# Patient Record
Sex: Female | Born: 1935 | Race: White | Hispanic: No | Smoking: Former smoker
Health system: Southern US, Community
[De-identification: ages and names within clinical notes are randomized; demographics above are authoritative.]

## PROBLEM LIST (undated history)

## (undated) DIAGNOSIS — N3010 Interstitial cystitis (chronic) without hematuria: Secondary | ICD-10-CM

## (undated) DIAGNOSIS — I482 Chronic atrial fibrillation, unspecified: Secondary | ICD-10-CM

## (undated) DIAGNOSIS — K219 Gastro-esophageal reflux disease without esophagitis: Secondary | ICD-10-CM

## (undated) DIAGNOSIS — K900 Celiac disease: Secondary | ICD-10-CM

## (undated) DIAGNOSIS — I10 Essential (primary) hypertension: Secondary | ICD-10-CM

## (undated) DIAGNOSIS — H9190 Unspecified hearing loss, unspecified ear: Secondary | ICD-10-CM

## (undated) DIAGNOSIS — K5909 Other constipation: Secondary | ICD-10-CM

## (undated) DIAGNOSIS — I5022 Chronic systolic (congestive) heart failure: Secondary | ICD-10-CM

## (undated) HISTORY — PX: OTHER SURGICAL HISTORY: SHX169

## (undated) HISTORY — PX: ROTATOR CUFF REPAIR: SHX139

## (undated) HISTORY — PX: ABDOMINAL HYSTERECTOMY: SHX81

## (undated) HISTORY — PX: TONSILLECTOMY: SUR1361

## (undated) HISTORY — PX: CHOLECYSTECTOMY: SHX55

## (undated) HISTORY — PX: APPENDECTOMY: SHX54

## (undated) HISTORY — PX: REPLACEMENT TOTAL KNEE: SUR1224

---

## 1997-08-27 ENCOUNTER — Ambulatory Visit (HOSPITAL_COMMUNITY): Admission: RE | Admit: 1997-08-27 | Discharge: 1997-08-27 | Payer: Self-pay | Admitting: Internal Medicine

## 1998-07-10 ENCOUNTER — Ambulatory Visit (HOSPITAL_COMMUNITY): Admission: RE | Admit: 1998-07-10 | Discharge: 1998-07-10 | Payer: Self-pay | Admitting: Obstetrics & Gynecology

## 1998-11-26 ENCOUNTER — Ambulatory Visit (HOSPITAL_COMMUNITY): Admission: RE | Admit: 1998-11-26 | Discharge: 1998-11-26 | Payer: Self-pay | Admitting: Obstetrics and Gynecology

## 1998-11-26 ENCOUNTER — Encounter: Payer: Self-pay | Admitting: Obstetrics and Gynecology

## 1999-12-09 ENCOUNTER — Encounter: Payer: Self-pay | Admitting: Internal Medicine

## 1999-12-09 ENCOUNTER — Ambulatory Visit (HOSPITAL_COMMUNITY): Admission: RE | Admit: 1999-12-09 | Discharge: 1999-12-09 | Payer: Self-pay | Admitting: Internal Medicine

## 2000-12-28 ENCOUNTER — Other Ambulatory Visit: Admission: RE | Admit: 2000-12-28 | Discharge: 2000-12-28 | Payer: Self-pay | Admitting: *Deleted

## 2012-05-01 ENCOUNTER — Emergency Department (HOSPITAL_BASED_OUTPATIENT_CLINIC_OR_DEPARTMENT_OTHER)
Admission: EM | Admit: 2012-05-01 | Discharge: 2012-05-01 | Disposition: A | Discharge status: Home / Self Care | Payer: Medicare Other | Attending: Emergency Medicine | Admitting: Emergency Medicine

## 2012-05-01 ENCOUNTER — Encounter (HOSPITAL_BASED_OUTPATIENT_CLINIC_OR_DEPARTMENT_OTHER): Payer: Self-pay | Admitting: *Deleted

## 2012-05-01 ENCOUNTER — Emergency Department (HOSPITAL_BASED_OUTPATIENT_CLINIC_OR_DEPARTMENT_OTHER): Payer: Medicare Other

## 2012-05-01 DIAGNOSIS — S82009A Unspecified fracture of unspecified patella, initial encounter for closed fracture: Secondary | ICD-10-CM | POA: Insufficient documentation

## 2012-05-01 DIAGNOSIS — W010XXA Fall on same level from slipping, tripping and stumbling without subsequent striking against object, initial encounter: Secondary | ICD-10-CM | POA: Insufficient documentation

## 2012-05-01 DIAGNOSIS — Y929 Unspecified place or not applicable: Secondary | ICD-10-CM | POA: Insufficient documentation

## 2012-05-01 DIAGNOSIS — I10 Essential (primary) hypertension: Secondary | ICD-10-CM | POA: Insufficient documentation

## 2012-05-01 DIAGNOSIS — Z7901 Long term (current) use of anticoagulants: Secondary | ICD-10-CM | POA: Insufficient documentation

## 2012-05-01 DIAGNOSIS — E119 Type 2 diabetes mellitus without complications: Secondary | ICD-10-CM | POA: Insufficient documentation

## 2012-05-01 DIAGNOSIS — T45515A Adverse effect of anticoagulants, initial encounter: Secondary | ICD-10-CM | POA: Insufficient documentation

## 2012-05-01 DIAGNOSIS — IMO0002 Reserved for concepts with insufficient information to code with codable children: Secondary | ICD-10-CM

## 2012-05-01 DIAGNOSIS — Z79899 Other long term (current) drug therapy: Secondary | ICD-10-CM | POA: Insufficient documentation

## 2012-05-01 DIAGNOSIS — M25469 Effusion, unspecified knee: Secondary | ICD-10-CM | POA: Insufficient documentation

## 2012-05-01 DIAGNOSIS — Z96659 Presence of unspecified artificial knee joint: Secondary | ICD-10-CM | POA: Insufficient documentation

## 2012-05-01 DIAGNOSIS — M2540 Effusion, unspecified joint: Secondary | ICD-10-CM

## 2012-05-01 DIAGNOSIS — D689 Coagulation defect, unspecified: Secondary | ICD-10-CM | POA: Insufficient documentation

## 2012-05-01 DIAGNOSIS — D6832 Hemorrhagic disorder due to extrinsic circulating anticoagulants: Secondary | ICD-10-CM

## 2012-05-01 DIAGNOSIS — Y939 Activity, unspecified: Secondary | ICD-10-CM | POA: Insufficient documentation

## 2012-05-01 DIAGNOSIS — K219 Gastro-esophageal reflux disease without esophagitis: Secondary | ICD-10-CM | POA: Insufficient documentation

## 2012-05-01 HISTORY — DX: Essential (primary) hypertension: I10

## 2012-05-01 HISTORY — DX: Gastro-esophageal reflux disease without esophagitis: K21.9

## 2012-05-01 LAB — PROTIME-INR
INR: 2.21 — ABNORMAL HIGH (ref 0.00–1.49)
Prothrombin Time: 23.6 seconds — ABNORMAL HIGH (ref 11.6–15.2)

## 2012-05-01 LAB — CBC
HCT: 34.3 % — ABNORMAL LOW (ref 36.0–46.0)
Hemoglobin: 11.6 g/dL — ABNORMAL LOW (ref 12.0–15.0)
MCH: 30 pg (ref 26.0–34.0)
MCHC: 33.8 g/dL (ref 30.0–36.0)
MCV: 88.6 fL (ref 78.0–100.0)
Platelets: 195 10*3/uL (ref 150–400)
RBC: 3.87 MIL/uL (ref 3.87–5.11)
RDW: 14.4 % (ref 11.5–15.5)
WBC: 5.8 10*3/uL (ref 4.0–10.5)

## 2012-05-01 MED ORDER — OXYCODONE HCL 5 MG PO CAPS
5.0000 mg | ORAL_CAPSULE | Freq: Four times a day (QID) | ORAL | Status: DC | PRN
Start: 2012-05-01 — End: 2016-07-29

## 2012-05-01 MED ORDER — OXYCODONE-ACETAMINOPHEN 5-325 MG PO TABS
1.0000 | ORAL_TABLET | Freq: Once | ORAL | Status: AC
  Administered 2012-05-01: 1 via ORAL
  Filled 2012-05-01 (×2): qty 1

## 2012-05-01 NOTE — ED Notes (Signed)
Slipped and fell, now having left knee pain and swelling

## 2012-05-01 NOTE — ED Provider Notes (Signed)
History   This chart was scribed for Saddie Benders. Dorna Mai, MD by Adriana Reams, ED Scribe. This patient was seen in room MH12/MH12 and the patient's care was started at 1625.   CSN: [id number]  Arrival date & time 05/01/12  1532   First MD Initiated Contact with Patient 05/01/12 1625      Chief Complaint  Patient presents with  . Fall     The history is provided by the patient and a relative. No language interpreter was used.   Amy Hickman is a 77 y.o. female who presents to the Emergency Department complaining of constant, moderate, sudden onset left knee pain due to a fall which occurred earlier this AM.  It is worse with bending.  No medications were taken prior to arrival.  No LOC. She reports associated swelling and tenderness. She denies hitting her head or any other pain. 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 She has a history of HTN, DM and GERD. She has a Coumadin level recheck 4 weeks ago and has one schedules for 2012/06/06. Her husband has passed away in the past week.  Past Medical History  Diagnosis Date  . Diabetes mellitus without complication   . Hypertension   . GERD (gastroesophageal reflux disease)     Past Surgical History  Procedure Date  . Replacement total knee     History reviewed. No pertinent family history.  History  Substance Use Topics  . Smoking status: Never Smoker   . Smokeless tobacco: Not on file  . Alcohol Use: No    OB History    Grav Para Term Preterm Abortions TAB SAB Ect Mult Living                  Review of Systems  Constitutional: Negative.   Musculoskeletal: Positive for joint swelling and arthralgias (left knee).  Skin: Negative for wound.  Neurological: Negative for dizziness, syncope, weakness and light-headedness.  Hematological: Bruises/bleeds easily.    Allergies  Ativan; Ciprofloxacin hcl; Codeine; Darvon; Gluten meal; and Phenergan  Home Medications   Current Outpatient Rx  Name  Route  Sig   Dispense  Refill  . AMLODIPINE BESY-BENAZEPRIL HCL 5-10 MG PO CAPS   Oral   Take 1 capsule by mouth daily.         . CHLORDIAZEPOXIDE HCL 25 MG PO CAPS   Oral   Take 25 mg by mouth 3 (three) times daily as needed.         Marland Kitchen ESOMEPRAZOLE MAGNESIUM 40 MG PO CPDR   Oral   Take 40 mg by mouth daily before breakfast.         . LEVOTHYROXINE SODIUM 100 MCG PO TABS   Oral   Take 100 mcg by mouth daily.         Marland Kitchen LOSARTAN POTASSIUM 100 MG PO TABS   Oral   Take 100 mg by mouth daily.         Marland Kitchen METOPROLOL TARTRATE 50 MG PO TABS   Oral   Take 50 mg by mouth 2 (two) times daily.         . MOMETASONE FUROATE 50 MCG/ACT NA SUSP   Nasal   Place 2 sprays into the nose daily.         Marland Kitchen PENTOSAN POLYSULFATE SODIUM 100 MG PO CAPS   Oral   Take 100 mg by mouth 3 (three) times daily before meals.         Marland Kitchen  PRAVASTATIN SODIUM 20 MG PO TABS   Oral   Take 20 mg by mouth daily.         Marland Kitchen RISEDRONATE SODIUM 150 MG PO TABS   Oral   Take 150 mg by mouth every 30 (thirty) days. with water on empty stomach, nothing by mouth or lie down for next 30 minutes.         . TRAVOPROST (BAK FREE) 0.004 % OP SOLN      1 drop at bedtime.         . WARFARIN SODIUM 7.5 MG PO TABS   Oral   Take 7.5 mg by mouth daily.         . OXYCODONE HCL 5 MG PO CAPS   Oral   Take 1 capsule (5 mg total) by mouth every 6 (six) hours as needed.   20 capsule   0     Triage Vitals: BP 140/72  Pulse 66  Temp 97.7 F (36.5 C) (Oral)  Resp 18  SpO2 100%  Physical Exam  Nursing note and vitals reviewed. Constitutional: She is oriented to person, place, and time. She appears well-developed and well-nourished. No distress.  HENT:  Head: Normocephalic and atraumatic.  Eyes: EOM are normal.  Neck: Normal range of motion. Neck supple. No tracheal deviation present.  Cardiovascular: Normal rate.   Pulses:      Dorsalis pedis pulses are 1+ on the left side.       Posterior tibial pulses are  1+ on the left side.  Pulmonary/Chest: Effort normal. No respiratory distress.  Musculoskeletal: She exhibits edema (left knee) and tenderness.       Left knee: She exhibits decreased range of motion, swelling and effusion. tenderness found.       Diffusely tender and mild effusion to left knee. No obvious deformity. Distal strength, sensation, pulses are all normal.  Neurological: She is alert and oriented to person, place, and time.  Skin: Skin is warm and dry. She is not diaphoretic.  Psychiatric: She has a normal mood and affect. Her behavior is normal.    ED Course  Procedures (including critical care time) DIAGNOSTIC STUDIES: Oxygen Saturation is 100% on room air, normal by my interpretation.    COORDINATION OF CARE: 4:40 PM Discussed treatment plan which includes rest, an knee immobilizer, and pain medication with pt at bedside and pt agreed to plan.     Labs Reviewed  PROTIME-INR - Abnormal; Notable for the following:    Prothrombin Time 23.6 (*)     INR 2.21 (*)     All other components within normal limits  CBC - Abnormal; Notable for the following:    Hemoglobin 11.6 (*)     HCT 34.3 (*)     All other components within normal limits   Dg Knee Complete 4 Views Left  05/01/2012  *RADIOLOGY REPORT*  Clinical Data: Left knee pain secondary to a fall.  Prior total knee replacement.  LEFT KNEE - COMPLETE 4+ VIEW  Comparison: None.  Findings: There is a prominent joint effusion. On one oblique view there appears to be a small avulsion of bone from the posterior aspect lateral aspect of the metaphysis just above the prosthesis. No other visible fractures.  IMPRESSION: Joint effusion. Focal fracture of the lateral aspect of the metaphysis.   Original Report Authenticated By: Lorriane Shire, M.D.      1. Knee fracture, left   2. Effusion into joint   3. Warfarin-induced coagulopathy  Patient felt improved after oral analgesics. INR is therapeutic but on the low and at 2.21.  Patient and family are in agreement with plan to be discharged, with immobilization. She has a wheelchair at home and that she will followup with her orthopedist first thing tomorrow for reevaluation of her knee.  MDM  I personally performed the services described in this documentation, which was scribed in my presence. The recorded information has been reviewed and considered.   Pt with direct force to left knee from mechanical fall.  Effusion noted, pt is on coumadin.  Limited ROM due to pain.  Will check Hgb and INR.  Will compress with ACE wrap, immobilizer, refer to follow up with her orthopedist on Monday for a recheck.  Would recommend stopping coumadin for now until re-evaluation in case arthrocentesis would be needed.          Saddie Benders. Marissa Lowrey, MD 05/01/12 2311

## 2012-06-24 ENCOUNTER — Emergency Department (HOSPITAL_BASED_OUTPATIENT_CLINIC_OR_DEPARTMENT_OTHER): Payer: Medicare Other

## 2012-06-24 ENCOUNTER — Encounter (HOSPITAL_BASED_OUTPATIENT_CLINIC_OR_DEPARTMENT_OTHER): Payer: Self-pay | Admitting: *Deleted

## 2012-06-24 ENCOUNTER — Emergency Department (HOSPITAL_BASED_OUTPATIENT_CLINIC_OR_DEPARTMENT_OTHER)
Admission: EM | Admit: 2012-06-24 | Discharge: 2012-06-24 | Disposition: A | Discharge status: Home / Self Care | Payer: Medicare Other | Attending: Emergency Medicine | Admitting: Emergency Medicine

## 2012-06-24 DIAGNOSIS — Z86718 Personal history of other venous thrombosis and embolism: Secondary | ICD-10-CM | POA: Insufficient documentation

## 2012-06-24 DIAGNOSIS — M25522 Pain in left elbow: Secondary | ICD-10-CM

## 2012-06-24 DIAGNOSIS — K219 Gastro-esophageal reflux disease without esophagitis: Secondary | ICD-10-CM | POA: Insufficient documentation

## 2012-06-24 DIAGNOSIS — E119 Type 2 diabetes mellitus without complications: Secondary | ICD-10-CM | POA: Insufficient documentation

## 2012-06-24 DIAGNOSIS — I10 Essential (primary) hypertension: Secondary | ICD-10-CM | POA: Insufficient documentation

## 2012-06-24 DIAGNOSIS — Z79899 Other long term (current) drug therapy: Secondary | ICD-10-CM | POA: Insufficient documentation

## 2012-06-24 DIAGNOSIS — M25529 Pain in unspecified elbow: Secondary | ICD-10-CM | POA: Insufficient documentation

## 2012-06-24 DIAGNOSIS — Z86711 Personal history of pulmonary embolism: Secondary | ICD-10-CM | POA: Insufficient documentation

## 2012-06-24 DIAGNOSIS — Z7901 Long term (current) use of anticoagulants: Secondary | ICD-10-CM | POA: Insufficient documentation

## 2012-06-24 LAB — CBC WITH DIFFERENTIAL/PLATELET
Basophils Absolute: 0 10*3/uL (ref 0.0–0.1)
HCT: 34.3 % — ABNORMAL LOW (ref 36.0–46.0)
Lymphocytes Relative: 29 % (ref 12–46)
Monocytes Absolute: 0.6 10*3/uL (ref 0.1–1.0)
Neutro Abs: 2.6 10*3/uL (ref 1.7–7.7)
RBC: 3.94 MIL/uL (ref 3.87–5.11)
RDW: 14.4 % (ref 11.5–15.5)
WBC: 4.7 10*3/uL (ref 4.0–10.5)

## 2012-06-24 LAB — PROTIME-INR
INR: 1.99 — ABNORMAL HIGH (ref 0.00–1.49)
Prothrombin Time: 21.8 seconds — ABNORMAL HIGH (ref 11.6–15.2)

## 2012-06-24 LAB — BASIC METABOLIC PANEL
CO2: 23 mEq/L (ref 19–32)
Chloride: 96 mEq/L (ref 96–112)
Creatinine, Ser: 0.8 mg/dL (ref 0.50–1.10)
Sodium: 130 mEq/L — ABNORMAL LOW (ref 135–145)

## 2012-06-24 MED ORDER — HYDROMORPHONE HCL PF 1 MG/ML IJ SOLN
1.0000 mg | Freq: Once | INTRAMUSCULAR | Status: AC
  Administered 2012-06-24: 1 mg via INTRAMUSCULAR
  Filled 2012-06-24: qty 1

## 2012-06-24 MED ORDER — ONDANSETRON HCL 4 MG/2ML IJ SOLN
INTRAMUSCULAR | Status: AC
  Administered 2012-06-24: 4 mg
  Filled 2012-06-24: qty 2

## 2012-06-24 MED ORDER — ACETAMINOPHEN 500 MG PO TABS: 500.0000 mg | ORAL_TABLET | Freq: Four times a day (QID) | ORAL | Status: AC | PRN

## 2012-06-24 MED ORDER — ONDANSETRON HCL 4 MG/2ML IJ SOLN
4.0000 mg | Freq: Once | INTRAMUSCULAR | Status: AC
  Administered 2012-06-24: 4 mg via INTRAVENOUS
  Filled 2012-06-24: qty 2

## 2012-06-24 NOTE — ED Notes (Signed)
MD at bedside. 

## 2012-06-24 NOTE — ED Provider Notes (Signed)
History     CSN: [id number]  Arrival date & time 06/24/12  1007   First MD Initiated Contact with Patient 06/24/12 1039      Chief Complaint  Patient presents with  . Elbow Pain    (Consider location/radiation/quality/duration/timing/severity/associated sxs/prior treatment) HPI The patient presents with acute onset of pain in her left elbow.  She recalls going to sleep last night in her usual state of health.  She awoke, approximately 2 hours prior to arrival with pain focally about the left anterior elbow.  The pain is worse with motion.  There is associated distal dysesthesia, but no weakness.  The pain is worse with wrist motion, elbow or motion. No new shoulder pain, no other new focal complaints. The patient's that she has been compliant indication.  Initially the patient denied other significant medical issues, but on my repeat evaluation the patient states that she has a history of DVT/PE. Past Medical History  Diagnosis Date  . Diabetes mellitus without complication   . Hypertension   . GERD (gastroesophageal reflux disease)     Past Surgical History  Procedure Laterality Date  . Replacement total knee      History reviewed. No pertinent family history.  History  Substance Use Topics  . Smoking status: Never Smoker   . Smokeless tobacco: Not on file  . Alcohol Use: No    OB History   Grav Para Term Preterm Abortions TAB SAB Ect Mult Living                  Review of Systems  Constitutional:       Per HPI, otherwise negative  HENT:       Per HPI, otherwise negative  Respiratory:       Per HPI, otherwise negative  Cardiovascular:       Per HPI, otherwise negative  Gastrointestinal: Negative for vomiting.  Endocrine:       Negative aside from HPI  Genitourinary:       Neg aside from HPI   Musculoskeletal:       Per HPI, otherwise negative  Skin: Negative.   Neurological: Positive for numbness. Negative for syncope, weakness and headaches.     Allergies  Ativan; Ciprofloxacin hcl; Codeine; Darvon; Gluten meal; and Phenergan  Home Medications   Current Outpatient Rx  Name  Route  Sig  Dispense  Refill  . amLODipine-benazepril (LOTREL) 5-10 MG per capsule   Oral   Take 1 capsule by mouth daily.         . chlordiazePOXIDE (LIBRIUM) 25 MG capsule   Oral   Take 25 mg by mouth 3 (three) times daily as needed.         Marland Kitchen esomeprazole (NEXIUM) 40 MG capsule   Oral   Take 40 mg by mouth daily before breakfast.         . levothyroxine (SYNTHROID, LEVOTHROID) 100 MCG tablet   Oral   Take 100 mcg by mouth daily.         Marland Kitchen losartan (COZAAR) 100 MG tablet   Oral   Take 100 mg by mouth daily.         . metoprolol (LOPRESSOR) 50 MG tablet   Oral   Take 50 mg by mouth 2 (two) times daily.         . mometasone (NASONEX) 50 MCG/ACT nasal spray   Nasal   Place 2 sprays into the nose daily.         Marland Kitchen  oxycodone (OXY-IR) 5 MG capsule   Oral   Take 1 capsule (5 mg total) by mouth every 6 (six) hours as needed.   20 capsule   0   . pentosan polysulfate (ELMIRON) 100 MG capsule   Oral   Take 100 mg by mouth 3 (three) times daily before meals.         . pravastatin (PRAVACHOL) 20 MG tablet   Oral   Take 20 mg by mouth daily.         . risedronate (ACTONEL) 150 MG tablet   Oral   Take 150 mg by mouth every 30 (thirty) days. with water on empty stomach, nothing by mouth or lie down for next 30 minutes.         . Travoprost, BAK Free, (TRAVATAN) 0.004 % SOLN ophthalmic solution      1 drop at bedtime.         Marland Kitchen warfarin (COUMADIN) 7.5 MG tablet   Oral   Take 7.5 mg by mouth daily.           SpO2 98%  Physical Exam  Nursing note and vitals reviewed. Constitutional: She is oriented to person, place, and time. She appears well-developed and well-nourished. No distress.  HENT:  Head: Normocephalic and atraumatic.  Eyes: Conjunctivae and EOM are normal.  Cardiovascular: Normal rate and  regular rhythm.   Pulmonary/Chest: Effort normal and breath sounds normal. No stridor. No respiratory distress.  Abdominal: She exhibits no distension.  Musculoskeletal: She exhibits no edema.       Left shoulder: Normal.       Left wrist: Normal.       Arms: Neurological: She is alert and oriented to person, place, and time. No cranial nerve deficit.  Skin: Skin is warm and dry.  Psychiatric: She has a normal mood and affect.    ED Course  Procedures (including critical care time)  Labs Reviewed  CBC WITH DIFFERENTIAL  BASIC METABOLIC PANEL  PROTIME-INR   Dg Elbow Complete Left  06/24/2012  *RADIOLOGY REPORT*  Clinical Data: Awoke with lateral left elbow pain, no known injury  LEFT ELBOW - COMPLETE 3+ VIEW  Comparison: None  Findings: Mild osseous demineralization. Joint spaces preserved. No acute fracture, dislocation or bone destruction. No elbow joint effusion or definite soft tissue abnormality.  IMPRESSION: No acute osseous abnormalities.   Original Report Authenticated By: Lavonia Dana, M.D.    X-ray interpreted by me.  No diagnosis found.  Upon return from x-ray the patient had an episode of emesis.  Zofran provided.  Update: patient w no new complaints.  I discussed all results and the need to follow up with her orthopedist promptly.  We also discussed return precautions.  MDM  This patient presents with new left elbow pain.  The patient has some subjective sensory changes, but objectively is neurovascularly intact throughout her left upper extremity.  The patient's evaluation does not demonstrate additional fracture or DVT.  Absent warmth, fever, and with no restricted range of motion, there is low suspicion for septic arthropathy.  Etiology the patient's pain is unclear, and requires further evaluation, but she is appropriate to do this as an outpatient.  I discussed this with patient and her son prior to discharge.        [name], MD 06/24/12 [phone]

## 2012-06-24 NOTE — ED Notes (Signed)
Patient transported to Ultrasound 

## 2012-06-24 NOTE — ED Notes (Signed)
Woke up with pain in left elbow also states her hand and fingers are tingling hand is warm pink and dry pulses felt pt denies any known injury

## 2012-06-24 NOTE — ED Notes (Signed)
Pt returned to ED room 4 and vomiting.

## 2012-06-24 NOTE — ED Notes (Signed)
Family at bedside. 

## 2012-10-20 ENCOUNTER — Emergency Department (HOSPITAL_BASED_OUTPATIENT_CLINIC_OR_DEPARTMENT_OTHER)
Admission: EM | Admit: 2012-10-20 | Discharge: 2012-10-20 | Disposition: A | Discharge status: Home / Self Care | Payer: Medicare Other | Attending: Emergency Medicine | Admitting: Emergency Medicine

## 2012-10-20 ENCOUNTER — Encounter (HOSPITAL_BASED_OUTPATIENT_CLINIC_OR_DEPARTMENT_OTHER): Payer: Self-pay | Admitting: *Deleted

## 2012-10-20 ENCOUNTER — Emergency Department (HOSPITAL_BASED_OUTPATIENT_CLINIC_OR_DEPARTMENT_OTHER): Payer: Medicare Other

## 2012-10-20 DIAGNOSIS — Y929 Unspecified place or not applicable: Secondary | ICD-10-CM | POA: Insufficient documentation

## 2012-10-20 DIAGNOSIS — X500XXA Overexertion from strenuous movement or load, initial encounter: Secondary | ICD-10-CM | POA: Insufficient documentation

## 2012-10-20 DIAGNOSIS — S93402A Sprain of unspecified ligament of left ankle, initial encounter: Secondary | ICD-10-CM

## 2012-10-20 DIAGNOSIS — S93409A Sprain of unspecified ligament of unspecified ankle, initial encounter: Secondary | ICD-10-CM | POA: Insufficient documentation

## 2012-10-20 DIAGNOSIS — K219 Gastro-esophageal reflux disease without esophagitis: Secondary | ICD-10-CM | POA: Insufficient documentation

## 2012-10-20 DIAGNOSIS — Y939 Activity, unspecified: Secondary | ICD-10-CM | POA: Insufficient documentation

## 2012-10-20 DIAGNOSIS — Z7901 Long term (current) use of anticoagulants: Secondary | ICD-10-CM | POA: Insufficient documentation

## 2012-10-20 DIAGNOSIS — I10 Essential (primary) hypertension: Secondary | ICD-10-CM | POA: Insufficient documentation

## 2012-10-20 DIAGNOSIS — E119 Type 2 diabetes mellitus without complications: Secondary | ICD-10-CM | POA: Insufficient documentation

## 2012-10-20 DIAGNOSIS — Z79899 Other long term (current) drug therapy: Secondary | ICD-10-CM | POA: Insufficient documentation

## 2012-10-20 MED ORDER — OXYCODONE-ACETAMINOPHEN 5-325 MG PO TABS: 1.0000 | ORAL_TABLET | ORAL | Status: DC | PRN

## 2012-10-20 MED ORDER — OXYCODONE-ACETAMINOPHEN 5-325 MG PO TABS
2.0000 | ORAL_TABLET | Freq: Once | ORAL | Status: AC
  Administered 2012-10-20: 2 via ORAL
  Filled 2012-10-20 (×2): qty 2

## 2012-10-20 NOTE — ED Notes (Signed)
Pt reports left ankle pain that began tonight, pt denies any known injury to area. No contusion, swelling, or deformity noted on assessment - CMS intact.

## 2012-10-20 NOTE — ED Notes (Signed)
Left ankle pain and swelling. Sudden onset. No known injury.

## 2012-10-20 NOTE — ED Provider Notes (Signed)
History    CSN: [id number] Arrival date & time 10/20/12  2133  First MD Initiated Contact with Patient 10/20/12 2215     Chief Complaint  Patient presents with  . Ankle Pain   (Consider location/radiation/quality/duration/timing/severity/associated sxs/prior Treatment) HPI  Patient complaining of left ankle pain began tonight when she stepped on ankle.  Patient states she had no definite twisting or fall, she did not hear any cracking.  Pain is worse on lateral aspect.  Patient unable to bear weight.  She has had increased swelling over the past three hours.  She took did not take anything for pain.  Pain is severe.  Patient is on coumadin but it has been stopped for the past week for an egd and just restarted last night.  She is on lovenox.   Past Medical History  Diagnosis Date  . Diabetes mellitus without complication   . Hypertension   . GERD (gastroesophageal reflux disease)    Past Surgical History  Procedure Laterality Date  . Replacement total knee     No family history on file. History  Substance Use Topics  . Smoking status: Never Smoker   . Smokeless tobacco: Not on file  . Alcohol Use: No   OB History   Grav Para Term Preterm Abortions TAB SAB Ect Mult Living                 Review of Systems  All other systems reviewed and are negative.    Allergies  Ativan; Ciprofloxacin hcl; Codeine; Darvon; Gluten meal; and Phenergan  Home Medications   Current Outpatient Rx  Name  Route  Sig  Dispense  Refill  . acetaminophen (TYLENOL) 500 MG tablet   Oral   Take 1 tablet (500 mg total) by mouth every 6 (six) hours as needed for pain.   30 tablet   0   . amLODipine-benazepril (LOTREL) 5-10 MG per capsule   Oral   Take 1 capsule by mouth daily.         . chlordiazePOXIDE (LIBRIUM) 25 MG capsule   Oral   Take 25 mg by mouth 3 (three) times daily as needed.         Marland Kitchen esomeprazole (NEXIUM) 40 MG capsule   Oral   Take 40 mg by mouth daily before  breakfast.         . levothyroxine (SYNTHROID, LEVOTHROID) 100 MCG tablet   Oral   Take 100 mcg by mouth daily.         Marland Kitchen losartan (COZAAR) 100 MG tablet   Oral   Take 100 mg by mouth daily.         . metoprolol (LOPRESSOR) 50 MG tablet   Oral   Take 50 mg by mouth 2 (two) times daily.         . mometasone (NASONEX) 50 MCG/ACT nasal spray   Nasal   Place 2 sprays into the nose daily.         Marland Kitchen oxycodone (OXY-IR) 5 MG capsule   Oral   Take 1 capsule (5 mg total) by mouth every 6 (six) hours as needed.   20 capsule   0   . pentosan polysulfate (ELMIRON) 100 MG capsule   Oral   Take 100 mg by mouth 3 (three) times daily before meals.         . pravastatin (PRAVACHOL) 20 MG tablet   Oral   Take 20 mg by mouth daily.         Marland Kitchen  risedronate (ACTONEL) 150 MG tablet   Oral   Take 150 mg by mouth every 30 (thirty) days. with water on empty stomach, nothing by mouth or lie down for next 30 minutes.         . Travoprost, BAK Free, (TRAVATAN) 0.004 % SOLN ophthalmic solution      1 drop at bedtime.         Marland Kitchen warfarin (COUMADIN) 7.5 MG tablet   Oral   Take 7.5 mg by mouth daily.          BP 141/74  Pulse 72  Temp(Src) 98 F (36.7 C) (Oral)  Resp 20  Ht 5' 2"  (1.575 m)  Wt 159 lb (72.122 kg)  BMI 29.07 kg/m2  SpO2 99% Physical Exam  Nursing note and vitals reviewed. Constitutional: She is oriented to person, place, and time. She appears well-developed and well-nourished.  HENT:  Head: Normocephalic and atraumatic.  Musculoskeletal:  Right ankle is diffusely tender with palpation , some mild lateral swelling.  No warmth,  Full arom.  Knee nontender dp intact .  Toes pink cap refill <2 sec  Neurological: She is alert and oriented to person, place, and time.  Skin: Skin is warm and dry.  Psychiatric: Her behavior is normal. Judgment and thought content normal.    ED Course  Procedures (including critical care time) Labs Reviewed - No data to  display No results found. No diagnosis found.  MDM  Dg Ankle Complete Left  10/20/2012   *RADIOLOGY REPORT*  Clinical Data: Ankle pain.  LEFT ANKLE COMPLETE - 3+ VIEW  Comparison: None.  Findings: Old post-traumatic changes are present adjacent to the navicular bone.  The ankle mortise is congruent.  Talar dome appears intact.  Osteopenia.  Visualized metatarsal bases appear normal.  The obliquity is present on the lateral view, producing an unusual appearance of the subtalar joints.  The appearance is probably due to obliquity rather than coalition.  IMPRESSION: No acute osseous abnormality.   Original Report Authenticated By: Dereck Ligas, M.D.  No evidence of acute fracture seen on x-Ashlea Dusing. Patient has walker at home. We have discussed that she should only weight bear as tolerated. She has a Doctor, general practice in [facility]. We'll also discussed that she should followup with her orthopedic surgeon if this is not improved in 2-3 days. Return precautions are discussed with the patient and the son and they voiced agreement. Patient will ice and elevate foot when she is at home. She is given 2 Percocet here and a prescription for 6 Percocet pills.  Shaune Pollack, MD 10/20/12 2240

## 2013-11-18 ENCOUNTER — Emergency Department (HOSPITAL_BASED_OUTPATIENT_CLINIC_OR_DEPARTMENT_OTHER): Payer: Medicare Other

## 2013-11-18 ENCOUNTER — Encounter (HOSPITAL_BASED_OUTPATIENT_CLINIC_OR_DEPARTMENT_OTHER): Payer: Self-pay | Admitting: Emergency Medicine

## 2013-11-18 ENCOUNTER — Emergency Department (HOSPITAL_BASED_OUTPATIENT_CLINIC_OR_DEPARTMENT_OTHER)
Admission: EM | Admit: 2013-11-18 | Discharge: 2013-11-18 | Disposition: A | Discharge status: Home / Self Care | Payer: Medicare Other | Attending: Emergency Medicine | Admitting: Emergency Medicine

## 2013-11-18 DIAGNOSIS — R05 Cough: Secondary | ICD-10-CM | POA: Diagnosis present

## 2013-11-18 DIAGNOSIS — E119 Type 2 diabetes mellitus without complications: Secondary | ICD-10-CM | POA: Diagnosis not present

## 2013-11-18 DIAGNOSIS — IMO0002 Reserved for concepts with insufficient information to code with codable children: Secondary | ICD-10-CM | POA: Diagnosis not present

## 2013-11-18 DIAGNOSIS — Z7901 Long term (current) use of anticoagulants: Secondary | ICD-10-CM | POA: Diagnosis not present

## 2013-11-18 DIAGNOSIS — J209 Acute bronchitis, unspecified: Secondary | ICD-10-CM | POA: Diagnosis not present

## 2013-11-18 DIAGNOSIS — R059 Cough, unspecified: Secondary | ICD-10-CM | POA: Diagnosis present

## 2013-11-18 DIAGNOSIS — I10 Essential (primary) hypertension: Secondary | ICD-10-CM | POA: Insufficient documentation

## 2013-11-18 DIAGNOSIS — R5381 Other malaise: Secondary | ICD-10-CM | POA: Insufficient documentation

## 2013-11-18 DIAGNOSIS — K219 Gastro-esophageal reflux disease without esophagitis: Secondary | ICD-10-CM | POA: Insufficient documentation

## 2013-11-18 DIAGNOSIS — R5383 Other fatigue: Secondary | ICD-10-CM

## 2013-11-18 DIAGNOSIS — J40 Bronchitis, not specified as acute or chronic: Secondary | ICD-10-CM

## 2013-11-18 DIAGNOSIS — Z79899 Other long term (current) drug therapy: Secondary | ICD-10-CM | POA: Diagnosis not present

## 2013-11-18 MED ORDER — BENZONATATE 100 MG PO CAPS: 100.0000 mg | ORAL_CAPSULE | Freq: Three times a day (TID) | ORAL | Status: DC | PRN

## 2013-11-18 MED ORDER — DOXYCYCLINE HYCLATE 100 MG PO CAPS: 100.0000 mg | ORAL_CAPSULE | Freq: Two times a day (BID) | ORAL | Status: DC

## 2013-11-18 NOTE — Discharge Instructions (Signed)
Upper Respiratory Infection, Adult An upper respiratory infection (URI) is also sometimes known as the common cold. The upper respiratory tract includes the nose, sinuses, throat, trachea, and bronchi. Bronchi are the airways leading to the lungs. Most people improve within 1 week, but symptoms can last up to 2 weeks. A residual cough may last even longer.  CAUSES Many different viruses can infect the tissues lining the upper respiratory tract. The tissues become irritated and inflamed and often become very moist. Mucus production is also common. A cold is contagious. You can easily spread the virus to others by oral contact. This includes kissing, sharing a glass, coughing, or sneezing. Touching your mouth or nose and then touching a surface, which is then touched by another person, can also spread the virus. SYMPTOMS  Symptoms typically develop 1 to 3 days after you come in contact with a cold virus. Symptoms vary from person to person. They may include:  Runny nose.  Sneezing.  Nasal congestion.  Sinus irritation.  Sore throat.  Loss of voice (laryngitis).  Cough.  Fatigue.  Muscle aches.  Loss of appetite.  Headache.  Low-grade fever. DIAGNOSIS  You might diagnose your own cold based on familiar symptoms, since most people get a cold 2 to 3 times a year. Your caregiver can confirm this based on your exam. Most importantly, your caregiver can check that your symptoms are not due to another disease such as strep throat, sinusitis, pneumonia, asthma, or epiglottitis. Blood tests, throat tests, and X-rays are not necessary to diagnose a common cold, but they may sometimes be helpful in excluding other more serious diseases. Your caregiver will decide if any further tests are required. RISKS AND COMPLICATIONS  You may be at risk for a more severe case of the common cold if you smoke cigarettes, have chronic heart disease (such as heart failure) or lung disease (such as asthma), or if  you have a weakened immune system. The very young and very old are also at risk for more serious infections. Bacterial sinusitis, middle ear infections, and bacterial pneumonia can complicate the common cold. The common cold can worsen asthma and chronic obstructive pulmonary disease (COPD). Sometimes, these complications can require emergency medical care and may be life-threatening. PREVENTION  The best way to protect against getting a cold is to practice good hygiene. Avoid oral or hand contact with people with cold symptoms. Wash your hands often if contact occurs. There is no clear evidence that vitamin C, vitamin E, echinacea, or exercise reduces the chance of developing a cold. However, it is always recommended to get plenty of rest and practice good nutrition. TREATMENT  Treatment is directed at relieving symptoms. There is no cure. Antibiotics are not effective, because the infection is caused by a virus, not by bacteria. Treatment may include:  Increased fluid intake. Sports drinks offer valuable electrolytes, sugars, and fluids.  Breathing heated mist or steam (vaporizer or shower).  Eating chicken soup or other clear broths, and maintaining good nutrition.  Getting plenty of rest.  Using gargles or lozenges for comfort.  Controlling fevers with ibuprofen or acetaminophen as directed by your caregiver.  Increasing usage of your inhaler if you have asthma. Zinc gel and zinc lozenges, taken in the first 24 hours of the common cold, can shorten the duration and lessen the severity of symptoms. Pain medicines may help with fever, muscle aches, and throat pain. A variety of non-prescription medicines are available to treat congestion and runny nose. Your caregiver   can make recommendations and may suggest nasal or lung inhalers for other symptoms.  HOME CARE INSTRUCTIONS   Only take over-the-counter or prescription medicines for pain, discomfort, or fever as directed by your  caregiver.  Use a warm mist humidifier or inhale steam from a shower to increase air moisture. This may keep secretions moist and make it easier to breathe.  Drink enough water and fluids to keep your urine clear or pale yellow.  Rest as needed.  Return to work when your temperature has returned to normal or as your caregiver advises. You may need to stay home longer to avoid infecting others. You can also use a face mask and careful hand washing to prevent spread of the virus. SEEK MEDICAL CARE IF:   After the first few days, you feel you are getting worse rather than better.  You need your caregiver's advice about medicines to control symptoms.  You develop chills, worsening shortness of breath, or brown or red sputum. These may be signs of pneumonia.  You develop yellow or brown nasal discharge or pain in the face, especially when you bend forward. These may be signs of sinusitis.  You develop a fever, swollen neck glands, pain with swallowing, or white areas in the back of your throat. These may be signs of strep throat. SEEK IMMEDIATE MEDICAL CARE IF:   You have a fever.  You develop severe or persistent headache, ear pain, sinus pain, or chest pain.  You develop wheezing, a prolonged cough, cough up blood, or have a change in your usual mucus (if you have chronic lung disease).  You develop sore muscles or a stiff neck. Document Released: 09/30/2000 Document Revised: 06/29/2011 Document Reviewed: 07/12/2013 [employer] Patient Information 2015 [employer]. This information is not intended to replace advice given to you by your health care provider. Make sure you discuss any questions you have with your health care provider.  

## 2013-11-18 NOTE — ED Notes (Signed)
Patient has a cough and sore throat, started 4 days ago.

## 2013-11-18 NOTE — ED Provider Notes (Signed)
CSN: [id number]     Arrival date & time 11/18/13  1287 History   First MD Initiated Contact with Patient 11/18/13 [phone]     Chief Complaint  Patient presents with  . Cough     (Consider location/radiation/quality/duration/timing/severity/associated sxs/prior Treatment) HPI Comments: Patient presents with cough. She has a 4 to five-day history of worsening cough and chest congestion. She states her cough is nonproductive but she feels congested in her chest. She also has some runny nose, postnasal drip and sore throat. She denies any fevers or chills. She feels generally fatigued but denies any shortness of breath or chest pain. She denies any vomiting or diarrhea. She's been taking Delsym and Mucinex without improvement in symptoms. She is on Coumadin but has recently stopped the Coumadin and to do a upcoming colonoscopy procedure. She's currently taking lovenox shots.  Patient is a 78 y.o. female presenting with cough.  Cough Associated symptoms: sore throat   Associated symptoms: no chest pain, no chills, no diaphoresis, no fever, no headaches, no rash, no rhinorrhea and no shortness of breath     Past Medical History  Diagnosis Date  . Diabetes mellitus without complication   . Hypertension   . GERD (gastroesophageal reflux disease)    Past Surgical History  Procedure Laterality Date  . Replacement total knee     No family history on file. History  Substance Use Topics  . Smoking status: Never Smoker   . Smokeless tobacco: Not on file  . Alcohol Use: No   OB History   Grav Para Term Preterm Abortions TAB SAB Ect Mult Living                 Review of Systems  Constitutional: Positive for fatigue. Negative for fever, chills and diaphoresis.  HENT: Positive for congestion, postnasal drip and sore throat. Negative for rhinorrhea and sneezing.   Eyes: Negative.   Respiratory: Positive for cough. Negative for chest tightness and shortness of breath.   Cardiovascular: Negative  for chest pain and leg swelling.  Gastrointestinal: Negative for nausea, vomiting, abdominal pain, diarrhea and blood in stool.  Genitourinary: Negative for frequency, hematuria, flank pain and difficulty urinating.  Musculoskeletal: Negative for arthralgias and back pain.  Skin: Negative for rash.  Neurological: Negative for dizziness, speech difficulty, weakness, numbness and headaches.      Allergies  Ativan; Ciprofloxacin hcl; Codeine; Darvon; Gluten meal; and Phenergan  Home Medications   Prior to Admission medications   Medication Sig Start Date End Date Taking? Authorizing Provider  acetaminophen (TYLENOL) 500 MG tablet Take 1 tablet (500 mg total) by mouth every 6 (six) hours as needed for pain. 06/24/12   [name], MD  amLODipine-benazepril (LOTREL) 5-10 MG per capsule Take 1 capsule by mouth daily.    Historical Provider, MD  benzonatate (TESSALON PERLES) 100 MG capsule Take 1 capsule (100 mg total) by mouth 3 (three) times daily as needed for cough. 11/18/13   [name], MD  chlordiazePOXIDE (LIBRIUM) 25 MG capsule Take 25 mg by mouth 3 (three) times daily as needed.    Historical Provider, MD  doxycycline (VIBRAMYCIN) 100 MG capsule Take 1 capsule (100 mg total) by mouth 2 (two) times daily. One po bid x 7 days 11/18/13   [name], MD  esomeprazole (NEXIUM) 40 MG capsule Take 40 mg by mouth daily before breakfast.    Historical Provider, MD  levothyroxine (SYNTHROID, LEVOTHROID) 100 MCG tablet Take 100 mcg by mouth daily.    Historical  Provider, MD  losartan (COZAAR) 100 MG tablet Take 100 mg by mouth daily.    Historical Provider, MD  metoprolol (LOPRESSOR) 50 MG tablet Take 50 mg by mouth 2 (two) times daily.    Historical Provider, MD  mometasone (NASONEX) 50 MCG/ACT nasal spray Place 2 sprays into the nose daily.    Historical Provider, MD  oxycodone (OXY-IR) 5 MG capsule Take 1 capsule (5 mg total) by mouth every 6 (six) hours as needed. 05/01/12   Saddie Benders.  Ghim, MD  oxyCODONE-acetaminophen (PERCOCET/ROXICET) 5-325 MG per tablet Take 1 tablet by mouth every 4 (four) hours as needed for pain. 10/20/12   Shaune Pollack, MD  pentosan polysulfate (ELMIRON) 100 MG capsule Take 100 mg by mouth 3 (three) times daily before meals.    Historical Provider, MD  pravastatin (PRAVACHOL) 20 MG tablet Take 20 mg by mouth daily.    Historical Provider, MD  risedronate (ACTONEL) 150 MG tablet Take 150 mg by mouth every 30 (thirty) days. with water on empty stomach, nothing by mouth or lie down for next 30 minutes.    Historical Provider, MD  Travoprost, BAK Free, (TRAVATAN) 0.004 % SOLN ophthalmic solution 1 drop at bedtime.    Historical Provider, MD  warfarin (COUMADIN) 7.5 MG tablet Take 7.5 mg by mouth daily.    Historical Provider, MD   BP 148/81  Pulse 81  Temp(Src) 97.8 F (36.6 C) (Oral)  Resp 24  Ht 5' 1"  (1.549 m)  Wt 169 lb (76.658 kg)  BMI 31.95 kg/m2  SpO2 96% Physical Exam  Constitutional: She is oriented to person, place, and time. She appears well-developed and well-nourished.  HENT:  Head: Normocephalic and atraumatic.  Left Ear: External ear normal.  Mouth/Throat: Oropharynx is clear and moist.  Pt has a TM perforation (chronic per pt)  Eyes: Pupils are equal, round, and reactive to light.  Neck: Normal range of motion. Neck supple.  Cardiovascular: Normal rate, regular rhythm and normal heart sounds.   Pulmonary/Chest: Effort normal and breath sounds normal. No respiratory distress. She has no wheezes. She has no rales. She exhibits no tenderness.  Abdominal: Soft. Bowel sounds are normal. There is no tenderness. There is no rebound and no guarding.  Musculoskeletal: Normal range of motion. She exhibits no edema.  Lymphadenopathy:    She has no cervical adenopathy.  Neurological: She is alert and oriented to person, place, and time.  Skin: Skin is warm and dry. No rash noted.  Psychiatric: She has a normal mood and affect.    ED  Course  Procedures (including critical care time) Labs Review Labs Reviewed - No data to display  Imaging Review Dg Chest 2 View  11/18/2013   CLINICAL DATA:  Cough and sore throat  EXAM: CHEST  2 VIEW  COMPARISON:  11/30/2012  FINDINGS: The heart size is at upper limits of normal. Both lungs are clear. The visualized skeletal structures are unremarkable. Epigastric clips are noted. IVC filter partly visualized. Stable superimposition of vascular structures overlying the left anterior sixth rib.  IMPRESSION: Borderline cardiomegaly without focal acute finding.   Electronically Signed   By: Conchita Paris M.D.   On: 11/18/2013 11:00     EKG Interpretation None      MDM   Final diagnoses:  Bronchitis    Is no evidence of pneumonia. I will go ahead and start her on doxycycline and given her age and worsening symptoms. She's also given a prescription for [employer]. She  was encouraged to follow up with her primary care physician within 2-3 days if her symptoms are not improving or if she has any worsening symptoms.    [name], MD 11/18/13 1115

## 2014-03-27 ENCOUNTER — Encounter (HOSPITAL_BASED_OUTPATIENT_CLINIC_OR_DEPARTMENT_OTHER): Payer: Self-pay | Admitting: Emergency Medicine

## 2014-03-27 ENCOUNTER — Emergency Department (HOSPITAL_BASED_OUTPATIENT_CLINIC_OR_DEPARTMENT_OTHER)
Admission: EM | Admit: 2014-03-27 | Discharge: 2014-03-28 | Disposition: A | Discharge status: Other Acute Inpatient Hospital | Payer: Medicare Other | Attending: Emergency Medicine | Admitting: Emergency Medicine

## 2014-03-27 ENCOUNTER — Emergency Department (HOSPITAL_BASED_OUTPATIENT_CLINIC_OR_DEPARTMENT_OTHER): Payer: Medicare Other

## 2014-03-27 DIAGNOSIS — M549 Dorsalgia, unspecified: Secondary | ICD-10-CM | POA: Diagnosis present

## 2014-03-27 DIAGNOSIS — R109 Unspecified abdominal pain: Secondary | ICD-10-CM | POA: Diagnosis not present

## 2014-03-27 DIAGNOSIS — M5489 Other dorsalgia: Secondary | ICD-10-CM

## 2014-03-27 DIAGNOSIS — Z792 Long term (current) use of antibiotics: Secondary | ICD-10-CM | POA: Insufficient documentation

## 2014-03-27 DIAGNOSIS — R110 Nausea: Secondary | ICD-10-CM | POA: Diagnosis not present

## 2014-03-27 DIAGNOSIS — D649 Anemia, unspecified: Secondary | ICD-10-CM | POA: Diagnosis not present

## 2014-03-27 DIAGNOSIS — E119 Type 2 diabetes mellitus without complications: Secondary | ICD-10-CM | POA: Diagnosis not present

## 2014-03-27 DIAGNOSIS — Z79899 Other long term (current) drug therapy: Secondary | ICD-10-CM | POA: Insufficient documentation

## 2014-03-27 DIAGNOSIS — Z7952 Long term (current) use of systemic steroids: Secondary | ICD-10-CM | POA: Diagnosis not present

## 2014-03-27 DIAGNOSIS — I10 Essential (primary) hypertension: Secondary | ICD-10-CM | POA: Diagnosis not present

## 2014-03-27 DIAGNOSIS — Z7901 Long term (current) use of anticoagulants: Secondary | ICD-10-CM | POA: Insufficient documentation

## 2014-03-27 DIAGNOSIS — R000 Tachycardia, unspecified: Secondary | ICD-10-CM | POA: Insufficient documentation

## 2014-03-27 DIAGNOSIS — K219 Gastro-esophageal reflux disease without esophagitis: Secondary | ICD-10-CM | POA: Diagnosis not present

## 2014-03-27 DIAGNOSIS — R1084 Generalized abdominal pain: Secondary | ICD-10-CM

## 2014-03-27 LAB — URINALYSIS, ROUTINE W REFLEX MICROSCOPIC
BILIRUBIN URINE: NEGATIVE
Glucose, UA: NEGATIVE mg/dL
Hgb urine dipstick: NEGATIVE
KETONES UR: NEGATIVE mg/dL
Leukocytes, UA: NEGATIVE
NITRITE: NEGATIVE
Protein, ur: NEGATIVE mg/dL
SPECIFIC GRAVITY, URINE: 1.02 (ref 1.005–1.030)
Urobilinogen, UA: 0.2 mg/dL (ref 0.0–1.0)
pH: 5.5 (ref 5.0–8.0)

## 2014-03-27 LAB — COMPREHENSIVE METABOLIC PANEL
ALBUMIN: 2.8 g/dL — AB (ref 3.5–5.2)
ALT: 20 U/L (ref 0–35)
AST: 23 U/L (ref 0–37)
Alkaline Phosphatase: 37 U/L — ABNORMAL LOW (ref 39–117)
Anion gap: 11 (ref 5–15)
BUN: 47 mg/dL — AB (ref 6–23)
CALCIUM: 9.1 mg/dL (ref 8.4–10.5)
CHLORIDE: 100 meq/L (ref 96–112)
CO2: 24 mEq/L (ref 19–32)
CREATININE: 1 mg/dL (ref 0.50–1.10)
GFR calc Af Amer: 62 mL/min — ABNORMAL LOW (ref >90)
GFR, EST NON AFRICAN AMERICAN: 54 mL/min — AB (ref >90)
Glucose, Bld: 101 mg/dL — ABNORMAL HIGH (ref 70–99)
Potassium: 5 mEq/L (ref 3.7–5.3)
Sodium: 135 mEq/L — ABNORMAL LOW (ref 137–147)
Total Bilirubin: <0.2 mg/dL — ABNORMAL LOW (ref 0.3–1.2)
Total Protein: 5.2 g/dL — ABNORMAL LOW (ref 6.0–8.3)

## 2014-03-27 LAB — CBC WITH DIFFERENTIAL/PLATELET
BASOS ABS: 0 10*3/uL (ref 0.0–0.1)
BASOS PCT: 1 % (ref 0–1)
EOS PCT: 2 % (ref 0–5)
Eosinophils Absolute: 0.1 10*3/uL (ref 0.0–0.7)
HCT: 21.6 % — ABNORMAL LOW (ref 36.0–46.0)
HEMOGLOBIN: 6.9 g/dL — AB (ref 12.0–15.0)
Lymphocytes Relative: 33 % (ref 12–46)
Lymphs Abs: 2 10*3/uL (ref 0.7–4.0)
MCH: 29.2 pg (ref 26.0–34.0)
MCHC: 31.9 g/dL (ref 30.0–36.0)
MCV: 91.5 fL (ref 78.0–100.0)
Monocytes Absolute: 0.6 10*3/uL (ref 0.1–1.0)
Monocytes Relative: 10 % (ref 3–12)
NEUTROS ABS: 3.4 10*3/uL (ref 1.7–7.7)
Neutrophils Relative %: 54 % (ref 43–77)
Platelets: 261 10*3/uL (ref 150–400)
RBC: 2.36 MIL/uL — ABNORMAL LOW (ref 3.87–5.11)
RDW: 15.3 % (ref 11.5–15.5)
WBC: 6.2 10*3/uL (ref 4.0–10.5)

## 2014-03-27 LAB — PROTIME-INR
INR: 2.51 — ABNORMAL HIGH (ref 0.00–1.49)
Prothrombin Time: 27.1 seconds — ABNORMAL HIGH (ref 11.6–15.2)

## 2014-03-27 LAB — I-STAT CG4 LACTIC ACID, ED: Lactic Acid, Venous: 0.61 mmol/L (ref 0.5–2.2)

## 2014-03-27 LAB — LIPASE, BLOOD: LIPASE: 24 U/L (ref 11–59)

## 2014-03-27 LAB — RETICULOCYTES
RBC.: 2.4 MIL/uL — AB (ref 3.87–5.11)
RETIC COUNT ABSOLUTE: 40.8 10*3/uL (ref 19.0–186.0)
Retic Ct Pct: 1.7 % (ref 0.4–3.1)

## 2014-03-27 LAB — OCCULT BLOOD X 1 CARD TO LAB, STOOL: FECAL OCCULT BLD: POSITIVE — AB

## 2014-03-27 MED ORDER — IOHEXOL 300 MG/ML  SOLN
100.0000 mL | Freq: Once | INTRAMUSCULAR | Status: AC | PRN
  Administered 2014-03-27: 100 mL via INTRAVENOUS

## 2014-03-27 MED ORDER — SODIUM CHLORIDE 0.9 % IV SOLN
1000.0000 mL | INTRAVENOUS | Status: DC
  Administered 2014-03-27: 1000 mL via INTRAVENOUS

## 2014-03-27 MED ORDER — IOHEXOL 300 MG/ML  SOLN
25.0000 mL | Freq: Once | INTRAMUSCULAR | Status: AC | PRN
  Administered 2014-03-27: 25 mL via ORAL

## 2014-03-27 MED ORDER — SODIUM CHLORIDE 0.9 % IV SOLN
1000.0000 mL | Freq: Once | INTRAVENOUS | Status: AC
  Administered 2014-03-27: 1000 mL via INTRAVENOUS

## 2014-03-27 NOTE — ED Provider Notes (Addendum)
CSN: [id number]     Arrival date & time 03/27/14  1946 History  This chart was scribed for Hoy Morn, MD by Edison Simon, ED Scribe. This patient was seen in room MH05/MH05 and the patient's care was started at 8:33 PM.    Chief Complaint  Patient presents with  . Back Pain  . Abdominal Pain   The history is provided by the patient. No language interpreter was used.    HPI Comments: Amy Hickman is a 78 y.o. female who presents to the Emergency Department complaining of back pain and lightheadedness with onset this morning. She states she woke at 0500 this morning and felt fine for several hours while doing normal activities, then had onset of back pain with lightheadedness and dizziness while vacuuming. She also complains of nausea, dry heaves, upper abdominal pain, chills, dry mouth, and dysuria. Her symptoms are intermittent and improve with rest, but she states her lightheadedness and abdominal pain have been present constantly. She states that while lying in bed at the ED now, she only feels a little lightheaded. 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 She denies having similar symptoms previously. She denies vomiting, urinary frequency, fever, headache, or one-sided weakness.  Denies melena and hematochezia.  Reports maybe her stools have been slightly darker than usual.  Family reports patient also has a history of Crohn's disease and celiac disease.  She underwent endoscopy and colonoscopy last week with reported polyp removal on the colonoscopy.  Past Medical History  Diagnosis Date  . Diabetes mellitus without complication   . Hypertension   . GERD (gastroesophageal reflux disease)    Past Surgical History  Procedure Laterality Date  . Replacement total knee     No family history on file. History  Substance Use Topics  . Smoking status: Never Smoker   . Smokeless tobacco: Not on file  . Alcohol Use: No   OB  History    No data available     Review of Systems A complete 10 system review of systems was obtained and all systems are negative except as noted in the HPI and PMH.    Allergies  Ativan; Ciprofloxacin hcl; Codeine; Darvon; Gluten meal; and Phenergan  Home Medications   Prior to Admission medications   Medication Sig Start Date End Date Taking? Authorizing Provider  acetaminophen (TYLENOL) 500 MG tablet Take 1 tablet (500 mg total) by mouth every 6 (six) hours as needed for pain. 06/24/12   [name], MD  amLODipine-benazepril (LOTREL) 5-10 MG per capsule Take 1 capsule by mouth daily.    Historical Provider, MD  benzonatate (TESSALON PERLES) 100 MG capsule Take 1 capsule (100 mg total) by mouth 3 (three) times daily as needed for cough. 11/18/13   [name], MD  chlordiazePOXIDE (LIBRIUM) 25 MG capsule Take 25 mg by mouth 3 (three) times daily as needed.    Historical Provider, MD  doxycycline (VIBRAMYCIN) 100 MG capsule Take 1 capsule (100 mg total) by mouth 2 (two) times daily. One po bid x 7 days 11/18/13   [name], MD  esomeprazole (NEXIUM) 40 MG capsule Take 40 mg by mouth daily before breakfast.    Historical Provider, MD  levothyroxine (SYNTHROID, LEVOTHROID) 100 MCG tablet Take 100 mcg by mouth daily.    Historical Provider, MD  losartan (COZAAR) 100 MG tablet Take 100 mg by mouth daily.    Historical Provider, MD  metoprolol (LOPRESSOR) 50  MG tablet Take 50 mg by mouth 2 (two) times daily.    Historical Provider, MD  mometasone (NASONEX) 50 MCG/ACT nasal spray Place 2 sprays into the nose daily.    Historical Provider, MD  oxycodone (OXY-IR) 5 MG capsule Take 1 capsule (5 mg total) by mouth every 6 (six) hours as needed. 05/01/12   Saddie Benders. Ghim, MD  oxyCODONE-acetaminophen (PERCOCET/ROXICET) 5-325 MG per tablet Take 1 tablet by mouth every 4 (four) hours as needed for pain. 10/20/12   Shaune Pollack, MD  pentosan polysulfate (ELMIRON) 100 MG capsule Take 100 mg by  mouth 3 (three) times daily before meals.    Historical Provider, MD  pravastatin (PRAVACHOL) 20 MG tablet Take 20 mg by mouth daily.    Historical Provider, MD  risedronate (ACTONEL) 150 MG tablet Take 150 mg by mouth every 30 (thirty) days. with water on empty stomach, nothing by mouth or lie down for next 30 minutes.    Historical Provider, MD  Travoprost, BAK Free, (TRAVATAN) 0.004 % SOLN ophthalmic solution 1 drop at bedtime.    Historical Provider, MD  warfarin (COUMADIN) 7.5 MG tablet Take 7.5 mg by mouth daily.    Historical Provider, MD   BP 92/50 mmHg  Pulse 109  Temp(Src) 97.5 F (36.4 C) (Oral)  Resp 18  SpO2 100% Physical Exam  Constitutional: She is oriented to person, place, and time. She appears well-developed and well-nourished. No distress.  HENT:  Head: Normocephalic and atraumatic.  Eyes: EOM are normal.  Neck: Normal range of motion.  Cardiovascular: Regular rhythm and normal heart sounds.   tachycardic  Pulmonary/Chest: Effort normal and breath sounds normal.  Abdominal: Soft. She exhibits no distension. There is no tenderness.  Genitourinary:  No gross blood No stool encountered Nontender rectal exam  Musculoskeletal: Normal range of motion.  Mild paralumbar tenderness bilaterally   Neurological: She is alert and oriented to person, place, and time.  Skin: Skin is warm and dry.  Psychiatric: She has a normal mood and affect. Judgment normal.  Nursing note and vitals reviewed.   ED Course  Procedures (including critical care time)  DIAGNOSTIC STUDIES: Oxygen Saturation is 100% on room air, normal by my interpretation.    COORDINATION OF CARE: 8:40 PM Discussed treatment plan with patient at beside, the patient agrees with the plan and has no further questions at this time.   Labs Review Labs Reviewed  CBC WITH DIFFERENTIAL - Abnormal; Notable for the following:    RBC 2.36 (*)    Hemoglobin 6.9 (*)    HCT 21.6 (*)    All other components within  normal limits  COMPREHENSIVE METABOLIC PANEL - Abnormal; Notable for the following:    Sodium 135 (*)    Glucose, Bld 101 (*)    BUN 47 (*)    Total Protein 5.2 (*)    Albumin 2.8 (*)    Alkaline Phosphatase 37 (*)    Total Bilirubin <0.2 (*)    GFR calc non Af Amer 54 (*)    GFR calc Af Amer 62 (*)    All other components within normal limits  URINALYSIS, ROUTINE W REFLEX MICROSCOPIC - Abnormal; Notable for the following:    Color, Urine GREEN (*)    All other components within normal limits  PROTIME-INR - Abnormal; Notable for the following:    Prothrombin Time 27.1 (*)    INR 2.51 (*)    All other components within normal limits  RETICULOCYTES - Abnormal; Notable  for the following:    RBC. 2.40 (*)    All other components within normal limits  OCCULT BLOOD X 1 CARD TO LAB, STOOL - Abnormal; Notable for the following:    Fecal Occult Bld POSITIVE (*)    All other components within normal limits  LIPASE, BLOOD  VITAMIN B12  FOLATE  IRON AND TIBC  FERRITIN  I-STAT CG4 LACTIC ACID, ED    Imaging Review Ct Abdomen Pelvis W Contrast  03/27/2014   CLINICAL DATA:  Dizziness, abdominal pain, nausea, bilateral flank pain.  EXAM: CT ABDOMEN AND PELVIS WITH CONTRAST  TECHNIQUE: Multidetector CT imaging of the abdomen and pelvis was performed using the standard protocol following bolus administration of intravenous contrast.  CONTRAST:  42m OMNIPAQUE IOHEXOL 300 MG/ML SOLN, 1075mOMNIPAQUE IOHEXOL 300 MG/ML SOLN  COMPARISON:  None.  FINDINGS: Lung bases are clear. Surgical clips at the EG junction. Inferior vena caval filter. Calcification of the aorta without aneurysm. The liver, spleen, pancreas, adrenal glands, kidneys, inferior vena cava, and retroperitoneal lymph nodes are unremarkable. Gallbladder is not identified and may be contracted or surgically absent. Stomach is decompressed. Small bowel are not abnormally distended. Stool-filled colon without significant distention or wall  thickening. No free air or free fluid in the abdomen.  Pelvis: Uterus appears to be surgically absent. No free or loculated pelvic fluid collections. No pelvic mass or lymphadenopathy. Appendix appears surgically absent. Bladder wall is not thickened. Degenerative changes in the spine. No destructive bone lesions.  IMPRESSION: No focal abnormality identified to account for patient's symptoms.   Electronically Signed   By: WiLucienne Capers.D.   On: 03/27/2014 22:10  I personally reviewed the imaging tests through PACS system I reviewed available ER/hospitalization records through the EMR    EKG Interpretation   Date/Time:  Tuesday March 27 2014 20:26:44 EST Ventricular Rate:  100 PR Interval:  136 QRS Duration: 68 QT Interval:  334 QTC Calculation: 430 R Axis:   29 Text Interpretation:  Normal sinus rhythm Normal ECG No old tracing to  compare Confirmed by Bernard Donahoo  MD, Sadeel Fiddler ([id number] 03/28/2014 12:27:07 AM      MDM   Final diagnoses:  Back pain  Abdominal pain  Nausea    Suspect upper GI bleed given mild elevation in BUN as well as some upper abdominal discomfort.  Trace heme positive stool.  No bright red blood on rectal exam.  No significant stool encountered.  CT scan without pathology.  Patient is on Coumadin and her INR is 2.4.  [facility] hemoglobins and likely gastroenterology consultation.  I do not think she needs emergent consultation at this time.  Will discuss with hospitalist in regards to treatment with vitamin K.  She will likely benefit from blood transfusion in the hospital given hemoglobin of 6.9.  I have no type specific blood here at this free standing emergency department.  I do not think she needs O- blood at this time. Seen by Dr HuAlonza Bogusast week.  Family is unsure why she had an endoscopy as well as a colonoscopy last week.  I suspect somewhat was working up developing anemia that time.  Symptomatic now  I  personally performed the services described in this documentation, which was scribed in my presence. The recorded information has been reviewed and is accurate.    11:33 PM Spoke with Dr PoFlorene Glenf [facility] who accepts pt in transfer to [facility]  He agrees with holding vitamin K at this time and instead allowing the INR to drift down. Last BP 100/48. Will monitor closely  Hoy Morn, MD 03/27/14 2316  Hoy Morn, MD 03/27/14 [place], MD 03/27/14 [place], MD 03/28/14 [phone]

## 2014-03-27 NOTE — ED Notes (Signed)
Pt presents to ED with complaints of back pain and abdominal pain

## 2014-03-27 NOTE — ED Notes (Signed)
Pt transported to CT ?

## 2014-03-28 DIAGNOSIS — K219 Gastro-esophageal reflux disease without esophagitis: Secondary | ICD-10-CM | POA: Diagnosis not present

## 2014-03-28 DIAGNOSIS — D649 Anemia, unspecified: Secondary | ICD-10-CM | POA: Diagnosis not present

## 2014-03-28 DIAGNOSIS — R109 Unspecified abdominal pain: Secondary | ICD-10-CM | POA: Diagnosis not present

## 2014-03-28 DIAGNOSIS — I10 Essential (primary) hypertension: Secondary | ICD-10-CM | POA: Diagnosis not present

## 2014-03-28 DIAGNOSIS — Z7952 Long term (current) use of systemic steroids: Secondary | ICD-10-CM | POA: Diagnosis not present

## 2014-03-28 DIAGNOSIS — M549 Dorsalgia, unspecified: Secondary | ICD-10-CM | POA: Diagnosis present

## 2014-03-28 DIAGNOSIS — R110 Nausea: Secondary | ICD-10-CM | POA: Diagnosis not present

## 2014-03-28 DIAGNOSIS — E119 Type 2 diabetes mellitus without complications: Secondary | ICD-10-CM | POA: Diagnosis not present

## 2014-03-28 DIAGNOSIS — R000 Tachycardia, unspecified: Secondary | ICD-10-CM | POA: Diagnosis not present

## 2014-03-28 DIAGNOSIS — Z79899 Other long term (current) drug therapy: Secondary | ICD-10-CM | POA: Diagnosis not present

## 2014-03-28 DIAGNOSIS — Z7901 Long term (current) use of anticoagulants: Secondary | ICD-10-CM | POA: Diagnosis not present

## 2014-03-28 DIAGNOSIS — Z792 Long term (current) use of antibiotics: Secondary | ICD-10-CM | POA: Diagnosis not present

## 2014-03-28 LAB — IRON AND TIBC
Iron: 87 ug/dL (ref 42–135)
SATURATION RATIOS: 33 % (ref 20–55)
TIBC: 265 ug/dL (ref 250–470)
UIBC: 178 ug/dL (ref 125–400)

## 2014-03-28 LAB — FOLATE: Folate: >20 ng/mL

## 2014-03-28 LAB — VITAMIN B12: Vitamin B-12: 941 pg/mL — ABNORMAL HIGH (ref 211–911)

## 2014-03-28 LAB — FERRITIN: Ferritin: 27 ng/mL (ref 10–291)

## 2014-03-28 LAB — CBG MONITORING, ED: Glucose-Capillary: 107 mg/dL — ABNORMAL HIGH (ref 70–99)

## 2014-03-28 NOTE — ED Notes (Signed)
Pt actually discharged with NSS at 125cc/hr

## 2014-03-28 NOTE — ED Notes (Signed)
Called [facility] spoke with [facility] rep Suanne Marker, placing call for transport

## 2014-06-15 DIAGNOSIS — Z8719 Personal history of other diseases of the digestive system: Secondary | ICD-10-CM | POA: Insufficient documentation

## 2014-10-22 ENCOUNTER — Emergency Department (HOSPITAL_BASED_OUTPATIENT_CLINIC_OR_DEPARTMENT_OTHER): Payer: Medicare Other

## 2014-10-22 ENCOUNTER — Emergency Department (HOSPITAL_BASED_OUTPATIENT_CLINIC_OR_DEPARTMENT_OTHER)
Admission: EM | Admit: 2014-10-22 | Discharge: 2014-10-22 | Disposition: A | Discharge status: Home / Self Care | Payer: Medicare Other | Attending: Emergency Medicine | Admitting: Emergency Medicine

## 2014-10-22 ENCOUNTER — Encounter (HOSPITAL_BASED_OUTPATIENT_CLINIC_OR_DEPARTMENT_OTHER): Payer: Self-pay | Admitting: *Deleted

## 2014-10-22 DIAGNOSIS — K219 Gastro-esophageal reflux disease without esophagitis: Secondary | ICD-10-CM | POA: Diagnosis not present

## 2014-10-22 DIAGNOSIS — Z79899 Other long term (current) drug therapy: Secondary | ICD-10-CM | POA: Insufficient documentation

## 2014-10-22 DIAGNOSIS — R609 Edema, unspecified: Secondary | ICD-10-CM | POA: Diagnosis not present

## 2014-10-22 DIAGNOSIS — M79662 Pain in left lower leg: Secondary | ICD-10-CM | POA: Diagnosis present

## 2014-10-22 DIAGNOSIS — Z7901 Long term (current) use of anticoagulants: Secondary | ICD-10-CM | POA: Insufficient documentation

## 2014-10-22 DIAGNOSIS — Z86718 Personal history of other venous thrombosis and embolism: Secondary | ICD-10-CM | POA: Diagnosis not present

## 2014-10-22 DIAGNOSIS — Z7951 Long term (current) use of inhaled steroids: Secondary | ICD-10-CM | POA: Diagnosis not present

## 2014-10-22 DIAGNOSIS — I10 Essential (primary) hypertension: Secondary | ICD-10-CM | POA: Diagnosis not present

## 2014-10-22 DIAGNOSIS — E119 Type 2 diabetes mellitus without complications: Secondary | ICD-10-CM | POA: Insufficient documentation

## 2014-10-22 DIAGNOSIS — M7989 Other specified soft tissue disorders: Secondary | ICD-10-CM

## 2014-10-22 LAB — CBC WITH DIFFERENTIAL/PLATELET
Basophils Absolute: 0 10*3/uL (ref 0.0–0.1)
Basophils Relative: 1 % (ref 0–1)
Eosinophils Absolute: 0.2 10*3/uL (ref 0.0–0.7)
Eosinophils Relative: 4 % (ref 0–5)
HEMATOCRIT: 35.4 % — AB (ref 36.0–46.0)
HEMOGLOBIN: 11.6 g/dL — AB (ref 12.0–15.0)
LYMPHS PCT: 37 % (ref 12–46)
Lymphs Abs: 1.7 10*3/uL (ref 0.7–4.0)
MCH: 27.8 pg (ref 26.0–34.0)
MCHC: 32.8 g/dL (ref 30.0–36.0)
MCV: 84.7 fL (ref 78.0–100.0)
MONOS PCT: 9 % (ref 3–12)
Monocytes Absolute: 0.4 10*3/uL (ref 0.1–1.0)
NEUTROS ABS: 2.3 10*3/uL (ref 1.7–7.7)
Neutrophils Relative %: 49 % (ref 43–77)
PLATELETS: 203 10*3/uL (ref 150–400)
RBC: 4.18 MIL/uL (ref 3.87–5.11)
RDW: 19.1 % — AB (ref 11.5–15.5)
WBC: 4.7 10*3/uL (ref 4.0–10.5)

## 2014-10-22 LAB — BASIC METABOLIC PANEL
ANION GAP: 9 (ref 5–15)
BUN: 17 mg/dL (ref 6–20)
CALCIUM: 10.1 mg/dL (ref 8.9–10.3)
CO2: 29 mmol/L (ref 22–32)
CREATININE: 0.76 mg/dL (ref 0.44–1.00)
Chloride: 94 mmol/L — ABNORMAL LOW (ref 101–111)
GFR calc Af Amer: >60 mL/min (ref >60)
Glucose, Bld: 97 mg/dL (ref 65–99)
POTASSIUM: 3.8 mmol/L (ref 3.5–5.1)
Sodium: 132 mmol/L — ABNORMAL LOW (ref 135–145)

## 2014-10-22 LAB — CK: Total CK: 101 U/L (ref 38–234)

## 2014-10-22 LAB — D-DIMER, QUANTITATIVE (NOT AT ARMC): D DIMER QUANT: 0.54 ug{FEU}/mL — AB (ref 0.00–0.48)

## 2014-10-22 LAB — TROPONIN I

## 2014-10-22 NOTE — Discharge Instructions (Signed)
Peripheral Edema There is no evidence of blood clot. Follow up with the cardiologist for a stress test. Return to the ED if you develop new or worsening symptoms. You have swelling in your legs (peripheral edema). This swelling is due to excess accumulation of salt and water in your body. Edema may be a sign of heart, kidney or liver disease, or a side effect of a medication. It may also be due to problems in the leg veins. Elevating your legs and using special support stockings may be very helpful, if the cause of the swelling is due to poor venous circulation. Avoid long periods of standing, whatever the cause. Treatment of edema depends on identifying the cause. Chips, pretzels, pickles and other salty foods should be avoided. Restricting salt in your diet is almost always needed. Water pills (diuretics) are often used to remove the excess salt and water from your body via urine. These medicines prevent the kidney from reabsorbing sodium. This increases urine flow. Diuretic treatment may also result in lowering of potassium levels in your body. Potassium supplements may be needed if you have to use diuretics daily. Daily weights can help you keep track of your progress in clearing your edema. You should call your caregiver for follow up care as recommended. SEEK IMMEDIATE MEDICAL CARE IF:   You have increased swelling, pain, redness, or heat in your legs.  You develop shortness of breath, especially when lying down.  You develop chest or abdominal pain, weakness, or fainting.  You have a fever. Document Released: 05/14/2004 Document Revised: 06/29/2011 Document Reviewed: 04/24/2009 [facility] Patient Information 2015 [place], [place]. This information is not intended to replace advice given to you by your health care provider. Make sure you discuss any questions you have with your health care provider.

## 2014-10-22 NOTE — ED Provider Notes (Signed)
CSN: [id number]     Arrival date & time 10/22/14  1514 History   This chart was scribed for Ezequiel Essex, MD by Randa Evens, ED Scribe. This patient was seen in room MH01/MH01 and the patient's care was started at 3:22 PM.      Chief Complaint  Patient presents with  . Leg Pain   The history is provided by the patient. No language interpreter was used.   HPI Comments: KATLEEN CARRAWAY is a 79 y.o. femalewith PMHx of HTN and bilateral knee replacements who presents to the Emergency Department complaining of left lower leg pain onset this morning. She states she has associated swelling in the calf. Pt does states that the pain is worse when ambulating. Pt denies fall or injury. Pt states she tried putting on a compression sock and states that may have made the pain worse. Pt also report some intermittent chest pressure for several weeks. She does report her last normal stress test was 5 years prior. Pt denies SOB or any CP now. 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 Pt states she was taken off of coumadin due to internal bleeding.   Past Medical History  Diagnosis Date  . Diabetes mellitus without complication   . Hypertension   . GERD (gastroesophageal reflux disease)    Past Surgical History  Procedure Laterality Date  . Replacement total knee     No family history on file. History  Substance Use Topics  . Smoking status: Never Smoker   . Smokeless tobacco: Not on file  . Alcohol Use: No   OB History    No data available     Review of Systems  Respiratory: Negative for shortness of breath.   Cardiovascular: Negative for chest pain.  Musculoskeletal: Positive for myalgias and joint swelling.  All other systems reviewed and are negative.     Allergies  Ativan; Ciprofloxacin hcl; Codeine; Darvon; Gluten meal; and Phenergan  Home Medications   Prior to Admission medications   Medication Sig Start Date End Date Taking? Authorizing Provider  acetaminophen (TYLENOL) 500 MG  tablet Take 1 tablet (500 mg total) by mouth every 6 (six) hours as needed for pain. 06/24/12   [name], MD  amLODipine-benazepril (LOTREL) 5-10 MG per capsule Take 1 capsule by mouth daily.    Historical Provider, MD  benzonatate (TESSALON PERLES) 100 MG capsule Take 1 capsule (100 mg total) by mouth 3 (three) times daily as needed for cough. 11/18/13   [name], MD  chlordiazePOXIDE (LIBRIUM) 25 MG capsule Take 25 mg by mouth 3 (three) times daily as needed.    Historical Provider, MD  doxycycline (VIBRAMYCIN) 100 MG capsule Take 1 capsule (100 mg total) by mouth 2 (two) times daily. One po bid x 7 days 11/18/13   [name], MD  esomeprazole (NEXIUM) 40 MG capsule Take 40 mg by mouth daily before breakfast.    Historical Provider, MD  levothyroxine (SYNTHROID, LEVOTHROID) 100 MCG tablet Take 100 mcg by mouth daily.    Historical Provider, MD  losartan (COZAAR) 100 MG tablet Take 100 mg by mouth daily.    Historical Provider, MD  metoprolol (LOPRESSOR) 50 MG tablet Take 50 mg by mouth 2 (two) times daily.    Historical Provider, MD  mometasone (NASONEX) 50 MCG/ACT nasal spray Place 2 sprays into the nose daily.    Historical Provider, MD  oxycodone (OXY-IR) 5 MG capsule Take 1 capsule (5 mg total) by mouth every 6 (six)  hours as needed. 05/01/12   [name], MD  oxyCODONE-acetaminophen (PERCOCET/ROXICET) 5-325 MG per tablet Take 1 tablet by mouth every 4 (four) hours as needed for pain. 10/20/12   Pattricia Boss, MD  pentosan polysulfate (ELMIRON) 100 MG capsule Take 100 mg by mouth 3 (three) times daily before meals.    Historical Provider, MD  pravastatin (PRAVACHOL) 20 MG tablet Take 20 mg by mouth daily.    Historical Provider, MD  risedronate (ACTONEL) 150 MG tablet Take 150 mg by mouth every 30 (thirty) days. with water on empty stomach, nothing by mouth or lie down for next 30 minutes.    Historical Provider, MD  Travoprost, BAK Free, (TRAVATAN) 0.004 % SOLN ophthalmic solution 1 drop  at bedtime.    Historical Provider, MD  warfarin (COUMADIN) 7.5 MG tablet Take 7.5 mg by mouth daily.    Historical Provider, MD   BP 150/80 mmHg  Pulse 78  Temp(Src) 97.8 F (36.6 C) (Oral)  Resp 16  Ht 5' 1"  (1.549 m)  Wt 153 lb (69.4 kg)  BMI 28.92 kg/m2  SpO2 98%   Physical Exam  Constitutional: She is oriented to person, place, and time. She appears well-developed and well-nourished. No distress.  HENT:  Head: Normocephalic and atraumatic.  Mouth/Throat: Oropharynx is clear and moist. No oropharyngeal exudate.  Eyes: Conjunctivae and EOM are normal. Pupils are equal, round, and reactive to light.  Neck: Normal range of motion. Neck supple.  No meningismus.  Cardiovascular: Normal rate, regular rhythm, normal heart sounds and intact distal pulses.   No murmur heard. Pulmonary/Chest: Effort normal and breath sounds normal. No respiratory distress. She exhibits tenderness.  Lower central chest tenderness.   Abdominal: Soft. There is no tenderness. There is no rebound and no guarding.  Musculoskeletal: Normal range of motion. She exhibits edema and tenderness.  LLE mild edema and calf tenderness, multiple varicose veins, intact DP and PT pulses.   Neurological: She is alert and oriented to person, place, and time. No cranial nerve deficit. She exhibits normal muscle tone. Coordination normal.  No ataxia on finger to nose bilaterally. No pronator drift. 5/5 strength throughout. CN 2-12 intact. Negative Romberg. Equal grip strength. Sensation intact. Gait is normal.   Skin: Skin is warm.  Psychiatric: She has a normal mood and affect. Her behavior is normal.  Nursing note and vitals reviewed.   ED Course  Procedures (including critical care time) DIAGNOSTIC STUDIES: Oxygen Saturation is 97% on RA, normal by my interpretation.    COORDINATION OF CARE: 3:33 PM-Discussed treatment plan with pt at bedside and pt agreed to plan.     Labs Review Labs Reviewed  CBC WITH  DIFFERENTIAL/PLATELET - Abnormal; Notable for the following:    Hemoglobin 11.6 (*)    HCT 35.4 (*)    RDW 19.1 (*)    All other components within normal limits  BASIC METABOLIC PANEL - Abnormal; Notable for the following:    Sodium 132 (*)    Chloride 94 (*)    All other components within normal limits  D-DIMER, QUANTITATIVE (NOT AT [facility]) - Abnormal; Notable for the following:    D-Dimer, Quant 0.54 (*)    All other components within normal limits  TROPONIN I  CK    Imaging Review Dg Chest 2 View  10/22/2014   CLINICAL DATA:  Two week history of pressure in the upper chest. Acute onset of left lower extremity pain earlier today. Current history of diabetes and hypertension.  EXAM: CHEST  2 VIEW  COMPARISON:  06/30/2014 and earlier.  FINDINGS: Cardiac silhouette normal in size, unchanged. Thoracic aorta mildly atherosclerotic consistent with age, unchanged. Hilar and mediastinal contours otherwise unremarkable. Mildly prominent bronchovascular markings diffusely and mild central peribronchial thickening associated with mild hyperinflation, unchanged. Lungs otherwise clear. No localized airspace consolidation. No pleural effusions. No pneumothorax. Normal pulmonary vascularity. Surgical clips at the esophagogastric junction. IVC filter noted in the intrahepatic IVC.  IMPRESSION: Stable mild changes of chronic bronchitis and/or asthma. No acute cardiopulmonary disease.   Electronically Signed   By: Evangeline Dakin M.D.   On: 10/22/2014 16:14   US Venous Img Lower Unilateral Left  10/22/2014   CLINICAL DATA:  Left may calf swelling and pain starting today.  EXAM: LEFT LOWER EXTREMITY VENOUS DOPPLER ULTRASOUND  TECHNIQUE: Gray-scale sonography with graded compression, as well as color Doppler and duplex ultrasound were performed to evaluate the lower extremity deep venous systems from the level of the common femoral vein and including the common femoral, femoral, profunda femoral, popliteal and calf  veins including the posterior tibial, peroneal and gastrocnemius veins when visible. The superficial great saphenous vein was also interrogated. Spectral Doppler was utilized to evaluate flow at rest and with distal augmentation maneuvers in the common femoral, femoral and popliteal veins.  COMPARISON:  None.  FINDINGS: Contralateral Common Femoral Vein: Respiratory phasicity is normal and symmetric with the symptomatic side. No evidence of thrombus. Normal compressibility.  Common Femoral Vein: No evidence of thrombus. Normal compressibility, respiratory phasicity and response to augmentation.  Saphenofemoral Junction: No evidence of thrombus. Normal compressibility and flow on color Doppler imaging.  Profunda Femoral Vein: No evidence of thrombus. Normal compressibility and flow on color Doppler imaging.  Femoral Vein: No evidence of thrombus. Normal compressibility, respiratory phasicity and response to augmentation.  Popliteal Vein: No evidence of thrombus. Normal compressibility, respiratory phasicity and response to augmentation.  Calf Veins: No evidence of thrombus. Normal compressibility and flow on color Doppler imaging.  Superficial Great Saphenous Vein: No evidence of thrombus. Normal compressibility and flow on color Doppler imaging.  Venous Reflux:  None.  Other Findings: In the left superior medial calf, area pain, there is an area of edema with debris using calcifications beneath the skin.  IMPRESSION: No evidence of deep venous thrombosis.  In the left superior medial calf, area pain, there is an area of edema with debris using calcifications beneath the skin.   Electronically Signed   By: Abelardo Diesel M.D.   On: 10/22/2014 16:41     EKG Interpretation   Date/Time:  Monday October 22 2014 15:41:44 EDT Ventricular Rate:  66 PR Interval:  156 QRS Duration: 88 QT Interval:  380 QTC Calculation: 398 R Axis:   6 Text Interpretation:  Sinus rhythm with occasional Premature ventricular   complexes Otherwise normal ECG No significant change was found Confirmed  by Wyvonnia Dusky  MD, Hiedi Touchton ([id number]) on 10/22/2014 3:43:09 PM      MDM   Final diagnoses:  Leg swelling   Pain in left calf since this morning. No trauma. History of DVT no longer on Coumadin due to GI bleeding she had in December. She has an IVC filter in place.  Also admits to intermittent chest pressure for several weeks that comes and goes lasting several minutes at a time. No shortness of breath, nausea, diaphoresis or vomiting. Pain does not radiate. Denies any cardiac history. She reports a stress test about 5 years ago.  Electrolytes normal. CK 101.  EKG unchanged  with PVCs. Troponin negative.  Ultrasound was negative for DVT.  Patient's chest pain has been coming and going for several weeks. It is reproducible on exam. She has no hypoxia or tachycardia. Low suspicion for ACS or PE.  D-dimer below age adjusted cut off. CTPE not indicated. Follow up with cardiology for repeat stress test.  Return precautions discussed.   I personally performed the services described in this documentation, which was scribed in my presence. The recorded information has been reviewed and is accurate.      Ezequiel Essex, MD 10/22/14 2038

## 2014-10-22 NOTE — ED Notes (Signed)
Pain in her left calf. She was taken off Coumadin in December for previous blood blots.

## 2014-12-23 ENCOUNTER — Encounter (HOSPITAL_BASED_OUTPATIENT_CLINIC_OR_DEPARTMENT_OTHER): Payer: Self-pay | Admitting: Emergency Medicine

## 2014-12-23 ENCOUNTER — Emergency Department (HOSPITAL_BASED_OUTPATIENT_CLINIC_OR_DEPARTMENT_OTHER): Payer: Medicare Other

## 2014-12-23 ENCOUNTER — Emergency Department (HOSPITAL_BASED_OUTPATIENT_CLINIC_OR_DEPARTMENT_OTHER)
Admission: EM | Admit: 2014-12-23 | Discharge: 2014-12-23 | Disposition: A | Discharge status: Home / Self Care | Payer: Medicare Other | Attending: Emergency Medicine | Admitting: Emergency Medicine

## 2014-12-23 DIAGNOSIS — Y998 Other external cause status: Secondary | ICD-10-CM | POA: Diagnosis not present

## 2014-12-23 DIAGNOSIS — Y9389 Activity, other specified: Secondary | ICD-10-CM | POA: Insufficient documentation

## 2014-12-23 DIAGNOSIS — Y929 Unspecified place or not applicable: Secondary | ICD-10-CM | POA: Diagnosis not present

## 2014-12-23 DIAGNOSIS — I10 Essential (primary) hypertension: Secondary | ICD-10-CM | POA: Insufficient documentation

## 2014-12-23 DIAGNOSIS — K219 Gastro-esophageal reflux disease without esophagitis: Secondary | ICD-10-CM | POA: Diagnosis not present

## 2014-12-23 DIAGNOSIS — R0781 Pleurodynia: Secondary | ICD-10-CM

## 2014-12-23 DIAGNOSIS — E119 Type 2 diabetes mellitus without complications: Secondary | ICD-10-CM | POA: Insufficient documentation

## 2014-12-23 DIAGNOSIS — W010XXA Fall on same level from slipping, tripping and stumbling without subsequent striking against object, initial encounter: Secondary | ICD-10-CM | POA: Insufficient documentation

## 2014-12-23 DIAGNOSIS — S299XXA Unspecified injury of thorax, initial encounter: Secondary | ICD-10-CM | POA: Diagnosis present

## 2014-12-23 DIAGNOSIS — Z79899 Other long term (current) drug therapy: Secondary | ICD-10-CM | POA: Insufficient documentation

## 2014-12-23 MED ORDER — ACETAMINOPHEN 500 MG PO TABS
ORAL_TABLET | ORAL | Status: AC
  Filled 2014-12-23: qty 2

## 2014-12-23 MED ORDER — ACETAMINOPHEN 500 MG PO TABS
1000.0000 mg | ORAL_TABLET | Freq: Once | ORAL | Status: AC
  Administered 2014-12-23: 1000 mg via ORAL

## 2014-12-23 NOTE — ED Notes (Signed)
Patient transported to X-ray 

## 2014-12-23 NOTE — ED Provider Notes (Signed)
CSN: [id number]     Arrival date & time 12/23/14  1853 History   First MD Initiated Contact with Patient 12/23/14 1907     Chief Complaint  Patient presents with  . Fall     (Consider location/radiation/quality/duration/timing/severity/associated sxs/prior Treatment) HPI Comments: 79 year old female presenting with right-sided rib pain and pain with deep inspiration after accidentally tripping and falling over her granddaughter at about 12 PM today. No head injury. States she landed directly onto her right ribs. Since then, she experienced sharp, shooting pain in her right ribs, worse with deep inspiration and certain movements. No alleviating factors tried. Reports shortness of breath due to the pain with breathing.  Patient is a 79 y.o. female presenting with fall. The history is provided by the patient and a relative.  Fall This is a new problem. The current episode started today. The problem has been unchanged. Associated symptoms comments: R sided rib pain.. Exacerbated by: Deep breaths and certain movements. She has tried nothing for the symptoms.    Past Medical History  Diagnosis Date  . Diabetes mellitus without complication   . Hypertension   . GERD (gastroesophageal reflux disease)    Past Surgical History  Procedure Laterality Date  . Replacement total knee     History reviewed. No pertinent family history. Social History  Substance Use Topics  . Smoking status: Never Smoker   . Smokeless tobacco: None  . Alcohol Use: No   OB History    No data available     Review of Systems  Respiratory: Positive for shortness of breath.   Musculoskeletal:       + R sided rib pain.  All other systems reviewed and are negative.     Allergies  Ativan; Ciprofloxacin hcl; Codeine; Darvon; Gluten meal; Nitrofuran derivatives; and Phenergan  Home Medications   Prior to Admission medications   Medication Sig Start Date End Date Taking? Authorizing Provider  acetaminophen  (TYLENOL) 500 MG tablet Take 1 tablet (500 mg total) by mouth every 6 (six) hours as needed for pain. 06/24/12  Yes [name], MD  chlordiazePOXIDE (LIBRIUM) 25 MG capsule Take 25 mg by mouth 3 (three) times daily as needed.   Yes Historical Provider, MD  esomeprazole (NEXIUM) 40 MG capsule Take 40 mg by mouth daily before breakfast.   Yes Historical Provider, MD  amLODipine-benazepril (LOTREL) 5-10 MG per capsule Take 1 capsule by mouth daily.    Historical Provider, MD  benzonatate (TESSALON PERLES) 100 MG capsule Take 1 capsule (100 mg total) by mouth 3 (three) times daily as needed for cough. 11/18/13   [name], MD  doxycycline (VIBRAMYCIN) 100 MG capsule Take 1 capsule (100 mg total) by mouth 2 (two) times daily. One po bid x 7 days 11/18/13   [name], MD  levothyroxine (SYNTHROID, LEVOTHROID) 100 MCG tablet Take 100 mcg by mouth daily.    Historical Provider, MD  losartan (COZAAR) 100 MG tablet Take 100 mg by mouth daily.    Historical Provider, MD  mometasone (NASONEX) 50 MCG/ACT nasal spray Place 2 sprays into the nose daily.    Historical Provider, MD  oxycodone (OXY-IR) 5 MG capsule Take 1 capsule (5 mg total) by mouth every 6 (six) hours as needed. 05/01/12   [name], MD  oxyCODONE-acetaminophen (PERCOCET/ROXICET) 5-325 MG per tablet Take 1 tablet by mouth every 4 (four) hours as needed for pain. 10/20/12   Pattricia Boss, MD  pentosan polysulfate (ELMIRON) 100 MG capsule Take 100 mg by  mouth 3 (three) times daily before meals.    Historical Provider, MD  pravastatin (PRAVACHOL) 20 MG tablet Take 20 mg by mouth daily.    Historical Provider, MD  risedronate (ACTONEL) 150 MG tablet Take 150 mg by mouth every 30 (thirty) days. with water on empty stomach, nothing by mouth or lie down for next 30 minutes.    Historical Provider, MD  Travoprost, BAK Free, (TRAVATAN) 0.004 % SOLN ophthalmic solution 1 drop at bedtime.    Historical Provider, MD   BP 136/74 mmHg  Pulse 95   Temp(Src) 98.4 F (36.9 C) (Oral)  Resp 18  Ht 5' 1"  (1.549 m)  Wt 153 lb (69.4 kg)  BMI 28.92 kg/m2  SpO2 98% Physical Exam  Constitutional: She is oriented to person, place, and time. She appears well-developed and well-nourished. No distress.  HENT:  Head: Normocephalic and atraumatic.  Mouth/Throat: Oropharynx is clear and moist.  Eyes: Conjunctivae and EOM are normal.  Neck: Normal range of motion. Neck supple.  Cardiovascular: Normal rate, regular rhythm and normal heart sounds.   Pulmonary/Chest: Effort normal and breath sounds normal. No respiratory distress. She has no decreased breath sounds.  TTP R mid-lower antero-lateral ribs. No edema, ecchymosis or step-off. No crepitus.  Musculoskeletal: Normal range of motion. She exhibits no edema.  Neurological: She is alert and oriented to person, place, and time. No sensory deficit.  Skin: Skin is warm and dry.  Psychiatric: She has a normal mood and affect. Her behavior is normal.  Nursing note and vitals reviewed.   ED Course  Procedures (including critical care time) Labs Review Labs Reviewed - No data to display  Imaging Review Dg Ribs Unilateral W/chest Right  12/23/2014   CLINICAL DATA:  79 year old female with rib pain over the right anterior and lateral ribs after a fall today.  EXAM: RIGHT RIBS AND CHEST - 3+ VIEW  COMPARISON:  Chest x-ray 10/22/2014.  FINDINGS: Lung volumes are normal. No consolidative airspace disease. No pleural effusions. No pneumothorax. Multiple tiny calcified granulomas in the lungs bilaterally, particularly in the lung apices. No other suspicious appearing pulmonary nodule or mass noted. Pulmonary vasculature and the cardiomediastinal silhouette are within normal limits. Atherosclerosis in the thoracic aorta. Numerous surgical clips around the area of the gastroesophageal junction.  Dedicated views of the right ribs demonstrate no acute displaced fractures.  IMPRESSION: 1. No acute right-sided rib  fractures. 2. No signs of significant acute traumatic injury to the thorax. 3. Atherosclerosis.   Electronically Signed   By: Vinnie Langton M.D.   On: 12/23/2014 19:45   I have personally reviewed and evaluated these images and lab results as part of my medical decision-making.   EKG Interpretation None      MDM   Final diagnoses:  Rib pain on right side   NAD. AFVSS. Xray negative. No hypoxia. Advised ice NSAIDs/tylenol. Pt refusing anything strong for pain despite being offered. F/u with PCP. Stable for d/c. Return precautions given. Patient states understanding of treatment care plan and is agreeable.  Discussed with attending Dr. Mingo Amber who also evaluated patient and agrees with plan of care.  Carman Ching, PA-C 12/23/14 2003  Evelina Bucy, MD 12/23/14 2008

## 2014-12-23 NOTE — ED Notes (Signed)
Pt reports that she tripped over her granddaughter this afternoon at party, as day went on she noticed difficutly breathing and pain under her right breast

## 2014-12-23 NOTE — Discharge Instructions (Signed)
You may take Tylenol every 6 hours as needed for pain. Apply ice to the right side of your ribs.  Rib Contusion A rib contusion (bruise) can occur by a blow to the chest or by a fall against a hard object. Usually these will be much better in a couple weeks. If X-rays were taken today and there are no broken bones (fractures), the diagnosis of bruising is made. However, broken ribs may not show up for several days, or may be discovered later on a routine X-ray when signs of healing show up. If this happens to you, it does not mean that something was missed on the X-ray, but simply that it did not show up on the first X-rays. Earlier diagnosis will not usually change the treatment. HOME CARE INSTRUCTIONS   Avoid strenuous activity. Be careful during activities and avoid bumping the injured ribs. Activities that pull on the injured ribs and cause pain should be avoided, if possible.  For the first day or two, an ice pack used every 20 minutes while awake may be helpful. Put ice in a plastic bag and put a towel between the bag and the skin.  Eat a normal, well-balanced diet. Drink plenty of fluids to avoid constipation.  Take deep breaths several times a day to keep lungs free of infection. Try to cough several times a day. Splint the injured area with a pillow while coughing to ease pain. Coughing can help prevent pneumonia.  Wear a rib belt or binder only if told to do so by your caregiver. If you are wearing a rib belt or binder, you must do the breathing exercises as directed by your caregiver. If not used properly, rib belts or binders restrict breathing which can lead to pneumonia.  Only take over-the-counter or prescription medicines for pain, discomfort, or fever as directed by your caregiver. SEEK MEDICAL CARE IF:   You or your child has an oral temperature above 102 F (38.9 C).  Your baby is older than 3 months with a rectal temperature of 100.5 F (38.1 C) or higher for more than 1  day.  You develop a cough, with thick or bloody sputum. SEEK IMMEDIATE MEDICAL CARE IF:   You have difficulty breathing.  You feel sick to your stomach (nausea), have vomiting or belly (abdominal) pain.  You have worsening pain, not controlled with medications, or there is a change in the location of the pain.  You develop sweating or radiation of the pain into the arms, jaw or shoulders, or become light headed or faint.  You or your child has an oral temperature above 102 F (38.9 C), not controlled by medicine.  Your or your baby is older than 3 months with a rectal temperature of 102 F (38.9 C) or higher.  Your baby is 21 months old or younger with a rectal temperature of 100.4 F (38 C) or higher. MAKE SURE YOU:   Understand these instructions.  Will watch your condition.  Will get help right away if you are not doing well or get worse. Document Released: 12/30/2000 Document Revised: 08/01/2012 Document Reviewed: 11/23/2007 [facility] Patient Information 2015 [place], [place]. This information is not intended to replace advice given to you by your health care provider. Make sure you discuss any questions you have with your health care provider.

## 2015-01-01 ENCOUNTER — Emergency Department (HOSPITAL_BASED_OUTPATIENT_CLINIC_OR_DEPARTMENT_OTHER)
Admission: EM | Admit: 2015-01-01 | Discharge: 2015-01-01 | Disposition: A | Discharge status: Home / Self Care | Payer: Medicare Other | Attending: Emergency Medicine | Admitting: Emergency Medicine

## 2015-01-01 ENCOUNTER — Encounter (HOSPITAL_BASED_OUTPATIENT_CLINIC_OR_DEPARTMENT_OTHER): Payer: Self-pay | Admitting: *Deleted

## 2015-01-01 ENCOUNTER — Emergency Department (HOSPITAL_BASED_OUTPATIENT_CLINIC_OR_DEPARTMENT_OTHER): Payer: Medicare Other

## 2015-01-01 DIAGNOSIS — Y9289 Other specified places as the place of occurrence of the external cause: Secondary | ICD-10-CM | POA: Diagnosis not present

## 2015-01-01 DIAGNOSIS — Z79899 Other long term (current) drug therapy: Secondary | ICD-10-CM | POA: Diagnosis not present

## 2015-01-01 DIAGNOSIS — W1839XA Other fall on same level, initial encounter: Secondary | ICD-10-CM | POA: Diagnosis not present

## 2015-01-01 DIAGNOSIS — S79911A Unspecified injury of right hip, initial encounter: Secondary | ICD-10-CM | POA: Insufficient documentation

## 2015-01-01 DIAGNOSIS — W19XXXA Unspecified fall, initial encounter: Secondary | ICD-10-CM

## 2015-01-01 DIAGNOSIS — I10 Essential (primary) hypertension: Secondary | ICD-10-CM | POA: Insufficient documentation

## 2015-01-01 DIAGNOSIS — K219 Gastro-esophageal reflux disease without esophagitis: Secondary | ICD-10-CM | POA: Diagnosis not present

## 2015-01-01 DIAGNOSIS — E119 Type 2 diabetes mellitus without complications: Secondary | ICD-10-CM | POA: Diagnosis not present

## 2015-01-01 DIAGNOSIS — Y9389 Activity, other specified: Secondary | ICD-10-CM | POA: Insufficient documentation

## 2015-01-01 DIAGNOSIS — Y998 Other external cause status: Secondary | ICD-10-CM | POA: Insufficient documentation

## 2015-01-01 MED ORDER — OXYCODONE HCL 5 MG PO TABS
5.0000 mg | ORAL_TABLET | Freq: Once | ORAL | Status: AC
  Administered 2015-01-01: 5 mg via ORAL
  Filled 2015-01-01: qty 1

## 2015-01-01 MED ORDER — CYCLOBENZAPRINE HCL 5 MG PO TABS: 5.0000 mg | ORAL_TABLET | Freq: Two times a day (BID) | ORAL | Status: DC | PRN

## 2015-01-01 MED ORDER — OXYCODONE-ACETAMINOPHEN 5-325 MG PO TABS: 1.0000 | ORAL_TABLET | ORAL | Status: DC | PRN

## 2015-01-01 MED ORDER — ACETAMINOPHEN 325 MG PO TABS
650.0000 mg | ORAL_TABLET | Freq: Once | ORAL | Status: AC
  Administered 2015-01-01: 650 mg via ORAL
  Filled 2015-01-01: qty 2

## 2015-01-01 NOTE — ED Notes (Signed)
Pt reports fall on labor day weekend, seen here for rib pain, states at the time her hips did not hurt, pt reports she now has right hip pain since the fall that worsened last night.

## 2015-01-01 NOTE — ED Provider Notes (Signed)
CSN: [id number]     Arrival date & time 01/01/15  0908 History   First MD Initiated Contact with Patient 01/01/15 [phone]     Chief Complaint  Patient presents with  . Hip Pain     (Consider location/radiation/quality/duration/timing/severity/associated sxs/prior Treatment) Patient is a 79 y.o. female presenting with leg pain.  Leg Pain Location:  Hip Time since incident:  10 days Injury: yes   Mechanism of injury: fall   Hip location:  R hip Pain details:    Quality: grabbing.   Radiates to:  Does not radiate   Severity:  Severe   Onset quality:  Gradual   Duration:  10 days   Timing:  Constant   Progression:  Worsening Chronicity:  New Relieved by:  Nothing Exacerbated by: movement. trying to stand from sitting. Ineffective treatments:  NSAIDs Associated symptoms: no fever, no muscle weakness, no numbness, no swelling and no tingling   Associated symptoms comment:  No bruising.   Past Medical History  Diagnosis Date  . Diabetes mellitus without complication   . Hypertension   . GERD (gastroesophageal reflux disease)    Past Surgical History  Procedure Laterality Date  . Replacement total knee     History reviewed. No pertinent family history. Social History  Substance Use Topics  . Smoking status: Never Smoker   . Smokeless tobacco: None  . Alcohol Use: No   OB History    No data available     Review of Systems  Constitutional: Negative for fever.  All other systems reviewed and are negative.     Allergies  Ativan; Ciprofloxacin hcl; Codeine; Darvon; Gluten meal; Nitrofuran derivatives; and Phenergan  Home Medications   Prior to Admission medications   Medication Sig Start Date End Date Taking? Authorizing Provider  acetaminophen (TYLENOL) 500 MG tablet Take 1 tablet (500 mg total) by mouth every 6 (six) hours as needed for pain. 06/24/12   [name], MD  amLODipine-benazepril (LOTREL) 5-10 MG per capsule Take 1 capsule by mouth daily.     Historical Provider, MD  benzonatate (TESSALON PERLES) 100 MG capsule Take 1 capsule (100 mg total) by mouth 3 (three) times daily as needed for cough. 11/18/13   [name], MD  chlordiazePOXIDE (LIBRIUM) 25 MG capsule Take 25 mg by mouth 3 (three) times daily as needed.    Historical Provider, MD  doxycycline (VIBRAMYCIN) 100 MG capsule Take 1 capsule (100 mg total) by mouth 2 (two) times daily. One po bid x 7 days 11/18/13   [name], MD  esomeprazole (NEXIUM) 40 MG capsule Take 40 mg by mouth daily before breakfast.    Historical Provider, MD  levothyroxine (SYNTHROID, LEVOTHROID) 100 MCG tablet Take 100 mcg by mouth daily.    Historical Provider, MD  losartan (COZAAR) 100 MG tablet Take 100 mg by mouth daily.    Historical Provider, MD  mometasone (NASONEX) 50 MCG/ACT nasal spray Place 2 sprays into the nose daily.    Historical Provider, MD  oxycodone (OXY-IR) 5 MG capsule Take 1 capsule (5 mg total) by mouth every 6 (six) hours as needed. 05/01/12   [name], MD  oxyCODONE-acetaminophen (PERCOCET/ROXICET) 5-325 MG per tablet Take 1 tablet by mouth every 4 (four) hours as needed for pain. 10/20/12   Pattricia Boss, MD  pentosan polysulfate (ELMIRON) 100 MG capsule Take 100 mg by mouth 3 (three) times daily before meals.    Historical Provider, MD  pravastatin (PRAVACHOL) 20 MG tablet Take 20 mg by mouth  daily.    Historical Provider, MD  risedronate (ACTONEL) 150 MG tablet Take 150 mg by mouth every 30 (thirty) days. with water on empty stomach, nothing by mouth or lie down for next 30 minutes.    Historical Provider, MD  Travoprost, BAK Free, (TRAVATAN) 0.004 % SOLN ophthalmic solution 1 drop at bedtime.    Historical Provider, MD   BP 177/97 mmHg  Pulse 82  Temp(Src) 98.3 F (36.8 C) (Oral)  Resp 18  Ht 5' 1"  (1.549 m)  Wt 153 lb (69.4 kg)  BMI 28.92 kg/m2  SpO2 100% Physical Exam  Constitutional: She is oriented to person, place, and time. She appears well-developed and  well-nourished. No distress.  HENT:  Head: Normocephalic and atraumatic.  Mouth/Throat: Oropharynx is clear and moist.  Eyes: Conjunctivae are normal. Pupils are equal, round, and reactive to light. No scleral icterus.  Neck: Neck supple.  Cardiovascular: Normal rate, regular rhythm, normal heart sounds and intact distal pulses.   No murmur heard. Pulmonary/Chest: Effort normal and breath sounds normal. No stridor. No respiratory distress. She has no rales.  Abdominal: Soft. Bowel sounds are normal. She exhibits no distension. There is no tenderness.  Musculoskeletal: Normal range of motion.       Right hip: She exhibits tenderness (NV intact distally) and bony tenderness. She exhibits normal range of motion (pain w ROM), normal strength, no swelling, no crepitus, no deformity and no laceration.  Neurological: She is alert and oriented to person, place, and time.  Skin: Skin is warm and dry. No rash noted.  Psychiatric: She has a normal mood and affect. Her behavior is normal.  Nursing note and vitals reviewed.   ED Course  Procedures (including critical care time) Labs Review Labs Reviewed - No data to display  Imaging Review Dg Hip Unilat With Pelvis 2-3 Views Right  01/01/2015   CLINICAL DATA:  Fall 1 week ago. RIGHT hip pain. Initial encounter.  EXAM: DG HIP (WITH OR WITHOUT PELVIS) 2-3V RIGHT  COMPARISON:  None.  FINDINGS: Mild RIGHT hip osteoarthritis is present with marginal osteophytes. Phleboliths and calcified granuloma are present over the RIGHT pelvis. No displaced fracture. Proximal femoral shaft appears normal. Obturator rings are normal. Lower lumbar spondylosis. Mild LEFT hip osteoarthritis is incidentally noted. Iliofemoral atherosclerosis.  IMPRESSION: Bilateral hip osteoarthritis.  No acute abnormality.   Electronically Signed   By: Dereck Ligas M.D.   On: 01/01/2015 10:28   I have personally reviewed and evaluated these images and lab results as part of my medical  decision-making.   EKG Interpretation None      MDM   Final diagnoses:  Hip injury, right, initial encounter    79 yo female who fell about 10 days ago.  She did not appreciate significant right hip pain then, but it has gradually gotten worse and worse since.  Likely MSK pain/spasm from altered mechanics.  Plain film negative and would likely have picked up a fracture this remote from injury.  Plan supportive treatment and follow up.    She ambulated prior to dc  [name], MD 01/01/15 1556

## 2015-03-24 ENCOUNTER — Encounter (HOSPITAL_BASED_OUTPATIENT_CLINIC_OR_DEPARTMENT_OTHER): Payer: Self-pay | Admitting: Emergency Medicine

## 2015-03-24 ENCOUNTER — Emergency Department (HOSPITAL_BASED_OUTPATIENT_CLINIC_OR_DEPARTMENT_OTHER): Payer: Medicare Other

## 2015-03-24 ENCOUNTER — Emergency Department (HOSPITAL_BASED_OUTPATIENT_CLINIC_OR_DEPARTMENT_OTHER)
Admission: EM | Admit: 2015-03-24 | Discharge: 2015-03-24 | Disposition: A | Discharge status: Home / Self Care | Payer: Medicare Other | Attending: Emergency Medicine | Admitting: Emergency Medicine

## 2015-03-24 DIAGNOSIS — S99921A Unspecified injury of right foot, initial encounter: Secondary | ICD-10-CM | POA: Diagnosis present

## 2015-03-24 DIAGNOSIS — Y998 Other external cause status: Secondary | ICD-10-CM | POA: Insufficient documentation

## 2015-03-24 DIAGNOSIS — Y9289 Other specified places as the place of occurrence of the external cause: Secondary | ICD-10-CM | POA: Diagnosis not present

## 2015-03-24 DIAGNOSIS — X58XXXA Exposure to other specified factors, initial encounter: Secondary | ICD-10-CM | POA: Insufficient documentation

## 2015-03-24 DIAGNOSIS — S93601A Unspecified sprain of right foot, initial encounter: Secondary | ICD-10-CM | POA: Insufficient documentation

## 2015-03-24 DIAGNOSIS — K219 Gastro-esophageal reflux disease without esophagitis: Secondary | ICD-10-CM | POA: Diagnosis not present

## 2015-03-24 DIAGNOSIS — E119 Type 2 diabetes mellitus without complications: Secondary | ICD-10-CM | POA: Diagnosis not present

## 2015-03-24 DIAGNOSIS — Z79899 Other long term (current) drug therapy: Secondary | ICD-10-CM | POA: Insufficient documentation

## 2015-03-24 DIAGNOSIS — I10 Essential (primary) hypertension: Secondary | ICD-10-CM | POA: Insufficient documentation

## 2015-03-24 DIAGNOSIS — Y9389 Activity, other specified: Secondary | ICD-10-CM | POA: Diagnosis not present

## 2015-03-24 MED ORDER — OXYCODONE-ACETAMINOPHEN 5-325 MG PO TABS
1.0000 | ORAL_TABLET | Freq: Once | ORAL | Status: AC
Start: 2015-03-24 — End: 2015-03-24
  Administered 2015-03-24: 1 via ORAL
  Filled 2015-03-24: qty 1

## 2015-03-24 MED ORDER — OXYCODONE-ACETAMINOPHEN 5-325 MG PO TABS: 1.0000 | ORAL_TABLET | ORAL | Status: DC | PRN

## 2015-03-24 NOTE — ED Provider Notes (Signed)
CSN: [id number]     Arrival date & time 03/24/15  1718 History  By signing my name below, I, Amy Hickman, attest that this documentation has been prepared under the direction and in the presence of Amy Rice, MD. Electronically Signed: Julien Hickman, ED Scribe. 03/24/2015. 6:15 PM.    Chief Complaint  Patient presents with  . Foot Pain     The history is provided by the patient. No language interpreter was used.   HPI Comments: Amy Hickman is a 79 y.o. female who presents to the Emergency Department complaining of acute right foot pain onset three days ago while walking. Pt has associated swelling to her right foot. Pt is unable to bear weight or pressure to her right foot secondary to pain. She reports taking Tylenol to alleviate the pain with minimal relief. Pt has been applying heat to her right foot with little elevation to help with the swelling. No fever, chills, myalgias, rash or malaise.  Past Medical History  Diagnosis Date  . Diabetes mellitus without complication ([place])   . Hypertension   . GERD (gastroesophageal reflux disease)    Past Surgical History  Procedure Laterality Date  . Replacement total knee     History reviewed. No pertinent family history. Social History  Substance Use Topics  . Smoking status: Never Smoker   . Smokeless tobacco: None  . Alcohol Use: No   OB History    No data available     Review of Systems  Constitutional: Negative for fever, chills and fatigue.  Gastrointestinal: Negative for nausea and vomiting.  Musculoskeletal: Positive for joint swelling and arthralgias. Negative for myalgias.  Skin: Positive for color change. Negative for rash.  Neurological: Negative for dizziness, weakness, light-headedness, numbness and headaches.  All other systems reviewed and are negative.     Allergies  Ativan; Ciprofloxacin hcl; Codeine; Darvon; Gluten meal; Nitrofuran derivatives; and Phenergan  Home Medications   Prior to  Admission medications   Medication Sig Start Date End Date Taking? Authorizing Provider  furosemide (LASIX) 40 MG tablet Take 40 mg by mouth.   Yes Historical Provider, MD  metoprolol (LOPRESSOR) 100 MG tablet Take 75 mg by mouth daily.   Yes Historical Provider, MD  sodium chloride 1 G tablet Take 1 g by mouth 3 (three) times daily.   Yes Historical Provider, MD  acetaminophen (TYLENOL) 500 MG tablet Take 1 tablet (500 mg total) by mouth every 6 (six) hours as needed for pain. 06/24/12   [name], MD  amLODipine-benazepril (LOTREL) 5-10 MG per capsule Take 1 capsule by mouth daily.    Historical Provider, MD  benzonatate (TESSALON PERLES) 100 MG capsule Take 1 capsule (100 mg total) by mouth 3 (three) times daily as needed for cough. 11/18/13   [name], MD  chlordiazePOXIDE (LIBRIUM) 25 MG capsule Take 25 mg by mouth 3 (three) times daily as needed.    Historical Provider, MD  cyclobenzaprine (FLEXERIL) 5 MG tablet Take 1 tablet (5 mg total) by mouth 2 (two) times daily as needed for muscle spasms. 01/01/15   [name], MD  doxycycline (VIBRAMYCIN) 100 MG capsule Take 1 capsule (100 mg total) by mouth 2 (two) times daily. One po bid x 7 days 11/18/13   [name], MD  esomeprazole (NEXIUM) 40 MG capsule Take 40 mg by mouth daily before breakfast.    Historical Provider, MD  levothyroxine (SYNTHROID, LEVOTHROID) 100 MCG tablet Take 100 mcg by mouth daily.    Historical Provider,  MD  losartan (COZAAR) 100 MG tablet Take 100 mg by mouth daily.    Historical Provider, MD  mometasone (NASONEX) 50 MCG/ACT nasal spray Place 2 sprays into the nose daily.    Historical Provider, MD  oxycodone (OXY-IR) 5 MG capsule Take 1 capsule (5 mg total) by mouth every 6 (six) hours as needed. 05/01/12   [name], MD  oxyCODONE-acetaminophen (PERCOCET/ROXICET) 5-325 MG tablet Take 1 tablet by mouth every 4 (four) hours as needed for severe pain. 03/24/15   Amy Rice, MD  pentosan polysulfate  (ELMIRON) 100 MG capsule Take 100 mg by mouth 3 (three) times daily before meals.    Historical Provider, MD  pravastatin (PRAVACHOL) 20 MG tablet Take 20 mg by mouth daily.    Historical Provider, MD  risedronate (ACTONEL) 150 MG tablet Take 150 mg by mouth every 30 (thirty) days. with water on empty stomach, nothing by mouth or lie down for next 30 minutes.    Historical Provider, MD  Travoprost, BAK Free, (TRAVATAN) 0.004 % SOLN ophthalmic solution 1 drop at bedtime.    Historical Provider, MD   Triage vitals: BP 131/76 mmHg  Pulse 81  Temp(Src) 98.4 F (36.9 C) (Oral)  Resp 16  SpO2 99% Physical Exam  Constitutional: She is oriented to person, place, and time. She appears well-developed and well-nourished. No distress.  HENT:  Head: Normocephalic and atraumatic.  Mouth/Throat: Oropharynx is clear and moist.  Eyes: EOM are normal. Pupils are equal, round, and reactive to light.  Neck: Normal range of motion. Neck supple.  Cardiovascular: Normal rate and regular rhythm.  Exam reveals no gallop.   Pulmonary/Chest: Effort normal and breath sounds normal.  Abdominal: Soft. Bowel sounds are normal.  Musculoskeletal: Normal range of motion. She exhibits tenderness. She exhibits no edema.  Patient has right lateral mid foot swelling with tenderness to palpation over the lateral metatarsals. There is ecchymosis in the area. No tenderness to palpation over the calcaneous, ankle, proximal fibula. Distal pulses intact. Good distal cap refill  Neurological: She is alert and oriented to person, place, and time.  Sensation is fully intact. Moves all extremities without deficit.  Skin: Skin is warm and dry. No rash noted. No erythema.  Psychiatric: She has a normal mood and affect. Her behavior is normal.  Nursing note and vitals reviewed.   ED Course  Procedures  DIAGNOSTIC STUDIES: Oxygen Saturation is 99% on RA, normal by my interpretation.  COORDINATION OF CARE:  6:12 PM Discussed  treatment plan which includes x-ray of right foot with pt at bedside and pt agreed to plan.  Labs Review Labs Reviewed - No data to display  Imaging Review Dg Foot Complete Right  03/24/2015  CLINICAL DATA:  Acute onset of worsening right foot pain and swelling. Unable to bear weight or pressure on the right foot. Initial encounter. EXAM: RIGHT FOOT COMPLETE - 3+ VIEW COMPARISON:  None. FINDINGS: There is no evidence of fracture or dislocation. The joint spaces are preserved. There is no evidence of talar subluxation; the subtalar joint is unremarkable in appearance. An os naviculare is noted. A plantar calcaneal spur is seen. Diffuse vascular calcifications are noted. Mild dorsal soft tissue swelling is noted at the forefoot. IMPRESSION: 1. No evidence of fracture or dislocation. 2. Os naviculare noted. 3. Diffuse vascular calcifications seen. Electronically Signed   By: Garald Balding M.D.   On: 03/24/2015 18:34   I have personally reviewed and evaluated these images and lab results as part of  my medical decision-making.   EKG Interpretation None      MDM   Final diagnoses:  Foot sprain, right, initial encounter   I personally performed the services described in this documentation, which was scribed in my presence. The recorded information has been reviewed and is accurate.    No obvious fracture. Placed in orthotics boot with worth of follow-up. Return precautions given.  Amy Rice, MD 03/26/15 0040

## 2015-03-24 NOTE — ED Notes (Signed)
Patient states that she was walking on Friday and she states that all of sudden her foot started to hurt and she was unable to walk any longer. The patient reports that her right foot is swollen

## 2015-03-24 NOTE — Discharge Instructions (Signed)
Foot Sprain A foot sprain is an injury to one of the strong bands of tissue (ligaments) that connect and support the many bones in your feet. The ligament can be stretched too much or it can tear. A tear can be either partial or complete. The severity of the sprain depends on how much of the ligament was damaged or torn. CAUSES A foot sprain is usually caused by suddenly twisting or pivoting your foot. RISK FACTORS This injury is more likely to occur in people who:  Play a sport, such as basketball or football.  Exercise or play a sport without warming up.  Start a new workout or sport.  Suddenly increase how long or hard they exercise or play a sport. SYMPTOMS Symptoms of this condition start soon after an injury and include:  Pain, especially in the arch of the foot.  Bruising.  Swelling.  Inability to walk or use the foot to support body weight. DIAGNOSIS This condition is diagnosed with a medical history and physical exam. You may also have imaging tests, such as:  X-rays to make sure there are no broken bones (fractures).  MRI to see if the ligament has torn. TREATMENT Treatment varies depending on the severity of your sprain. Mild sprains can be treated with rest, ice, compression, and elevation (RICE). If your ligament is overstretched or partially torn, treatment usually involves keeping your foot in a fixed position (immobilization) for a period of time. To help you do this, your health care provider will apply a bandage, splint, or walking boot to keep your foot from moving until it heals. You may also be advised to use crutches or a scooter for a few weeks to avoid bearing weight on your foot while it is healing. If your ligament is fully torn, you may need surgery to reconnect the ligament to the bone. After surgery, a cast or splint will be applied and will need to stay on your foot while it heals. Your health care provider may also suggest exercises or physical therapy  to strengthen your foot. HOME CARE INSTRUCTIONS If You Have a Bandage, Splint, or Walking Boot:  Wear it as directed by your health care provider. Remove it only as directed by your health care provider.  Loosen the bandage, splint, or walking boot if your toes become numb and tingle, or if they turn cold and blue. Bathing  If your health care provider approves bathing and showering, cover the bandage or splint with a watertight plastic bag to protect it from water. Do not let the bandage or splint get wet. Managing Pain, Stiffness, and Swelling   If directed, apply ice to the injured area:  Put ice in a plastic bag.  Place a towel between your skin and the bag.  Leave the ice on for 20 minutes, 2-3 times per day.  Move your toes often to avoid stiffness and to lessen swelling.  Raise (elevate) the injured area above the level of your heart while you are sitting or lying down. Driving  Do not drive or operate heavy machinery while taking pain medicine.  Do not drive while wearing a bandage, splint, or walking boot on a foot that you use for driving. Activity  Rest as directed by your health care provider.  Do not use the injured foot to support your body weight until your health care provider says that you can. Use crutches or other supportive devices as directed by your health care provider.  Ask your health care  provider what activities are safe for you. Gradually increase how much and how far you walk until your health care provider says it is safe to return to full activity.  Do any exercise or physical therapy as directed by your health care provider. General Instructions  If a splint was applied, do not put pressure on any part of it until it is fully hardened. This may take several hours.  Take medicines only as directed by your health care provider. These include over-the-counter medicines and prescription medicines.  Keep all follow-up visits as directed by your  health care provider. This is important.  When you can walk without pain, wear supportive shoes that have stiff soles. Do not wear flip-flops, and do not walk barefoot. SEEK MEDICAL CARE IF:  Your pain is not controlled with medicine.  Your bruising or swelling gets worse or does not get better with treatment.  Your splint or walking boot is damaged. SEEK IMMEDIATE MEDICAL CARE IF:  Your foot is numb or blue.  Your foot feels colder than normal.   This information is not intended to replace advice given to you by your health care provider. Make sure you discuss any questions you have with your health care provider.   Document Released: 09/26/2001 Document Revised: 08/21/2014 Document Reviewed: 02/07/2014 [employer]vier Interactive Patient Education [employer].

## 2015-08-14 DIAGNOSIS — I82409 Acute embolism and thrombosis of unspecified deep veins of unspecified lower extremity: Secondary | ICD-10-CM | POA: Insufficient documentation

## 2015-08-14 DIAGNOSIS — M810 Age-related osteoporosis without current pathological fracture: Secondary | ICD-10-CM | POA: Insufficient documentation

## 2015-08-19 DIAGNOSIS — M159 Polyosteoarthritis, unspecified: Secondary | ICD-10-CM | POA: Insufficient documentation

## 2016-05-10 ENCOUNTER — Encounter (HOSPITAL_BASED_OUTPATIENT_CLINIC_OR_DEPARTMENT_OTHER): Payer: Self-pay | Admitting: Emergency Medicine

## 2016-05-10 ENCOUNTER — Emergency Department (HOSPITAL_BASED_OUTPATIENT_CLINIC_OR_DEPARTMENT_OTHER)
Admission: EM | Admit: 2016-05-10 | Discharge: 2016-05-10 | Disposition: A | Discharge status: Home / Self Care | Payer: Medicare Other | Attending: Emergency Medicine | Admitting: Emergency Medicine

## 2016-05-10 ENCOUNTER — Emergency Department (HOSPITAL_BASED_OUTPATIENT_CLINIC_OR_DEPARTMENT_OTHER): Payer: Medicare Other

## 2016-05-10 DIAGNOSIS — Z79899 Other long term (current) drug therapy: Secondary | ICD-10-CM | POA: Diagnosis not present

## 2016-05-10 DIAGNOSIS — M5431 Sciatica, right side: Secondary | ICD-10-CM | POA: Insufficient documentation

## 2016-05-10 DIAGNOSIS — M25551 Pain in right hip: Secondary | ICD-10-CM | POA: Diagnosis present

## 2016-05-10 DIAGNOSIS — I10 Essential (primary) hypertension: Secondary | ICD-10-CM | POA: Insufficient documentation

## 2016-05-10 HISTORY — DX: Interstitial cystitis (chronic) without hematuria: N30.10

## 2016-05-10 HISTORY — DX: Unspecified hearing loss, unspecified ear: H91.90

## 2016-05-10 NOTE — ED Provider Notes (Signed)
[facility] Provider Note   CSN: [id number] Arrival date & time: 05/10/16  1557  By signing my name below, I, Reola Mosher, attest that this documentation has been prepared under the direction and in the presence of [name], MD. Electronically Signed: Reola Mosher, ED Scribe. 05/10/16. 6:42 PM.  History   Chief Complaint Chief Complaint  Patient presents with  . Hip Pain    right   The history is provided by the patient. No language interpreter was used.   HPI Comments: Amy Hickman is a 81 y.o. female with a h/o HTN, arthritis, and GERD, who presents to the Emergency Department complaining of persistent, gradually worsening, right hip pain beginning two nights ago. She notes shooting radiation of her pain downward into her right thigh. Pt reports that two nights ago she woke up with her pain and had difficulty getting out of her bed d/t this, but her pain did not wake her up from sleep. No recent falls, trauma, or injury to the hip; however, one year ago she did fall onto this hip and experienced similar pain. No fractures were sustained at that time. She has been taking Tylenol and Naprosyn without significant relief of her pain. Pt's pain is exacerbated with ambulation, movement of the right leg, position changes, and bending over. She denies any focal numbness/weakness, bowel/bladder incontinence, abdominal pain, urgency, frequency, hematuria, dysuria, difficulty urinating, or any other associated symptoms.   Past Medical History:  Diagnosis Date  . GERD (gastroesophageal reflux disease)   . HOH (hard of hearing)   . Hypertension   . Interstitial cystitis    There are no active problems to display for this patient.  Past Surgical History:  Procedure Laterality Date  . ABDOMINAL HYSTERECTOMY    . APPENDECTOMY    . CHOLECYSTECTOMY    . REPLACEMENT TOTAL KNEE    . ROTATOR CUFF REPAIR Right   . TONSILLECTOMY     OB History    No data  available     Home Medications    Prior to Admission medications   Medication Sig Start Date End Date Taking? Authorizing Provider  acetaminophen (TYLENOL) 500 MG tablet Take 1 tablet (500 mg total) by mouth every 6 (six) hours as needed for pain. 06/24/12  Yes [name], MD  chlordiazePOXIDE (LIBRIUM) 25 MG capsule Take 25 mg by mouth 3 (three) times daily as needed.   Yes Historical Provider, MD  furosemide (LASIX) 40 MG tablet Take 40 mg by mouth.   Yes Historical Provider, MD  Lansoprazole (PREVACID PO) Take by mouth.   Yes Historical Provider, MD  levothyroxine (SYNTHROID, LEVOTHROID) 100 MCG tablet Take 75 mcg by mouth daily.    Yes Historical Provider, MD  metoprolol (LOPRESSOR) 100 MG tablet Take 75 mg by mouth daily.   Yes Historical Provider, MD  pentosan polysulfate (ELMIRON) 100 MG capsule Take 100 mg by mouth 3 (three) times daily before meals.   Yes Historical Provider, MD  pravastatin (PRAVACHOL) 20 MG tablet Take 20 mg by mouth daily.   Yes Historical Provider, MD  sodium chloride 1 G tablet Take 1 g by mouth 3 (three) times daily.   Yes Historical Provider, MD  Travoprost, BAK Free, (TRAVATAN) 0.004 % SOLN ophthalmic solution 1 drop at bedtime.   Yes Historical Provider, MD  amLODipine-benazepril (LOTREL) 5-10 MG per capsule Take 1 capsule by mouth daily.    Historical Provider, MD  benzonatate (TESSALON PERLES) 100 MG capsule Take 1 capsule (  100 mg total) by mouth 3 (three) times daily as needed for cough. 11/18/13   [name], MD  cyclobenzaprine (FLEXERIL) 5 MG tablet Take 1 tablet (5 mg total) by mouth 2 (two) times daily as needed for muscle spasms. 01/01/15   [name], MD  doxycycline (VIBRAMYCIN) 100 MG capsule Take 1 capsule (100 mg total) by mouth 2 (two) times daily. One po bid x 7 days 11/18/13   [name], MD  esomeprazole (NEXIUM) 40 MG capsule Take 40 mg by mouth daily before breakfast.    Historical Provider, MD  losartan (COZAAR) 100 MG tablet Take  100 mg by mouth daily.    Historical Provider, MD  mometasone (NASONEX) 50 MCG/ACT nasal spray Place 2 sprays into the nose daily.    Historical Provider, MD  oxycodone (OXY-IR) 5 MG capsule Take 1 capsule (5 mg total) by mouth every 6 (six) hours as needed. 05/01/12   [name], MD  oxyCODONE-acetaminophen (PERCOCET/ROXICET) 5-325 MG tablet Take 1 tablet by mouth every 4 (four) hours as needed for severe pain. 03/24/15   [name], MD  risedronate (ACTONEL) 150 MG tablet Take 150 mg by mouth every 30 (thirty) days. with water on empty stomach, nothing by mouth or lie down for next 30 minutes.    Historical Provider, MD   Family History History reviewed. No pertinent family history.  Social History Social History  Substance Use Topics  . Smoking status: Never Smoker  . Smokeless tobacco: Never Used  . Alcohol use No   Allergies   Ativan [lorazepam]; Ciprofloxacin hcl; Codeine; Darvon [propoxyphene hcl]; Gluten meal; Nitrofuran derivatives; and Phenergan [promethazine hcl]   Review of Systems Review of Systems  Constitutional: Negative for fever.  Gastrointestinal: Negative for abdominal pain, nausea and vomiting.  Genitourinary: Negative for difficulty urinating, dysuria, frequency, hematuria and urgency.  Musculoskeletal: Positive for arthralgias (right hip), joint swelling and myalgias. Negative for back pain and neck pain.  Skin: Negative for wound.  Neurological: Negative for weakness, numbness and headaches.       Negative for bowel/bladder incontinence.   All other systems reviewed and are negative.  Physical Exam Updated Vital Signs BP 121/70 (BP Location: Left Arm)   Pulse 61   Temp 97.7 F (36.5 C) (Oral)   Resp 19   Ht 5' 1"  (1.549 m)   Wt 144 lb (65.3 kg)   SpO2 96%   BMI 27.21 kg/m   Physical Exam  Constitutional: She is oriented to person, place, and time. She appears well-developed and well-nourished.  HENT:  Head: Normocephalic and atraumatic.    Neck: Normal range of motion. Neck supple.  Cardiovascular: Normal rate.   Pulmonary/Chest: Effort normal.  Abdominal: There is no tenderness.  Musculoskeletal: She exhibits tenderness. She exhibits no edema.  Tenderness over the right sciatic nerve and right lateral hip.  No pain on ROM of hip joint.  Patient has normal sensation and motor function distally. Pedal pulses are intact and symmetric. No pain over the spine.  Neurological: She is alert and oriented to person, place, and time.  Skin: Skin is warm and dry.  Psychiatric: She has a normal mood and affect.   ED Treatments / Results  DIAGNOSTIC STUDIES: Oxygen Saturation is 96% on RA, normal by my interpretation.   COORDINATION OF CARE: 6:39 PM-Discussed next steps with pt including DG right hip. Pt verbalized understanding and is agreeable with the plan.   Labs (all labs ordered are listed, but only abnormal results are displayed)  Labs Reviewed - No data to display  Radiology Dg Hip Unilat W Or Wo Pelvis 2-3 Views Right  Result Date: 05/10/2016 CLINICAL DATA:  Acute onset posterior right hip pain 4 days ago. Altered gait. No known injury. EXAM: DG HIP (WITH OR WITHOUT PELVIS) 2-3V RIGHT COMPARISON:  01/01/2015 FINDINGS: There is no evidence of acute fracture or dislocation. Mild-to-moderate axial right hip joint space narrowing and mild marginal osteophyte formation are stable to minimally increased. Limited evaluation of the left hip demonstrates similar findings. An IVC filter is partially visualized. IMPRESSION: No evidence of acute osseous abnormality. Bilateral hip osteoarthrosis. Electronically Signed   By: Logan Bores M.D.   On: 05/10/2016 17:59   Procedures Procedures   Medications Ordered in ED Medications - No data to display  Initial Impression / Assessment and Plan / ED Course  I have reviewed the triage vital signs and the nursing notes.  Pertinent labs & imaging results that were available during my care  of the patient were reviewed by me and considered in my medical decision making (see chart for details).     Patient presents with right hip pain. It seems to be consistent with sciatica. She's neurologically intact. Her x-rays are negative and she doesn't seem to have pain with range of motion in the hip joint. There is no associated abdominal or back pain. No other concerning findings for intra-abdominal pathology. She reports that she can continue taking Tylenol at home. Occasionally she will take Naprosyn as well. She doesn't 1 any prescriptions for other pain medications. She has tramadol at home but doesn't like to take it. I encouraged her to have follow-up with her PCP.  Final Clinical Impressions(s) / ED Diagnoses   Final diagnoses:  Sciatica of right side   New Prescriptions New Prescriptions   No medications on file   I personally performed the services described in this documentation, which was scribed in my presence.  The recorded information has been reviewed and considered.     [name], MD 05/10/16 [phone]

## 2016-05-10 NOTE — ED Triage Notes (Signed)
Patient c/o sudden onset right hip pain on Wednesday. Denies injuries. Patient is ambulatory into triage. Patient has taken tylenol and NSAIDS at home without relief. Patient daughter states the symptoms seem to be worsening.

## 2016-07-29 ENCOUNTER — Emergency Department (HOSPITAL_BASED_OUTPATIENT_CLINIC_OR_DEPARTMENT_OTHER): Payer: Medicare Other

## 2016-07-29 ENCOUNTER — Encounter (HOSPITAL_BASED_OUTPATIENT_CLINIC_OR_DEPARTMENT_OTHER): Payer: Self-pay

## 2016-07-29 ENCOUNTER — Emergency Department (HOSPITAL_BASED_OUTPATIENT_CLINIC_OR_DEPARTMENT_OTHER)
Admission: EM | Admit: 2016-07-29 | Discharge: 2016-07-30 | Disposition: A | Discharge status: Home / Self Care | Payer: Medicare Other | Attending: Emergency Medicine | Admitting: Emergency Medicine

## 2016-07-29 DIAGNOSIS — R112 Nausea with vomiting, unspecified: Secondary | ICD-10-CM | POA: Diagnosis present

## 2016-07-29 DIAGNOSIS — K529 Noninfective gastroenteritis and colitis, unspecified: Secondary | ICD-10-CM | POA: Insufficient documentation

## 2016-07-29 DIAGNOSIS — Z79899 Other long term (current) drug therapy: Secondary | ICD-10-CM | POA: Insufficient documentation

## 2016-07-29 DIAGNOSIS — I10 Essential (primary) hypertension: Secondary | ICD-10-CM | POA: Insufficient documentation

## 2016-07-29 DIAGNOSIS — Z791 Long term (current) use of non-steroidal anti-inflammatories (NSAID): Secondary | ICD-10-CM | POA: Diagnosis not present

## 2016-07-29 LAB — LIPASE, BLOOD: LIPASE: 21 U/L (ref 11–51)

## 2016-07-29 LAB — CBC WITH DIFFERENTIAL/PLATELET
BASOS PCT: 0 %
Basophils Absolute: 0 10*3/uL (ref 0.0–0.1)
Eosinophils Absolute: 0.1 10*3/uL (ref 0.0–0.7)
Eosinophils Relative: 2 %
HCT: 36.6 % (ref 36.0–46.0)
Hemoglobin: 12 g/dL (ref 12.0–15.0)
LYMPHS ABS: 1.8 10*3/uL (ref 0.7–4.0)
LYMPHS PCT: 25 %
MCH: 32.2 pg (ref 26.0–34.0)
MCHC: 32.8 g/dL (ref 30.0–36.0)
MCV: 98.1 fL (ref 78.0–100.0)
MONO ABS: 0.6 10*3/uL (ref 0.1–1.0)
MONOS PCT: 9 %
NEUTROS ABS: 4.7 10*3/uL (ref 1.7–7.7)
Neutrophils Relative %: 64 %
Platelets: 142 10*3/uL — ABNORMAL LOW (ref 150–400)
RBC: 3.73 MIL/uL — ABNORMAL LOW (ref 3.87–5.11)
RDW: 13.3 % (ref 11.5–15.5)
WBC: 7.3 10*3/uL (ref 4.0–10.5)

## 2016-07-29 LAB — URINALYSIS, ROUTINE W REFLEX MICROSCOPIC
Bilirubin Urine: NEGATIVE
GLUCOSE, UA: NEGATIVE mg/dL
HGB URINE DIPSTICK: NEGATIVE
KETONES UR: NEGATIVE mg/dL
Leukocytes, UA: NEGATIVE
Nitrite: NEGATIVE
PROTEIN: NEGATIVE mg/dL
Specific Gravity, Urine: 1.008 (ref 1.005–1.030)
pH: 6 (ref 5.0–8.0)

## 2016-07-29 LAB — COMPREHENSIVE METABOLIC PANEL
ALT: 25 U/L (ref 14–54)
AST: 29 U/L (ref 15–41)
Albumin: 3.7 g/dL (ref 3.5–5.0)
Alkaline Phosphatase: 52 U/L (ref 38–126)
Anion gap: 6 (ref 5–15)
BILIRUBIN TOTAL: 0.3 mg/dL (ref 0.3–1.2)
BUN: 18 mg/dL (ref 6–20)
CHLORIDE: 100 mmol/L — AB (ref 101–111)
CO2: 30 mmol/L (ref 22–32)
CREATININE: 0.81 mg/dL (ref 0.44–1.00)
Calcium: 8.9 mg/dL (ref 8.9–10.3)
GFR calc Af Amer: >60 mL/min (ref >60)
GLUCOSE: 109 mg/dL — AB (ref 65–99)
Potassium: 3.7 mmol/L (ref 3.5–5.1)
Sodium: 136 mmol/L (ref 135–145)
Total Protein: 6.3 g/dL — ABNORMAL LOW (ref 6.5–8.1)

## 2016-07-29 MED ORDER — ONDANSETRON HCL 4 MG PO TABS: 4.0000 mg | ORAL_TABLET | Freq: Four times a day (QID) | ORAL | 0 refills | Status: DC

## 2016-07-29 MED ORDER — METRONIDAZOLE IN NACL 5-0.79 MG/ML-% IV SOLN
500.0000 mg | Freq: Once | INTRAVENOUS | Status: AC
  Administered 2016-07-29: 500 mg via INTRAVENOUS
  Filled 2016-07-29: qty 100

## 2016-07-29 MED ORDER — SULFAMETHOXAZOLE-TRIMETHOPRIM 800-160 MG PO TABS: 1.0000 | ORAL_TABLET | Freq: Two times a day (BID) | ORAL | 0 refills | Status: DC

## 2016-07-29 MED ORDER — DEXTROSE 5 % IV SOLN: 2.0000 g | Freq: Once | INTRAVENOUS | Status: DC

## 2016-07-29 MED ORDER — SODIUM CHLORIDE 0.9 % IV BOLUS (SEPSIS)
500.0000 mL | Freq: Once | INTRAVENOUS | Status: AC
  Administered 2016-07-29: 500 mL via INTRAVENOUS

## 2016-07-29 MED ORDER — CEFTRIAXONE SODIUM 2 G IJ SOLR
2.0000 g | Freq: Once | INTRAMUSCULAR | Status: AC
  Administered 2016-07-29: 2 g via INTRAVENOUS
  Filled 2016-07-29: qty 2

## 2016-07-29 MED ORDER — AMOXICILLIN-POT CLAVULANATE 875-125 MG PO TABS: 1.0000 | ORAL_TABLET | Freq: Three times a day (TID) | ORAL | 0 refills | Status: DC

## 2016-07-29 MED ORDER — METRONIDAZOLE IN NACL 5-0.79 MG/ML-% IV SOLN: 500.0000 mg | Freq: Once | INTRAVENOUS | Status: DC

## 2016-07-29 MED ORDER — IOPAMIDOL (ISOVUE-300) INJECTION 61%
100.0000 mL | Freq: Once | INTRAVENOUS | Status: AC | PRN
  Administered 2016-07-29: 100 mL via INTRAVENOUS

## 2016-07-29 MED ORDER — ONDANSETRON HCL 4 MG/2ML IJ SOLN
4.0000 mg | Freq: Once | INTRAMUSCULAR | Status: AC
  Administered 2016-07-29: 4 mg via INTRAVENOUS

## 2016-07-29 MED ORDER — ONDANSETRON HCL 4 MG/2ML IJ SOLN
INTRAMUSCULAR | Status: AC
  Administered 2016-07-29: 4 mg via INTRAVENOUS
  Filled 2016-07-29: qty 2

## 2016-07-29 MED ORDER — METRONIDAZOLE 500 MG PO TABS: 500.0000 mg | ORAL_TABLET | Freq: Two times a day (BID) | ORAL | 0 refills | Status: DC

## 2016-07-29 NOTE — Discharge Instructions (Signed)
Return to the ED with any concerns including vomiting and not able to keep down liquids or your medications, abdominal pain especially if it localizes to the right lower abdomen, fever or chills, and decreased urine output, decreased level of alertness or lethargy, or any other alarming symptoms.

## 2016-07-29 NOTE — ED Notes (Signed)
No vomiting post PO challenge.

## 2016-07-29 NOTE — ED Triage Notes (Signed)
Per son pt with n/v constipation, fatigue-post op ear drum surgery 2 days ago-pt presents to triage in w/c

## 2016-07-29 NOTE — ED Provider Notes (Signed)
[facility] Provider [facility]   CSN: [id number] Arrival date & time: 07/29/16  1825  By signing my name below, I, Hansel Feinstein, attest that this documentation has been prepared under the direction and in the presence of Alfonzo Beers, MD. Electronically Signed: Hansel Feinstein, ED Scribe. 07/29/16. 8:02 PM.     History   Chief Complaint Chief Complaint  Patient presents with  . Vomiting    HPI Amy Hickman is a 81 y.o. female s/p tympanoplasty 2 days ago who presents to the Emergency Department complaining of intermittent vomiting that began today with associated fatigue, nausea, retching, abdominal cramping, constipation that began 3 days ago. Son of patient reports 3 loose BMs since arrival in the ED, but notes she did not have a BM for 3 days prior to that. Pt has h/o difficulty with anesthesia. She has been mostly tolerating fluids. Pt reports h/o bowel resections, appendectomy, cholecystectomy, cesarean section. Pt denies fever, dysuria, frequency.   The history is provided by the patient and a relative. No language interpreter was used.  Emesis   This is a new problem. The current episode started 2 days ago. The problem has not changed since onset.The emesis has an appearance of stomach contents. There has been no fever. Associated symptoms include abdominal pain. Pertinent negatives include no fever.    Past Medical History:  Diagnosis Date  . GERD (gastroesophageal reflux disease)   . HOH (hard of hearing)   . Hypertension   . Interstitial cystitis     There are no active problems to display for this patient.   Past Surgical History:  Procedure Laterality Date  . ABDOMINAL HYSTERECTOMY    . APPENDECTOMY    . CHOLECYSTECTOMY    . ear drum surgery    . REPLACEMENT TOTAL KNEE    . ROTATOR CUFF REPAIR Right   . TONSILLECTOMY      OB History    No data available       Home Medications    Prior to Admission medications   Medication Sig Start Date End  Date Taking? Authorizing Provider  cephALEXin (KEFLEX) 500 MG capsule Take 500 mg by mouth 4 (four) times daily.   Yes Historical Provider, MD  ibuprofen (ADVIL,MOTRIN) 400 MG tablet Take 400 mg by mouth every 6 (six) hours as needed.   Yes Historical Provider, MD  acetaminophen (TYLENOL) 500 MG tablet Take 1 tablet (500 mg total) by mouth every 6 (six) hours as needed for pain. 06/24/12   [name], MD  amLODipine-benazepril (LOTREL) 5-10 MG per capsule Take 1 capsule by mouth daily.    Historical Provider, MD  amoxicillin-clavulanate (AUGMENTIN) 875-125 MG tablet Take 1 tablet by mouth every 8 (eight) hours. 07/29/16   Alfonzo Beers, MD  benzonatate (TESSALON PERLES) 100 MG capsule Take 1 capsule (100 mg total) by mouth 3 (three) times daily as needed for cough. 11/18/13   [name], MD  chlordiazePOXIDE (LIBRIUM) 25 MG capsule Take 25 mg by mouth 3 (three) times daily as needed.    Historical Provider, MD  esomeprazole (NEXIUM) 40 MG capsule Take 40 mg by mouth daily before breakfast.    Historical Provider, MD  furosemide (LASIX) 40 MG tablet Take 40 mg by mouth.    Historical Provider, MD  Lansoprazole (PREVACID PO) Take by mouth.    Historical Provider, MD  levothyroxine (SYNTHROID, LEVOTHROID) 100 MCG tablet Take 75 mcg by mouth daily.     Historical Provider, MD  losartan (COZAAR) 100 MG  tablet Take 100 mg by mouth daily.    Historical Provider, MD  metoprolol (LOPRESSOR) 100 MG tablet Take 75 mg by mouth daily.    Historical Provider, MD  mometasone (NASONEX) 50 MCG/ACT nasal spray Place 2 sprays into the nose daily.    Historical Provider, MD  ondansetron (ZOFRAN) 4 MG tablet Take 1 tablet (4 mg total) by mouth every 6 (six) hours. 07/29/16   Alfonzo Beers, MD  pentosan polysulfate (ELMIRON) 100 MG capsule Take 100 mg by mouth 3 (three) times daily before meals.    Historical Provider, MD  pravastatin (PRAVACHOL) 20 MG tablet Take 20 mg by mouth daily.    Historical Provider, MD    risedronate (ACTONEL) 150 MG tablet Take 150 mg by mouth every 30 (thirty) days. with water on empty stomach, nothing by mouth or lie down for next 30 minutes.    Historical Provider, MD  sodium chloride 1 G tablet Take 1 g by mouth 3 (three) times daily.    Historical Provider, MD  Travoprost, BAK Free, (TRAVATAN) 0.004 % SOLN ophthalmic solution 1 drop at bedtime.    Historical Provider, MD    Family History No family history on file.  Social History Social History  Substance Use Topics  . Smoking status: Never Smoker  . Smokeless tobacco: Never Used  . Alcohol use No     Allergies   Ativan [lorazepam]; Ciprofloxacin hcl; Codeine; Darvon [propoxyphene hcl]; Doxycycline; Gluten meal; Nitrofuran derivatives; and Phenergan [promethazine hcl]   Review of Systems Review of Systems  Constitutional: Positive for fatigue. Negative for fever.  Gastrointestinal: Positive for abdominal pain, constipation, nausea and vomiting.  Genitourinary: Negative for dysuria and frequency.  All other systems reviewed and are negative.    Physical Exam Updated Vital Signs BP 135/78 (BP Location: Left Arm)   Pulse 74   Temp 98 F (36.7 C) (Oral)   Resp 20   Ht 5' 1"  (1.549 m)   Wt 148 lb (67.1 kg)   SpO2 96%   BMI 27.96 kg/m  Vitals reviewed Physical Exam Physical Examination: General appearance - alert, well appearing, and in no distress Mental status - alert, oriented to person, place, and time Eyes -no conjunctival injection, no scleral icterus Chest - clear to auscultation, no wheezes, rales or rhonchi, symmetric air entry Heart - normal rate, regular rhythm, normal S1, S2, no murmurs, rubs, clicks or gallops Abdomen - soft, ttp in periumbilical region, no gaurding or rebound, nabs,, nondistended, no masses or organomegaly Neurological - alert, oriented, normal speech Extremities - peripheral pulses normal, no pedal edema, no clubbing or cyanosis Skin - normal coloration and turgor,  no rashes  ED Treatments / Results   DIAGNOSTIC STUDIES: Oxygen Saturation is 95% on RA, adequate by my interpretation.    COORDINATION OF CARE: 8:01 PM Discussed treatment plan with pt at bedside which includes UA, CT A/P and pt agreed to plan.    Labs (all labs ordered are listed, but only abnormal results are displayed) Labs Reviewed  CBC WITH DIFFERENTIAL/PLATELET - Abnormal; Notable for the following:       Result Value   RBC 3.73 (*)    Platelets 142 (*)    All other components within normal limits  COMPREHENSIVE METABOLIC PANEL - Abnormal; Notable for the following:    Chloride 100 (*)    Glucose, Bld 109 (*)    Total Protein 6.3 (*)    All other components within normal limits  URINALYSIS, ROUTINE W REFLEX MICROSCOPIC  LIPASE, BLOOD    EKG  EKG Interpretation None       Radiology Ct Abdomen Pelvis W Contrast  Result Date: 07/29/2016 CLINICAL DATA:  100cc's Isovue 300 Tympanoplasty 2 days ago, now has vomiting, fever, nausea, abdominal cramping, constipation since. h/o bowel resections, appendectomy, cholecystectomy, C-section, hysterectomy, HTN, GERD.^134m ISOVUE-300 IOPAMIDOL (ISOVUE-300) INJECTION 61% EXAM: CT ABDOMEN AND PELVIS WITH CONTRAST TECHNIQUE: Multidetector CT imaging of the abdomen and pelvis was performed using the standard protocol following bolus administration of intravenous contrast. CONTRAST:  1093mISOVUE-300 IOPAMIDOL (ISOVUE-300) INJECTION 61% COMPARISON:  05/01/2016 FINDINGS: Lower chest: Lung bases are clear. Hepatobiliary: No focal hepatic lesion. Postcholecystectomy. No biliary dilatation. Pancreas: Pancreas is normal. No ductal dilatation. No pancreatic inflammation. Spleen:  Normal spleen Adrenals/urinary tract: Adrenal glands kidneys are normal. No obstructive uropathy. Tiny loculated gas in the bladder. Bladder is a mildly distended. Stomach/Bowel: Stomach is normal. There is a proximal bowel anastomosis in the RIGHT upper quadrant. No  evidence of obstruction. The more distal small bowel is normal. No bowel dilatation or inflammation. Cecum is normal. Appendix not identified. The ascending colon is normal. There is mild bowel wall thickening of the distal transverse colon and descending colon (image 37, series 2). No diverticulosis through this region. Rectum normal. Vascular/Lymphatic: Abdominal aorta is normal caliber with atherosclerotic calcification. There is no retroperitoneal or periportal lymphadenopathy. No pelvic lymphadenopathy. Infrarenal IVC filter noted. Reproductive: Post hysterectomy Other: No free fluid. Musculoskeletal: No aggressive osseous lesion. IMPRESSION: 1. No evidence of bowel obstruction. 2. Mild inflammation along the descending colon suggests mild colitis. No evidence of diverticulitis. 3. Tiny locule of gas within bladder.  Query instrumentation. 4. Surgical anastomosis in proximal small bowel without evidence complication. 5. IVC filter in place. 6.  Atherosclerotic calcification of the aorta (ICD10-170.0). Electronically Signed   By: StSuzy Bouchard.D.   On: 07/29/2016 21:36    Procedures Procedures (including critical care time)  Medications Ordered in ED Medications  sodium chloride 0.9 % bolus 500 mL (0 mLs Intravenous Stopped 07/29/16 2100)  ondansetron (ZOFRAN) injection 4 mg (4 mg Intravenous Given 07/29/16 2013)  iopamidol (ISOVUE-300) 61 % injection 100 mL (100 mLs Intravenous Contrast Given 07/29/16 2103)  metroNIDAZOLE (FLAGYL) IVPB 500 mg (0 mg Intravenous Stopped 07/30/16 0019)  cefTRIAXone (ROCEPHIN) 2 g in dextrose 5 % 50 mL IVPB (0 g Intravenous Stopped 07/29/16 2313)     Initial Impression / Assessment and Plan / ED Course  I have reviewed the triage vital signs and the nursing notes.  Pertinent labs & imaging results that were available during my care of the patient were reviewed by me and considered in my medical decision making (see chart for details).    11:14 PM pt is  tolerating po trial well.  Updated patient that we are planning IV abx, then she will be discharged after first dose of abx is in to complete course of po abx at home.  She is feeling much improved after IV fluids and nausea meds.    Pt given augmentin for home treatment due to cipro allergy   Pt presenting with c/o abdominal pain as well as vomiting.  Pt with reassuring labs, abdominal CT shows colitis.  No further vomiting after zofran.  Pt started on IV abx in the ED.  Discharged on augementin due to cipro allergy.  Discharged with strict return precautions.  Pt agreeable with plan.  Final Clinical Impressions(s) / ED Diagnoses   Final diagnoses:  Colitis    New Prescriptions  Discharge Medication List as of 07/29/2016 11:49 PM    START taking these medications   Details  metroNIDAZOLE (FLAGYL) 500 MG tablet Take 1 tablet (500 mg total) by mouth 2 (two) times daily., Starting Wed 07/29/2016, Print    sulfamethoxazole-trimethoprim (BACTRIM DS,SEPTRA DS) 800-160 MG tablet Take 1 tablet by mouth 2 (two) times daily., Starting Wed 07/29/2016, Until Wed 08/05/2016, Print        I personally performed the services described in this documentation, which was scribed in my presence. The recorded information has been reviewed and is accurate.     Alfonzo Beers, MD 07/30/16 [phone]

## 2016-07-29 NOTE — ED Notes (Signed)
ED Provider at bedside. 

## 2016-08-06 DIAGNOSIS — Z8679 Personal history of other diseases of the circulatory system: Secondary | ICD-10-CM | POA: Insufficient documentation

## 2019-01-03 DIAGNOSIS — F32A Depression, unspecified: Secondary | ICD-10-CM | POA: Insufficient documentation

## 2019-01-22 ENCOUNTER — Emergency Department (HOSPITAL_BASED_OUTPATIENT_CLINIC_OR_DEPARTMENT_OTHER)
Admission: EM | Admit: 2019-01-22 | Discharge: 2019-01-22 | Disposition: A | Discharge status: Home / Self Care | Payer: Medicare Other | Attending: Emergency Medicine | Admitting: Emergency Medicine

## 2019-01-22 ENCOUNTER — Emergency Department (HOSPITAL_BASED_OUTPATIENT_CLINIC_OR_DEPARTMENT_OTHER): Payer: Medicare Other

## 2019-01-22 ENCOUNTER — Encounter (HOSPITAL_BASED_OUTPATIENT_CLINIC_OR_DEPARTMENT_OTHER): Payer: Self-pay | Admitting: *Deleted

## 2019-01-22 ENCOUNTER — Other Ambulatory Visit: Payer: Self-pay

## 2019-01-22 DIAGNOSIS — R112 Nausea with vomiting, unspecified: Secondary | ICD-10-CM | POA: Insufficient documentation

## 2019-01-22 DIAGNOSIS — R110 Nausea: Secondary | ICD-10-CM

## 2019-01-22 DIAGNOSIS — R197 Diarrhea, unspecified: Secondary | ICD-10-CM | POA: Insufficient documentation

## 2019-01-22 DIAGNOSIS — R1010 Upper abdominal pain, unspecified: Secondary | ICD-10-CM | POA: Diagnosis present

## 2019-01-22 DIAGNOSIS — Z79899 Other long term (current) drug therapy: Secondary | ICD-10-CM | POA: Diagnosis not present

## 2019-01-22 DIAGNOSIS — Z96659 Presence of unspecified artificial knee joint: Secondary | ICD-10-CM | POA: Diagnosis not present

## 2019-01-22 DIAGNOSIS — I10 Essential (primary) hypertension: Secondary | ICD-10-CM | POA: Diagnosis not present

## 2019-01-22 HISTORY — DX: Celiac disease: K90.0

## 2019-01-22 LAB — COMPREHENSIVE METABOLIC PANEL
ALT: 15 U/L (ref 0–44)
AST: 27 U/L (ref 15–41)
Albumin: 4.3 g/dL (ref 3.5–5.0)
Alkaline Phosphatase: 59 U/L (ref 38–126)
Anion gap: 8 (ref 5–15)
BUN: 45 mg/dL — ABNORMAL HIGH (ref 8–23)
CO2: 26 mmol/L (ref 22–32)
Calcium: 9.7 mg/dL (ref 8.9–10.3)
Chloride: 103 mmol/L (ref 98–111)
Creatinine, Ser: 1.11 mg/dL — ABNORMAL HIGH (ref 0.44–1.00)
GFR calc Af Amer: 54 mL/min — ABNORMAL LOW (ref >60)
GFR calc non Af Amer: 47 mL/min — ABNORMAL LOW (ref >60)
Glucose, Bld: 82 mg/dL (ref 70–99)
Potassium: 4.3 mmol/L (ref 3.5–5.1)
Sodium: 137 mmol/L (ref 135–145)
Total Bilirubin: 0.5 mg/dL (ref 0.3–1.2)
Total Protein: 7.2 g/dL (ref 6.5–8.1)

## 2019-01-22 LAB — CBC WITH DIFFERENTIAL/PLATELET
Abs Immature Granulocytes: 0.01 10*3/uL (ref 0.00–0.07)
Basophils Absolute: 0 10*3/uL (ref 0.0–0.1)
Basophils Relative: 0 %
Eosinophils Absolute: 0.3 10*3/uL (ref 0.0–0.5)
Eosinophils Relative: 6 %
HCT: 37.8 % (ref 36.0–46.0)
Hemoglobin: 11.8 g/dL — ABNORMAL LOW (ref 12.0–15.0)
Immature Granulocytes: 0 %
Lymphocytes Relative: 21 %
Lymphs Abs: 1.2 10*3/uL (ref 0.7–4.0)
MCH: 29.2 pg (ref 26.0–34.0)
MCHC: 31.2 g/dL (ref 30.0–36.0)
MCV: 93.6 fL (ref 80.0–100.0)
Monocytes Absolute: 0.5 10*3/uL (ref 0.1–1.0)
Monocytes Relative: 9 %
Neutro Abs: 3.5 10*3/uL (ref 1.7–7.7)
Neutrophils Relative %: 64 %
Platelets: 202 10*3/uL (ref 150–400)
RBC: 4.04 MIL/uL (ref 3.87–5.11)
RDW: 14.1 % (ref 11.5–15.5)
WBC: 5.5 10*3/uL (ref 4.0–10.5)
nRBC: 0 % (ref 0.0–0.2)

## 2019-01-22 LAB — URINALYSIS, ROUTINE W REFLEX MICROSCOPIC
Bilirubin Urine: NEGATIVE
Glucose, UA: NEGATIVE mg/dL
Hgb urine dipstick: NEGATIVE
Ketones, ur: NEGATIVE mg/dL
Leukocytes,Ua: NEGATIVE
Nitrite: NEGATIVE
Protein, ur: NEGATIVE mg/dL
Specific Gravity, Urine: 1.01 (ref 1.005–1.030)
pH: 6 (ref 5.0–8.0)

## 2019-01-22 LAB — LIPASE, BLOOD: Lipase: 35 U/L (ref 11–51)

## 2019-01-22 MED ORDER — IOHEXOL 300 MG/ML  SOLN
100.0000 mL | Freq: Once | INTRAMUSCULAR | Status: AC | PRN
  Administered 2019-01-22: 15:00:00 80 mL via INTRAVENOUS

## 2019-01-22 MED ORDER — SODIUM CHLORIDE 0.9 % IV BOLUS
500.0000 mL | Freq: Once | INTRAVENOUS | Status: AC
  Administered 2019-01-22: 14:00:00 500 mL via INTRAVENOUS

## 2019-01-22 MED ORDER — ONDANSETRON 4 MG PO TBDP: 4.0000 mg | ORAL_TABLET | Freq: Three times a day (TID) | ORAL | 0 refills | Status: DC | PRN

## 2019-01-22 MED ORDER — ONDANSETRON HCL 4 MG/2ML IJ SOLN
4.0000 mg | Freq: Once | INTRAMUSCULAR | Status: AC
  Administered 2019-01-22: 4 mg via INTRAVENOUS
  Filled 2019-01-22: qty 2

## 2019-01-22 NOTE — ED Notes (Signed)
Patient ambulated to restroom; steady gait observed.

## 2019-01-22 NOTE — Discharge Instructions (Signed)
It was my pleasure taking care of you today!   Stay hydrated. Drink small sips of water frequently throughout the day.   Zofran as needed for nausea/vomiting.  Call your primary care doctor or your GI doctor in the morning to schedule a follow up appointment to discuss your symptoms.   Return to ER for fever, vomiting not controlled with nausea medication, new or worsening symptoms, any additional concerns.

## 2019-01-22 NOTE — ED Notes (Signed)
Patient transported to CT 

## 2019-01-22 NOTE — ED Notes (Signed)
ED Provider at bedside. 

## 2019-01-22 NOTE — ED Notes (Signed)
Apple juice provided

## 2019-01-22 NOTE — ED Provider Notes (Signed)
[facility]ENC[facility] DEPARTMENT Provider Note   CSN: [id number] Arrival date & time: 01/22/19  1314     History   Chief Complaint Chief Complaint  Patient presents with  . Abdominal Pain    HPI Amy Hickman is a 83 y.o. female.     The history is provided by the patient and medical records. No language interpreter was used.  Abdominal Pain Associated symptoms: diarrhea, nausea and vomiting    Amy Hickman is a 83 y.o. female  with a PMH as listed below who presents to the Emergency Department complaining of nausea, vomiting and diarrhea.  Patient states that she was in her usual state of health last night when she went to bed, but awoke this morning around 2 or 3 AM to use the restroom.  When she went, she noticed that her upper abdomen felt uncomfortable.  She urinated and then went back to bed.  A couple minutes later, her stomach became more uncomfortable and she went to the bathroom again, having an episode of diarrhea.  She has had several episodes of diarrhea since then as well as 3 episodes of vomiting.  Her granddaughter took her temperature which was normal.  She continued to be symptomatic and was unable to keep her breakfast down, therefore her granddaughter called her primary care doctor who recommended that she come to the ER for evaluation.  Denies recent antibiotic use.  No known sick contacts.  Past Medical History:  Diagnosis Date  . Celiac disease   . GERD (gastroesophageal reflux disease)   . HOH (hard of hearing)   . Hypertension   . Interstitial cystitis     There are no active problems to display for this patient.   Past Surgical History:  Procedure Laterality Date  . ABDOMINAL HYSTERECTOMY    . APPENDECTOMY    . CHOLECYSTECTOMY    . ear drum surgery    . REPLACEMENT TOTAL KNEE    . ROTATOR CUFF REPAIR Right   . TONSILLECTOMY       OB History   No obstetric history on file.      Home Medications    Prior to Admission  medications   Medication Sig Start Date End Date Taking? Authorizing Provider  diltiazem (TIAZAC) 120 MG 24 hr capsule Take 120 mg by mouth daily.   Yes [provider]  hyoscyamine (ANASPAZ) 0.125 MG TBDP disintergrating tablet Place 0.125 mg under the tongue.   Yes [provider]  acetaminophen (TYLENOL) 500 MG tablet Take 1 tablet (500 mg total) by mouth every 6 (six) hours as needed for pain. 06/24/12   [name], MD  amLODipine-benazepril (LOTREL) 5-10 MG per capsule Take 1 capsule by mouth daily.    [provider]  amoxicillin-clavulanate (AUGMENTIN) 875-125 MG tablet Take 1 tablet by mouth every 8 (eight) hours. 07/29/16   Mabe, Forbes Cellar, MD  benzonatate (TESSALON PERLES) 100 MG capsule Take 1 capsule (100 mg total) by mouth 3 (three) times daily as needed for cough. 11/18/13   [name], MD  cephALEXin (KEFLEX) 500 MG capsule Take 500 mg by mouth 4 (four) times daily.    [provider]  chlordiazePOXIDE (LIBRIUM) 25 MG capsule Take 25 mg by mouth 3 (three) times daily as needed.    [provider]  esomeprazole (NEXIUM) 40 MG capsule Take 40 mg by mouth daily before breakfast.    [provider]  furosemide (LASIX) 40 MG tablet Take 40 mg by  mouth.    [provider]  ibuprofen (ADVIL,MOTRIN) 400 MG tablet Take 400 mg by mouth every 6 (six) hours as needed.    [provider]  Lansoprazole (PREVACID PO) Take by mouth.    [provider]  levothyroxine (SYNTHROID, LEVOTHROID) 100 MCG tablet Take 75 mcg by mouth daily.     [provider]  losartan (COZAAR) 100 MG tablet Take 100 mg by mouth daily.    [provider]  metoprolol (LOPRESSOR) 100 MG tablet Take 75 mg by mouth daily.    [provider]  mometasone (NASONEX) 50 MCG/ACT nasal spray Place 2 sprays into the nose daily.    [provider]  ondansetron (ZOFRAN ODT) 4 MG disintegrating tablet Take 1 tablet (4  mg total) by mouth every 8 (eight) hours as needed for nausea or vomiting. 01/22/19   Madiline Saffran, Ozella Almond, PA-C  ondansetron (ZOFRAN) 4 MG tablet Take 1 tablet (4 mg total) by mouth every 6 (six) hours. 07/29/16   Mabe, Forbes Cellar, MD  pentosan polysulfate (ELMIRON) 100 MG capsule Take 100 mg by mouth 3 (three) times daily before meals.    [provider]  pravastatin (PRAVACHOL) 20 MG tablet Take 20 mg by mouth daily.    [provider]  risedronate (ACTONEL) 150 MG tablet Take 150 mg by mouth every 30 (thirty) days. with water on empty stomach, nothing by mouth or lie down for next 30 minutes.    [provider]  sodium chloride 1 G tablet Take 1 g by mouth 3 (three) times daily.    [provider]  Travoprost, BAK Free, (TRAVATAN) 0.004 % SOLN ophthalmic solution 1 drop at bedtime.    [provider]    Family History No family history on file.  Social History Social History   Tobacco Use  . Smoking status: Never Smoker  . Smokeless tobacco: Never Used  Substance Use Topics  . Alcohol use: Yes    Comment: rare  . Drug use: No     Allergies   Ativan [lorazepam], Ciprofloxacin hcl, Codeine, Darvon [propoxyphene hcl], Doxycycline, Gluten meal, Nitrofuran derivatives, and Phenergan [promethazine hcl]   Review of Systems Review of Systems  Gastrointestinal: Positive for abdominal pain, diarrhea, nausea and vomiting.  All other systems reviewed and are negative.    Physical Exam Updated Vital Signs BP (!) 146/70 (BP Location: Right Arm)   Pulse 64   Temp 97.9 F (36.6 C) (Oral)   Resp 16   Ht 5' 1"  (1.549 m)   Wt 67.6 kg   SpO2 99%   BMI 28.15 kg/m   Physical Exam Vitals signs and nursing note reviewed.  Constitutional:      General: She is not in acute distress.    Appearance: She is well-developed.  HENT:     Head: Normocephalic and atraumatic.  Neck:     Musculoskeletal: Neck supple.  Cardiovascular:     Rate and  Rhythm: Normal rate and regular rhythm.     Heart sounds: Normal heart sounds. No murmur.  Pulmonary:     Effort: Pulmonary effort is normal. No respiratory distress.     Breath sounds: Normal breath sounds.  Abdominal:     General: There is no distension.     Palpations: Abdomen is soft.     Comments: Generalized abdominal tenderness without rebound or guarding.  No flank or CVA tenderness.  Skin:    General: Skin is warm and dry.  Neurological:  Mental Status: She is alert and oriented to person, place, and time.      ED Treatments / Results  Labs (all labs ordered are listed, but only abnormal results are displayed) Labs Reviewed  CBC WITH DIFFERENTIAL/PLATELET - Abnormal; Notable for the following components:      Result Value   Hemoglobin 11.8 (*)    All other components within normal limits  COMPREHENSIVE METABOLIC PANEL - Abnormal; Notable for the following components:   BUN 45 (*)    Creatinine, Ser 1.11 (*)    GFR calc non Af Amer 47 (*)    GFR calc Af Amer 54 (*)    All other components within normal limits  URINE CULTURE  LIPASE, BLOOD  URINALYSIS, ROUTINE W REFLEX MICROSCOPIC    EKG None  Radiology Ct Abdomen Pelvis W Contrast  Result Date: 01/22/2019 CLINICAL DATA:  Abdominal pain with nausea, vomiting and diarrhea. History of celiac disease. EXAM: CT ABDOMEN AND PELVIS WITH CONTRAST TECHNIQUE: Multidetector CT imaging of the abdomen and pelvis was performed using the standard protocol following bolus administration of intravenous contrast. CONTRAST:  83m OMNIPAQUE IOHEXOL 300 MG/ML  SOLN COMPARISON:  07/29/2016 FINDINGS: Lower chest: Lung bases are within normal. Calcified plaque over the right coronary artery as well as the descending thoracic aorta. Hepatobiliary: Previous cholecystectomy. Liver and biliary tree are normal. Pancreas: Normal. Spleen: Normal. Adrenals/Urinary Tract: Adrenal glands are normal. Kidneys are normal in size without  hydronephrosis or nephrolithiasis. Ureters and bladder are normal. Stomach/Bowel: Stomach and small bowel are within normal. Several surgical clips in the region of the gastroesophageal junction. Colon is decompressed from the mid transverse colon distally. Previous appendectomy. Surgical suture line over the anterior left lower quadrant adjacent a few small bowel loops. Vascular/Lymphatic: Moderate calcified plaque over the abdominal aorta. Calcified plaque at the origin of the renal arteries. IVC filter present just below the renal veins. No adenopathy. Reproductive: Previous hysterectomy. Other: No free fluid or focal inflammatory change. Musculoskeletal: Degenerative change of the spine and hips. Multilevel disc disease over the lumbar spine. IMPRESSION: No acute findings in the abdomen/pelvis. Aortic Atherosclerosis (ICD10-I70.0). Atherosclerotic coronary artery disease. Multiple postsurgical changes as described. Electronically Signed   By: DMarin OlpM.D.   On: 01/22/2019 15:58    Procedures Procedures (including critical care time)  Medications Ordered in ED Medications  ondansetron (ZOFRAN) injection 4 mg (4 mg Intravenous Given 01/22/19 1427)  sodium chloride 0.9 % bolus 500 mL (0 mLs Intravenous Stopped 01/22/19 1559)  iohexol (OMNIPAQUE) 300 MG/ML solution 100 mL (80 mLs Intravenous Contrast Given 01/22/19 1505)     Initial Impression / Assessment and Plan / ED Course  I have reviewed the triage vital signs and the nursing notes.  Pertinent labs & imaging results that were available during my care of the patient were reviewed by me and considered in my medical decision making (see chart for details).       Amy OSTERHOUTis a 83y.o. female who presents to ED for generalized abdominal pain, n/v/d which began this morning at 2am. On exam, afebrile, hemodynamically stable with generalized abdominal tenderness. No rebound or guarding. Labs reviewed and overall reassuring. Does have  AKI with creatinine of 1.11. Given fluids. No acute findings on CT. Feels improved on repeat exam. Tolerating PO. Evaluation does not show pathology that would require ongoing emergent intervention or inpatient treatment. Will have her follow up with GI or PCP - encouraged her to call tomorrow am. Return precautions were  discussed, hydration stressed, all questions answered.   Patient seen by and discussed with Dr. Gilford Raid who agrees with treatment plan.    Final Clinical Impressions(s) / ED Diagnoses   Final diagnoses:  Diarrhea, unspecified type  Nausea    ED Discharge Orders         Ordered    ondansetron (ZOFRAN ODT) 4 MG disintegrating tablet  Every 8 hours PRN     01/22/19 1649           Cyncere Sontag, Ozella Almond, PA-C 01/22/19 1654    Isla Pence, MD 01/22/19 1800

## 2019-01-22 NOTE — ED Triage Notes (Signed)
Pt reports diarrhea this morning x 2 hours. Later she vomited x 5 after eating. Hx of celiac and ?if this is related

## 2019-01-23 LAB — URINE CULTURE: Culture: <10000 — AB

## 2020-12-12 DIAGNOSIS — E213 Hyperparathyroidism, unspecified: Secondary | ICD-10-CM | POA: Insufficient documentation

## 2020-12-12 HISTORY — DX: Hyperparathyroidism, unspecified: E21.3

## 2021-03-15 ENCOUNTER — Other Ambulatory Visit: Payer: Self-pay

## 2021-03-15 ENCOUNTER — Emergency Department (HOSPITAL_BASED_OUTPATIENT_CLINIC_OR_DEPARTMENT_OTHER): Payer: Medicare Other

## 2021-03-15 ENCOUNTER — Encounter (HOSPITAL_BASED_OUTPATIENT_CLINIC_OR_DEPARTMENT_OTHER): Payer: Self-pay | Admitting: *Deleted

## 2021-03-15 ENCOUNTER — Inpatient Hospital Stay (HOSPITAL_BASED_OUTPATIENT_CLINIC_OR_DEPARTMENT_OTHER)
Admission: EM | Admit: 2021-03-15 | Discharge: 2021-03-21 | DRG: 872 | Disposition: A | Discharge status: Home Health | Payer: Medicare Other | Attending: Internal Medicine | Admitting: Internal Medicine

## 2021-03-15 DIAGNOSIS — Z888 Allergy status to other drugs, medicaments and biological substances status: Secondary | ICD-10-CM

## 2021-03-15 DIAGNOSIS — A419 Sepsis, unspecified organism: Secondary | ICD-10-CM | POA: Diagnosis not present

## 2021-03-15 DIAGNOSIS — Z20822 Contact with and (suspected) exposure to covid-19: Secondary | ICD-10-CM | POA: Diagnosis present

## 2021-03-15 DIAGNOSIS — Z880 Allergy status to penicillin: Secondary | ICD-10-CM

## 2021-03-15 DIAGNOSIS — I2510 Atherosclerotic heart disease of native coronary artery without angina pectoris: Secondary | ICD-10-CM

## 2021-03-15 DIAGNOSIS — I350 Nonrheumatic aortic (valve) stenosis: Secondary | ICD-10-CM | POA: Diagnosis present

## 2021-03-15 DIAGNOSIS — S92901D Unspecified fracture of right foot, subsequent encounter for fracture with routine healing: Secondary | ICD-10-CM

## 2021-03-15 DIAGNOSIS — D72810 Lymphocytopenia: Secondary | ICD-10-CM | POA: Diagnosis present

## 2021-03-15 DIAGNOSIS — A4189 Other specified sepsis: Secondary | ICD-10-CM | POA: Diagnosis present

## 2021-03-15 DIAGNOSIS — D500 Iron deficiency anemia secondary to blood loss (chronic): Secondary | ICD-10-CM

## 2021-03-15 DIAGNOSIS — I471 Supraventricular tachycardia: Secondary | ICD-10-CM | POA: Diagnosis present

## 2021-03-15 DIAGNOSIS — I5022 Chronic systolic (congestive) heart failure: Secondary | ICD-10-CM | POA: Diagnosis present

## 2021-03-15 DIAGNOSIS — D72819 Decreased white blood cell count, unspecified: Secondary | ICD-10-CM | POA: Diagnosis present

## 2021-03-15 DIAGNOSIS — E222 Syndrome of inappropriate secretion of antidiuretic hormone: Secondary | ICD-10-CM | POA: Diagnosis present

## 2021-03-15 DIAGNOSIS — I4819 Other persistent atrial fibrillation: Secondary | ICD-10-CM | POA: Diagnosis present

## 2021-03-15 DIAGNOSIS — I4891 Unspecified atrial fibrillation: Secondary | ICD-10-CM

## 2021-03-15 DIAGNOSIS — Z86718 Personal history of other venous thrombosis and embolism: Secondary | ICD-10-CM | POA: Diagnosis not present

## 2021-03-15 DIAGNOSIS — K900 Celiac disease: Secondary | ICD-10-CM | POA: Diagnosis present

## 2021-03-15 DIAGNOSIS — I428 Other cardiomyopathies: Secondary | ICD-10-CM | POA: Diagnosis present

## 2021-03-15 DIAGNOSIS — Z881 Allergy status to other antibiotic agents status: Secondary | ICD-10-CM

## 2021-03-15 DIAGNOSIS — J101 Influenza due to other identified influenza virus with other respiratory manifestations: Secondary | ICD-10-CM | POA: Diagnosis present

## 2021-03-15 DIAGNOSIS — E039 Hypothyroidism, unspecified: Secondary | ICD-10-CM | POA: Diagnosis present

## 2021-03-15 DIAGNOSIS — Z86711 Personal history of pulmonary embolism: Secondary | ICD-10-CM

## 2021-03-15 DIAGNOSIS — Z8249 Family history of ischemic heart disease and other diseases of the circulatory system: Secondary | ICD-10-CM

## 2021-03-15 DIAGNOSIS — E871 Hypo-osmolality and hyponatremia: Secondary | ICD-10-CM

## 2021-03-15 DIAGNOSIS — D649 Anemia, unspecified: Secondary | ICD-10-CM | POA: Diagnosis present

## 2021-03-15 DIAGNOSIS — N184 Chronic kidney disease, stage 4 (severe): Secondary | ICD-10-CM | POA: Diagnosis present

## 2021-03-15 DIAGNOSIS — R195 Other fecal abnormalities: Secondary | ICD-10-CM | POA: Diagnosis present

## 2021-03-15 DIAGNOSIS — E1122 Type 2 diabetes mellitus with diabetic chronic kidney disease: Secondary | ICD-10-CM | POA: Diagnosis present

## 2021-03-15 DIAGNOSIS — J111 Influenza due to unidentified influenza virus with other respiratory manifestations: Secondary | ICD-10-CM

## 2021-03-15 DIAGNOSIS — K219 Gastro-esophageal reflux disease without esophagitis: Secondary | ICD-10-CM | POA: Diagnosis present

## 2021-03-15 DIAGNOSIS — W19XXXD Unspecified fall, subsequent encounter: Secondary | ICD-10-CM | POA: Diagnosis present

## 2021-03-15 DIAGNOSIS — H9190 Unspecified hearing loss, unspecified ear: Secondary | ICD-10-CM | POA: Diagnosis present

## 2021-03-15 DIAGNOSIS — I130 Hypertensive heart and chronic kidney disease with heart failure and stage 1 through stage 4 chronic kidney disease, or unspecified chronic kidney disease: Secondary | ICD-10-CM | POA: Diagnosis present

## 2021-03-15 DIAGNOSIS — Z885 Allergy status to narcotic agent status: Secondary | ICD-10-CM

## 2021-03-15 DIAGNOSIS — I517 Cardiomegaly: Secondary | ICD-10-CM

## 2021-03-15 DIAGNOSIS — E669 Obesity, unspecified: Secondary | ICD-10-CM | POA: Diagnosis present

## 2021-03-15 DIAGNOSIS — R0602 Shortness of breath: Secondary | ICD-10-CM

## 2021-03-15 DIAGNOSIS — Z87891 Personal history of nicotine dependence: Secondary | ICD-10-CM

## 2021-03-15 DIAGNOSIS — E785 Hyperlipidemia, unspecified: Secondary | ICD-10-CM | POA: Diagnosis present

## 2021-03-15 DIAGNOSIS — J441 Chronic obstructive pulmonary disease with (acute) exacerbation: Secondary | ICD-10-CM | POA: Diagnosis present

## 2021-03-15 DIAGNOSIS — I10 Essential (primary) hypertension: Secondary | ICD-10-CM

## 2021-03-15 DIAGNOSIS — Z79899 Other long term (current) drug therapy: Secondary | ICD-10-CM

## 2021-03-15 DIAGNOSIS — Z91018 Allergy to other foods: Secondary | ICD-10-CM

## 2021-03-15 DIAGNOSIS — R079 Chest pain, unspecified: Secondary | ICD-10-CM

## 2021-03-15 DIAGNOSIS — G4733 Obstructive sleep apnea (adult) (pediatric): Secondary | ICD-10-CM | POA: Diagnosis present

## 2021-03-15 DIAGNOSIS — K921 Melena: Secondary | ICD-10-CM | POA: Diagnosis not present

## 2021-03-15 DIAGNOSIS — R062 Wheezing: Secondary | ICD-10-CM | POA: Diagnosis present

## 2021-03-15 DIAGNOSIS — Z8719 Personal history of other diseases of the digestive system: Secondary | ICD-10-CM | POA: Diagnosis not present

## 2021-03-15 DIAGNOSIS — Z66 Do not resuscitate: Secondary | ICD-10-CM | POA: Diagnosis present

## 2021-03-15 DIAGNOSIS — Z79890 Hormone replacement therapy (postmenopausal): Secondary | ICD-10-CM

## 2021-03-15 DIAGNOSIS — Z90710 Acquired absence of both cervix and uterus: Secondary | ICD-10-CM

## 2021-03-15 DIAGNOSIS — Z6830 Body mass index (BMI) 30.0-30.9, adult: Secondary | ICD-10-CM | POA: Diagnosis not present

## 2021-03-15 HISTORY — DX: Atherosclerotic heart disease of native coronary artery without angina pectoris: I25.10

## 2021-03-15 LAB — COMPREHENSIVE METABOLIC PANEL
ALT: 23 U/L (ref 0–44)
AST: 31 U/L (ref 15–41)
Albumin: 3.8 g/dL (ref 3.5–5.0)
Alkaline Phosphatase: 57 U/L (ref 38–126)
Anion gap: 9 (ref 5–15)
BUN: 15 mg/dL (ref 8–23)
CO2: 22 mmol/L (ref 22–32)
Calcium: 9.4 mg/dL (ref 8.9–10.3)
Chloride: 102 mmol/L (ref 98–111)
Creatinine, Ser: 1.01 mg/dL — ABNORMAL HIGH (ref 0.44–1.00)
GFR, Estimated: 56 mL/min — ABNORMAL LOW (ref >60)
Glucose, Bld: 107 mg/dL — ABNORMAL HIGH (ref 70–99)
Potassium: 4.3 mmol/L (ref 3.5–5.1)
Sodium: 133 mmol/L — ABNORMAL LOW (ref 135–145)
Total Bilirubin: 0.3 mg/dL (ref 0.3–1.2)
Total Protein: 6.4 g/dL — ABNORMAL LOW (ref 6.5–8.1)

## 2021-03-15 LAB — CBC WITH DIFFERENTIAL/PLATELET
Abs Immature Granulocytes: 0.01 10*3/uL (ref 0.00–0.07)
Basophils Absolute: 0 10*3/uL (ref 0.0–0.1)
Basophils Relative: 1 %
Eosinophils Absolute: 0.1 10*3/uL (ref 0.0–0.5)
Eosinophils Relative: 2 %
HCT: 36.8 % (ref 36.0–46.0)
Hemoglobin: 11 g/dL — ABNORMAL LOW (ref 12.0–15.0)
Immature Granulocytes: 0 %
Lymphocytes Relative: 19 %
Lymphs Abs: 0.6 10*3/uL — ABNORMAL LOW (ref 0.7–4.0)
MCH: 26.1 pg (ref 26.0–34.0)
MCHC: 29.9 g/dL — ABNORMAL LOW (ref 30.0–36.0)
MCV: 87.4 fL (ref 80.0–100.0)
Monocytes Absolute: 0.6 10*3/uL (ref 0.1–1.0)
Monocytes Relative: 18 %
Neutro Abs: 2 10*3/uL (ref 1.7–7.7)
Neutrophils Relative %: 60 %
Platelets: 197 10*3/uL (ref 150–400)
RBC: 4.21 MIL/uL (ref 3.87–5.11)
RDW: 17.6 % — ABNORMAL HIGH (ref 11.5–15.5)
WBC: 3.3 10*3/uL — ABNORMAL LOW (ref 4.0–10.5)
nRBC: 0 % (ref 0.0–0.2)

## 2021-03-15 LAB — RETICULOCYTES
Immature Retic Fract: 14.3 % (ref 2.3–15.9)
RBC.: 4.11 MIL/uL (ref 3.87–5.11)
Retic Count, Absolute: 48.1 10*3/uL (ref 19.0–186.0)
Retic Ct Pct: 1.2 % (ref 0.4–3.1)

## 2021-03-15 LAB — RESP PANEL BY RT-PCR (FLU A&B, COVID) ARPGX2
Influenza A by PCR: POSITIVE — AB
Influenza B by PCR: NEGATIVE
SARS Coronavirus 2 by RT PCR: NEGATIVE

## 2021-03-15 LAB — BLOOD GAS, VENOUS
Acid-base deficit: 4.1 mmol/L — ABNORMAL HIGH (ref 0.0–2.0)
Bicarbonate: 21.2 mmol/L (ref 20.0–28.0)
O2 Saturation: 58.4 %
Patient temperature: 98.6
pCO2, Ven: 42.2 mmHg — ABNORMAL LOW (ref 44.0–60.0)
pH, Ven: 7.323 (ref 7.250–7.430)
pO2, Ven: 34 mmHg (ref 32.0–45.0)

## 2021-03-15 MED ORDER — METHYLPREDNISOLONE SODIUM SUCC 40 MG IJ SOLR
40.0000 mg | Freq: Two times a day (BID) | INTRAMUSCULAR | Status: AC
  Administered 2021-03-16 (×2): 40 mg via INTRAVENOUS
  Filled 2021-03-15 (×2): qty 1

## 2021-03-15 MED ORDER — ACETAMINOPHEN 500 MG PO TABS
1000.0000 mg | ORAL_TABLET | Freq: Once | ORAL | Status: AC
  Administered 2021-03-15: 1000 mg via ORAL
  Filled 2021-03-15: qty 2

## 2021-03-15 MED ORDER — METOPROLOL TARTRATE 5 MG/5ML IV SOLN
5.0000 mg | Freq: Once | INTRAVENOUS | Status: AC
  Administered 2021-03-15: 5 mg via INTRAVENOUS
  Filled 2021-03-15: qty 5

## 2021-03-15 MED ORDER — METOPROLOL TARTRATE 5 MG/5ML IV SOLN
2.5000 mg | Freq: Once | INTRAVENOUS | Status: AC
  Administered 2021-03-15: 2.5 mg via INTRAVENOUS
  Filled 2021-03-15: qty 5

## 2021-03-15 MED ORDER — ACETAMINOPHEN 325 MG PO TABS
650.0000 mg | ORAL_TABLET | Freq: Four times a day (QID) | ORAL | Status: DC | PRN
  Administered 2021-03-16 – 2021-03-17 (×2): 650 mg via ORAL
  Filled 2021-03-15 (×2): qty 2

## 2021-03-15 MED ORDER — PRAVASTATIN SODIUM 20 MG PO TABS: 20.0000 mg | ORAL_TABLET | Freq: Every day | ORAL | Status: DC

## 2021-03-15 MED ORDER — PREDNISONE 20 MG PO TABS
40.0000 mg | ORAL_TABLET | Freq: Every day | ORAL | Status: AC
  Administered 2021-03-17 – 2021-03-20 (×4): 40 mg via ORAL
  Filled 2021-03-15 (×4): qty 2

## 2021-03-15 MED ORDER — METHYLPREDNISOLONE SODIUM SUCC 125 MG IJ SOLR
125.0000 mg | Freq: Once | INTRAMUSCULAR | Status: AC
  Administered 2021-03-15: 125 mg via INTRAVENOUS
  Filled 2021-03-15: qty 2

## 2021-03-15 MED ORDER — METOPROLOL TARTRATE 25 MG PO TABS
50.0000 mg | ORAL_TABLET | Freq: Once | ORAL | Status: AC
  Administered 2021-03-15: 50 mg via ORAL
  Filled 2021-03-15: qty 2

## 2021-03-15 MED ORDER — IPRATROPIUM BROMIDE 0.02 % IN SOLN
0.5000 mg | Freq: Once | RESPIRATORY_TRACT | Status: AC
  Administered 2021-03-15: 0.5 mg via RESPIRATORY_TRACT
  Filled 2021-03-15: qty 2.5

## 2021-03-15 MED ORDER — SODIUM CHLORIDE 0.9 % IV SOLN
1.0000 g | INTRAVENOUS | Status: AC
  Administered 2021-03-16 – 2021-03-20 (×5): 1 g via INTRAVENOUS
  Filled 2021-03-15 (×5): qty 10

## 2021-03-15 MED ORDER — ACETAMINOPHEN 650 MG RE SUPP: 650.0000 mg | Freq: Four times a day (QID) | RECTAL | Status: DC | PRN

## 2021-03-15 MED ORDER — LATANOPROST 0.005 % OP SOLN
1.0000 [drp] | Freq: Every day | OPHTHALMIC | Status: DC
  Administered 2021-03-16 – 2021-03-20 (×5): 1 [drp] via OPHTHALMIC
  Filled 2021-03-15: qty 2.5

## 2021-03-15 MED ORDER — ALBUTEROL SULFATE (2.5 MG/3ML) 0.083% IN NEBU
5.0000 mg | INHALATION_SOLUTION | Freq: Once | RESPIRATORY_TRACT | Status: AC
  Administered 2021-03-15: 5 mg via RESPIRATORY_TRACT
  Filled 2021-03-15: qty 6

## 2021-03-15 MED ORDER — DEXTROMETHORPHAN POLISTIREX ER 30 MG/5ML PO SUER
30.0000 mg | Freq: Two times a day (BID) | ORAL | Status: DC
Start: 2021-03-16 — End: 2021-03-21
  Administered 2021-03-16 – 2021-03-21 (×12): 30 mg via ORAL
  Filled 2021-03-15 (×14): qty 5

## 2021-03-15 MED ORDER — BENZONATATE 100 MG PO CAPS
100.0000 mg | ORAL_CAPSULE | Freq: Three times a day (TID) | ORAL | Status: DC | PRN
  Administered 2021-03-16 – 2021-03-20 (×3): 100 mg via ORAL
  Filled 2021-03-15 (×3): qty 1

## 2021-03-15 MED ORDER — ALBUTEROL SULFATE (2.5 MG/3ML) 0.083% IN NEBU: 2.5000 mg | INHALATION_SOLUTION | RESPIRATORY_TRACT | Status: DC | PRN

## 2021-03-15 MED ORDER — IPRATROPIUM-ALBUTEROL 0.5-2.5 (3) MG/3ML IN SOLN
3.0000 mL | Freq: Four times a day (QID) | RESPIRATORY_TRACT | Status: DC
  Administered 2021-03-16 (×4): 3 mL via RESPIRATORY_TRACT
  Filled 2021-03-15 (×4): qty 3

## 2021-03-15 MED ORDER — DILTIAZEM HCL ER COATED BEADS 120 MG PO CP24
120.0000 mg | ORAL_CAPSULE | Freq: Every day | ORAL | Status: DC
  Administered 2021-03-16: 09:00:00 120 mg via ORAL
  Filled 2021-03-15: qty 1

## 2021-03-15 MED ORDER — SODIUM CHLORIDE 0.9 % IV SOLN
75.0000 mL/h | INTRAVENOUS | Status: AC
  Administered 2021-03-16: 01:00:00 75 mL/h via INTRAVENOUS

## 2021-03-15 MED ORDER — LEVOTHYROXINE SODIUM 50 MCG PO TABS
75.0000 ug | ORAL_TABLET | Freq: Every day | ORAL | Status: DC
  Administered 2021-03-16 – 2021-03-21 (×6): 75 ug via ORAL
  Filled 2021-03-15 (×6): qty 1

## 2021-03-15 MED ORDER — METOPROLOL TARTRATE 50 MG PO TABS
50.0000 mg | ORAL_TABLET | Freq: Every day | ORAL | Status: DC
  Administered 2021-03-16 – 2021-03-17 (×2): 50 mg via ORAL
  Filled 2021-03-15 (×2): qty 1

## 2021-03-15 MED ORDER — SODIUM CHLORIDE 1 G PO TABS
1.0000 g | ORAL_TABLET | Freq: Three times a day (TID) | ORAL | Status: DC
  Administered 2021-03-16 – 2021-03-19 (×10): 1 g via ORAL
  Filled 2021-03-15 (×10): qty 1

## 2021-03-15 MED ORDER — OSELTAMIVIR PHOSPHATE 75 MG PO CAPS: 75.0000 mg | ORAL_CAPSULE | Freq: Two times a day (BID) | ORAL | Status: DC

## 2021-03-15 MED ORDER — OSELTAMIVIR PHOSPHATE 30 MG PO CAPS
30.0000 mg | ORAL_CAPSULE | Freq: Two times a day (BID) | ORAL | Status: AC
  Administered 2021-03-16 – 2021-03-20 (×10): 30 mg via ORAL
  Filled 2021-03-15 (×10): qty 1

## 2021-03-15 NOTE — H&P (Signed)
Amy Hickman JJK:[id number] DOB: 1936-02-28 DOA: 03/15/2021     PCP: Malachi Pro Pamalee Leyden, PA-C   Outpatient Specialists:     Pulmonary  Dr. Camillo Flaming   [facility]    Patient arrived to ER on 03/15/21 at 1417 Referred by Attending Orma Flaming, MD   Patient coming from: home Lives alone,       Chief Complaint:   Chief Complaint  Patient presents with   Cough    HPI: Amy Hickman is a 85 y.o. female with medical history significant of COPD celiac, GERD, hypertension recent of lower extremity, OSA not on CPAP, CAD, CKD. IVC filter, aortic valve stenosis, GERD    Presented with cough since Thursday with nausea and feeling generally weak Also endorsing increasing shortness of breath worse since Thanksgiving.  Now with posttussive emesis and dry heaving poor appetite having fevers at home has been using Spiriva but does not seem to help she does not have a rescue inhaler.  No chest pain abdominal pain no diarrhea Tried to use mucinex and Tylenol Patient takes Lasix daily with not quite sure why she has no echograms in the system.  No leg edema Her daughter was sick with flu and pt was exposed  She used to be on blood thinners for blood clots, but had internal bleeding they could not find a source and placed IVC filter Hx of melena but not bloody Reports her stools have been dark for 1wk She thought it was because of blubbery and blackberries  Had a flu shot this year too   She used to smoke but not any more She have had a few COPD exacerbations over past year  Has  been vaccinated against COVID  x3   Initial COVID TEST  NEGATIVE   Lab Results  Component Value Date   [place] NEGATIVE 03/15/2021    Regarding pertinent Chronic problems:     Hyperlipidemia -  on statins a Pravachol Lipid Panel    HTN on amlodipine benazepril diltiazem Lopressor, lasix     CAD - LHC 08/09/20-NO intervention at that time   Hypothyroidism: No results found for: TSH on  synthroid    obesity-   BMI Readings from Last 1 Encounters:  03/15/21 30.27 kg/m     Hx of irregular heartbeat?? But not A.fib?? on diltaizem/metoprolol PSVT (paroxysmal supraventricular tachycardia)   COPD -  not  on baseline oxygen      While in ER: Tested positive for influenza A White blood cell count 3.3 Appeared to have, COPD exacerbation was given a dose of Solu-Medrol albuterol and Atrovent  nebulizer Chest x-ray without fluid or pneumonia   ED Triage Vitals  Enc Vitals Group     BP 03/15/21 1442 (!) 142/99     Pulse Rate 03/15/21 1442 98     Resp 03/15/21 1442 20     Temp 03/15/21 1442 98.1 F (36.7 C)     Temp Source 03/15/21 1442 Oral     SpO2 03/15/21 1442 93 %     Weight 03/15/21 1442 155 lb (70.3 kg)     Height 03/15/21 1442 5' (1.524 m)     Head Circumference --      Peak Flow --      Pain Score 03/15/21 1521 6     Pain Loc --      Pain Edu? --      Excl. in [place]? --   TMAX(24)@     _________________________________________ Significant initial  Findings: Abnormal Labs Reviewed  RESP PANEL BY RT-PCR (FLU A&B, COVID) ARPGX2 - Abnormal; Notable for the following components:      Result Value   Influenza A by PCR POSITIVE (*)    All other components within normal limits  CBC WITH DIFFERENTIAL/PLATELET - Abnormal; Notable for the following components:   WBC 3.3 (*)    Hemoglobin 11.0 (*)    MCHC 29.9 (*)    RDW 17.6 (*)    Lymphs Abs 0.6 (*)    All other components within normal limits  COMPREHENSIVE METABOLIC PANEL - Abnormal; Notable for the following components:   Sodium 133 (*)    Glucose, Bld 107 (*)    Creatinine, Ser 1.01 (*)    Total Protein 6.4 (*)    GFR, Estimated 56 (*)    All other components within normal limits   ____________________________________________ Ordered    CXR -   NON acute   _________________________ Troponin 8   ECG: Ordered Personally reviewed by me showing: HR : 106 Rhythm:   A.fib. W RVR,    no evidence of  ischemic changes QTC 449 ____________________ This patient meets SIRS Criteria and may be septic.   The recent clinical data is shown below. Vitals:   03/15/21 2100 03/15/21 2115 03/15/21 2140 03/15/21 2219  BP: (!) 143/116 (!) 165/112  (!) 157/110  Pulse: (!) 119 (!) 115  (!) 107  Resp: 20 19  20   Temp:   98.5 F (36.9 C) 98.1 F (36.7 C)  TempSrc:   Oral Oral  SpO2: 94% 94%  96%  Weight:      Height:        WBC     Component Value Date/Time   WBC 3.3 (L) 03/15/2021 1549   LYMPHSABS 0.6 (L) 03/15/2021 1549   MONOABS 0.6 03/15/2021 1549   EOSABS 0.1 03/15/2021 1549   BASOSABS 0.0 03/15/2021 1549    Lactic Acid, Venous    Component Value Date/Time   LATICACIDVEN 0.61 03/27/2014 2107     Procalcitonin <0.1 Lactic Acid, Venous    Component Value Date/Time   LATICACIDVEN 0.61 03/27/2014 2107    Procalcitonin  ordered Urine analysis:    Component Value Date/Time   COLORURINE YELLOW 01/22/2019 [place] 01/22/2019 1418   LABSPEC 1.010 01/22/2019 1418   PHURINE 6.0 01/22/2019 1418   [place] 01/22/2019 1418   [place] 01/22/2019 1418   [place] 01/22/2019 1418   KETONESUR NEGATIVE 01/22/2019 1418   PROTEINUR NEGATIVE 01/22/2019 1418   UROBILINOGEN 0.2 03/27/2014 2129   NITRITE NEGATIVE 01/22/2019 1418   LEUKOCYTESUR NEGATIVE 01/22/2019 1418    Results for orders placed or performed during the hospital encounter of 03/15/21  Resp Panel by RT-PCR (Flu A&B, Covid) Nasopharyngeal Swab     Status: Abnormal   Collection Time: 03/15/21  3:49 PM   Specimen: Nasopharyngeal Swab; Nasopharyngeal(NP) swabs in vial transport medium  Result Value Ref Range Status   SARS Coronavirus 2 by RT PCR NEGATIVE NEGATIVE Final         Influenza A by PCR POSITIVE (A) NEGATIVE Final   Influenza B by PCR NEGATIVE NEGATIVE Final           _______________________________________________ Hospitalist was called for admission for COPD  exacerbation and influeza A  The following Work up has been ordered so far:  Orders Placed This Encounter  Procedures   Resp Panel by RT-PCR (Flu A&B, Covid) Nasopharyngeal Swab   DG Chest 2  View   CBC with Differential/Platelet   Comprehensive metabolic panel   Cardiac monitoring   Consult to hospitalist   Airborne and Contact precautions   ED EKG   EKG 12-Lead   Admit to Inpatient (patient's expected length of stay will be greater than 2 midnights or inpatient only procedure)    Following Medications were ordered in ER: Medications  albuterol (PROVENTIL) (2.5 MG/3ML) 0.083% nebulizer solution 5 mg (5 mg Nebulization Given 03/15/21 1547)  ipratropium (ATROVENT) nebulizer solution 0.5 mg (0.5 mg Nebulization Given 03/15/21 1547)  methylPREDNISolone sodium succinate (SOLU-MEDROL) 125 mg/2 mL injection 125 mg (125 mg Intravenous Given 03/15/21 1611)  metoprolol tartrate (LOPRESSOR) injection 2.5 mg (2.5 mg Intravenous Given 03/15/21 1848)  metoprolol tartrate (LOPRESSOR) injection 5 mg (5 mg Intravenous Given 03/15/21 1927)  acetaminophen (TYLENOL) tablet 1,000 mg (1,000 mg Oral Given 03/15/21 2003)  metoprolol tartrate (LOPRESSOR) tablet 50 mg (50 mg Oral Given 03/15/21 2004)        Consult Orders  (From admission, onward)           Start     Ordered   03/15/21 1724  Consult to hospitalist  Called care Link for Consult talked to [place] at 5:33  Once       Provider:  (Not yet assigned)  Question Answer Comment  Place call to: [facility]   Reason for Consult Admit      03/15/21 1723              OTHER Significant initial  Findings:  labs showing:    Recent Labs  Lab 03/15/21 1549 03/15/21 2330  NA 133*  --   K 4.3  --   CO2 22  --   GLUCOSE 107*  --   BUN 15  --   CREATININE 1.01*  --   CALCIUM 9.4  --   MG  --  1.9  PHOS  --  2.8    Cr    stable,   Lab Results  Component Value Date   CREATININE 1.01 (H) 03/15/2021   CREATININE 1.11 (H)  01/22/2019   CREATININE 0.81 07/29/2016    Recent Labs  Lab 03/15/21 1549  AST 31  ALT 23  ALKPHOS 57  BILITOT 0.3  PROT 6.4*  ALBUMIN 3.8   Lab Results  Component Value Date   CALCIUM 9.4 03/15/2021          Plt: Lab Results  Component Value Date   PLT 197 03/15/2021      COVID-19 Labs  No results for input(s): DDIMER, FERRITIN, LDH, CRP in the last 72 hours.  Lab Results  Component Value Date   SARSCOV2NAA NEGATIVE 03/15/2021      Recent Labs  Lab 03/15/21 1549  WBC 3.3*  NEUTROABS 2.0  HGB 11.0*  HCT 36.8  MCV 87.4  PLT 197    HG/HCT  stable,       Component Value Date/Time   HGB 11.0 (L) 03/15/2021 1549   HCT 36.8 03/15/2021 1549   MCV 87.4 03/15/2021 1549    Cardiac Panel (last 3 results) No results for input(s): CKTOTAL, CKMB, TROPONINI, RELINDX in the last 72 hours.    Cultures:    Component Value Date/Time   SDES  01/22/2019 1418    URINE, RANDOM Performed at [facility], [address] [place], [place] [address]    [facility]  01/22/2019 1418    NONE Performed at [facility], [address] [place]  [place], [place] [address]    CULT (A) 01/22/2019 1418    <10,000 COLONIES/mL INSIGNIFICANT GROWTH Performed at [facility] [address], [place], [place] [address]    REPTSTATUS 01/23/2019 FINAL 01/22/2019 1418     Radiological Exams on Admission: DG Chest 2 View  Result Date: 03/15/2021 CLINICAL DATA:  Cough for 2 days. EXAM: CHEST - 2 VIEW COMPARISON:  01/06/2021 and older studies. FINDINGS: Cardiac silhouette is mildly enlarged. No mediastinal or hilar masses. No evidence of adenopathy. Linear opacity noted in the left mid lung consistent with atelectasis. Mild opacity in the lung bases also consistent with atelectasis. Remainder of the lungs is clear. No convincing pleural effusion and no pneumothorax. 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 No acute skeletal abnormality.  IMPRESSION: 1. No acute cardiopulmonary disease. Mild lung base and left mid lung linear opacities consistent with atelectasis. No convincing pneumonia and no pulmonary edema. Electronically Signed   By: Lajean Manes M.D.   On: 03/15/2021 17:01   _______________________________________________________________________________________________________ Latest  Blood pressure (!) 157/110, pulse (!) 107, temperature 98.1 F (36.7 C), temperature source Oral, resp. rate 20, height 5' (1.524 m), weight 70.3 kg, SpO2 96 %.   Review of Systems:    Pertinent positives include:  Fevers, chills, fatigue,nausea, vomiting,  melena,shortness of breath at rest. dyspnea on exertion,   excess mucus,wheezing. Constitutional:  No weight loss, night sweats,  weight loss  HEENT:  No headaches, Difficulty swallowing,Tooth/dental problems,Sore throat,  No sneezing, itching, ear ache, nasal congestion, post nasal drip,  Cardio-vascular:  No chest pain, Orthopnea, PND, anasarca, dizziness, palpitations.no Bilateral lower extremity swelling  GI:  No heartburn, indigestion, abdominal pain, diarrhea, change in bowel habits, loss of appetite,  blood in stool, hematemesis Resp:  no  no productive cough, No non-productive cough, No coughing up of blood.No change in color of mucus.No  Skin:  no rash or lesions. No jaundice GU:  no dysuria, change in color of urine, no urgency or frequency. No straining to urinate.  No flank pain.  Musculoskeletal:  No joint pain or no joint swelling. No decreased range of motion. No back pain.  Psych:  No change in mood or affect. No depression or anxiety. No memory loss.  Neuro: no localizing neurological complaints, no tingling, no weakness, no double vision, no gait abnormality, no slurred speech, no confusion  All systems reviewed and apart from [place] all are negative _______________________________________________________________________________________________ Past Medical  History:   Past Medical History:  Diagnosis Date   Celiac disease    GERD (gastroesophageal reflux disease)    HOH (hard of hearing)    Hypertension    Interstitial cystitis     Past Surgical History:  Procedure Laterality Date   ABDOMINAL HYSTERECTOMY     APPENDECTOMY     CHOLECYSTECTOMY     ear drum surgery     REPLACEMENT TOTAL KNEE     ROTATOR CUFF REPAIR Right    TONSILLECTOMY      Social History:  Ambulatory   independently       reports that she has quit smoking. Her smoking use included cigarettes. She has never used smokeless tobacco. She reports current alcohol use. She reports that she does not use drugs.    Family History:   Family History  Problem Relation Age of Onset   Hypertension Other    ______________________________________________________________________________________________ Allergies: Allergies  Allergen Reactions   Ativan [Lorazepam]    Augmentin [Amoxicillin-Pot Clavulanate]    Avelox [Moxifloxacin]  Chlorzoxazone    Ciprofloxacin Hcl    Codeine    Crestor [Rosuvastatin]    Darvon [Propoxyphene Hcl]    Doxycycline    Gluten Meal    Nitrofuran Derivatives    Phenergan [Promethazine Hcl]      Prior to Admission medications   Medication Sig Start Date End Date Taking? Authorizing Provider  acetaminophen (TYLENOL) 500 MG tablet Take 1 tablet (500 mg total) by mouth every 6 (six) hours as needed for pain. 06/24/12   [name], MD  amLODipine-benazepril (LOTREL) 5-10 MG per capsule Take 1 capsule by mouth daily.    [provider]  amoxicillin-clavulanate (AUGMENTIN) 875-125 MG tablet Take 1 tablet by mouth every 8 (eight) hours. 07/29/16   Mabe, Forbes Cellar, MD  benzonatate (TESSALON PERLES) 100 MG capsule Take 1 capsule (100 mg total) by mouth 3 (three) times daily as needed for cough. 11/18/13   [name], MD  cephALEXin (KEFLEX) 500 MG capsule Take 500 mg by mouth 4 (four) times daily.    [provider]   chlordiazePOXIDE (LIBRIUM) 25 MG capsule Take 25 mg by mouth 3 (three) times daily as needed.    [provider]  diltiazem (TIAZAC) 120 MG 24 hr capsule Take 120 mg by mouth daily.    [provider]  esomeprazole (NEXIUM) 40 MG capsule Take 40 mg by mouth daily before breakfast.    [provider]  furosemide (LASIX) 40 MG tablet Take 40 mg by mouth.    [provider]  hyoscyamine (ANASPAZ) 0.125 MG TBDP disintergrating tablet Place 0.125 mg under the tongue.    [provider]  ibuprofen (ADVIL,MOTRIN) 400 MG tablet Take 400 mg by mouth every 6 (six) hours as needed.    [provider]  Lansoprazole (PREVACID PO) Take by mouth.    [provider]  levothyroxine (SYNTHROID, LEVOTHROID) 100 MCG tablet Take 75 mcg by mouth daily.     [provider]  losartan (COZAAR) 100 MG tablet Take 100 mg by mouth daily.    [provider]  metoprolol (LOPRESSOR) 100 MG tablet Take 75 mg by mouth daily.    [provider]  mometasone (NASONEX) 50 MCG/ACT nasal spray Place 2 sprays into the nose daily.    [provider]  ondansetron (ZOFRAN ODT) 4 MG disintegrating tablet Take 1 tablet (4 mg total) by mouth every 8 (eight) hours as needed for nausea or vomiting. 01/22/19   Ward, Ozella Almond, PA-C  ondansetron (ZOFRAN) 4 MG tablet Take 1 tablet (4 mg total) by mouth every 6 (six) hours. 07/29/16   Mabe, Forbes Cellar, MD  pentosan polysulfate (ELMIRON) 100 MG capsule Take 100 mg by mouth 3 (three) times daily before meals.    [provider]  pravastatin (PRAVACHOL) 20 MG tablet Take 20 mg by mouth daily.    [provider]  risedronate (ACTONEL) 150 MG tablet Take 150 mg by mouth every 30 (thirty) days. with water on empty stomach, nothing by mouth or lie down for next 30 minutes.    [provider]  sodium chloride 1 G tablet Take 1 g by mouth 3 (three) times daily.    [provider]  Travoprost, BAK Free, (TRAVATAN) 0.004 % SOLN ophthalmic solution 1 drop at bedtime.    [provider]    ___________________________________________________________________________________________________ Physical Exam: Vitals with BMI 03/15/2021 03/15/2021 03/15/2021  Height - - -  Weight - - -  BMI - - -  Systolic  518 841 660  Diastolic 630 160 109  Pulse 107 115 119     1. General:  in No  Acute distress    Chronically ill   -appearing 2. Psychological: Alert and   Oriented 3. Head/ENT:   Dry Mucous Membranes                          Head Non traumatic, neck supple                           Poor Dentition 4. SKIN:  decreased Skin turgor,  Skin clean Dry and intact no rash 5. Heart: Regular rate and rhythm  Murmur, no Rub or gallop 6. Lungs: some  wheezes no crackles   7. Abdomen: Soft,  mild diffuse tender/sensative, Non distended   obese  bowel sounds present 8. Lower extremities: no clubbing, cyanosis, no  edema 9. Neurologically Grossly intact, moving all 4 extremities equally  10. MSK: Normal range of motion    Chart has been reviewed  ______________________________________________________________________________________________  Assessment/Plan   85 y.o. female with medical history significant of COPD celiac, GERD, hypertension recent of lower extremity, OSA not on CPAP, CAD, CKD. IVC filter, aortic valve stenosis, GERD   Admitted for sepsis in the setting of influenza A, COPD exacerbation complicated by atrial fibrillation and melena  Present on Admission: New onset of atrial fibrillation resume metoprolol/diltiazem hold off on anticoagulation given melena and history of GI bleed.  Would benefit from cardiology follow-up.  Order echogram.   Influenza A supportive measures need time if   Anemia -patient endorses melena will obtain anemia panel monitor for any hemoglobin drop.  Hold off anticoagulation for now check Hemoccult stool if  positive may need GI consult   COPD with acute exacerbation ([place]) - Will initiate: Steroid taper  -  Antibiotics Rocephin - Albuterol  PRN, - scheduled duoneb,  -  Breo or Dulera at discharge   -  Mucinex.  Titrate O2 to saturation >90%. Follow patients respiratory status.   influenza positive O   VBG ordered .  Currently mentating well no evidence of symptomatic hypercarbia   Leukopenia possibly related to infectious process    Melena Hemoccult stool monitor hemoglobin if evidence of hemoglobin drop or Hemoccult positive stool will need GI consult   Hyponatremia chronic continue home medication   Hypothyroidism continue Synthroid check TSH   CAD (coronary artery disease) chronic stable continue statin patient not on aspirin secondary to history of bleeding continue beta-blocker   Essential hypertension resume home medications when able to tolerate of note blood pressure cuff Reading with elevated diastolic blood pressures in 110s on examination noted a blood pressure cuff a small built lateral recheck.  And medium size blood pressure cuff     Hyperlipidemia continue Pravachol  Sepsis -initially meeting sepsis criteria with fever and elevated respiratory rate and tachycardia.  Most likely source being secondary to viral infection.  Rehydrate obtain blood cultures for completion and procalcitonin monitor for any secondary bacterial infection  Other plan as per orders.  DVT prophylaxis:  SCD     Code Status:    Code Status: DNR  DNR/DNI  as per patient   I had personally discussed CODE STATUS with patient     Family Communication:   Family not at  Bedside    Disposition Plan:         To home once workup is  complete and patient is stable   Following barriers for discharge:                            Electrolytes corrected                               Anemia stable                                                           Afebrile, white count improving able to transition to  PO antibiotics                             Will need to be able to tolerate PO                                                            Would benefit from PT/OT eval prior to DC  Ordered                     Consults called: Sent chat message to [facility], Please consult cardiology in a.m. regarding atrial fibrillation  Admission status:  ED Disposition     ED Disposition  Admit   Condition  --   [place]: [place] [[id number]]  Level of Care: Telemetry Medical [[id number]]  May [facility] patient to [facility] or [facility] if equivalent level of care is available:: Yes  Interfacility transfer: Yes  Covid Evaluation: Confirmed COVID Negative  Diagnosis: Influenza A [237628]  Admitting Physician: Orma Flaming [[id number]]  Attending Physician: Orma Flaming [[id number]]  Estimated length of stay: past midnight tomorrow  Certification:: I certify this patient will need inpatient services for at least 2 midnights            inpatient     I Expect 2 midnight stay secondary to severity of patient's current illness need for inpatient interventions justified by the following:  hemodynamic instability despite optimal treatment (tachycardia )  Severe lab/radiological/exam abnormalities including:    Influenza A and extensive comorbidities including:    CAD  COPD/asthma     CKD    That are currently affecting medical management.   I expect  patient to be hospitalized for 2 midnights requiring inpatient medical care.  Patient is at high risk for adverse outcome (such as loss of life or disability) if not treated.  Indication for inpatient stay as follows:     Need for IV antibiotics, IV fluids, IV rate controling medications,     Level of care     tele  For  24H       Lab Results  Component Value Date   [place] NEGATIVE 03/15/2021     Precautions: admitted as   Covid Negative    PPE: Used by the provider:   N95  eye Goggles,  Gloves      Claudia Greenley 03/16/2021, 2:04 AM    [facility]  after 2 AM please page floor coverage PA If 7AM-7PM, please contact the day team taking care of the patient using [email or web address]   Patient was evaluated in the context of the global COVID-19 pandemic, which necessitated consideration that the patient might be at risk for infection with the SARS-CoV-2 virus that causes COVID-19. Institutional protocols and algorithms that pertain to the evaluation of patients at risk for COVID-19 are in a state of rapid change based on information released by regulatory bodies including the CDC and federal and state organizations. These policies and algorithms were followed during the patient's care.

## 2021-03-15 NOTE — ED Triage Notes (Signed)
Cough since Thursday. Feeling weak also. Endorses nausea

## 2021-03-15 NOTE — Progress Notes (Signed)
Received a phone call from Facility: [facility]  Requesting MD: Blanchie Dessert Patient with h/o celiac disease, GERd, HTN,  interstitial cystitis, CAD, hx of DVT in 2017, HLD, hypothyroidism, PSVT, stage 2 COPD, CKD stage IV, presenting with worsening cough, weakness, wheezing, shortness of breath x 3 days. She is flu A positive. She is still wheezing and she has albuterol, solumedrol. Also went into atrial fibrillation and ? If new diagnosis or if meds put her into a fib. On no anti coagulation.   Vitals stable, on room air. Heart rate 112-117, giving IV metoprolol.     Plan of care: Needs admit for COPD exac, flu A and new onset afib.   The patient will be accepted for admission to telemetry at [facility] when bed is available.     Nursing staff, Please call the [place] number at the top of Amion at the time of the patient's arrival so that the patient can be paged to the admitting physician.   Casimer Bilis, M.D. [facility]

## 2021-03-15 NOTE — ED Provider Notes (Signed)
[facility] EMERGENCY DEPARTMENT Provider Note   CSN: [id number] Arrival date & time: 03/15/21  1417     History Chief Complaint  Patient presents with   Cough    Amy Hickman is a 85 y.o. female.  Patient is an 85 year old female with a history of celiac, GERD, hypertension, respiratory disease on Spiriva who is presenting today with complaint of worsening cough, weakness, wheezing, shortness of breath.  Patient reports over the last month she has had intermittent nasal congestion and cough since Thanksgiving 3 days prior to arrival her symptoms have worsened.  She is having posttussive emesis, dry heaving, generalized weakness, poor appetite, fever, wheezing, nonproductive cough and shortness of breath.  No cough and shortness of breath is worse with activity and lying down.  She continues to use her Spiriva but does not have a rescue inhaler.  She denies any chest pain or abdominal pain.  No diarrhea.  She is wearing a boot on her right foot because she broke it when she fell trying to plug in a lamp but has not noticed any new leg swelling or pain.  She continues to take her medications as prescribed.  The history is provided by the patient and a relative.  Cough     Past Medical History:  Diagnosis Date   Celiac disease    GERD (gastroesophageal reflux disease)    HOH (hard of hearing)    Hypertension    Interstitial cystitis     There are no problems to display for this patient.   Past Surgical History:  Procedure Laterality Date   ABDOMINAL HYSTERECTOMY     APPENDECTOMY     CHOLECYSTECTOMY     ear drum surgery     REPLACEMENT TOTAL KNEE     ROTATOR CUFF REPAIR Right    TONSILLECTOMY       OB History   No obstetric history on file.     No family history on file.  Social History   Tobacco Use   Smoking status: Never   Smokeless tobacco: Never  Substance Use Topics   Alcohol use: Yes    Comment: rare   Drug use: No    Home  Medications Prior to Admission medications   Medication Sig Start Date End Date Taking? Authorizing Provider  acetaminophen (TYLENOL) 500 MG tablet Take 1 tablet (500 mg total) by mouth every 6 (six) hours as needed for pain. 06/24/12   [name], MD  amLODipine-benazepril (LOTREL) 5-10 MG per capsule Take 1 capsule by mouth daily.    [provider]  amoxicillin-clavulanate (AUGMENTIN) 875-125 MG tablet Take 1 tablet by mouth every 8 (eight) hours. 07/29/16   Mabe, Forbes Cellar, MD  benzonatate (TESSALON PERLES) 100 MG capsule Take 1 capsule (100 mg total) by mouth 3 (three) times daily as needed for cough. 11/18/13   [name], MD  cephALEXin (KEFLEX) 500 MG capsule Take 500 mg by mouth 4 (four) times daily.    [provider]  chlordiazePOXIDE (LIBRIUM) 25 MG capsule Take 25 mg by mouth 3 (three) times daily as needed.    [provider]  diltiazem (TIAZAC) 120 MG 24 hr capsule Take 120 mg by mouth daily.    [provider]  esomeprazole (NEXIUM) 40 MG capsule Take 40 mg by mouth daily before breakfast.    [provider]  furosemide (LASIX) 40 MG tablet Take 40 mg by mouth.    [provider]  hyoscyamine (ANASPAZ) 0.125 MG TBDP  disintergrating tablet Place 0.125 mg under the tongue.    [provider]  ibuprofen (ADVIL,MOTRIN) 400 MG tablet Take 400 mg by mouth every 6 (six) hours as needed.    [provider]  Lansoprazole (PREVACID PO) Take by mouth.    [provider]  levothyroxine (SYNTHROID, LEVOTHROID) 100 MCG tablet Take 75 mcg by mouth daily.     [provider]  losartan (COZAAR) 100 MG tablet Take 100 mg by mouth daily.    [provider]  metoprolol (LOPRESSOR) 100 MG tablet Take 75 mg by mouth daily.    [provider]  mometasone (NASONEX) 50 MCG/ACT nasal spray Place 2 sprays into the nose daily.    [provider]  ondansetron (ZOFRAN ODT) 4 MG disintegrating  tablet Take 1 tablet (4 mg total) by mouth every 8 (eight) hours as needed for nausea or vomiting. 01/22/19   Ward, Ozella Almond, PA-C  ondansetron (ZOFRAN) 4 MG tablet Take 1 tablet (4 mg total) by mouth every 6 (six) hours. 07/29/16   Mabe, Forbes Cellar, MD  pentosan polysulfate (ELMIRON) 100 MG capsule Take 100 mg by mouth 3 (three) times daily before meals.    [provider]  pravastatin (PRAVACHOL) 20 MG tablet Take 20 mg by mouth daily.    [provider]  risedronate (ACTONEL) 150 MG tablet Take 150 mg by mouth every 30 (thirty) days. with water on empty stomach, nothing by mouth or lie down for next 30 minutes.    [provider]  sodium chloride 1 G tablet Take 1 g by mouth 3 (three) times daily.    [provider]  Travoprost, BAK Free, (TRAVATAN) 0.004 % SOLN ophthalmic solution 1 drop at bedtime.    [provider]    Allergies    Ativan [lorazepam], Augmentin [amoxicillin-pot clavulanate], Avelox [moxifloxacin], Chlorzoxazone, Ciprofloxacin hcl, Codeine, Crestor [rosuvastatin], Darvon [propoxyphene hcl], Doxycycline, Gluten meal, Nitrofuran derivatives, and Phenergan [promethazine hcl]  Review of Systems   Review of Systems  Respiratory:  Positive for cough.   All other systems reviewed and are negative.  Physical Exam Updated Vital Signs BP (!) 142/99 (BP Location: Right Arm)   Pulse 98   Temp 98.1 F (36.7 C) (Oral)   Resp 20   Ht 5' (1.524 m)   Wt 70.3 kg   SpO2 97%   BMI 30.27 kg/m   Physical Exam Vitals and nursing note reviewed.  Constitutional:      General: She is not in acute distress.    Appearance: Normal appearance. She is well-developed.  HENT:     Head: Normocephalic and atraumatic.  Eyes:     Pupils: Pupils are equal, round, and reactive to light.  Cardiovascular:     Rate and Rhythm: Regular rhythm. Tachycardia present.     Pulses: Normal pulses.     Heart sounds: Normal heart sounds. No murmur heard.    No friction rub.  Pulmonary:     Effort: Pulmonary effort is normal. Tachypnea present.     Breath sounds: Wheezing present. No rales.     Comments: Diffuse severe expiratory Abdominal:     General: Bowel sounds are normal. There is no distension.     Palpations: Abdomen is soft.     Tenderness: There is no abdominal tenderness. There is no guarding or rebound.  Musculoskeletal:        General: No tenderness. Normal range of motion.     Cervical back: Normal range of motion and  neck supple.     Comments: Postop shoe present on the right foot with minimal edema of the foot.  No significant edema in her lower extremities.  No calf pain.  Skin:    General: Skin is warm and dry.     Findings: No rash.  Neurological:     Mental Status: She is alert and oriented to person, place, and time. Mental status is at baseline.     Cranial Nerves: No cranial nerve deficit.  Psychiatric:        Mood and Affect: Mood normal.        Behavior: Behavior normal.    ED Results / Procedures / Treatments   Labs (all labs ordered are listed, but only abnormal results are displayed) Labs Reviewed  RESP PANEL BY RT-PCR (FLU A&B, COVID) ARPGX2 - Abnormal; Notable for the following components:      Result Value   Influenza A by PCR POSITIVE (*)    All other components within normal limits  CBC WITH DIFFERENTIAL/PLATELET - Abnormal; Notable for the following components:   WBC 3.3 (*)    Hemoglobin 11.0 (*)    MCHC 29.9 (*)    RDW 17.6 (*)    Lymphs Abs 0.6 (*)    All other components within normal limits  COMPREHENSIVE METABOLIC PANEL - Abnormal; Notable for the following components:   Sodium 133 (*)    Glucose, Bld 107 (*)    Creatinine, Ser 1.01 (*)    Total Protein 6.4 (*)    GFR, Estimated 56 (*)    All other components within normal limits    EKG EKG Interpretation  Date/Time:  Saturday March 15 2021 17:19:20 EST Ventricular Rate:  106 PR Interval:    QRS Duration: 92 QT  Interval:  330 QTC Calculation: 439 R Axis:   34 Text Interpretation: new Atrial fibrillation Anteroseptal infarct, age indeterminate Confirmed by Blanchie Dessert [phone]) on 03/15/2021 5:22:08 PM  Radiology DG Chest 2 View  Result Date: 03/15/2021 CLINICAL DATA:  Cough for 2 days. EXAM: CHEST - 2 VIEW COMPARISON:  01/06/2021 and older studies. FINDINGS: Cardiac silhouette is mildly enlarged. No mediastinal or hilar masses. No evidence of adenopathy. Linear opacity noted in the left mid lung consistent with atelectasis. Mild opacity in the lung bases also consistent with atelectasis. Remainder of the lungs is clear. No convincing pleural effusion and no pneumothorax. 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 No acute skeletal abnormality. IMPRESSION: 1. No acute cardiopulmonary disease. Mild lung base and left mid lung linear opacities consistent with atelectasis. No convincing pneumonia and no pulmonary edema. Electronically Signed   By: Lajean Manes M.D.   On: 03/15/2021 17:01    Procedures Procedures   Medications Ordered in ED Medications  albuterol (PROVENTIL) (2.5 MG/3ML) 0.083% nebulizer solution 5 mg (has no administration in time range)  ipratropium (ATROVENT) nebulizer solution 0.5 mg (has no administration in time range)  methylPREDNISolone sodium succinate (SOLU-MEDROL) 125 mg/2 mL injection 125 mg (has no administration in time range)    ED Course  I have reviewed the triage vital signs and the nursing notes.  Pertinent labs & imaging results that were available during my care of the patient were reviewed by me and considered in my medical decision making (see chart for details).    MDM Rules/Calculators/A&P                           Elderly female presenting today  with flulike symptoms in addition to shortness of breath, diffuse wheezing and cough.  Low suspicion for CHF exacerbation as patient does take Lasix daily but does not have significant  evidence of fluid overload at this time.  Patient does take Spiriva regularly but does not have a rescue inhaler at home.  Also she has had symptoms over the last month but in the last 3 days her symptoms are much worse and suspect most likely the medication.  Low suspicion for PE at this time.  Patient given albuterol, Atrovent, Solu-Medrol.  X-ray, EKG and labs are pending  5:23 PM After treatments patient now is tachycardic and has evidence of atrial fibrillation without prior episodes.  She was getting in the breathing treatment prior to being connected to the monitor and unclear if A. fib was present prior to her treatment.  Wheezing has improved some but is still present.  Chest x-ray without evidence of fluid or pneumonia.  Labs with mild leukopenia but otherwise stable CBC and BMP.  Patient's flu a is positive.  With patient's ongoing wheezing, new onset atrial fibrillation and flu positive will admit for ongoing care.  Patient and her family are comfortable with this plan.  MDM   Amount and/or Complexity of Data Reviewed Clinical lab tests: reviewed and ordered Tests in the radiology section of CPT: ordered and reviewed Tests in the medicine section of CPT: ordered and reviewed Independent visualization of images, tracings, or specimens: yes    CHA2DS2/VAS Stroke Risk Points      N/A >= 2 Points: High Risk  1 - 1.99 Points: Medium Risk  0 Points: Low Risk    Last Change: N/A      This score determines the patient's risk of having a stroke if the  patient has atrial fibrillation.      This score is not applicable to this patient. Components are not  calculated.       Final Clinical Impression(s) / ED Diagnoses Final diagnoses:  Influenza  Wheezing  Atrial fibrillation, unspecified type [facility])    Rx / DC Orders ED Discharge Orders     None        Blanchie Dessert, MD 03/15/21 1725

## 2021-03-15 NOTE — ED Notes (Signed)
Attempted to call report, RN unavailable, will call back. 

## 2021-03-16 ENCOUNTER — Inpatient Hospital Stay (HOSPITAL_COMMUNITY): Payer: Medicare Other

## 2021-03-16 DIAGNOSIS — K219 Gastro-esophageal reflux disease without esophagitis: Secondary | ICD-10-CM

## 2021-03-16 DIAGNOSIS — Z86718 Personal history of other venous thrombosis and embolism: Secondary | ICD-10-CM

## 2021-03-16 DIAGNOSIS — A419 Sepsis, unspecified organism: Secondary | ICD-10-CM

## 2021-03-16 DIAGNOSIS — I4891 Unspecified atrial fibrillation: Secondary | ICD-10-CM

## 2021-03-16 LAB — URINALYSIS, ROUTINE W REFLEX MICROSCOPIC
Bilirubin Urine: NEGATIVE
Glucose, UA: NEGATIVE mg/dL
Hgb urine dipstick: NEGATIVE
Ketones, ur: 5 mg/dL — AB
Leukocytes,Ua: NEGATIVE
Nitrite: POSITIVE — AB
Protein, ur: NEGATIVE mg/dL
Specific Gravity, Urine: 1.009 (ref 1.005–1.030)
pH: 5 (ref 5.0–8.0)

## 2021-03-16 LAB — ECHOCARDIOGRAM COMPLETE
AR max vel: 1.07 cm2
AV Area VTI: 0.99 cm2
AV Area mean vel: 0.98 cm2
AV Mean grad: 11 mmHg
AV Peak grad: 19.2 mmHg
Ao pk vel: 2.19 m/s
Height: 60 in
S' Lateral: 3.3 cm
Weight: 2480 oz

## 2021-03-16 LAB — CBC WITH DIFFERENTIAL/PLATELET
Abs Immature Granulocytes: 0.01 10*3/uL (ref 0.00–0.07)
Basophils Absolute: 0 10*3/uL (ref 0.0–0.1)
Basophils Relative: 1 %
Eosinophils Absolute: 0 10*3/uL (ref 0.0–0.5)
Eosinophils Relative: 0 %
HCT: 34.5 % — ABNORMAL LOW (ref 36.0–46.0)
Hemoglobin: 10.6 g/dL — ABNORMAL LOW (ref 12.0–15.0)
Immature Granulocytes: 1 %
Lymphocytes Relative: 11 %
Lymphs Abs: 0.2 10*3/uL — ABNORMAL LOW (ref 0.7–4.0)
MCH: 26.7 pg (ref 26.0–34.0)
MCHC: 30.7 g/dL (ref 30.0–36.0)
MCV: 86.9 fL (ref 80.0–100.0)
Monocytes Absolute: 0.1 10*3/uL (ref 0.1–1.0)
Monocytes Relative: 3 %
Neutro Abs: 1.8 10*3/uL (ref 1.7–7.7)
Neutrophils Relative %: 84 %
Platelets: 194 10*3/uL (ref 150–400)
RBC: 3.97 MIL/uL (ref 3.87–5.11)
RDW: 17.5 % — ABNORMAL HIGH (ref 11.5–15.5)
WBC: 2.1 10*3/uL — ABNORMAL LOW (ref 4.0–10.5)
nRBC: 0 % (ref 0.0–0.2)

## 2021-03-16 LAB — VITAMIN B12: Vitamin B-12: 2473 pg/mL — ABNORMAL HIGH (ref 180–914)

## 2021-03-16 LAB — MAGNESIUM
Magnesium: 1.9 mg/dL (ref 1.7–2.4)
Magnesium: 1.9 mg/dL (ref 1.7–2.4)

## 2021-03-16 LAB — FOLATE: Folate: 42.5 ng/mL (ref >5.9)

## 2021-03-16 LAB — PHOSPHORUS
Phosphorus: 2.8 mg/dL (ref 2.5–4.6)
Phosphorus: 2.8 mg/dL (ref 2.5–4.6)

## 2021-03-16 LAB — CK: Total CK: 72 U/L (ref 38–234)

## 2021-03-16 LAB — TROPONIN I (HIGH SENSITIVITY)
Troponin I (High Sensitivity): 8 ng/L (ref <18)
Troponin I (High Sensitivity): 8 ng/L (ref <18)

## 2021-03-16 LAB — COMPREHENSIVE METABOLIC PANEL
ALT: 22 U/L (ref 0–44)
AST: 30 U/L (ref 15–41)
Albumin: 3.7 g/dL (ref 3.5–5.0)
Alkaline Phosphatase: 58 U/L (ref 38–126)
Anion gap: 10 (ref 5–15)
BUN: 14 mg/dL (ref 8–23)
CO2: 20 mmol/L — ABNORMAL LOW (ref 22–32)
Calcium: 9.1 mg/dL (ref 8.9–10.3)
Chloride: 100 mmol/L (ref 98–111)
Creatinine, Ser: 0.96 mg/dL (ref 0.44–1.00)
GFR, Estimated: 59 mL/min — ABNORMAL LOW (ref >60)
Glucose, Bld: 182 mg/dL — ABNORMAL HIGH (ref 70–99)
Potassium: 4.5 mmol/L (ref 3.5–5.1)
Sodium: 130 mmol/L — ABNORMAL LOW (ref 135–145)
Total Bilirubin: 0.8 mg/dL (ref 0.3–1.2)
Total Protein: 6.3 g/dL — ABNORMAL LOW (ref 6.5–8.1)

## 2021-03-16 LAB — IRON AND TIBC
Iron: 20 ug/dL — ABNORMAL LOW (ref 28–170)
Saturation Ratios: 4 % — ABNORMAL LOW (ref 10.4–31.8)
TIBC: 472 ug/dL — ABNORMAL HIGH (ref 250–450)
UIBC: 452 ug/dL

## 2021-03-16 LAB — TSH: TSH: 0.589 u[IU]/mL (ref 0.350–4.500)

## 2021-03-16 LAB — MRSA NEXT GEN BY PCR, NASAL: MRSA by PCR Next Gen: NOT DETECTED

## 2021-03-16 LAB — PROCALCITONIN: Procalcitonin: <0.1 ng/mL

## 2021-03-16 LAB — FERRITIN: Ferritin: 36 ng/mL (ref 11–307)

## 2021-03-16 LAB — OCCULT BLOOD X 1 CARD TO LAB, STOOL: Fecal Occult Bld: NEGATIVE

## 2021-03-16 LAB — LACTIC ACID, PLASMA
Lactic Acid, Venous: 1.2 mmol/L (ref 0.5–1.9)
Lactic Acid, Venous: 1.3 mmol/L (ref 0.5–1.9)

## 2021-03-16 MED ORDER — PANTOPRAZOLE SODIUM 40 MG IV SOLR
40.0000 mg | Freq: Two times a day (BID) | INTRAVENOUS | Status: DC
  Administered 2021-03-16 – 2021-03-17 (×4): 40 mg via INTRAVENOUS
  Filled 2021-03-16 (×4): qty 40

## 2021-03-16 MED ORDER — FUROSEMIDE 20 MG PO TABS
20.0000 mg | ORAL_TABLET | Freq: Every day | ORAL | Status: DC
  Administered 2021-03-17 – 2021-03-21 (×5): 20 mg via ORAL
  Filled 2021-03-16 (×5): qty 1

## 2021-03-16 MED ORDER — IPRATROPIUM-ALBUTEROL 0.5-2.5 (3) MG/3ML IN SOLN
3.0000 mL | Freq: Three times a day (TID) | RESPIRATORY_TRACT | Status: DC
  Administered 2021-03-17 – 2021-03-18 (×4): 3 mL via RESPIRATORY_TRACT
  Filled 2021-03-16 (×5): qty 3

## 2021-03-16 MED ORDER — DILTIAZEM LOAD VIA INFUSION
15.0000 mg | Freq: Once | INTRAVENOUS | Status: AC
  Administered 2021-03-16: 15:00:00 15 mg via INTRAVENOUS
  Filled 2021-03-16: qty 15

## 2021-03-16 MED ORDER — DILTIAZEM HCL-DEXTROSE 125-5 MG/125ML-% IV SOLN (PREMIX)
5.0000 mg/h | INTRAVENOUS | Status: DC
  Administered 2021-03-16: 15:00:00 5 mg/h via INTRAVENOUS
  Administered 2021-03-17: 20:00:00 15 mg/h via INTRAVENOUS
  Administered 2021-03-17: 09:00:00 5 mg/h via INTRAVENOUS
  Administered 2021-03-18: 15 mg/h via INTRAVENOUS
  Filled 2021-03-16 (×5): qty 125

## 2021-03-16 NOTE — Assessment & Plan Note (Signed)
Chronic. Stable.

## 2021-03-16 NOTE — Assessment & Plan Note (Signed)
Previously on Coumadin, now s/p IVC filter.

## 2021-03-16 NOTE — Assessment & Plan Note (Addendum)
Fecal occult test negative.

## 2021-03-16 NOTE — Assessment & Plan Note (Signed)
Chronic. Patient with a history of SIADH. Patient is on salt tablets and Lasix as an outpatient -Continue salt tablets/Lasix

## 2021-03-16 NOTE — Progress Notes (Signed)
PROGRESS NOTE    TAKODA SIEDLECKI  HCW:[id number] DOB: [birth year] DOA: 03/15/2021 PCP: Drosinis, Pamalee Leyden, PA-C   Brief Narrative: Amy Hickman is a 85 y.o. female with a history of COPD celiac, GERD, hypertension recent of lower extremity, OSA not on CPAP, CAD, CKD. IVC filter, aortic valve stenosis, GERD. Patient presented secondary to cough and was found to have influenza infection with evidence of sepsis and COPD exacerbation. While admitted, patient also developed new onset atrial fibrillation with RVR, now on a Cardizem drip. Cardiology consulted.   Assessment & Plan:   * Sepsis ([place]) Present on admission. Secondary to Influenza infection. Blood cultures obtained on admission and are pending.  Atrial fibrillation with RVR ([place]) Per chart review, patient has a history of SVT and is on metoprolol and Cardizem. Atrial fibrillation is new and possibly related to acute illness. Associated RVR but otherwise hemodynamically stable. Discussed with cardiology who recommended starting Cardizem IV infusion -Continue Cardizem/metoprolol -Start Cardizem IV -Transthoracic Echocardiogram pending  GERD (gastroesophageal reflux disease) -Continue Protonix  Essential hypertension -Continue Diltiazem/metoprolol  Hypothyroidism -Continue Synthroid  Hyponatremia Chronic. Patient with a history of SIADH. Patient is on salt tablets and Lasix as an outpatient -Continue salt tablets/Lasix  Dark stools Unsure if truly melena. -FOBT  Leukopenia Secondary to lymphocytopenia. ANC of 1800. Likely related to acute viral illness.  COPD with acute exacerbation ([place]) Secondary to influenza infection. Respiratory status appears to be improved. -Continue Duonebs, Steroids -Continue Ceftriaxone  Anemia Chronic. Stable.  Influenza A -Tamiflu  History of DVT (deep vein thrombosis) Previously on Coumadin, now s/p IVC filter.    DVT prophylaxis: SCDs Code Status:   Code Status:  DNR Family Communication: Son and granddaughter at bedside Disposition Plan: Discharge likely home in 2-3 days pending improvement of atrial fibrillation, blood culture results   Consultants:  Cardiology  Procedures:  None  Antimicrobials: Ceftriaxone Tamiflu    Subjective: Feeling better today. No dyspnea or chest pain.  Objective: Vitals:   03/16/21 0900 03/16/21 0916 03/16/21 1000 03/16/21 1100  BP: (!) 160/117  (!) 147/100 (!) 145/105  Pulse: (!) 130  (!) 152 (!) 118  Resp: (!) 21  (!) 26 15  Temp:      TempSrc:      SpO2: 98% 97% 93% 95%  Weight:      Height:        Intake/Output Summary (Last 24 hours) at 03/16/2021 1314 Last data filed at 03/16/2021 1000 Gross per 24 hour  Intake 712.68 ml  Output --  Net 712.68 ml   Filed Weights   03/15/21 1442  Weight: 70.3 kg    Examination:  General exam: Appears calm and comfortable Respiratory system: Clear to auscultation. Respiratory effort normal. Cardiovascular system: S1 & S2 heard, irregular rhythm with normal rate. Gastrointestinal system: Abdomen is nondistended, soft and nontender. No organomegaly or masses felt. Normal bowel sounds heard. Central nervous system: Alert and oriented. No focal neurological deficits. Musculoskeletal: No edema. No calf tenderness Skin: No cyanosis. No rashes Psychiatry: Judgement and insight appear normal. Mood & affect appropriate.     Data Reviewed: I have personally reviewed following labs and imaging studies  CBC Lab Results  Component Value Date   WBC 2.1 (L) 03/16/2021   RBC 3.97 03/16/2021   HGB 10.6 (L) 03/16/2021   HCT 34.5 (L) 03/16/2021   MCV 86.9 03/16/2021   MCH 26.7 03/16/2021   PLT 194 03/16/2021   MCHC 30.7 03/16/2021   RDW 17.5 (H) 03/16/2021  LYMPHSABS 0.2 (L) 03/16/2021   MONOABS 0.1 03/16/2021   EOSABS 0.0 03/16/2021   BASOSABS 0.0 96/78/9381     Last metabolic panel Lab Results  Component Value Date   NA 130 (L) 03/16/2021   K  4.5 03/16/2021   CL 100 03/16/2021   CO2 20 (L) 03/16/2021   BUN 14 03/16/2021   CREATININE 0.96 03/16/2021   GLUCOSE 182 (H) 03/16/2021   GFRNONAA 59 (L) 03/16/2021   GFRAA 54 (L) 01/22/2019   CALCIUM 9.1 03/16/2021   PHOS 2.8 03/16/2021   PROT 6.3 (L) 03/16/2021   ALBUMIN 3.7 03/16/2021   BILITOT 0.8 03/16/2021   ALKPHOS 58 03/16/2021   AST 30 03/16/2021   ALT 22 03/16/2021   ANIONGAP 10 03/16/2021    CBG (last 3)  No results for input(s): GLUCAP in the last 72 hours.   GFR: Estimated Creatinine Clearance: 39.5 mL/min (by C-G formula based on SCr of 0.96 mg/dL).  Coagulation Profile: No results for input(s): INR, PROTIME in the last 168 hours.  Recent Results (from the past 240 hour(s))  Resp Panel by RT-PCR (Flu A&B, Covid) Nasopharyngeal Swab     Status: Abnormal   Collection Time: 03/15/21  3:49 PM   Specimen: Nasopharyngeal Swab; Nasopharyngeal(NP) swabs in vial transport medium  Result Value Ref Range Status   SARS Coronavirus 2 by RT PCR NEGATIVE NEGATIVE Final    Comment: (NOTE) SARS-CoV-2 target nucleic acids are NOT DETECTED.  The SARS-CoV-2 RNA is generally detectable in upper respiratory specimens during the acute phase of infection. The lowest concentration of SARS-CoV-2 viral copies this assay can detect is 138 copies/mL. A negative result does not preclude SARS-Cov-2 infection and should not be used as the sole basis for treatment or other patient management decisions. A negative result may occur with  improper specimen collection/handling, submission of specimen other than nasopharyngeal swab, presence of viral mutation(s) within the areas targeted by this assay, and inadequate number of viral copies(<138 copies/mL). A negative result must be combined with clinical observations, patient history, and epidemiological information. The expected result is Negative.  Fact Sheet for Patients:  EntrepreneurPulse.com.au  Fact Sheet for  Healthcare Providers:  IncredibleEmployment.be  This test is no t yet approved or cleared by the [place] FDA and  has been authorized for detection and/or diagnosis of SARS-CoV-2 by FDA under an Emergency Use Authorization (EUA). This EUA will remain  in effect (meaning this test can be used) for the duration of the COVID-19 declaration under Section 564(b)(1) of the Act, 21 U.S.C.section 360bbb-3(b)(1), unless the authorization is terminated  or revoked sooner.       Influenza A by PCR POSITIVE (A) NEGATIVE Final   Influenza B by PCR NEGATIVE NEGATIVE Final    Comment: (NOTE) The Xpert Xpress SARS-CoV-2/FLU/RSV plus assay is intended as an aid in the diagnosis of influenza from Nasopharyngeal swab specimens and should not be used as a sole basis for treatment. Nasal washings and aspirates are unacceptable for Xpert Xpress SARS-CoV-2/FLU/RSV testing.  Fact Sheet for Patients: EntrepreneurPulse.com.au  Fact Sheet for Healthcare Providers: IncredibleEmployment.be  This test is not yet approved or cleared by the [place] FDA and has been authorized for detection and/or diagnosis of SARS-CoV-2 by FDA under an Emergency Use Authorization (EUA). This EUA will remain in effect (meaning this test can be used) for the duration of the COVID-19 declaration under Section 564(b)(1) of the Act, 21 U.S.C. section 360bbb-3(b)(1), unless the authorization is terminated or revoked.  Performed at  [facility], [address] [place], [place] [address]   MRSA Next Gen by PCR, Nasal     Status: None   Collection Time: 03/16/21 12:56 AM   Specimen: Urine, Clean Catch; Nasal Swab  Result Value Ref Range Status   MRSA by PCR Next Gen NOT DETECTED NOT DETECTED Final    Comment: (NOTE) The GeneXpert MRSA Assay (FDA approved for NASAL specimens only), is one component of a comprehensive MRSA colonization  surveillance program. It is not intended to diagnose MRSA infection nor to guide or monitor treatment for MRSA infections. Test performance is not FDA approved in patients less than 65 years old. Performed at [facility], [place] [address], [place], [place] [address]         Radiology Studies: DG Chest 2 View  Result Date: 03/15/2021 CLINICAL DATA:  Cough for 2 days. EXAM: CHEST - 2 VIEW COMPARISON:  01/06/2021 and older studies. FINDINGS: Cardiac silhouette is mildly enlarged. No mediastinal or hilar masses. No evidence of adenopathy. Linear opacity noted in the left mid lung consistent with atelectasis. Mild opacity in the lung bases also consistent with atelectasis. Remainder of the lungs is clear. No convincing pleural effusion and no pneumothorax. 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 No acute skeletal abnormality. IMPRESSION: 1. No acute cardiopulmonary disease. Mild lung base and left mid lung linear opacities consistent with atelectasis. No convincing pneumonia and no pulmonary edema. Electronically Signed   By: Lajean Manes M.D.   On: 03/15/2021 17:01        Scheduled Meds:  dextromethorphan  30 mg Oral BID   diltiazem  15 mg Intravenous Once   ipratropium-albuterol  3 mL Nebulization Q6H   latanoprost  1 drop Both Eyes QHS   levothyroxine  75 mcg Oral Daily   methylPREDNISolone (SOLU-MEDROL) injection  40 mg Intravenous Q12H   Followed by   Derrill Memo ON 03/17/2021] predniSONE  40 mg Oral Q breakfast   metoprolol tartrate  50 mg Oral Daily   oseltamivir  30 mg Oral BID   pantoprazole (PROTONIX) IV  40 mg Intravenous Q12H   sodium chloride  1 g Oral TID   Continuous Infusions:  cefTRIAXone (ROCEPHIN)  IV 1 g (03/16/21 0108)   diltiazem (CARDIZEM) infusion       LOS: 1 day     Cordelia Poche, MD [facility] 03/16/2021, 1:14 PM  If 7PM-7AM, please contact night-coverage [email or web address]

## 2021-03-16 NOTE — Progress Notes (Signed)
  Echocardiogram 2D Echocardiogram has been performed.  Merrie Roof F 03/16/2021, 1:07 PM

## 2021-03-16 NOTE — Assessment & Plan Note (Addendum)
Secondary to lymphocytopenia. ANC of 1800. Likely related to acute viral illness. Resolved.

## 2021-03-16 NOTE — Assessment & Plan Note (Signed)
Present on admission. Secondary to Influenza infection. Blood cultures obtained on admission and are pending.

## 2021-03-16 NOTE — Evaluation (Signed)
Occupational Therapy Evaluation Patient Details Name: Amy Hickman MRN: [id number] DOB: [birth year] Today's Date: 03/16/2021   History of Present Illness Amy Hickman is a 85 y.o. female admitted with flu A, shortness of breath. Pt with medical history significant of COPD celiac, GERD, hypertension recent of lower extremity, OSA not on CPAP, CAD, CKD. IVC filter, aortic valve stenosis, GERD   Clinical Impression   Patient evaluated by Occupational Therapy with no further acute OT needs identified. All education has been completed and the patient has no further questions. Pt and son educated on energy conservation concepts and DME/AE options to support this. Medical alert button recommended due to pt with h/o fall 3 weeks ago with subsequent foot fracture. Will defer to PT for AD recommendations for gait. See below for any follow-up Occupational Therapy or equipment needs. OT is signing off. Thank you for this referral.       Recommendations for follow up therapy are one component of a multi-disciplinary discharge planning process, led by the attending physician.  Recommendations may be updated based on patient status, additional functional criteria and insurance authorization.   Follow Up Recommendations  No OT follow up    Assistance Recommended at Discharge Intermittent Supervision/Assistance  Functional Status Assessment  Patient has had a recent decline in their functional status and demonstrates the ability to make significant improvements in function in a reasonable and predictable amount of time.  Equipment Recommendations  Other (comment) (Medical Alert button. Son aware and to follow up.)    Recommendations for Other Services PT consult     Precautions / Restrictions Precautions Precautions: Fall Precaution Comments: Mon HR/BP Restrictions Weight Bearing Restrictions: No Other Position/Activity Restrictions: Recent broken foot from fall 3 weeks ago. Pt and son denied  WB restrictions with Orthopedic shoe.      Mobility Bed Mobility Overal bed mobility: Modified Independent             General bed mobility comments: HOB elevated.    Transfers Overall transfer level: Modified independent                 General transfer comment: Pt is careful and slow. Cues for line management only.      Balance Overall balance assessment: History of Falls;Mild deficits observed, not formally tested                                         ADL either performed or assessed with clinical judgement   ADL Overall ADL's : At baseline                                       General ADL Comments: Pt able to demonstrate LE dressing, standing at sink for grooming, toilet t/f to standard heigh toilet and all toileting Mod I.  Pt with a light stumble once initially standing, but no other losses of balance without AD and no need for external assistance.     Vision Baseline Vision/History: 1 Wears glasses Ability to See in Adequate Light: 0 Adequate Vision Assessment?: No apparent visual deficits     Perception     Praxis Praxis Praxis: Intact    Pertinent Vitals/Pain Pain Assessment: No/denies pain     Hand Dominance     Extremity/Trunk Assessment Upper Extremity Assessment Upper Extremity Assessment:  Overall [facility] for tasks assessed   Lower Extremity Assessment Lower Extremity Assessment: Overall WFL for tasks assessed   Cervical / Trunk Assessment Cervical / Trunk Assessment: Normal   Communication Communication Communication:  (Wearing hearing aids)   Cognition Arousal/Alertness: Awake/alert Behavior During Therapy: WFL for tasks assessed/performed Overall Cognitive Status: Within Functional Limits for tasks assessed                                       General Comments       Exercises     Shoulder Instructions      Home Living Family/patient expects to be discharged to:: Private  residence Living Arrangements: Alone Available Help at Discharge: Family;Available PRN/intermittently;Other (Comment) (Family is <3 miles away and visit frequently.) Type of Home: House Home Access: Level entry     Home Layout: Multi-level Alternate Level Stairs-Number of Steps: Chair lift between floors which pt uses consistently.   Bathroom Shower/Tub: Occupational psychologist: Standard     Home Equipment: Rollator (4 wheels);Rolling Walker (2 wheels);Electric scooter   Additional Comments: Stair lift; RT Ortho shoe. Pt endorses fall and broken foot 3 weeks ago. Pt denied WB restrictions with Orthopedic shoe. Reacher      Prior Functioning/Environment Prior Level of Function : Independent/Modified Independent;History of Falls (last six months)             Mobility Comments: Does not use AD. Pt reports all above DME was for her husband who has passed away. ADLs Comments: Independent with all Basic and instrumental ADLs.        OT Problem List: Decreased activity tolerance      OT Treatment/Interventions:      OT Goals(Current goals can be found in the care plan section) Acute Rehab OT Goals Patient Stated Goal: To resume taking care of self and house. OT Goal Formulation: All assessment and education complete, DC therapy Potential to Achieve Goals: Good  OT Frequency:     Barriers to D/C:            Co-evaluation              AM-PAC OT "6 Clicks" Daily Activity     Outcome Measure Help from another person eating meals?: None Help from another person taking care of personal grooming?: None Help from another person toileting, which includes using toliet, bedpan, or urinal?: None Help from another person bathing (including washing, rinsing, drying)?: A Little Help from another person to put on and taking off regular upper body clothing?: None Help from another person to put on and taking off regular lower body clothing?: None 6 Click Score: 23    End of Session Nurse Communication: Mobility status  Activity Tolerance: Patient tolerated treatment well Patient left: in bed;with call bell/phone within reach;with bed alarm set;with family/visitor present  OT Visit Diagnosis: History of falling (Z91.81);Unsteadiness on feet (R26.81)                Time: 1023-1100 OT Time Calculation (min): 37 min Charges:  OT General Charges $OT Visit: 1 Visit OT Evaluation $OT Eval Low Complexity: 1 Low OT Treatments $Self Care/Home Management : 8-22 mins  Anderson Malta, OT Acute Rehab Services Office: [phone] 03/16/2021 Julien Girt 03/16/2021, 11:10 AM

## 2021-03-16 NOTE — Assessment & Plan Note (Signed)
Continue Protonix °

## 2021-03-16 NOTE — Assessment & Plan Note (Addendum)
Secondary to influenza infection. Respiratory status appears to be improved however persistent wheezing -Continue Duonebs but switch to q4 hours while awake for the next two days followed by QID. Continues steroids -Continue Ceftriaxone

## 2021-03-16 NOTE — Assessment & Plan Note (Addendum)
Per chart review, patient has a history of SVT and is on metoprolol and Cardizem. Atrial fibrillation is new and possibly related to acute illness. Associated RVR but otherwise hemodynamically stable. Discussed with cardiology who recommended starting Cardizem IV infusion -Continue metoprolol -Continue Cardizem IV -Cardiology recommendations: Transesophageal Echocardiogram with cardioversion planned for today, 11/29. Consideration of starting Eliquis

## 2021-03-16 NOTE — Progress Notes (Signed)
Patient ambulated in room to the bathroom, pulse ox 90-97% on room air while ambulating.   Pulse Oximetry while resting in bed: 97%  Pulse Oximetry while ambulating in room: 90-97%

## 2021-03-16 NOTE — Assessment & Plan Note (Signed)
Continue Synthroid °

## 2021-03-16 NOTE — Hospital Course (Addendum)
Amy Hickman is a 85 y.o. female with a history of COPD celiac, GERD, hypertension recent of lower extremity, OSA not on CPAP, CAD, CKD. IVC filter, aortic valve stenosis, GERD. Patient presented secondary to cough and was found to have influenza infection with evidence of sepsis and COPD exacerbation. While admitted, patient also developed new onset atrial fibrillation with RVR, now on a Cardizem drip. Cardiology consulted and are planning Transthoracic Echocardiogram/Cardioversion.

## 2021-03-16 NOTE — Assessment & Plan Note (Addendum)
-  Continue metoprolol. Diltiazem PO held while on Cardizem IV

## 2021-03-16 NOTE — Assessment & Plan Note (Signed)
-  Tamiflu

## 2021-03-17 DIAGNOSIS — I4891 Unspecified atrial fibrillation: Secondary | ICD-10-CM

## 2021-03-17 DIAGNOSIS — I5022 Chronic systolic (congestive) heart failure: Secondary | ICD-10-CM

## 2021-03-17 DIAGNOSIS — I517 Cardiomegaly: Secondary | ICD-10-CM

## 2021-03-17 DIAGNOSIS — J101 Influenza due to other identified influenza virus with other respiratory manifestations: Secondary | ICD-10-CM

## 2021-03-17 DIAGNOSIS — R079 Chest pain, unspecified: Secondary | ICD-10-CM

## 2021-03-17 DIAGNOSIS — Z86718 Personal history of other venous thrombosis and embolism: Secondary | ICD-10-CM

## 2021-03-17 DIAGNOSIS — Z8719 Personal history of other diseases of the digestive system: Secondary | ICD-10-CM | POA: Diagnosis not present

## 2021-03-17 LAB — CBC
HCT: 34 % — ABNORMAL LOW (ref 36.0–46.0)
Hemoglobin: 10.6 g/dL — ABNORMAL LOW (ref 12.0–15.0)
MCH: 26.6 pg (ref 26.0–34.0)
MCHC: 31.2 g/dL (ref 30.0–36.0)
MCV: 85.4 fL (ref 80.0–100.0)
Platelets: 211 10*3/uL (ref 150–400)
RBC: 3.98 MIL/uL (ref 3.87–5.11)
RDW: 17.7 % — ABNORMAL HIGH (ref 11.5–15.5)
WBC: 4.2 10*3/uL (ref 4.0–10.5)
nRBC: 0 % (ref 0.0–0.2)

## 2021-03-17 LAB — BASIC METABOLIC PANEL
Anion gap: 8 (ref 5–15)
BUN: 22 mg/dL (ref 8–23)
CO2: 23 mmol/L (ref 22–32)
Calcium: 9.3 mg/dL (ref 8.9–10.3)
Chloride: 104 mmol/L (ref 98–111)
Creatinine, Ser: 0.98 mg/dL (ref 0.44–1.00)
GFR, Estimated: 58 mL/min — ABNORMAL LOW (ref >60)
Glucose, Bld: 179 mg/dL — ABNORMAL HIGH (ref 70–99)
Potassium: 4.5 mmol/L (ref 3.5–5.1)
Sodium: 135 mmol/L (ref 135–145)

## 2021-03-17 LAB — TROPONIN I (HIGH SENSITIVITY): Troponin I (High Sensitivity): 11 ng/L (ref <18)

## 2021-03-17 MED ORDER — METOPROLOL TARTRATE 25 MG PO TABS
25.0000 mg | ORAL_TABLET | Freq: Four times a day (QID) | ORAL | Status: DC
  Administered 2021-03-17 – 2021-03-19 (×7): 25 mg via ORAL
  Filled 2021-03-17 (×7): qty 1

## 2021-03-17 MED ORDER — SODIUM CHLORIDE 0.9 % IV SOLN: INTRAVENOUS | Status: DC

## 2021-03-17 MED ORDER — PANTOPRAZOLE SODIUM 40 MG PO TBEC
40.0000 mg | DELAYED_RELEASE_TABLET | Freq: Two times a day (BID) | ORAL | Status: DC
  Administered 2021-03-17 – 2021-03-21 (×8): 40 mg via ORAL
  Filled 2021-03-17 (×8): qty 1

## 2021-03-17 NOTE — Assessment & Plan Note (Addendum)
In setting of RVR. Described as pressure. Some improvement with treatment of RVR. Troponin negative. Resolved.

## 2021-03-17 NOTE — Progress Notes (Signed)
PROGRESS NOTE    Amy Hickman  BHA:[id number] DOB: 1935/12/10 DOA: 03/15/2021 PCP: Drosinis, Pamalee Leyden, PA-C   Brief Narrative: Amy Hickman is a 85 y.o. female with a history of COPD celiac, GERD, hypertension recent of lower extremity, OSA not on CPAP, CAD, CKD. IVC filter, aortic valve stenosis, GERD. Patient presented secondary to cough and was found to have influenza infection with evidence of sepsis and COPD exacerbation. While admitted, patient also developed new onset atrial fibrillation with RVR, now on a Cardizem drip. Cardiology consulted.   Assessment & Plan:   * Sepsis ([place]) Present on admission. Secondary to Influenza infection. Blood cultures obtained on admission and are pending.  Atrial fibrillation with RVR ([place]) Per chart review, patient has a history of SVT and is on metoprolol and Cardizem. Atrial fibrillation is new and possibly related to acute illness. Associated RVR but otherwise hemodynamically stable. Discussed with cardiology who recommended starting Cardizem IV infusion -Continue Cardizem/metoprolol -Start Cardizem IV -Transthoracic Echocardiogram pending  Chest pain In setting of RVR. Described as pressure. Some improvement with treatment of RVR. -Troponin -Cardiology recommendations  Biatrial enlargement Moderately dilated left and severely dilated right atria.  Chronic systolic CHF (congestive heart failure) ([place]) Newly diagnosed with EF of 40-45%. Cardiology consulted this admission.  GERD (gastroesophageal reflux disease) -Continue Protonix  Essential hypertension -Continue metoprolol. Diltiazem PO held while on Cardizem IV  Hypothyroidism -Continue Synthroid  Hyponatremia Chronic. Patient with a history of SIADH. Patient is on salt tablets and Lasix as an outpatient -Continue salt tablets/Lasix  Dark stools Fecal occult test negative.  COPD with acute exacerbation ([place]) Secondary to influenza infection. Respiratory status  appears to be improved. -Continue Duonebs, Steroids -Continue Ceftriaxone  Anemia Chronic. Stable.  Influenza A -Tamiflu  Leukopenia-resolved as of 03/17/2021 Secondary to lymphocytopenia. ANC of 1800. Likely related to acute viral illness. Resolved.  History of DVT (deep vein thrombosis) Previously on Coumadin, now s/p IVC filter.     DVT prophylaxis: SCDs Code Status:   Code Status: DNR Family Communication: Son and granddaughter at bedside Disposition Plan: Discharge likely home in 2-3 days pending improvement of atrial fibrillation, blood culture results   Consultants:  Cardiology  Procedures:  None  Antimicrobials: Ceftriaxone Tamiflu    Subjective: Currently feeling chest pressure. She first had this episode with ambulation to the bathroom. Associated tachycardia. Symptoms are improving with management of tachycardia.  Objective: Vitals:   03/17/21 0913 03/17/21 1042 03/17/21 1048 03/17/21 1238  BP:  (!) 158/104  (!) 165/111  Pulse:  (!) 128 (!) 122 (!) 110  Resp:  (!) 26 (!) 25 15  Temp:    98.1 F (36.7 C)  TempSrc:    Oral  SpO2: 97% 96% 97% 97%  Weight:      Height:        Intake/Output Summary (Last 24 hours) at 03/17/2021 1320 Last data filed at 03/17/2021 0101 Gross per 24 hour  Intake 163.53 ml  Output --  Net 163.53 ml    Filed Weights   03/15/21 1442  Weight: 70.3 kg    Examination:  General exam: Appears uncomfortable and in mild acute distress. Conversant Respiratory: Clear to auscultation. Tachypnea. Cardiovascular: S1 & S2 heard, irregular rhythm with fast rate. Gastrointestinal: Abdomen is non-distended, soft and non-tender. No masses felt. Normal bowel sounds heard Neurologic: No focal neurological deficits Musculoskeletal: No calf tenderness Skin: No cyanosis. No new rashes Psychiatry: Alert and oriented. Memory intact. Mood & affect appropriate  Data Reviewed: I have personally reviewed following labs and  imaging studies  CBC Lab Results  Component Value Date   WBC 4.2 03/17/2021   RBC 3.98 03/17/2021   HGB 10.6 (L) 03/17/2021   HCT 34.0 (L) 03/17/2021   MCV 85.4 03/17/2021   MCH 26.6 03/17/2021   PLT 211 03/17/2021   MCHC 31.2 03/17/2021   RDW 17.7 (H) 03/17/2021   LYMPHSABS 0.2 (L) 03/16/2021   MONOABS 0.1 03/16/2021   EOSABS 0.0 03/16/2021   BASOSABS 0.0 09/47/0962     Last metabolic panel Lab Results  Component Value Date   NA 135 03/17/2021   K 4.5 03/17/2021   CL 104 03/17/2021   CO2 23 03/17/2021   BUN 22 03/17/2021   CREATININE 0.98 03/17/2021   GLUCOSE 179 (H) 03/17/2021   GFRNONAA 58 (L) 03/17/2021   GFRAA 54 (L) 01/22/2019   CALCIUM 9.3 03/17/2021   PHOS 2.8 03/16/2021   PROT 6.3 (L) 03/16/2021   ALBUMIN 3.7 03/16/2021   BILITOT 0.8 03/16/2021   ALKPHOS 58 03/16/2021   AST 30 03/16/2021   ALT 22 03/16/2021   ANIONGAP 8 03/17/2021    CBG (last 3)  No results for input(s): GLUCAP in the last 72 hours.   GFR: Estimated Creatinine Clearance: 38.7 mL/min (by C-G formula based on SCr of 0.98 mg/dL).  Coagulation Profile: No results for input(s): INR, PROTIME in the last 168 hours.  Recent Results (from the past 240 hour(s))  Resp Panel by RT-PCR (Flu A&B, Covid) Nasopharyngeal Swab     Status: Abnormal   Collection Time: 03/15/21  3:49 PM   Specimen: Nasopharyngeal Swab; Nasopharyngeal(NP) swabs in vial transport medium  Result Value Ref Range Status   SARS Coronavirus 2 by RT PCR NEGATIVE NEGATIVE Final    Comment: (NOTE) SARS-CoV-2 target nucleic acids are NOT DETECTED.  The SARS-CoV-2 RNA is generally detectable in upper respiratory specimens during the acute phase of infection. The lowest concentration of SARS-CoV-2 viral copies this assay can detect is 138 copies/mL. A negative result does not preclude SARS-Cov-2 infection and should not be used as the sole basis for treatment or other patient management decisions. A negative result may  occur with  improper specimen collection/handling, submission of specimen other than nasopharyngeal swab, presence of viral mutation(s) within the areas targeted by this assay, and inadequate number of viral copies(<138 copies/mL). A negative result must be combined with clinical observations, patient history, and epidemiological information. The expected result is Negative.  Fact Sheet for Patients:  EntrepreneurPulse.com.au  Fact Sheet for Healthcare Providers:  IncredibleEmployment.be  This test is no t yet approved or cleared by the [place] FDA and  has been authorized for detection and/or diagnosis of SARS-CoV-2 by FDA under an Emergency Use Authorization (EUA). This EUA will remain  in effect (meaning this test can be used) for the duration of the COVID-19 declaration under Section 564(b)(1) of the Act, 21 U.S.C.section 360bbb-3(b)(1), unless the authorization is terminated  or revoked sooner.       Influenza A by PCR POSITIVE (A) NEGATIVE Final   Influenza B by PCR NEGATIVE NEGATIVE Final    Comment: (NOTE) The Xpert Xpress SARS-CoV-2/FLU/RSV plus assay is intended as an aid in the diagnosis of influenza from Nasopharyngeal swab specimens and should not be used as a sole basis for treatment. Nasal washings and aspirates are unacceptable for Xpert Xpress SARS-CoV-2/FLU/RSV testing.  Fact Sheet for Patients: EntrepreneurPulse.com.au  Fact Sheet for Healthcare Providers: IncredibleEmployment.be  This test is  not yet approved or cleared by the [place] and has been authorized for detection and/or diagnosis of SARS-CoV-2 by FDA under an Emergency Use Authorization (EUA). This EUA will remain in effect (meaning this test can be used) for the duration of the COVID-19 declaration under Section 564(b)(1) of the Act, 21 U.S.C. section 360bbb-3(b)(1), unless the authorization is terminated  or revoked.  Performed at [facility], [address] [place], [place] [address]   Culture, blood (x 2)     Status: None (Preliminary result)   Collection Time: 03/15/21 11:30 PM   Specimen: BLOOD  Result Value Ref Range Status   Specimen Description   Final    BLOOD RIGHT WRIST Performed at [facility] [address], [place], [place] [address]    Special Requests   Final    BOTTLES DRAWN AEROBIC ONLY Blood Culture adequate volume Performed at [facility] [address], [place], [place] [address]    Culture   Final    NO GROWTH 1 DAY Performed at [facility], [place] [address], [place], [place] [address]    Report Status PENDING  Incomplete  Culture, blood (x 2)     Status: None (Preliminary result)   Collection Time: 03/15/21 11:30 PM   Specimen: BLOOD  Result Value Ref Range Status   Specimen Description   Final    BLOOD BLOOD LEFT FOREARM Performed at [facility] [address], [place], [place] [address]    Special Requests   Final    BOTTLES DRAWN AEROBIC ONLY Blood Culture adequate volume Performed at [facility] [address], [place], [place] [address]    Culture   Final    NO GROWTH 1 DAY Performed at [facility], [place] [address] [place], [place] [address]    Report Status PENDING  Incomplete  MRSA Next Gen by PCR, Nasal     Status: None   Collection Time: 03/16/21 12:56 AM   Specimen: Urine, Clean Catch; Nasal Swab  Result Value Ref Range Status   MRSA by PCR Next Gen NOT DETECTED NOT DETECTED Final    Comment: (NOTE) The GeneXpert MRSA Assay (FDA approved for NASAL specimens only), is one component of a comprehensive MRSA colonization surveillance program. It is not intended to diagnose MRSA infection nor to guide or monitor treatment for MRSA infections. Test performance is not FDA approved in patients less than 36 years old. Performed at [facility], [place] [address], [place],  [address]         Radiology Studies: DG Chest 2 View  Result Date: 03/15/2021 CLINICAL DATA:  Cough for 2 days. EXAM: CHEST - 2 VIEW COMPARISON:  01/06/2021 and older studies. FINDINGS: Cardiac silhouette is mildly enlarged. No mediastinal or hilar masses. No evidence of adenopathy. Linear opacity noted in the left mid lung consistent with atelectasis. Mild opacity in the lung bases also consistent with atelectasis. Remainder of the lungs is clear. No convincing pleural effusion and no pneumothorax. 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 No acute skeletal abnormality. IMPRESSION: 1. No acute cardiopulmonary disease. Mild lung base and left mid lung linear opacities consistent with atelectasis. No convincing pneumonia and no pulmonary edema. Electronically Signed   By: Lajean Manes M.D.   On: 03/15/2021 17:01   ECHOCARDIOGRAM COMPLETE  Result Date: 03/16/2021    ECHOCARDIOGRAM REPORT   Patient Name:   Amy Hickman  Date of Exam: 03/16/2021 Medical Rec #:  [id number]       Height:       60.0 in Accession #:    [id number]      Weight:       155.0 lb Date of Birth:  1936-04-13      BSA:          1.675 m Patient Age:    51 years        BP:           148/108 mmHg Patient Gender: F               HR:           100 bpm. Exam Location:  Inpatient Procedure: 2D Echo, Cardiac Doppler and Color Doppler Indications:    Atrial Fibrillation  History:        Patient has no prior history of Echocardiogram examinations.                 Arrythmias:Atrial Fibrillation. Flu positive, states never in                 atrial fibrillation til now. Followed by [facility]                 cardiologist.  Sonographer:    Merrie Roof RDCS Referring Phys: Rafael Hernandez  1. Left ventricular ejection fraction, by estimation, is 40 to 45%. The left ventricle has mildly decreased function. The left ventricle  demonstrates global hypokinesis. Left ventricular diastolic parameters are indeterminate.  2. Right ventricular systolic function is mildly reduced. The right ventricular size is normal. There is moderately elevated pulmonary artery systolic pressure. The estimated right ventricular systolic pressure is 50.2 mmHg.  3. Left atrial size was moderately dilated.  4. Right atrial size was severely dilated.  5. The mitral valve is normal in structure. Mild to moderate mitral valve regurgitation.  6. Tricuspid valve regurgitation is moderate.  7. The aortic valve is tricuspid. There is severe calcifcation of the aortic valve. Aortic valve regurgitation is mild. Moderate aortic valve stenosis. Gradients consistent with mild AS (MG 58mHg, Vmax 2.2 m/s), but moderate by AVA (1.0cm^2) and DI (0.32). Low SV index, suspect low flow low gradient moderate AS  8. The inferior vena cava is dilated in size with <50% respiratory variability, suggesting right atrial pressure of 15 mmHg. FINDINGS  Left Ventricle: Left ventricular ejection fraction, by estimation, is 40 to 45%. The left ventricle has mildly decreased function. The left ventricle demonstrates global hypokinesis. The left ventricular internal cavity size was normal in size. There is  no left ventricular hypertrophy. Left ventricular diastolic parameters are indeterminate. Right Ventricle: The right ventricular size is normal. Right vetricular wall thickness was not well visualized. Right ventricular systolic function is mildly reduced. There is moderately elevated pulmonary artery systolic pressure. The tricuspid regurgitant velocity is 2.82 m/s, and with an assumed right atrial pressure of 15 mmHg, the estimated right ventricular systolic pressure is 477.4mmHg. Left Atrium: Left atrial size was moderately dilated. Right Atrium: Right atrial size was severely dilated. Pericardium: There is no evidence of pericardial effusion. Mitral Valve: The mitral valve is normal in  structure. Mild to moderate mitral valve regurgitation. Tricuspid Valve: The tricuspid valve is normal in structure. Tricuspid valve regurgitation is moderate. Aortic Valve: The aortic valve is tricuspid. There is severe calcifcation of the aortic valve. Aortic valve regurgitation is mild. Moderate aortic stenosis is present. Aortic  valve mean gradient measures 11.0 mmHg. Aortic valve peak gradient measures 19.2 mmHg. Aortic valve area, by VTI measures 0.99 cm. Pulmonic Valve: The pulmonic valve was not well visualized. Pulmonic valve regurgitation is not visualized. Aorta: The aortic root is normal in size and structure. Venous: The inferior vena cava is dilated in size with less than 50% respiratory variability, suggesting right atrial pressure of 15 mmHg. IAS/Shunts: The interatrial septum was not well visualized.  LEFT VENTRICLE PLAX 2D LVIDd:         4.50 cm LVIDs:         3.30 cm LV PW:         0.90 cm LV IVS:        0.90 cm LVOT diam:     2.00 cm LV SV:         41 LV SV Index:   24 LVOT Area:     3.14 cm  RIGHT VENTRICLE             IVC RV Basal diam:  4.00 cm     IVC diam: 2.70 cm RV S prime:     10.20 cm/s TAPSE (M-mode): 1.1 cm LEFT ATRIUM             Index        RIGHT ATRIUM           Index LA diam:        4.30 cm 2.57 cm/m   RA Area:     33.40 cm LA Vol (A2C):   93.5 ml 55.82 ml/m  RA Volume:   122.00 ml 72.84 ml/m LA Vol (A4C):   66.1 ml 39.46 ml/m LA Biplane Vol: 80.6 ml 48.12 ml/m  AORTIC VALVE AV Area (Vmax):    1.07 cm AV Area (Vmean):   0.98 cm AV Area (VTI):     0.99 cm AV Vmax:           219.00 cm/s AV Vmean:          154.000 cm/s AV VTI:            0.410 m AV Peak Grad:      19.2 mmHg AV Mean Grad:      11.0 mmHg LVOT Vmax:         74.50 cm/s LVOT Vmean:        47.800 cm/s LVOT VTI:          0.129 m LVOT/AV VTI ratio: 0.31  AORTA Ao Root diam: 3.20 cm TRICUSPID VALVE TR Peak grad:   31.8 mmHg TR Vmax:        282.00 cm/s  SHUNTS Systemic VTI:  0.13 m Systemic Diam: 2.00 cm Oswaldo Milian MD Electronically signed by Oswaldo Milian MD Signature Date/Time: 03/16/2021/1:47:48 PM    Final         Scheduled Meds:  dextromethorphan  30 mg Oral BID   furosemide  20 mg Oral Daily   ipratropium-albuterol  3 mL Nebulization TID   latanoprost  1 drop Both Eyes QHS   levothyroxine  75 mcg Oral Daily   metoprolol tartrate  50 mg Oral Daily   oseltamivir  30 mg Oral BID   pantoprazole  40 mg Oral BID   predniSONE  40 mg Oral Q breakfast   sodium chloride  1 g Oral TID   Continuous Infusions:  cefTRIAXone (ROCEPHIN)  IV 1 g (03/17/21 0021)   diltiazem (CARDIZEM) infusion 10 mg/hr (03/17/21 1040)     LOS: 2  days     Cordelia Poche, MD [facility] 03/17/2021, 1:20 PM  If 7PM-7AM, please contact night-coverage [email or web address]

## 2021-03-17 NOTE — Plan of Care (Signed)

## 2021-03-17 NOTE — Progress Notes (Deleted)
PROGRESS NOTE    SYANN CUPPLES  UKG:[id number] DOB: [birth year] DOA: 03/15/2021 PCP: Drosinis, Pamalee Leyden, PA-C   Brief Narrative: WILLER OSORNO is a 85 y.o. female with a history of COPD celiac, GERD, hypertension recent of lower extremity, OSA not on CPAP, CAD, CKD. IVC filter, aortic valve stenosis, GERD. Patient presented secondary to cough and was found to have influenza infection with evidence of sepsis and COPD exacerbation. While admitted, patient also developed new onset atrial fibrillation with RVR, now on a Cardizem drip. Cardiology consulted.   Assessment & Plan:   * Sepsis ([place]) Present on admission. Secondary to Influenza infection. Blood cultures obtained on admission and are pending.  Atrial fibrillation with RVR ([place]) Per chart review, patient has a history of SVT and is on metoprolol and Cardizem. Atrial fibrillation is new and possibly related to acute illness. Associated RVR but otherwise hemodynamically stable. Discussed with cardiology who recommended starting Cardizem IV infusion -Continue Cardizem/metoprolol -Start Cardizem IV -Transthoracic Echocardiogram pending  Chest pain In setting of RVR. Described as pressure. Some improvement with treatment of RVR. -Troponin -Cardiology recommendations  Biatrial enlargement Moderately dilated left and severely dilated right atria.  Chronic systolic CHF (congestive heart failure) ([place]) Newly diagnosed with EF of 40-45%. Cardiology consulted this admission.  GERD (gastroesophageal reflux disease) -Continue Protonix  Essential hypertension -Continue metoprolol. Diltiazem PO held while on Cardizem IV  Hypothyroidism -Continue Synthroid  Hyponatremia Chronic. Patient with a history of SIADH. Patient is on salt tablets and Lasix as an outpatient -Continue salt tablets/Lasix  Dark stools Fecal occult test negative.  COPD with acute exacerbation ([place]) Secondary to influenza infection. Respiratory status  appears to be improved. -Continue Duonebs, Steroids -Continue Ceftriaxone  Anemia Chronic. Stable.  Influenza A -Tamiflu  Leukopenia-resolved as of 03/17/2021 Secondary to lymphocytopenia. ANC of 1800. Likely related to acute viral illness. Resolved.  History of DVT (deep vein thrombosis) Previously on Coumadin, now s/p IVC filter.    DVT prophylaxis: SCDs Code Status:   Code Status: DNR Family Communication: Son and granddaughter at bedside Disposition Plan: Discharge likely home in 2-3 days pending improvement of atrial fibrillation, blood culture results   Consultants:  Cardiology  Procedures:  None  Antimicrobials: Ceftriaxone Tamiflu    Subjective: Currently feeling chest pressure. She first had this episode with ambulation to the bathroom. Associated tachycardia. Symptoms are improving with management of tachycardia.  Objective: Vitals:   03/17/21 0913 03/17/21 1042 03/17/21 1048 03/17/21 1238  BP:  (!) 158/104  (!) 165/111  Pulse:  (!) 128 (!) 122 (!) 110  Resp:  (!) 26 (!) 25 15  Temp:    98.1 F (36.7 C)  TempSrc:    Oral  SpO2: 97% 96% 97% 97%  Weight:      Height:        Intake/Output Summary (Last 24 hours) at 03/17/2021 1302 Last data filed at 03/17/2021 0101 Gross per 24 hour  Intake 163.53 ml  Output --  Net 163.53 ml    Filed Weights   03/15/21 1442  Weight: 70.3 kg    Examination:  General exam: Appears calm and comfortable and in no acute distress. Conversant Respiratory: Clear to auscultation. Respiratory effort normal with no intercostal retractions or use of accessory muscles Cardiovascular: S1 & S2 heard, RRR. No murmurs, rubs, gallops or clicks. No edema Gastrointestinal: Abdomen is non-distended, soft and non-tender. No masses felt. Normal bowel sounds heard Neurologic: No focal neurological deficits Musculoskeletal: No calf tenderness Skin: No cyanosis. No  new rashes Psychiatry: Alert and oriented. Memory intact. Mood &  affect appropriate      Data Reviewed: I have personally reviewed following labs and imaging studies  CBC Lab Results  Component Value Date   WBC 4.2 03/17/2021   RBC 3.98 03/17/2021   HGB 10.6 (L) 03/17/2021   HCT 34.0 (L) 03/17/2021   MCV 85.4 03/17/2021   MCH 26.6 03/17/2021   PLT 211 03/17/2021   MCHC 31.2 03/17/2021   RDW 17.7 (H) 03/17/2021   LYMPHSABS 0.2 (L) 03/16/2021   MONOABS 0.1 03/16/2021   EOSABS 0.0 03/16/2021   BASOSABS 0.0 16/01/9603     Last metabolic panel Lab Results  Component Value Date   NA 135 03/17/2021   K 4.5 03/17/2021   CL 104 03/17/2021   CO2 23 03/17/2021   BUN 22 03/17/2021   CREATININE 0.98 03/17/2021   GLUCOSE 179 (H) 03/17/2021   GFRNONAA 58 (L) 03/17/2021   GFRAA 54 (L) 01/22/2019   CALCIUM 9.3 03/17/2021   PHOS 2.8 03/16/2021   PROT 6.3 (L) 03/16/2021   ALBUMIN 3.7 03/16/2021   BILITOT 0.8 03/16/2021   ALKPHOS 58 03/16/2021   AST 30 03/16/2021   ALT 22 03/16/2021   ANIONGAP 8 03/17/2021    CBG (last 3)  No results for input(s): GLUCAP in the last 72 hours.   GFR: Estimated Creatinine Clearance: 38.7 mL/min (by C-G formula based on SCr of 0.98 mg/dL).  Coagulation Profile: No results for input(s): INR, PROTIME in the last 168 hours.  Recent Results (from the past 240 hour(s))  Resp Panel by RT-PCR (Flu A&B, Covid) Nasopharyngeal Swab     Status: Abnormal   Collection Time: 03/15/21  3:49 PM   Specimen: Nasopharyngeal Swab; Nasopharyngeal(NP) swabs in vial transport medium  Result Value Ref Range Status   SARS Coronavirus 2 by RT PCR NEGATIVE NEGATIVE Final    Comment: (NOTE) SARS-CoV-2 target nucleic acids are NOT DETECTED.  The SARS-CoV-2 RNA is generally detectable in upper respiratory specimens during the acute phase of infection. The lowest concentration of SARS-CoV-2 viral copies this assay can detect is 138 copies/mL. A negative result does not preclude SARS-Cov-2 infection and should not be used as the  sole basis for treatment or other patient management decisions. A negative result may occur with  improper specimen collection/handling, submission of specimen other than nasopharyngeal swab, presence of viral mutation(s) within the areas targeted by this assay, and inadequate number of viral copies(<138 copies/mL). A negative result must be combined with clinical observations, patient history, and epidemiological information. The expected result is Negative.  Fact Sheet for Patients:  EntrepreneurPulse.com.au  Fact Sheet for Healthcare Providers:  IncredibleEmployment.be  This test is no t yet approved or cleared by the [place] FDA and  has been authorized for detection and/or diagnosis of SARS-CoV-2 by FDA under an Emergency Use Authorization (EUA). This EUA will remain  in effect (meaning this test can be used) for the duration of the COVID-19 declaration under Section 564(b)(1) of the Act, 21 U.S.C.section 360bbb-3(b)(1), unless the authorization is terminated  or revoked sooner.       Influenza A by PCR POSITIVE (A) NEGATIVE Final   Influenza B by PCR NEGATIVE NEGATIVE Final    Comment: (NOTE) The Xpert Xpress SARS-CoV-2/FLU/RSV plus assay is intended as an aid in the diagnosis of influenza from Nasopharyngeal swab specimens and should not be used as a sole basis for treatment. Nasal washings and aspirates are unacceptable for Xpert Xpress SARS-CoV-2/FLU/RSV testing.  Fact Sheet for Patients: EntrepreneurPulse.com.au  Fact Sheet for Healthcare Providers: IncredibleEmployment.be  This test is not yet approved or cleared by the [place] FDA and has been authorized for detection and/or diagnosis of SARS-CoV-2 by FDA under an Emergency Use Authorization (EUA). This EUA will remain in effect (meaning this test can be used) for the duration of the COVID-19 declaration under Section 564(b)(1) of  the Act, 21 U.S.C. section 360bbb-3(b)(1), unless the authorization is terminated or revoked.  Performed at [facility], [place] [place], [place] [address]   Culture, blood (x 2)     Status: None (Preliminary result)   Collection Time: 03/15/21 11:30 PM   Specimen: BLOOD  Result Value Ref Range Status   Specimen Description   Final    BLOOD RIGHT WRIST Performed at [facility] [address] [place], [place] [address]    Special Requests   Final    BOTTLES DRAWN AEROBIC ONLY Blood Culture adequate volume Performed at [facility] [address] [place], [place] [address]    Culture   Final    NO GROWTH 1 DAY Performed at [facility], [place] [address], [place], [place] [address]    Report Status PENDING  Incomplete  Culture, blood (x 2)     Status: None (Preliminary result)   Collection Time: 03/15/21 11:30 PM   Specimen: BLOOD  Result Value Ref Range Status   Specimen Description   Final    BLOOD BLOOD LEFT FOREARM Performed at [facility] [address], [place], [place] [address]    Special Requests   Final    BOTTLES DRAWN AEROBIC ONLY Blood Culture adequate volume Performed at [facility] [address], [place], [place] [address]    Culture   Final    NO GROWTH 1 DAY Performed at [facility], [place] [address] [place], [place] [address]    Report Status PENDING  Incomplete  MRSA Next Gen by PCR, Nasal     Status: None   Collection Time: 03/16/21 12:56 AM   Specimen: Urine, Clean Catch; Nasal Swab  Result Value Ref Range Status   MRSA by PCR Next Gen NOT DETECTED NOT DETECTED Final    Comment: (NOTE) The GeneXpert MRSA Assay (FDA approved for NASAL specimens only), is one component of a comprehensive MRSA colonization surveillance program. It is not intended to diagnose MRSA infection nor to guide or monitor treatment for MRSA infections. Test  performance is not FDA approved in patients less than 64 years old. Performed at [facility], [place] [address] [place], [place] [address]         Radiology Studies: DG Chest 2 View  Result Date: 03/15/2021 CLINICAL DATA:  Cough for 2 days. EXAM: CHEST - 2 VIEW COMPARISON:  01/06/2021 and older studies. FINDINGS: Cardiac silhouette is mildly enlarged. No mediastinal or hilar masses. No evidence of adenopathy. Linear opacity noted in the left mid lung consistent with atelectasis. Mild opacity in the lung bases also consistent with atelectasis. Remainder of the lungs is clear. No convincing pleural effusion and no pneumothorax. 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 No acute skeletal abnormality. IMPRESSION: 1. No acute cardiopulmonary disease. Mild lung base and left mid lung linear opacities consistent with atelectasis. No convincing pneumonia and no pulmonary edema. Electronically Signed   By: Lajean Manes M.D.   On: 03/15/2021 17:01   ECHOCARDIOGRAM COMPLETE  Result  Date: 03/16/2021    ECHOCARDIOGRAM REPORT   Patient Name:   NALIYAH NETH Date of Exam: 03/16/2021 Medical Rec #:  [id number]       Height:       60.0 in Accession #:    [id number]      Weight:       155.0 lb Date of Birth:  [birth year]      BSA:          1.675 m Patient Age:    46 years        BP:           148/108 mmHg Patient Gender: F               HR:           100 bpm. Exam Location:  Inpatient Procedure: 2D Echo, Cardiac Doppler and Color Doppler Indications:    Atrial Fibrillation  History:        Patient has no prior history of Echocardiogram examinations.                 Arrythmias:Atrial Fibrillation. Flu positive, states never in                 atrial fibrillation til now. Followed by [facility]                 cardiologist.  Sonographer:    Merrie Roof RDCS Referring Phys: [place]  1. Left ventricular ejection fraction, by estimation, is  40 to 45%. The left ventricle has mildly decreased function. The left ventricle demonstrates global hypokinesis. Left ventricular diastolic parameters are indeterminate.  2. Right ventricular systolic function is mildly reduced. The right ventricular size is normal. There is moderately elevated pulmonary artery systolic pressure. The estimated right ventricular systolic pressure is 22.9 mmHg.  3. Left atrial size was moderately dilated.  4. Right atrial size was severely dilated.  5. The mitral valve is normal in structure. Mild to moderate mitral valve regurgitation.  6. Tricuspid valve regurgitation is moderate.  7. The aortic valve is tricuspid. There is severe calcifcation of the aortic valve. Aortic valve regurgitation is mild. Moderate aortic valve stenosis. Gradients consistent with mild AS (MG 35mHg, Vmax 2.2 m/s), but moderate by AVA (1.0cm^2) and DI (0.32). Low SV index, suspect low flow low gradient moderate AS  8. The inferior vena cava is dilated in size with <50% respiratory variability, suggesting right atrial pressure of 15 mmHg. FINDINGS  Left Ventricle: Left ventricular ejection fraction, by estimation, is 40 to 45%. The left ventricle has mildly decreased function. The left ventricle demonstrates global hypokinesis. The left ventricular internal cavity size was normal in size. There is  no left ventricular hypertrophy. Left ventricular diastolic parameters are indeterminate. Right Ventricle: The right ventricular size is normal. Right vetricular wall thickness was not well visualized. Right ventricular systolic function is mildly reduced. There is moderately elevated pulmonary artery systolic pressure. The tricuspid regurgitant velocity is 2.82 m/s, and with an assumed right atrial pressure of 15 mmHg, the estimated right ventricular systolic pressure is 479.8mmHg. Left Atrium: Left atrial size was moderately dilated. Right Atrium: Right atrial size was severely dilated. Pericardium: There is no  evidence of pericardial effusion. Mitral Valve: The mitral valve is normal in structure. Mild to moderate mitral valve regurgitation. Tricuspid Valve: The tricuspid valve is normal in structure. Tricuspid valve regurgitation is moderate. Aortic Valve: The aortic valve is tricuspid. There is severe  calcifcation of the aortic valve. Aortic valve regurgitation is mild. Moderate aortic stenosis is present. Aortic valve mean gradient measures 11.0 mmHg. Aortic valve peak gradient measures 19.2 mmHg. Aortic valve area, by VTI measures 0.99 cm. Pulmonic Valve: The pulmonic valve was not well visualized. Pulmonic valve regurgitation is not visualized. Aorta: The aortic root is normal in size and structure. Venous: The inferior vena cava is dilated in size with less than 50% respiratory variability, suggesting right atrial pressure of 15 mmHg. IAS/Shunts: The interatrial septum was not well visualized.  LEFT VENTRICLE PLAX 2D LVIDd:         4.50 cm LVIDs:         3.30 cm LV PW:         0.90 cm LV IVS:        0.90 cm LVOT diam:     2.00 cm LV SV:         41 LV SV Index:   24 LVOT Area:     3.14 cm  RIGHT VENTRICLE             IVC RV Basal diam:  4.00 cm     IVC diam: 2.70 cm RV S prime:     10.20 cm/s TAPSE (M-mode): 1.1 cm LEFT ATRIUM             Index        RIGHT ATRIUM           Index LA diam:        4.30 cm 2.57 cm/m   RA Area:     33.40 cm LA Vol (A2C):   93.5 ml 55.82 ml/m  RA Volume:   122.00 ml 72.84 ml/m LA Vol (A4C):   66.1 ml 39.46 ml/m LA Biplane Vol: 80.6 ml 48.12 ml/m  AORTIC VALVE AV Area (Vmax):    1.07 cm AV Area (Vmean):   0.98 cm AV Area (VTI):     0.99 cm AV Vmax:           219.00 cm/s AV Vmean:          154.000 cm/s AV VTI:            0.410 m AV Peak Grad:      19.2 mmHg AV Mean Grad:      11.0 mmHg LVOT Vmax:         74.50 cm/s LVOT Vmean:        47.800 cm/s LVOT VTI:          0.129 m LVOT/AV VTI ratio: 0.31  AORTA Ao Root diam: 3.20 cm TRICUSPID VALVE TR Peak grad:   31.8 mmHg TR Vmax:         282.00 cm/s  SHUNTS Systemic VTI:  0.13 m Systemic Diam: 2.00 cm Oswaldo Milian MD Electronically signed by Oswaldo Milian MD Signature Date/Time: 03/16/2021/1:47:48 PM    Final         Scheduled Meds:  dextromethorphan  30 mg Oral BID   furosemide  20 mg Oral Daily   ipratropium-albuterol  3 mL Nebulization TID   latanoprost  1 drop Both Eyes QHS   levothyroxine  75 mcg Oral Daily   metoprolol tartrate  50 mg Oral Daily   oseltamivir  30 mg Oral BID   pantoprazole  40 mg Oral BID   predniSONE  40 mg Oral Q breakfast   sodium chloride  1 g Oral TID   Continuous Infusions:  cefTRIAXone (ROCEPHIN)  IV 1 g (03/17/21  0021)   diltiazem (CARDIZEM) infusion 10 mg/hr (03/17/21 1040)     LOS: 2 days     Cordelia Poche, MD [facility] 03/17/2021, 1:02 PM  If 7PM-7AM, please contact night-coverage [email or web address]

## 2021-03-17 NOTE — Consult Note (Addendum)
Cardiology Consultation:   Patient ID: Amy Hickman MRN: [id number]; DOB: 23-Apr-1935  Admit date: 03/15/2021 Date of Consult: 03/17/2021  PCP:  Drosinis, Pamalee Leyden, PA-C   [facility] Providers Cardiologist:  None  new here Dr Otho Perl in [facility], and his PA  Patient Profile:   Amy Hickman is a 85 y.o. female with a hx of SVT, HTN, HLD, HOH, GERD, ADH, DVT/PE s/p IVC, intol coumadin 2nd internal bleeding w/ no source identified, CKD IV, Crohn's dz, hypothyroid, cath 2022 w/ mod CAD but no PCI, COPD, UTIs, mild AS, who is being seen 03/17/2021 for the evaluation of Atrial fib, RVR, at the request of Dr Lonny Prude.  History of Present Illness:   Amy Hickman was admitted 11/26 with cough, SOB, weak. +influenza A, +sepsis, started on nebs and steroids. Pt in Afib RVR on admit, Cards asked to see.   Pt says PA was concerned about her valve and she had a R/L heart cath 07/2020 >> med rx, AS mean gradient 8, PCWP 23.  She has never had Afib before. She has had episodes of a rapid HR associated w/ SOB and chest pain, getting light-headed. These started in the last 3-4 years. Cards notes mention SVT in 2018. She will get them every 5-6 weeks. They can start at rest or with any level of exertion. Has even had them when laying in bed. She did not tell Cardiology or her PCP recently.  She is not aware of the atrial fib, and feels that her heart is back in rhythm.   She had pericarditis in the past. It is mentioned in 2018 Cards notes.  She has chest pain. It comes with the palps and resolves when her HR slows. She started having it today, it is well-localized w/ point tenderness and increases w/ deep inspiration.  She does not have a hx exertional CP. Her son says she stays busy all the time, does not sit still.   She sees a Nephrologist because she does not hold on to salt. She takes a salt tab daily.    Past Medical History:  Diagnosis Date   Celiac disease    GERD (gastroesophageal reflux  disease)    HOH (hard of hearing)    Hypertension    Interstitial cystitis     Past Surgical History:  Procedure Laterality Date   ABDOMINAL HYSTERECTOMY     APPENDECTOMY     CHOLECYSTECTOMY     ear drum surgery     REPLACEMENT TOTAL KNEE     ROTATOR CUFF REPAIR Right    TONSILLECTOMY       Home Medications:  Prior to Admission medications   Medication Sig Start Date End Date Taking? Authorizing Provider  acetaminophen (TYLENOL) 500 MG tablet Take 1 tablet (500 mg total) by mouth every 6 (six) hours as needed for pain. 06/24/12  Yes [name], MD  aspirin 81 MG EC tablet Take 81 mg by mouth daily. 08/09/20 08/09/21 Yes [provider]  atorvastatin (LIPITOR) 10 MG tablet Take 10 mg by mouth every evening. 02/28/21  Yes [provider]  B Complex Vitamins (VITAMIN B COMPLEX) TABS Take 1 tablet by mouth daily.   Yes [provider]  benzonatate (TESSALON PERLES) 100 MG capsule Take 1 capsule (100 mg total) by mouth 3 (three) times daily as needed for cough. Patient taking differently: Take 200 mg by mouth 3 (three) times daily as needed for cough. 11/18/13  Yes [name], MD  clidinium-chlordiazePOXIDE Juluis Pitch)  5-2.5 MG capsule Take 1 capsule by mouth daily as needed. cramping 02/28/21  Yes [provider]  co-enzyme Q-10 30 MG capsule Take 30 mg by mouth daily.   Yes [provider]  colchicine 0.6 MG tablet Take 0.6 mg by mouth daily as needed (for gout flareup). 03/10/21  Yes [provider]  Cranberry 400 MG CAPS Take 400 mg by mouth daily.   Yes [provider]  diltiazem (TIAZAC) 120 MG 24 hr capsule Take 120 mg by mouth daily.   Yes [provider]  doxazosin (CARDURA) 4 MG tablet Take 4 mg by mouth at bedtime. 02/01/21  Yes [provider]  esomeprazole (NEXIUM) 40 MG capsule Take 40 mg by mouth daily before breakfast.   Yes [provider]  estradiol (ESTRACE) 0.1 MG/GM vaginal  cream Place 1 Applicatorful vaginally once a week. 09/24/20  Yes [provider]  furosemide (LASIX) 40 MG tablet Take 20 mg by mouth daily.   Yes [provider]  latanoprost (XALATAN) 0.005 % ophthalmic solution 1 drop at bedtime. 03/11/21  Yes [provider]  levothyroxine (SYNTHROID) 75 MCG tablet Take 75 mcg by mouth daily.    Yes [provider]  metoprolol tartrate (LOPRESSOR) 50 MG tablet Take 50 mg by mouth 2 (two) times daily.   Yes [provider]  ondansetron (ZOFRAN) 4 MG tablet Take 1 tablet (4 mg total) by mouth every 6 (six) hours. Patient taking differently: Take 4 mg by mouth every 8 (eight) hours as needed for nausea. 07/29/16  Yes Mabe, Forbes Cellar, MD  pentosan polysulfate (ELMIRON) 100 MG capsule Take 100 mg by mouth 3 (three) times daily before meals.   Yes [provider]  pilocarpine (PILOCAR) 1 % ophthalmic solution Place 1 drop into the left eye 3 (three) times daily. 02/13/21  Yes [provider]  sodium chloride (OCEAN) 0.65 % SOLN nasal spray Place 1 spray into both nostrils as needed for congestion.   Yes [provider]  sodium chloride 1 G tablet Take 1 g by mouth 3 (three) times daily.   Yes [provider]  Turmeric 500 MG CAPS Take 500 mg by mouth daily.   Yes [provider]  Grant Ruts INHUB 250-50 MCG/ACT AEPB Inhale 2 puffs into the lungs at bedtime. 02/15/21  Yes [provider]  doxycycline (VIBRAMYCIN) 100 MG capsule Take 100 mg by mouth 2 (two) times daily. Patient not taking: Reported on 03/16/2021 03/05/21   [provider]  fenofibrate 54 MG tablet Take 54 mg by mouth daily. Patient not taking: Reported on 03/16/2021 03/09/21   [provider]  pravastatin (PRAVACHOL) 20 MG tablet Take 20 mg by mouth daily. Patient not taking: Reported on 03/16/2021    [provider]  Travoprost, BAK Free, (TRAVATAN) 0.004 % SOLN ophthalmic solution 1  drop at bedtime. Patient not taking: Reported on 03/16/2021    [provider]    Inpatient Medications: Scheduled Meds:  dextromethorphan  30 mg Oral BID   furosemide  20 mg Oral Daily   ipratropium-albuterol  3 mL Nebulization TID   latanoprost  1 drop Both Eyes QHS   levothyroxine  75 mcg Oral Daily   metoprolol tartrate  50 mg Oral Daily   oseltamivir  30 mg Oral BID   pantoprazole  40 mg Oral BID   predniSONE  40 mg Oral Q breakfast   sodium chloride  1 g Oral TID   Continuous Infusions:  cefTRIAXone (  ROCEPHIN)  IV 1 g (03/17/21 0021)   diltiazem (CARDIZEM) infusion 10 mg/hr (03/17/21 1040)   PRN Meds: acetaminophen **OR** acetaminophen, albuterol, benzonatate  Allergies:    Allergies  Allergen Reactions   Ativan [Lorazepam] Other (See Comments)    "Knocked her out, woke up 3 days later"   Augmentin [Amoxicillin-Pot Clavulanate] Nausea And Vomiting   Avelox [Moxifloxacin] Itching and Nausea Only   Chlorzoxazone Other (See Comments)    Went crazy/loopy   Ciprofloxacin Hcl Itching and Nausea Only   Crestor [Rosuvastatin] Other (See Comments)    myalgia   Gluten Meal Other (See Comments)    Diarrhea, hot/cold sweat, will sleep for a couple of hours - Celiac disease   Nitrofuran Derivatives Itching and Nausea Only   Phenergan [Promethazine Hcl] Other (See Comments)    hallucinations   Codeine Palpitations   Darvon [Propoxyphene Hcl] Palpitations   Doxycycline Itching and Nausea Only    Social History:   Social History   Socioeconomic History   Marital status: Widowed    Spouse name: Not on file   Number of children: Not on file   Years of education: Not on file   Highest education level: Not on file  Occupational History   Not on file  Tobacco Use   Smoking status: Former    Types: Cigarettes   Smokeless tobacco: Never  Substance and Sexual Activity   Alcohol use: Yes    Comment: rare   Drug use: No   Sexual activity: Not on file  Other  Topics Concern   Not on file  Social History Narrative   Not on file   Social Determinants of Health   Financial Resource Strain: Not on file  Food Insecurity: Not on file  Transportation Needs: Not on file  Physical Activity: Not on file  Stress: Not on file  Social Connections: Not on file  Intimate Partner Violence: Not on file    Family History:   Family History  Problem Relation Age of Onset   Hypertension Other      ROS:  Please see the history of present illness.  All other ROS reviewed and negative.     Physical Exam/Data:   Vitals:   03/17/21 1042 03/17/21 1048 03/17/21 1238 03/17/21 1443  BP: (!) 158/104  (!) 165/111   Pulse: (!) 128 (!) 122 (!) 110   Resp: (!) 26 (!) 25 15   Temp:   98.1 F (36.7 C)   TempSrc:   Oral   SpO2: 96% 97% 97% 95%  Weight:      Height:        Intake/Output Summary (Last 24 hours) at 03/17/2021 1444 Last data filed at 03/17/2021 0101 Gross per 24 hour  Intake 163.53 ml  Output --  Net 163.53 ml   Last 3 Weights 03/15/2021 01/22/2019 07/29/2016  Weight (lbs) 155 lb 149 lb 148 lb  Weight (kg) 70.308 kg 67.586 kg 67.132 kg     Body mass index is 30.27 kg/m.  General:  Well nourished, well developed, elderly female in no acute distress HEENT: normal Neck: JVD 10 cm Vascular: No carotid bruits; Distal pulses 2+ bilaterally Cardiac:  normal S1, S2; irreg R&R; ?soft murmur Lungs:  +wheeze, +rhonchi, few rales  Abd: soft, nontender, no hepatomegaly  Ext: no edema Musculoskeletal:  No deformities, BUE and BLE strength weak but equal Skin: warm and dry  Neuro:  CNs 2-12 intact, no focal abnormalities noted Psych:  Normal affect   EKG:  The EKG was personally reviewed and demonstrates:  11/26 ECG is atrial fib, RVR, HR 106 Telemetry:  Telemetry was personally reviewed and demonstrates:  atrial fib, HR generally about 100  Relevant CV Studies:  ECHO: 03/16/2021  1. Left ventricular ejection fraction, by estimation, is 40  to 45%. The  left ventricle has mildly decreased function. The left ventricle  demonstrates global hypokinesis. Left ventricular diastolic parameters are indeterminate.   2. Right ventricular systolic function is mildly reduced. The right  ventricular size is normal. There is moderately elevated pulmonary artery systolic pressure. The estimated right ventricular systolic pressure is 46.9 mmHg.   3. Left atrial size was moderately dilated.   4. Right atrial size was severely dilated.   5. The mitral valve is normal in structure. Mild to moderate mitral valve  regurgitation.   6. Tricuspid valve regurgitation is moderate.   7. The aortic valve is tricuspid. There is severe calcifcation of the  aortic valve. Aortic valve regurgitation is mild. Moderate aortic valve  stenosis. Gradients consistent with mild AS (MG 42mHg, Vmax 2.2 m/s), but  moderate by AVA (1.0cm^2) and DI  (0.32). Low SV index, suspect low flow low gradient moderate AS   8. The inferior vena cava is dilated in size with <50% respiratory  variability, suggesting right atrial pressure of 15 mmHg.   CARDIAC CATH: 08/09/2020 DIAGNOSTIC FINDINGS:    1.  Mild aortic stenosis.  Mean gradient 8 mmHg.  2.  Borderline coronary artery stenosis involving the LAD and first  diagonal.  The IFR was measured in both the LAD and the diagonal  separately.  The IFR was unremarkable, consistent with no impairment of  coronary blood flow to the segments.  Therefore, coronary artery  intervention was not currently indicated.  3.  Mild pulmonary hypertension, accompanied by mildly elevated right  atrial and left atrial filling pressures.  This is likely manifestation of  salt/volume overload.  4.  Normal cardiac output.  RECOMMENDATIONS:   The evaluations for aortic stenosis show that her aortic stenosis is  mild.  Valve replacement is not currently indicated.  Annual  echocardiography is recommended for monitoring.   Given the patient's  borderline coronary artery stenoses, and aggressive   approach to CVD risk reduction is recommended.  These areas may eventually  worsen.  The patient has failed several lipid-lowering therapies in the  past due to side effects, etc.  We will try atorvastatin.  She can  follow-up with her primary cardiologist to see if this is working.   A low-salt diet and overall salt restriction was recommended.  The  patient reportedly takes salt tablets.  She did should discuss this with  the prescribing physician, because measurements in the Cath Lab today are  consistent with salt/volume overload.   Regular exercise was recommended.  I think that regular exercise is the  intervention that is most likely to improve this patient's longevity and  quality of life.  Pressures    RA 13 mmHg/11 mmHg/10 mmHg  RV 46 mmHg/4 mmHg/RVEDP: 12 mmHg  PA 48 mmHg/18 mmHg/33 mmHg  PCW 27 mmHg/31 mmHg/23 mmHg  LA  / /    LV 152 mmHg/23 mmHg/11 mmHg  AO 144 mmHg/75 mmHg/105 mmHg  BP 149 mmHg/71 mmHg/117 mmHg    Saturations    IVC Saturation O2-Sat    SVC Saturation O2-Sat    RA Saturation O2-Sat 63.8 %  RV Saturation O2-Sat    PA Saturation O2-Sat 52.9 %  PCW Saturation  O2-Sat    AO Saturation O2-Sat 90.9 %    Derived Parameters    PVR    PVR Index    SVR    SVR Index    TD Aortic Flow 155.1  Thermal TSVR 21.5  Thermal SVR 19.22  Thermal TPVR 7.88  Thermal PVR 2.55  Thermal TPVR TSVR 0.37  Thermal PVR SVR 0.13  TD Stroke Volume  38.05    Cardiac Output    Fick A-V O2 Diff    Fick O2 Consumption 151.67  Fick Predicted O2 Consumption 151.67  Fick CO 2.53 L/min  Fick CI 1.5  Thermo CO 4.22 L/min  Thermo CI 2.5    Ventricular Data    LV Max DP/DT    LV Max DP/DT/P    RV Max DP/DT    RV Max DP/DT/P    Coronary Findings Diagnostic Dominance: Right  Left Anterior Descending: Ost LAD to Prox LAD lesion is 25% stenosed. Mid LAD lesion is 60% stenosed. First Diagonal Branch: 1st Diag lesion  is 60% stenosed.  Second Obtuse Marginal Branch: 2nd Mrg lesion is 20% stenosed.  Right Coronary Artery: Prox RCA to Mid RCA lesion is 40% stenosed.    ECHO: 07/12/2020  SUMMARY  The left ventricular size is normal.  There is normal left ventricular wall thickness.  Left ventricular systolic function is low normal.  LV ejection fraction = 50-55%.  The left ventricular wall motion is normal.  Left ventricular filling pattern is indeterminate.  The right ventricle is normal in size and function.  The aortic valve is heavily calcified with restricted opening.  Visually, there is at least moderate aortic stenosis. The calculated  valve area is 0.96 cm2. Peak velocity across the aortic valve is 2.2  m/s. The mean gradient is 10 mmHg. These gradients are Hickman  underestimation of the severity of the aortic stenosis.  There is mild aortic regurgitation.  The inferior vena cava is mildly dilated with a decrease in  inspiratory collapse.  There is no comparison study available.   Laboratory Data:  High Sensitivity Troponin:   Recent Labs  Lab 03/15/21 2330 03/16/21 0149 03/17/21 1324  TROPONINIHS 8 8 11      Chemistry Recent Labs  Lab 03/15/21 1549 03/15/21 2330 03/16/21 0149 03/17/21 0426  NA 133*  --  130* 135  K 4.3  --  4.5 4.5  CL 102  --  100 104  CO2 22  --  20* 23  GLUCOSE 107*  --  182* 179*  BUN 15  --  14 22  CREATININE 1.01*  --  0.96 0.98  CALCIUM 9.4  --  9.1 9.3  MG  --  1.9 1.9  --   GFRNONAA 56*  --  59* 58*  ANIONGAP 9  --  10 8    Recent Labs  Lab 03/15/21 1549 03/16/21 0149  PROT 6.4* 6.3*  ALBUMIN 3.8 3.7  AST 31 30  ALT 23 22  ALKPHOS 57 58  BILITOT 0.3 0.8   Lipids No results for input(s): CHOL, TRIG, HDL, LABVLDL, LDLCALC, CHOLHDL in the last 168 hours.  Hematology Recent Labs  Lab 03/15/21 1549 03/15/21 2330 03/16/21 0149 03/17/21 0426  WBC 3.3*  --  2.1* 4.2  RBC 4.21 4.11 3.97 3.98  HGB 11.0*  --  10.6* 10.6*  HCT 36.8  --   34.5* 34.0*  MCV 87.4  --  86.9 85.4  MCH 26.1  --  26.7 26.6  MCHC 29.9*  --  30.7 31.2  RDW 17.6*  --  17.5* 17.7*  PLT 197  --  194 211   Thyroid  Recent Labs  Lab 03/16/21 0149  TSH 0.589    BNPNo results for input(s): BNP, PROBNP in the last 168 hours.  DDimer No results for input(s): DDIMER in the last 168 hours.   Radiology/Studies:  DG Chest 2 View  Result Date: 03/15/2021 CLINICAL DATA:  Cough for 2 days. EXAM: CHEST - 2 VIEW COMPARISON:  01/06/2021 and older studies. FINDINGS: Cardiac silhouette is mildly enlarged. No mediastinal or hilar masses. No evidence of adenopathy. Linear opacity noted in the left mid lung consistent with atelectasis. Mild opacity in the lung bases also consistent with atelectasis. Remainder of the lungs is clear. No convincing pleural effusion and no pneumothorax. 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 No acute skeletal abnormality. IMPRESSION: 1. No acute cardiopulmonary disease. Mild lung base and left mid lung linear opacities consistent with atelectasis. No convincing pneumonia and no pulmonary edema. Electronically Signed   By: Lajean Manes M.D.   On: 03/15/2021 17:01   ECHOCARDIOGRAM COMPLETE  Result Date: 03/16/2021    ECHOCARDIOGRAM REPORT   Patient Name:   Amy Hickman Date of Exam: 03/16/2021 Medical Rec #:  [id number]       Height:       60.0 in Accession #:    [id number]      Weight:       155.0 lb Date of Birth:  October 11, 1935      BSA:          1.675 m Patient Age:    24 years        BP:           148/108 mmHg Patient Gender: F               HR:           100 bpm. Exam Location:  Inpatient Procedure: 2D Echo, Cardiac Doppler and Color Doppler Indications:    Atrial Fibrillation  History:        Patient has no prior history of Echocardiogram examinations.                 Arrythmias:Atrial Fibrillation. Flu positive, states never in                 atrial fibrillation til now. Followed by [place]                  cardiologist.  Sonographer:    Merrie Roof RDCS Referring Phys: [place]  1. Left ventricular ejection fraction, by estimation, is 40 to 45%. The left ventricle has mildly decreased function. The left ventricle demonstrates global hypokinesis. Left ventricular diastolic parameters are indeterminate.  2. Right ventricular systolic function is mildly reduced. The right ventricular size is normal. There is moderately elevated pulmonary artery systolic pressure. The estimated right ventricular systolic pressure is 19.5 mmHg.  3. Left atrial size was moderately dilated.  4. Right atrial size was severely dilated.  5. The mitral valve is normal in structure. Mild to moderate mitral valve regurgitation.  6. Tricuspid valve regurgitation is moderate.  7. The aortic valve is tricuspid. There is severe calcifcation of the aortic valve. Aortic valve regurgitation is mild. Moderate aortic valve stenosis. Gradients consistent with mild AS (MG 53mHg, Vmax 2.2 m/s), but moderate by AVA (1.0cm^2) and DI (0.32). Low SV index, suspect low flow low gradient moderate AS  8. The inferior vena cava is dilated in size with <50% respiratory variability, suggesting right atrial pressure of 15 mmHg. FINDINGS  Left Ventricle: Left ventricular ejection fraction, by estimation, is 40 to 45%. The left ventricle has mildly decreased function. The left ventricle demonstrates global hypokinesis. The left ventricular internal cavity size was normal in size. There is  no left ventricular hypertrophy. Left ventricular diastolic parameters are indeterminate. Right Ventricle: The right ventricular size is normal. Right vetricular wall thickness was not well visualized. Right ventricular systolic function is mildly reduced. There is moderately elevated pulmonary artery systolic pressure. The tricuspid regurgitant velocity is 2.82 m/s, and with Hickman assumed right atrial pressure of 15 mmHg, the estimated right  ventricular systolic pressure is 73.7 mmHg. Left Atrium: Left atrial size was moderately dilated. Right Atrium: Right atrial size was severely dilated. Pericardium: There is no evidence of pericardial effusion. Mitral Valve: The mitral valve is normal in structure. Mild to moderate mitral valve regurgitation. Tricuspid Valve: The tricuspid valve is normal in structure. Tricuspid valve regurgitation is moderate. Aortic Valve: The aortic valve is tricuspid. There is severe calcifcation of the aortic valve. Aortic valve regurgitation is mild. Moderate aortic stenosis is present. Aortic valve mean gradient measures 11.0 mmHg. Aortic valve peak gradient measures 19.2 mmHg. Aortic valve area, by VTI measures 0.99 cm. Pulmonic Valve: The pulmonic valve was not well visualized. Pulmonic valve regurgitation is not visualized. Aorta: The aortic root is normal in size and structure. Venous: The inferior vena cava is dilated in size with less than 50% respiratory variability, suggesting right atrial pressure of 15 mmHg. IAS/Shunts: The interatrial septum was not well visualized.  LEFT VENTRICLE PLAX 2D LVIDd:         4.50 cm LVIDs:         3.30 cm LV PW:         0.90 cm LV IVS:        0.90 cm LVOT diam:     2.00 cm LV SV:         41 LV SV Index:   24 LVOT Area:     3.14 cm  RIGHT VENTRICLE             IVC RV Basal diam:  4.00 cm     IVC diam: 2.70 cm RV S prime:     10.20 cm/s TAPSE (M-mode): 1.1 cm LEFT ATRIUM             Index        RIGHT ATRIUM           Index LA diam:        4.30 cm 2.57 cm/m   RA Area:     33.40 cm LA Vol (A2C):   93.5 ml 55.82 ml/m  RA Volume:   122.00 ml 72.84 ml/m LA Vol (A4C):   66.1 ml 39.46 ml/m LA Biplane Vol: 80.6 ml 48.12 ml/m  AORTIC VALVE AV Area (Vmax):    1.07 cm AV Area (Vmean):   0.98 cm AV Area (VTI):     0.99 cm AV Vmax:           219.00 cm/s AV Vmean:          154.000 cm/s AV VTI:            0.410 m AV Peak Grad:      19.2 mmHg AV Mean Grad:      11.0 mmHg LVOT Vmax:  74.50 cm/s LVOT Vmean:        47.800 cm/s LVOT VTI:          0.129 m LVOT/AV VTI ratio: 0.31  AORTA Ao Root diam: 3.20 cm TRICUSPID VALVE TR Peak grad:   31.8 mmHg TR Vmax:        282.00 cm/s  SHUNTS Systemic VTI:  0.13 m Systemic Diam: 2.00 cm Oswaldo Milian MD Electronically signed by Oswaldo Milian MD Signature Date/Time: 03/16/2021/1:47:48 PM    Final      Assessment and Plan:   Persistent atrial fib - rate reasonable at about 100, in the setting of acute illness - continue Cardizem IV for now, currently 15 mg/hr change to po once HR/BP stable - pta was on Lopressor 50 mg bid, this was continued - pta was on Dilt CT 120 mg qd >> d/c'd - currently not on anticoag at all, pt reported dark stools, but FOB neg - H&H 11/36.8 on admit, similar to 2020 numbers, slight decrease since admit - had GIB 2015 w/ H&H 6.9/21.6, was on coumadin for DVT/PE, sent to [facility], anticoag d/c'd and IVC placed - discuss options w/ MD - could f/u as outpt and consider DCCV once pt recovers, but if cannot anticoagulate, cannot do.  - consider [facility]  2. Chest pain - she has point tenderness on her chest wall, lower L sternal edge - has also had w/ tachypalpitations - cardiac ez are negative - no WMA on echo w/ EF 40/45%, prev 50-55% 07/2020 echo - cath 07/2020 w/ non-obs CAD - MD advise on eval  3. LVD - EF 50-55% >> 40-45% from 07/2020 to now - likely tachycardia mediated cardiomyopathy - mild JVD and some rales - I/O are incomplete, suspect she has had more in than out - likely NICM in the setting of acute illness and poorly controlled BP - Recheck CXR and BNP in am, may need diuresis  4.  Flu - on ABX, steroids and Tamilflu - per IM   Risk Assessment/Risk Scores:     HEAR Score (for undifferentiated chest pain):  HEAR Score: 4   CHA2DS2-VASc Score = 5   This indicates a 7.2% annual risk of stroke. The patient's score is based upon: CHF History: 0 HTN History: 1 Diabetes History:  0 Stroke History: 0 Vascular Disease History: 1 Age Score: 2 Gender Score: 1   For questions or updates, please contact [place] Please consult [email or web address] for contact info under    Signed, Rosaria Ferries, PA-C  03/17/2021 2:44 PM

## 2021-03-17 NOTE — TOC Progression Note (Signed)
Transition of Care [facility]) - Progression Note    Patient Details  Name: Amy Hickman MRN: [id number] Date of Birth: 25-Feb-1936  [facility] CM/SW Contact  Purcell Mouton, RN Phone Number: 03/17/2021, 4:06 PM  Clinical Narrative:     Pt from home. Chart reviewed. TOC will continue to follow for discharge needs.   Expected Discharge Plan: [place] Barriers to Discharge: No Barriers Identified  Expected Discharge Plan and Services Expected Discharge Plan: [place] arrangements for the past 2 months: Single Family Home                                       Social Determinants of Health (SDOH) Interventions    Readmission Risk Interventions No flowsheet data found.

## 2021-03-17 NOTE — Assessment & Plan Note (Signed)
Moderately dilated left and severely dilated right atria.

## 2021-03-17 NOTE — Assessment & Plan Note (Addendum)
Newly diagnosed with EF of 40-45%. Cardiology consulted this admission. 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

## 2021-03-18 ENCOUNTER — Other Ambulatory Visit (HOSPITAL_COMMUNITY): Payer: Medicare Other

## 2021-03-18 ENCOUNTER — Encounter (HOSPITAL_COMMUNITY)
Admission: EM | Disposition: A | Discharge status: Home Health | Payer: Self-pay | Source: Home / Self Care | Attending: Family Medicine

## 2021-03-18 ENCOUNTER — Inpatient Hospital Stay (HOSPITAL_COMMUNITY): Payer: Medicare Other

## 2021-03-18 DIAGNOSIS — J101 Influenza due to other identified influenza virus with other respiratory manifestations: Secondary | ICD-10-CM | POA: Diagnosis not present

## 2021-03-18 DIAGNOSIS — R062 Wheezing: Secondary | ICD-10-CM | POA: Diagnosis not present

## 2021-03-18 DIAGNOSIS — I4891 Unspecified atrial fibrillation: Secondary | ICD-10-CM | POA: Diagnosis not present

## 2021-03-18 LAB — BRAIN NATRIURETIC PEPTIDE: B Natriuretic Peptide: 653.4 pg/mL — ABNORMAL HIGH (ref 0.0–100.0)

## 2021-03-18 SURGERY — ECHOCARDIOGRAM, TRANSESOPHAGEAL
Anesthesia: Monitor Anesthesia Care

## 2021-03-18 MED ORDER — DILTIAZEM HCL ER COATED BEADS 180 MG PO CP24
360.0000 mg | ORAL_CAPSULE | Freq: Every day | ORAL | Status: DC
  Administered 2021-03-18 – 2021-03-21 (×4): 360 mg via ORAL
  Filled 2021-03-18 (×4): qty 2

## 2021-03-18 MED ORDER — LIP MEDEX EX OINT
TOPICAL_OINTMENT | CUTANEOUS | Status: AC
  Administered 2021-03-18: 1 via TOPICAL
  Filled 2021-03-18: qty 7

## 2021-03-18 MED ORDER — IPRATROPIUM-ALBUTEROL 0.5-2.5 (3) MG/3ML IN SOLN
3.0000 mL | Freq: Two times a day (BID) | RESPIRATORY_TRACT | Status: DC
Start: 2021-03-19 — End: 2021-03-20
  Administered 2021-03-19 – 2021-03-20 (×4): 3 mL via RESPIRATORY_TRACT
  Filled 2021-03-18 (×4): qty 3

## 2021-03-18 MED ORDER — DILTIAZEM HCL ER COATED BEADS 120 MG PO CP24
ORAL_CAPSULE | ORAL | Status: AC
  Filled 2021-03-18: qty 1

## 2021-03-18 MED ORDER — IPRATROPIUM-ALBUTEROL 0.5-2.5 (3) MG/3ML IN SOLN
3.0000 mL | Freq: Four times a day (QID) | RESPIRATORY_TRACT | Status: DC
  Administered 2021-03-18: 3 mL via RESPIRATORY_TRACT

## 2021-03-18 MED ORDER — ALBUTEROL SULFATE (2.5 MG/3ML) 0.083% IN NEBU: 2.5000 mg | INHALATION_SOLUTION | Freq: Four times a day (QID) | RESPIRATORY_TRACT | Status: DC | PRN

## 2021-03-18 MED ORDER — DILTIAZEM HCL ER COATED BEADS 240 MG PO CP24
ORAL_CAPSULE | ORAL | Status: AC
  Filled 2021-03-18: qty 1

## 2021-03-18 MED ORDER — IPRATROPIUM-ALBUTEROL 0.5-2.5 (3) MG/3ML IN SOLN: 3.0000 mL | Freq: Four times a day (QID) | RESPIRATORY_TRACT | Status: DC

## 2021-03-18 MED ORDER — APIXABAN 5 MG PO TABS
ORAL_TABLET | ORAL | Status: AC
  Administered 2021-03-18: 5 mg via ORAL
  Filled 2021-03-18: qty 1

## 2021-03-18 MED ORDER — LIP MEDEX EX OINT: 1.0000 "application " | TOPICAL_OINTMENT | CUTANEOUS | Status: DC | PRN

## 2021-03-18 MED ORDER — IPRATROPIUM-ALBUTEROL 0.5-2.5 (3) MG/3ML IN SOLN
3.0000 mL | RESPIRATORY_TRACT | Status: DC
  Administered 2021-03-18: 3 mL via RESPIRATORY_TRACT

## 2021-03-18 MED ORDER — APIXABAN 5 MG PO TABS
5.0000 mg | ORAL_TABLET | Freq: Two times a day (BID) | ORAL | Status: DC
  Administered 2021-03-18 – 2021-03-21 (×6): 5 mg via ORAL
  Filled 2021-03-18: qty 1
  Filled 2021-03-18: qty 2
  Filled 2021-03-18 (×4): qty 1

## 2021-03-18 NOTE — Progress Notes (Signed)
PROGRESS NOTE    Amy Hickman  YIA:[id number] DOB: 1936/01/05 DOA: 03/15/2021 PCP: Drosinis, Pamalee Leyden, PA-C   Brief Narrative: Amy Hickman is a 85 y.o. female with a history of COPD celiac, GERD, hypertension recent of lower extremity, OSA not on CPAP, CAD, CKD. IVC filter, aortic valve stenosis, GERD. Patient presented secondary to cough and was found to have influenza infection with evidence of sepsis and COPD exacerbation. While admitted, patient also developed new onset atrial fibrillation with RVR, now on a Cardizem drip. Cardiology consulted and are planning Transthoracic Echocardiogram/Cardioversion.   Assessment & Plan:   * Sepsis ([place]) Present on admission. Secondary to Influenza infection. Blood cultures obtained on admission and are pending.  Atrial fibrillation with RVR ([name]) Per chart review, patient has a history of SVT and is on metoprolol and Cardizem. Atrial fibrillation is new and possibly related to acute illness. Associated RVR but otherwise hemodynamically stable. Discussed with cardiology who recommended starting Cardizem IV infusion -Continue metoprolol -Continue Cardizem IV -Cardiology recommendations: Transesophageal Echocardiogram with cardioversion planned for today, 11/29. Consideration of starting Eliquis  Biatrial enlargement Moderately dilated left and severely dilated right atria.  Chronic systolic CHF (congestive heart failure) ([name]) Newly diagnosed with EF of 40-45%. Cardiology consulted this admission. 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  GERD (gastroesophageal reflux disease) -Continue Protonix  Essential hypertension -Continue metoprolol. Diltiazem PO held while on Cardizem IV  Hypothyroidism -Continue Synthroid  Hyponatremia Chronic. Patient with a history of SIADH. Patient is on salt tablets and Lasix as an outpatient -Continue salt tablets/Lasix  Dark stools Fecal occult test negative.  COPD with acute exacerbation  ([name]) Secondary to influenza infection. Respiratory status appears to be improved however persistent wheezing -Continue Duonebs but switch to q4 hours while awake for the next two days followed by QID. Continues steroids -Continue Ceftriaxone  Anemia Chronic. Stable.  Influenza A -Tamiflu  Chest pain-resolved as of 03/18/2021 In setting of RVR. Described as pressure. Some improvement with treatment of RVR. Troponin negative. Resolved.  Leukopenia-resolved as of 03/17/2021 Secondary to lymphocytopenia. ANC of 1800. Likely related to acute viral illness. Resolved.  History of DVT (deep vein thrombosis) Previously on Coumadin, now s/p IVC filter.     DVT prophylaxis: SCDs Code Status:   Code Status: DNR Family Communication: Son and granddaughter at bedside Disposition Plan: Discharge likely home in 2-3 days pending improvement of atrial fibrillation, blood culture results   Consultants:  Cardiology  Procedures:  None  Antimicrobials: Ceftriaxone Tamiflu    Subjective: Feeling better today. Wheezing.  Objective: Vitals:   03/18/21 0405 03/18/21 0455 03/18/21 0459 03/18/21 0837  BP: (!) 162/101 (!) 155/86    Pulse: 95 88 98   Resp: 20 19 20    Temp: (!) 97.5 F (36.4 C)     TempSrc: Oral     SpO2: 97% 92% 95% 96%  Weight:      Height:        Intake/Output Summary (Last 24 hours) at 03/18/2021 1027 Last data filed at 03/18/2021 0554 Gross per 24 hour  Intake 508.51 ml  Output --  Net 508.51 ml    Filed Weights   03/15/21 1442  Weight: 70.3 kg    Examination:  General exam: Appears calm and comfortable and in no acute distress. Conversant Respiratory: Bilateral wheezing and rhonchi. Respiratory effort normal with no intercostal retractions or use of accessory muscles Cardiovascular: S1 & S2 heard, Irregular rhythm with normal rate. No murmurs, rubs, gallops or clicks. No edema Gastrointestinal: Abdomen  is non-distended, soft and non-tender. No masses  felt. Normal bowel sounds heard Neurologic: No focal neurological deficits Musculoskeletal: No calf tenderness Skin: No cyanosis. No new rashes Psychiatry: Alert and oriented. Memory intact. Mood & affect appropriate    Data Reviewed: I have personally reviewed following labs and imaging studies  CBC Lab Results  Component Value Date   WBC 4.2 03/17/2021   RBC 3.98 03/17/2021   HGB 10.6 (L) 03/17/2021   HCT 34.0 (L) 03/17/2021   MCV 85.4 03/17/2021   MCH 26.6 03/17/2021   PLT 211 03/17/2021   MCHC 31.2 03/17/2021   RDW 17.7 (H) 03/17/2021   LYMPHSABS 0.2 (L) 03/16/2021   MONOABS 0.1 03/16/2021   EOSABS 0.0 03/16/2021   BASOSABS 0.0 16/01/9603     Last metabolic panel Lab Results  Component Value Date   NA 135 03/17/2021   K 4.5 03/17/2021   CL 104 03/17/2021   CO2 23 03/17/2021   BUN 22 03/17/2021   CREATININE 0.98 03/17/2021   GLUCOSE 179 (H) 03/17/2021   GFRNONAA 58 (L) 03/17/2021   GFRAA 54 (L) 01/22/2019   CALCIUM 9.3 03/17/2021   PHOS 2.8 03/16/2021   PROT 6.3 (L) 03/16/2021   ALBUMIN 3.7 03/16/2021   BILITOT 0.8 03/16/2021   ALKPHOS 58 03/16/2021   AST 30 03/16/2021   ALT 22 03/16/2021   ANIONGAP 8 03/17/2021    CBG (last 3)  No results for input(s): GLUCAP in the last 72 hours.   GFR: Estimated Creatinine Clearance: 38.7 mL/min (by C-G formula based on SCr of 0.98 mg/dL).  Coagulation Profile: No results for input(s): INR, PROTIME in the last 168 hours.  Recent Results (from the past 240 hour(s))  Resp Panel by RT-PCR (Flu A&B, Covid) Nasopharyngeal Swab     Status: Abnormal   Collection Time: 03/15/21  3:49 PM   Specimen: Nasopharyngeal Swab; Nasopharyngeal(NP) swabs in vial transport medium  Result Value Ref Range Status   SARS Coronavirus 2 by RT PCR NEGATIVE NEGATIVE Final    Comment: (NOTE) SARS-CoV-2 target nucleic acids are NOT DETECTED.  The SARS-CoV-2 RNA is generally detectable in upper respiratory specimens during the acute  phase of infection. The lowest concentration of SARS-CoV-2 viral copies this assay can detect is 138 copies/mL. A negative result does not preclude SARS-Cov-2 infection and should not be used as the sole basis for treatment or other patient management decisions. A negative result may occur with  improper specimen collection/handling, submission of specimen other than nasopharyngeal swab, presence of viral mutation(s) within the areas targeted by this assay, and inadequate number of viral copies(<138 copies/mL). A negative result must be combined with clinical observations, patient history, and epidemiological information. The expected result is Negative.  Fact Sheet for Patients:  EntrepreneurPulse.com.au  Fact Sheet for Healthcare Providers:  IncredibleEmployment.be  This test is no t yet approved or cleared by the [place] FDA and  has been authorized for detection and/or diagnosis of SARS-CoV-2 by FDA under an Emergency Use Authorization (EUA). This EUA will remain  in effect (meaning this test can be used) for the duration of the COVID-19 declaration under Section 564(b)(1) of the Act, 21 U.S.C.section 360bbb-3(b)(1), unless the authorization is terminated  or revoked sooner.       Influenza A by PCR POSITIVE (A) NEGATIVE Final   Influenza B by PCR NEGATIVE NEGATIVE Final    Comment: (NOTE) The Xpert Xpress SARS-CoV-2/FLU/RSV plus assay is intended as an aid in the diagnosis of influenza from Nasopharyngeal swab  specimens and should not be used as a sole basis for treatment. Nasal washings and aspirates are unacceptable for Xpert Xpress SARS-CoV-2/FLU/RSV testing.  Fact Sheet for Patients: EntrepreneurPulse.com.au  Fact Sheet for Healthcare Providers: IncredibleEmployment.be  This test is not yet approved or cleared by the [place] FDA and has been authorized for detection and/or diagnosis  of SARS-CoV-2 by FDA under an Emergency Use Authorization (EUA). This EUA will remain in effect (meaning this test can be used) for the duration of the COVID-19 declaration under Section 564(b)(1) of the Act, 21 U.S.C. section 360bbb-3(b)(1), unless the authorization is terminated or revoked.  Performed at [facility], [address] [place], [place] [address]   Culture, blood (x 2)     Status: None (Preliminary result)   Collection Time: 03/15/21 11:30 PM   Specimen: BLOOD  Result Value Ref Range Status   Specimen Description   Final    BLOOD RIGHT WRIST Performed at [facility] [address], [place], [place] [address]    Special Requests   Final    BOTTLES DRAWN AEROBIC ONLY Blood Culture adequate volume Performed at [facility] [address] [place], [place] [address]    Culture   Final    NO GROWTH 2 DAYS Performed at [facility] [address] [place], [place] [address]    Report Status PENDING  Incomplete  Culture, blood (x 2)     Status: None (Preliminary result)   Collection Time: 03/15/21 11:30 PM   Specimen: BLOOD  Result Value Ref Range Status   Specimen Description   Final    BLOOD BLOOD LEFT FOREARM Performed at [facility] [address], [place], [place] [address]    Special Requests   Final    BOTTLES DRAWN AEROBIC ONLY Blood Culture adequate volume Performed at [facility] [address] [place], [place] [address]    Culture   Final    NO GROWTH 2 DAYS Performed at [facility] [address], [place], [place] [address]    Report Status PENDING  Incomplete  MRSA Next Gen by PCR, Nasal     Status: None   Collection Time: 03/16/21 12:56 AM   Specimen: Urine, Clean Catch; Nasal Swab  Result Value Ref Range Status   MRSA by PCR Next Gen NOT DETECTED NOT DETECTED Final    Comment: (NOTE) The GeneXpert MRSA Assay (FDA approved for NASAL  specimens only), is one component of a comprehensive MRSA colonization surveillance program. It is not intended to diagnose MRSA infection nor to guide or monitor treatment for MRSA infections. Test performance is not FDA approved in patients less than 27 years old. Performed at [facility], [place] [address], [place], [place] [address]         Radiology Studies: ECHOCARDIOGRAM COMPLETE  Result Date: 03/16/2021    ECHOCARDIOGRAM REPORT   Patient Name:   MAYGAN KOELLER Date of Exam: 03/16/2021 Medical Rec #:  [id number]       Height:       60.0 in Accession #:    [id number]      Weight:       155.0 lb Date of Birth:  [birth year]      BSA:          1.675 m Patient Age:    71 years        BP:  148/108 mmHg Patient Gender: F               HR:           100 bpm. Exam Location:  Inpatient Procedure: 2D Echo, Cardiac Doppler and Color Doppler Indications:    Atrial Fibrillation  History:        Patient has no prior history of Echocardiogram examinations.                 Arrythmias:Atrial Fibrillation. Flu positive, states never in                 atrial fibrillation til now. Followed by [facility]                 cardiologist.  Sonographer:    Merrie Roof RDCS Referring Phys: [place]  1. Left ventricular ejection fraction, by estimation, is 40 to 45%. The left ventricle has mildly decreased function. The left ventricle demonstrates global hypokinesis. Left ventricular diastolic parameters are indeterminate.  2. Right ventricular systolic function is mildly reduced. The right ventricular size is normal. There is moderately elevated pulmonary artery systolic pressure. The estimated right ventricular systolic pressure is 62.8 mmHg.  3. Left atrial size was moderately dilated.  4. Right atrial size was severely dilated.  5. The mitral valve is normal in structure. Mild to moderate mitral valve regurgitation.  6. Tricuspid valve regurgitation is  moderate.  7. The aortic valve is tricuspid. There is severe calcifcation of the aortic valve. Aortic valve regurgitation is mild. Moderate aortic valve stenosis. Gradients consistent with mild AS (MG 44mHg, Vmax 2.2 m/s), but moderate by AVA (1.0cm^2) and DI (0.32). Low SV index, suspect low flow low gradient moderate AS  8. The inferior vena cava is dilated in size with <50% respiratory variability, suggesting right atrial pressure of 15 mmHg. FINDINGS  Left Ventricle: Left ventricular ejection fraction, by estimation, is 40 to 45%. The left ventricle has mildly decreased function. The left ventricle demonstrates global hypokinesis. The left ventricular internal cavity size was normal in size. There is  no left ventricular hypertrophy. Left ventricular diastolic parameters are indeterminate. Right Ventricle: The right ventricular size is normal. Right vetricular wall thickness was not well visualized. Right ventricular systolic function is mildly reduced. There is moderately elevated pulmonary artery systolic pressure. The tricuspid regurgitant velocity is 2.82 m/s, and with an assumed right atrial pressure of 15 mmHg, the estimated right ventricular systolic pressure is 436.6mmHg. Left Atrium: Left atrial size was moderately dilated. Right Atrium: Right atrial size was severely dilated. Pericardium: There is no evidence of pericardial effusion. Mitral Valve: The mitral valve is normal in structure. Mild to moderate mitral valve regurgitation. Tricuspid Valve: The tricuspid valve is normal in structure. Tricuspid valve regurgitation is moderate. Aortic Valve: The aortic valve is tricuspid. There is severe calcifcation of the aortic valve. Aortic valve regurgitation is mild. Moderate aortic stenosis is present. Aortic valve mean gradient measures 11.0 mmHg. Aortic valve peak gradient measures 19.2 mmHg. Aortic valve area, by VTI measures 0.99 cm. Pulmonic Valve: The pulmonic valve was not well visualized.  Pulmonic valve regurgitation is not visualized. Aorta: The aortic root is normal in size and structure. Venous: The inferior vena cava is dilated in size with less than 50% respiratory variability, suggesting right atrial pressure of 15 mmHg. IAS/Shunts: The interatrial septum was not well visualized.  LEFT VENTRICLE PLAX 2D LVIDd:         4.50  cm LVIDs:         3.30 cm LV PW:         0.90 cm LV IVS:        0.90 cm LVOT diam:     2.00 cm LV SV:         41 LV SV Index:   24 LVOT Area:     3.14 cm  RIGHT VENTRICLE             IVC RV Basal diam:  4.00 cm     IVC diam: 2.70 cm RV S prime:     10.20 cm/s TAPSE (M-mode): 1.1 cm LEFT ATRIUM             Index        RIGHT ATRIUM           Index LA diam:        4.30 cm 2.57 cm/m   RA Area:     33.40 cm LA Vol (A2C):   93.5 ml 55.82 ml/m  RA Volume:   122.00 ml 72.84 ml/m LA Vol (A4C):   66.1 ml 39.46 ml/m LA Biplane Vol: 80.6 ml 48.12 ml/m  AORTIC VALVE AV Area (Vmax):    1.07 cm AV Area (Vmean):   0.98 cm AV Area (VTI):     0.99 cm AV Vmax:           219.00 cm/s AV Vmean:          154.000 cm/s AV VTI:            0.410 m AV Peak Grad:      19.2 mmHg AV Mean Grad:      11.0 mmHg LVOT Vmax:         74.50 cm/s LVOT Vmean:        47.800 cm/s LVOT VTI:          0.129 m LVOT/AV VTI ratio: 0.31  AORTA Ao Root diam: 3.20 cm TRICUSPID VALVE TR Peak grad:   31.8 mmHg TR Vmax:        282.00 cm/s  SHUNTS Systemic VTI:  0.13 m Systemic Diam: 2.00 cm Oswaldo Milian MD Electronically signed by Oswaldo Milian MD Signature Date/Time: 03/16/2021/1:47:48 PM    Final         Scheduled Meds:  dextromethorphan  30 mg Oral BID   furosemide  20 mg Oral Daily   ipratropium-albuterol  3 mL Nebulization Q4H while awake   Followed by   [START ON 03/20/2021] ipratropium-albuterol  3 mL Nebulization QID   latanoprost  1 drop Both Eyes QHS   levothyroxine  75 mcg Oral Daily   metoprolol tartrate  25 mg Oral Q6H   oseltamivir  30 mg Oral BID   pantoprazole  40 mg Oral  BID   predniSONE  40 mg Oral Q breakfast   sodium chloride  1 g Oral TID   Continuous Infusions:  sodium chloride 20 mL/hr at 03/18/21 0458   cefTRIAXone (ROCEPHIN)  IV 1 g (03/17/21 2354)   diltiazem (CARDIZEM) infusion 15 mg/hr (03/18/21 0710)     LOS: 3 days     Cordelia Poche, MD [facility] 03/18/2021, 10:27 AM  If 7PM-7AM, please contact night-coverage [email or web address]

## 2021-03-18 NOTE — Progress Notes (Signed)
Progress Note  Patient Name: Amy Hickman Date of Encounter: 03/18/2021  [facility] Cardiologist: Buford Dresser, MD new  Subjective   Feeling better but not back to baseline Willing to try anticoag w/ Eliquis, does not want coumadin again No real awareness of the Afib  Inpatient Medications    Scheduled Meds:  dextromethorphan  30 mg Oral BID   furosemide  20 mg Oral Daily   ipratropium-albuterol  3 mL Nebulization Q4H while awake   Followed by   [START ON 03/20/2021] ipratropium-albuterol  3 mL Nebulization QID   latanoprost  1 drop Both Eyes QHS   levothyroxine  75 mcg Oral Daily   metoprolol tartrate  25 mg Oral Q6H   oseltamivir  30 mg Oral BID   pantoprazole  40 mg Oral BID   predniSONE  40 mg Oral Q breakfast   sodium chloride  1 g Oral TID   Continuous Infusions:  sodium chloride 20 mL/hr at 03/18/21 0458   cefTRIAXone (ROCEPHIN)  IV 1 g (03/17/21 2354)   diltiazem (CARDIZEM) infusion 15 mg/hr (03/18/21 0710)   PRN Meds: acetaminophen **OR** acetaminophen, albuterol, benzonatate   Vital Signs    Vitals:   03/18/21 0405 03/18/21 0455 03/18/21 0459 03/18/21 0837  BP: (!) 162/101 (!) 155/86    Pulse: 95 88 98   Resp: 20 19 20    Temp: (!) 97.5 F (36.4 C)     TempSrc: Oral     SpO2: 97% 92% 95% 96%  Weight:      Height:        Intake/Output Summary (Last 24 hours) at 03/18/2021 1122 Last data filed at 03/18/2021 0554 Gross per 24 hour  Intake 508.51 ml  Output --  Net 508.51 ml   Last 3 Weights 03/15/2021 01/22/2019 07/29/2016  Weight (lbs) 155 lb 149 lb 148 lb  Weight (kg) 70.308 kg 67.586 kg 67.132 kg      Telemetry    Atrial fib, rate ok, 4 bt run NSVT - Personally Reviewed  ECG    None today - Personally Reviewed  Physical Exam   GEN: No acute distress.   Neck: JVD 9 cm Cardiac: Irreg R&R, ?soft murmur, no rubs, or gallops.  Respiratory: decreased BS w/ wheeze and some rhonchi bilaterally. GI: Soft, nontender,  non-distended  MS: No edema; No deformity. Neuro:  Nonfocal  Psych: Normal affect   Labs    High Sensitivity Troponin:   Recent Labs  Lab 03/15/21 2330 03/16/21 0149 03/17/21 1324  TROPONINIHS 8 8 11      Chemistry Recent Labs  Lab 03/15/21 1549 03/15/21 2330 03/16/21 0149 03/17/21 0426  NA 133*  --  130* 135  K 4.3  --  4.5 4.5  CL 102  --  100 104  CO2 22  --  20* 23  GLUCOSE 107*  --  182* 179*  BUN 15  --  14 22  CREATININE 1.01*  --  0.96 0.98  CALCIUM 9.4  --  9.1 9.3  MG  --  1.9 1.9  --   PROT 6.4*  --  6.3*  --   ALBUMIN 3.8  --  3.7  --   AST 31  --  30  --   ALT 23  --  22  --   ALKPHOS 57  --  58  --   BILITOT 0.3  --  0.8  --   GFRNONAA 56*  --  59* 58*  ANIONGAP 9  --  10 8    Lipids No results for input(s): CHOL, TRIG, HDL, LABVLDL, LDLCALC, CHOLHDL in the last 168 hours.  Hematology Recent Labs  Lab 03/15/21 1549 03/15/21 2330 03/16/21 0149 03/17/21 0426  WBC 3.3*  --  2.1* 4.2  RBC 4.21 4.11 3.97 3.98  HGB 11.0*  --  10.6* 10.6*  HCT 36.8  --  34.5* 34.0*  MCV 87.4  --  86.9 85.4  MCH 26.1  --  26.7 26.6  MCHC 29.9*  --  30.7 31.2  RDW 17.6*  --  17.5* 17.7*  PLT 197  --  194 211   Thyroid  Recent Labs  Lab 03/16/21 0149  TSH 0.589    BNP Recent Labs  Lab 03/18/21 0409  BNP 653.4*    DDimer No results for input(s): DDIMER in the last 168 hours.   Radiology    ECHOCARDIOGRAM COMPLETE  Result Date: 03/16/2021    ECHOCARDIOGRAM REPORT   Patient Name:   Amy Hickman Date of Exam: 03/16/2021 Medical Rec #:  [id number]       Height:       60.0 in Accession #:    [id number]      Weight:       155.0 lb Date of Birth:  [birth year]      BSA:          1.675 m Patient Age:    85 years        BP:           148/108 mmHg Patient Gender: F               HR:           100 bpm. Exam Location:  Inpatient Procedure: 2D Echo, Cardiac Doppler and Color Doppler Indications:    Atrial Fibrillation  History:        Patient has no prior history of  Echocardiogram examinations.                 Arrythmias:Atrial Fibrillation. Flu positive, states never in                 atrial fibrillation til now. Followed by [facility]                 cardiologist.  Sonographer:    Merrie Roof RDCS Referring Phys: [place]  1. Left ventricular ejection fraction, by estimation, is 40 to 45%. The left ventricle has mildly decreased function. The left ventricle demonstrates global hypokinesis. Left ventricular diastolic parameters are indeterminate.  2. Right ventricular systolic function is mildly reduced. The right ventricular size is normal. There is moderately elevated pulmonary artery systolic pressure. The estimated right ventricular systolic pressure is 44.9 mmHg.  3. Left atrial size was moderately dilated.  4. Right atrial size was severely dilated.  5. The mitral valve is normal in structure. Mild to moderate mitral valve regurgitation.  6. Tricuspid valve regurgitation is moderate.  7. The aortic valve is tricuspid. There is severe calcifcation of the aortic valve. Aortic valve regurgitation is mild. Moderate aortic valve stenosis. Gradients consistent with mild AS (MG 40mHg, Vmax 2.2 m/s), but moderate by AVA (1.0cm^2) and DI (0.32). Low SV index, suspect low flow low gradient moderate AS  8. The inferior vena cava is dilated in size with <50% respiratory variability, suggesting right atrial pressure of 15 mmHg. FINDINGS  Left Ventricle: Left ventricular ejection fraction, by estimation, is 40 to 45%. The left  ventricle has mildly decreased function. The left ventricle demonstrates global hypokinesis. The left ventricular internal cavity size was normal in size. There is  no left ventricular hypertrophy. Left ventricular diastolic parameters are indeterminate. Right Ventricle: The right ventricular size is normal. Right vetricular wall thickness was not well visualized. Right ventricular systolic function is mildly reduced.  There is moderately elevated pulmonary artery systolic pressure. The tricuspid regurgitant velocity is 2.82 m/s, and with an assumed right atrial pressure of 15 mmHg, the estimated right ventricular systolic pressure is 09.9 mmHg. Left Atrium: Left atrial size was moderately dilated. Right Atrium: Right atrial size was severely dilated. Pericardium: There is no evidence of pericardial effusion. Mitral Valve: The mitral valve is normal in structure. Mild to moderate mitral valve regurgitation. Tricuspid Valve: The tricuspid valve is normal in structure. Tricuspid valve regurgitation is moderate. Aortic Valve: The aortic valve is tricuspid. There is severe calcifcation of the aortic valve. Aortic valve regurgitation is mild. Moderate aortic stenosis is present. Aortic valve mean gradient measures 11.0 mmHg. Aortic valve peak gradient measures 19.2 mmHg. Aortic valve area, by VTI measures 0.99 cm. Pulmonic Valve: The pulmonic valve was not well visualized. Pulmonic valve regurgitation is not visualized. Aorta: The aortic root is normal in size and structure. Venous: The inferior vena cava is dilated in size with less than 50% respiratory variability, suggesting right atrial pressure of 15 mmHg. IAS/Shunts: The interatrial septum was not well visualized.  LEFT VENTRICLE PLAX 2D LVIDd:         4.50 cm LVIDs:         3.30 cm LV PW:         0.90 cm LV IVS:        0.90 cm LVOT diam:     2.00 cm LV SV:         41 LV SV Index:   24 LVOT Area:     3.14 cm  RIGHT VENTRICLE             IVC RV Basal diam:  4.00 cm     IVC diam: 2.70 cm RV S prime:     10.20 cm/s TAPSE (M-mode): 1.1 cm LEFT ATRIUM             Index        RIGHT ATRIUM           Index LA diam:        4.30 cm 2.57 cm/m   RA Area:     33.40 cm LA Vol (A2C):   93.5 ml 55.82 ml/m  RA Volume:   122.00 ml 72.84 ml/m LA Vol (A4C):   66.1 ml 39.46 ml/m LA Biplane Vol: 80.6 ml 48.12 ml/m  AORTIC VALVE AV Area (Vmax):    1.07 cm AV Area (Vmean):   0.98 cm AV Area  (VTI):     0.99 cm AV Vmax:           219.00 cm/s AV Vmean:          154.000 cm/s AV VTI:            0.410 m AV Peak Grad:      19.2 mmHg AV Mean Grad:      11.0 mmHg LVOT Vmax:         74.50 cm/s LVOT Vmean:        47.800 cm/s LVOT VTI:          0.129 m LVOT/AV VTI ratio: 0.31  AORTA Ao Root diam:  3.20 cm TRICUSPID VALVE TR Peak grad:   31.8 mmHg TR Vmax:        282.00 cm/s  SHUNTS Systemic VTI:  0.13 m Systemic Diam: 2.00 cm Oswaldo Milian MD Electronically signed by Oswaldo Milian MD Signature Date/Time: 03/16/2021/1:47:48 PM    Final     Cardiac Studies   ECHO: 03/17/2021  1. Left ventricular ejection fraction, by estimation, is 40 to 45%. The  left ventricle has mildly decreased function. The left ventricle  demonstrates global hypokinesis. Left ventricular diastolic parameters are indeterminate.   2. Right ventricular systolic function is mildly reduced. The right  ventricular size is normal. There is moderately elevated pulmonary artery systolic pressure. The estimated right ventricular systolic pressure is 40.8 mmHg.   3. Left atrial size was moderately dilated.   4. Right atrial size was severely dilated.   5. The mitral valve is normal in structure. Mild to moderate mitral valve regurgitation.   6. Tricuspid valve regurgitation is moderate.   7. The aortic valve is tricuspid. There is severe calcifcation of the  aortic valve. Aortic valve regurgitation is mild. Moderate aortic valve  stenosis. Gradients consistent with mild AS (MG 83mHg, Vmax 2.2 m/s), but moderate by AVA (1.0cm^2) and DI  (0.32). Low SV index, suspect low flow low gradient moderate AS   8. The inferior vena cava is dilated in size with <50% respiratory  variability, suggesting right atrial pressure of 15 mmHg.   Patient Profile     85y.o. female  with a hx of SVT, HTN, HLD, HOH, GERD, ADH, DVT/PE s/p IVC, intol coumadin 2nd internal bleeding w/ no source identified, CKD IV, Crohn's dz, hypothyroid,  cath 2022 w/ mod CAD but no PCI, COPD, UTIs, mild AS, was admitted 03/16/2021 for influenza A, sepsis, and Atrial fib, RVR.   Cards asked to see for the Afib.  Assessment & Plan    Persistent atrial fib - HR control improved, likely 2nd improvement in gen med condition - she agrees to aKimberly-Clark- will start Eliquis per pharmacy - could do TEE/DCCV tomorrow pm if can get 2 doses today, 3rd in am and resp ok - Cardizem IV at 15 mg/hr, could change to po, but the dose might be too high once she is in SR  2. Influenza A - still wheezing - not on O2, but sats 94% at rest, would be lower w/ exertion - on nebs and steroids, Tamiflu  - mgt per IM   For questions or updates, please contact [facility] Please consult [email or web address] for contact info under        Signed, RRosaria Ferries PA-C  03/18/2021, 11:22 AM

## 2021-03-19 DIAGNOSIS — J101 Influenza due to other identified influenza virus with other respiratory manifestations: Secondary | ICD-10-CM | POA: Diagnosis not present

## 2021-03-19 DIAGNOSIS — I4891 Unspecified atrial fibrillation: Secondary | ICD-10-CM | POA: Diagnosis not present

## 2021-03-19 LAB — CBC
HCT: 36.7 % (ref 36.0–46.0)
Hemoglobin: 11.5 g/dL — ABNORMAL LOW (ref 12.0–15.0)
MCH: 26.8 pg (ref 26.0–34.0)
MCHC: 31.3 g/dL (ref 30.0–36.0)
MCV: 85.5 fL (ref 80.0–100.0)
Platelets: 234 10*3/uL (ref 150–400)
RBC: 4.29 MIL/uL (ref 3.87–5.11)
RDW: 17.7 % — ABNORMAL HIGH (ref 11.5–15.5)
WBC: 6.9 10*3/uL (ref 4.0–10.5)
nRBC: 0 % (ref 0.0–0.2)

## 2021-03-19 LAB — BASIC METABOLIC PANEL
Anion gap: 10 (ref 5–15)
BUN: 25 mg/dL — ABNORMAL HIGH (ref 8–23)
CO2: 27 mmol/L (ref 22–32)
Calcium: 9.5 mg/dL (ref 8.9–10.3)
Chloride: 99 mmol/L (ref 98–111)
Creatinine, Ser: 1.05 mg/dL — ABNORMAL HIGH (ref 0.44–1.00)
GFR, Estimated: 53 mL/min — ABNORMAL LOW (ref >60)
Glucose, Bld: 107 mg/dL — ABNORMAL HIGH (ref 70–99)
Potassium: 3.7 mmol/L (ref 3.5–5.1)
Sodium: 136 mmol/L (ref 135–145)

## 2021-03-19 LAB — GLUCOSE, CAPILLARY: Glucose-Capillary: 135 mg/dL — ABNORMAL HIGH (ref 70–99)

## 2021-03-19 MED ORDER — MELATONIN 3 MG PO TABS
3.0000 mg | ORAL_TABLET | Freq: Every day | ORAL | Status: AC
  Administered 2021-03-19 (×2): 3 mg via ORAL
  Filled 2021-03-19 (×2): qty 1

## 2021-03-19 MED ORDER — METOPROLOL TARTRATE 50 MG PO TABS
75.0000 mg | ORAL_TABLET | Freq: Two times a day (BID) | ORAL | Status: DC
  Administered 2021-03-19 – 2021-03-21 (×4): 75 mg via ORAL
  Filled 2021-03-19 (×4): qty 1

## 2021-03-19 NOTE — Progress Notes (Signed)
PROGRESS NOTE    Amy Hickman  YQM:[id number] DOB: Dec 26, 1935 DOA: 03/15/2021 PCP: Drosinis, Pamalee Leyden, PA-C   Brief Narrative: Amy Hickman is a 85 y.o. female with a history of COPD celiac, GERD, hypertension recent of lower extremity, OSA not on CPAP, CAD, CKD. IVC filter, aortic valve stenosis, GERD. Patient presented secondary to cough and was found to have influenza infection with evidence of sepsis and COPD exacerbation. While admitted, patient also developed new onset atrial fibrillation with RVR, now on a Cardizem drip. Cardiology consulted and are planning Transthoracic Echocardiogram/Cardioversion.  Assessment & Plan:   Principal Problem:   Sepsis ([place]) Active Problems:   Influenza A   Anemia   Atrial fibrillation with RVR (HCC)   COPD with acute exacerbation (HCC)   Dark stools   Hyponatremia   Hypothyroidism   CAD (coronary artery disease)   Essential hypertension   Hyperlipidemia   GERD (gastroesophageal reflux disease)   History of DVT (deep vein thrombosis)   Chronic systolic CHF (congestive heart failure) (HCC)   Biatrial enlargement   #1 sepsis secondary to influenza A present on admission.  Blood cultures negative.  #2 new onset atrial fibrillation in the setting of influenza A-rate controlled on diltiazem 360 mg daily and metoprolol 25 mg every 6. Cardiology following. Normal TSH. Plan for outpatient DC cardioversion after 3 to 4 weeks of uninterrupted anticoagulation.  #3 history of PE DVT and IVC filter was on Coumadin previously  #4 new onset systolic heart failure with EF 40 to 45%.  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  Sodium stable.  #8 COPD exacerbation secondary to influenza A.  Continue duo nebs and steroids.  Continue Tamiflu finish a course of 5 days.  #9  leukopenia resolved    Estimated body mass index is 30.27 kg/m as calculated from the following:   Height as of this encounter: 5' (1.524 m).   Weight as of this encounter: 70.3 kg.  DVT prophylaxis: SCD  code Status: DNR  family Communication: None at bedside Disposition Plan:  Status is: Inpatient  Remains inpatient appropriate because: iv cardizem   Consultants:  cardiology  Procedures: none Antimicrobials: rocephin tamiflu  Subjective:  Feels better  Objective: Vitals:   03/18/21 2056 03/19/21 0350 03/19/21 0858 03/19/21 0918  BP: 130/85 (!) 139/94  (!) 150/99  Pulse: 97 94    Resp: 18 20    Temp: 98 F (36.7 C)     TempSrc: Oral     SpO2: 96% 94% 96%   Weight:      Height:       No intake or output data in the 24 hours ending 03/19/21 1219 Filed Weights   03/15/21 1442  Weight: 70.3 kg    Examination:  General exam: Appears in nad  Respiratory system: Clear to auscultation. Respiratory effort normal. Cardiovascular system: S1 & S2 heard, RRR. No JVD, murmurs, rubs, gallops or clicks. No pedal edema. Gastrointestinal system: Abdomen is nondistended, soft and nontender. No organomegaly or masses felt. Normal bowel sounds heard. Central nervous system: Alert and oriented. No focal neurological deficits. Extremities: Symmetric 5 x 5 power. Skin: No rashes, lesions or ulcers Psychiatry: Judgement and insight appear normal. Mood & affect appropriate.     Data Reviewed: I have personally reviewed following labs and imaging studies  CBC: Recent Labs  Lab 03/15/21 1549 03/16/21 0149 03/17/21 0426  WBC 3.3* 2.1* 4.2  NEUTROABS 2.0 1.8  --   HGB 11.0* 10.6* 10.6*  HCT 36.8 34.5* 34.0*  MCV 87.4 86.9 85.4  PLT 197 194 177   Basic Metabolic Panel: Recent Labs  Lab 03/15/21 1549 03/15/21 2330 03/16/21 0149 03/17/21 0426  NA 133*  --  130* 135  K 4.3  --  4.5 4.5  CL 102  --  100 104  CO2 22  --  20* 23  GLUCOSE 107*  --  182* 179*  BUN 15  --   14 22  CREATININE 1.01*  --  0.96 0.98  CALCIUM 9.4  --  9.1 9.3  MG  --  1.9 1.9  --   PHOS  --  2.8 2.8  --    GFR: Estimated Creatinine Clearance: 38.7 mL/min (by C-G formula based on SCr of 0.98 mg/dL). Liver Function Tests: Recent Labs  Lab 03/15/21 1549 03/16/21 0149  AST 31 30  ALT 23 22  ALKPHOS 57 58  BILITOT 0.3 0.8  PROT 6.4* 6.3*  ALBUMIN 3.8 3.7   No results for input(s): LIPASE, AMYLASE in the last 168 hours. No results for input(s): AMMONIA in the last 168 hours. Coagulation Profile: No results for input(s): INR, PROTIME in the last 168 hours. Cardiac Enzymes: Recent Labs  Lab 03/15/21 2330  CKTOTAL 72   BNP (last 3 results) No results for input(s): PROBNP in the last 8760 hours. HbA1C: No results for input(s): HGBA1C in the last 72 hours. CBG: No results for input(s): GLUCAP in the last 168 hours. Lipid Profile: No results for input(s): CHOL, HDL, LDLCALC, TRIG, CHOLHDL, LDLDIRECT in the last 72 hours. Thyroid Function Tests: No results for input(s): TSH, T4TOTAL, FREET4, T3FREE, THYROIDAB in the last 72 hours. Anemia Panel: No results for input(s): VITAMINB12, FOLATE, FERRITIN, TIBC, IRON, RETICCTPCT in the last 72 hours. Sepsis Labs: Recent Labs  Lab 03/15/21 2330 03/16/21 0149  PROCALCITON <0.10  --   LATICACIDVEN 1.2 1.3    Recent Results (from the past 240 hour(s))  Resp Panel by RT-PCR (Flu A&B, Covid) Nasopharyngeal Swab     Status: Abnormal   Collection Time: 03/15/21  3:49 PM   Specimen: Nasopharyngeal Swab; Nasopharyngeal(NP) swabs in vial transport medium  Result Value Ref Range Status   SARS Coronavirus 2 by RT PCR NEGATIVE NEGATIVE Final    Comment: (NOTE) SARS-CoV-2 target nucleic acids are NOT DETECTED.  The SARS-CoV-2 RNA is generally detectable in upper respiratory specimens during the acute phase of infection. The lowest concentration of SARS-CoV-2 viral copies this assay can detect is 138 copies/mL. A negative result  does not preclude SARS-Cov-2 infection and should not be used as the sole basis for treatment or other patient management decisions. A negative result may occur with  improper specimen collection/handling, submission of specimen other than nasopharyngeal swab, presence of viral mutation(s) within the areas targeted by this assay, and inadequate number of viral copies(<138 copies/mL). A negative result must be combined with clinical observations, patient history, and epidemiological information. The expected result is Negative.  Fact Sheet for Patients:  EntrepreneurPulse.com.au  Fact Sheet for Healthcare Providers:  IncredibleEmployment.be  This test is no t yet approved or cleared by the [place] FDA and  has been authorized for detection and/or diagnosis of SARS-CoV-2 by FDA under an Emergency Use Authorization (EUA). This EUA will remain  in effect (meaning this test can be used) for the duration of the  COVID-19 declaration under Section 564(b)(1) of the Act, 21 U.S.C.section 360bbb-3(b)(1), unless the authorization is terminated  or revoked sooner.       Influenza A by PCR POSITIVE (A) NEGATIVE Final   Influenza B by PCR NEGATIVE NEGATIVE Final    Comment: (NOTE) The Xpert Xpress SARS-CoV-2/FLU/RSV plus assay is intended as an aid in the diagnosis of influenza from Nasopharyngeal swab specimens and should not be used as a sole basis for treatment. Nasal washings and aspirates are unacceptable for Xpert Xpress SARS-CoV-2/FLU/RSV testing.  Fact Sheet for Patients: EntrepreneurPulse.com.au  Fact Sheet for Healthcare Providers: IncredibleEmployment.be  This test is not yet approved or cleared by the [place] FDA and has been authorized for detection and/or diagnosis of SARS-CoV-2 by FDA under an Emergency Use Authorization (EUA). This EUA will remain in effect (meaning this test can be used)  for the duration of the COVID-19 declaration under Section 564(b)(1) of the Act, 21 U.S.C. section 360bbb-3(b)(1), unless the authorization is terminated or revoked.  Performed at [facility], [address] [place], [place] [address]   Culture, blood (x 2)     Status: None (Preliminary result)   Collection Time: 03/15/21 11:30 PM   Specimen: BLOOD  Result Value Ref Range Status   Specimen Description   Final    BLOOD RIGHT WRIST Performed at [facility] [address], [place], [place] [address]    Special Requests   Final    BOTTLES DRAWN AEROBIC ONLY Blood Culture adequate volume Performed at [facility] [address] [place], [place] [address]    Culture   Final    NO GROWTH 3 DAYS Performed at [facility], [place] [address] [place], [place] [address]    Report Status PENDING  Incomplete  Culture, blood (x 2)     Status: None (Preliminary result)   Collection Time: 03/15/21 11:30 PM   Specimen: BLOOD  Result Value Ref Range Status   Specimen Description   Final    BLOOD BLOOD LEFT FOREARM Performed at [facility] [address], [place], [place] [address]    Special Requests   Final    BOTTLES DRAWN AEROBIC ONLY Blood Culture adequate volume Performed at [facility] [address] [place], [place] [address]    Culture   Final    NO GROWTH 3 DAYS Performed at [facility], [place] [address] [place], [place] [address]    Report Status PENDING  Incomplete  MRSA Next Gen by PCR, Nasal     Status: None   Collection Time: 03/16/21 12:56 AM   Specimen: Urine, Clean Catch; Nasal Swab  Result Value Ref Range Status   MRSA by PCR Next Gen NOT DETECTED NOT DETECTED Final    Comment: (NOTE) The GeneXpert MRSA Assay (FDA approved for NASAL specimens only), is one component of a comprehensive MRSA colonization surveillance program. It is not intended to diagnose MRSA  infection nor to guide or monitor treatment for MRSA infections. Test performance is not FDA approved in patients less than 78 years old. Performed at [facility], [place] [address] [place], [place] [address]          Radiology Studies: DG Chest 2 View  Result Date: 03/18/2021 CLINICAL DATA:  Shortness of breath EXAM: CHEST - 2 VIEW COMPARISON:  03/15/2021 FINDINGS: Enlargement of cardiac silhouette with pulmonary vascular congestion. Question small hiatal hernia. Minimal LEFT basilar atelectasis  and small bibasilar effusions. No acute infiltrate or pneumothorax. Osseous demineralization with BILATERAL shoulder prostheses. IMPRESSION: Enlargement of cardiac silhouette with pulmonary vascular congestion. Small BIBASILAR effusions and minimal LEFT basilar atelectasis. Electronically Signed   By: Lavonia Dana M.D.   On: 03/18/2021 13:27        Scheduled Meds:  apixaban  5 mg Oral BID   dextromethorphan  30 mg Oral BID   diltiazem  360 mg Oral Daily   furosemide  20 mg Oral Daily   ipratropium-albuterol  3 mL Nebulization BID   latanoprost  1 drop Both Eyes QHS   levothyroxine  75 mcg Oral Daily   melatonin  3 mg Oral QHS   metoprolol tartrate  75 mg Oral BID   oseltamivir  30 mg Oral BID   pantoprazole  40 mg Oral BID   predniSONE  40 mg Oral Q breakfast   Continuous Infusions:  sodium chloride 20 mL/hr at 03/18/21 0458   cefTRIAXone (ROCEPHIN)  IV 1 g (03/19/21 0000)     LOS: 4 days     Georgette Shell, MD 03/19/2021, 12:19 PM

## 2021-03-19 NOTE — Evaluation (Signed)
Physical Therapy Evaluation Patient Details Name: Amy Hickman MRN: [id number] DOB: 12-18-1935 Today's Date: 03/19/2021  History of Present Illness  85 yo female admitted with Afib, flu. Hx of COPD, OSA, CAD, CKD, bil TKA, bil TSA, aortic valve stenosis, IVC filter  Clinical Impression  On eval, pt was Supv for mobility. She walked ~150 feet around the unit with intermittent use of hallway handrail to steady-no overt LOB. HR 105 bpm with ambulation. Do not anticipate any f/u PT needs after discharge-pt in agreement. Will plan to follow during hospital stay.      Recommendations for follow up therapy are one component of a multi-disciplinary discharge planning process, led by the attending physician.  Recommendations may be updated based on patient status, additional functional criteria and insurance authorization.  Follow Up Recommendations No PT follow up    Assistance Recommended at Discharge Intermittent Supervision/Assistance  Functional Status Assessment Patient has had a recent decline in their functional status and demonstrates the ability to make significant improvements in function in a reasonable and predictable amount of time.  Equipment Recommendations  None recommended by PT    Recommendations for Other Services       Precautions / Restrictions Precautions Precautions: Fall Required Braces or Orthoses:  (R ortho shoe) Restrictions Weight Bearing Restrictions: No Other Position/Activity Restrictions: Recent broken foot from fall 3-4 weeks ago. Pt and son denied WB restrictions with Orthopedic shoe.      Mobility  Bed Mobility Overal bed mobility: Modified Independent             General bed mobility comments: HOB elevated.    Transfers Overall transfer level: Modified independent                      Ambulation/Gait Ambulation/Gait assistance: Supervision Gait Distance (Feet): 150 Feet Assistive device: None (vs hallway handrail) Gait  Pattern/deviations: Step-through pattern       General Gait Details: Intermittent use of hallway handrail. No overt LOB. HR 105 bpm with ambulation. Pt reported some fatigue  Stairs            Wheelchair Mobility    Modified Rankin (Stroke Patients Only)       Balance Overall balance assessment: Mild deficits observed, not formally tested;History of Falls                                           Pertinent Vitals/Pain Pain Assessment: No/denies pain    Home Living Family/patient expects to be discharged to:: Private residence Living Arrangements: Alone Available Help at Discharge: Family;Available PRN/intermittently;Other (Comment) Type of Home: House Home Access: Level entry     Alternate Level Stairs-Number of Steps: Chair lift between floors which pt uses consistently. Home Layout: Multi-level Home Equipment: Rollator (4 wheels);Rolling Walker (2 wheels);Electric scooter Additional Comments: Stair lift; R Ortho shoe. Pt endorses fall and broken foot 3 weeks ago. Pt denied WB restrictions with Orthopedic shoe.    Prior Function Prior Level of Function : Independent/Modified Independent;History of Falls (last six months)             Mobility Comments: Does not use AD. Pt reports all above DME was for her husband who has passed away. ADLs Comments: Independent with all Basic and instrumental ADLs.     Hand Dominance        Extremity/Trunk Assessment   Upper Extremity Assessment  Upper Extremity Assessment: Defer to OT evaluation    Lower Extremity Assessment Lower Extremity Assessment: Defer to PT evaluation    Cervical / Trunk Assessment Cervical / Trunk Assessment: Normal  Communication   Communication: HOH  Cognition Arousal/Alertness: Awake/alert Behavior During Therapy: WFL for tasks assessed/performed Overall Cognitive Status: Within Functional Limits for tasks assessed                                           General Comments      Exercises     Assessment/Plan    PT Assessment Patient needs continued PT services  PT Problem List Decreased strength;Decreased mobility;Decreased activity tolerance;Decreased balance       PT Treatment Interventions DME instruction;Therapeutic activities;Gait training;Therapeutic exercise;Balance training;Functional mobility training    PT Goals (Current goals can be found in the Care Plan section)  Acute Rehab PT Goals Patient Stated Goal: home soon PT Goal Formulation: With patient Time For Goal Achievement: 04/02/21 Potential to Achieve Goals: Good    Frequency Min 3X/week   Barriers to discharge        Co-evaluation               AM-PAC PT "6 Clicks" Mobility  Outcome Measure Help needed turning from your back to your side while in a flat bed without using bedrails?: None Help needed moving from lying on your back to sitting on the side of a flat bed without using bedrails?: None Help needed moving to and from a bed to a chair (including a wheelchair)?: None Help needed standing up from a chair using your arms (e.g., wheelchair or bedside chair)?: None Help needed to walk in hospital room?: A Little Help needed climbing 3-5 steps with a railing? : A Little 6 Click Score: 22    End of Session   Activity Tolerance: Patient tolerated treatment well Patient left: in bed;with call bell/phone within reach;with bed alarm set   PT Visit Diagnosis: History of falling (Z91.81);Difficulty in walking, not elsewhere classified (R26.2)    Time: 3329-5188 PT Time Calculation (min) (ACUTE ONLY): 25 min   Charges:   PT Evaluation $PT Eval Moderate Complexity: 1 Mod PT Treatments $Gait Training: 8-22 mins           Doreatha Massed, PT Acute Rehabilitation  Office: [phone] Pager: [phone]

## 2021-03-19 NOTE — Progress Notes (Addendum)
Progress Note  Patient Name: Amy Hickman Date of Encounter: 03/19/2021  Primary Cardiologist: Buford Dresser, MD (new to our group, previously followed by Dr. Otho Perl in [place])  Subjective   She reports feeling significantly better today than yesterday. Reports +++UOP overnight with improvement in breathing, swelling.   Inpatient Medications    Scheduled Meds:  apixaban  5 mg Oral BID   dextromethorphan  30 mg Oral BID   diltiazem  360 mg Oral Daily   furosemide  20 mg Oral Daily   ipratropium-albuterol  3 mL Nebulization BID   latanoprost  1 drop Both Eyes QHS   levothyroxine  75 mcg Oral Daily   melatonin  3 mg Oral QHS   metoprolol tartrate  25 mg Oral Q6H   oseltamivir  30 mg Oral BID   pantoprazole  40 mg Oral BID   predniSONE  40 mg Oral Q breakfast   sodium chloride  1 g Oral TID   Continuous Infusions:  sodium chloride 20 mL/hr at 03/18/21 0458   cefTRIAXone (ROCEPHIN)  IV 1 g (03/19/21 0000)   PRN Meds: acetaminophen **OR** acetaminophen, albuterol, benzonatate, lip balm   Vital Signs    Vitals:   03/18/21 1943 03/18/21 2056 03/19/21 0350 03/19/21 0858  BP:  130/85 (!) 139/94   Pulse:  97 94   Resp:  18 20   Temp:  98 F (36.7 C)    TempSrc:  Oral    SpO2: 99% 96% 94% 96%  Weight:      Height:       No intake or output data in the 24 hours ending 03/19/21 0900 Last 3 Weights 03/15/2021 01/22/2019 07/29/2016  Weight (lbs) 155 lb 149 lb 148 lb  Weight (kg) 70.308 kg 67.586 kg 67.132 kg     Telemetry    Atrial fib rates 90s - Personally Reviewed  Physical Exam   GEN: No acute distress.  HEENT: Normocephalic, atraumatic, sclera non-icteric. Neck: No JVD or bruits. Cardiac: Irregularly irregular, 2/6 SEM heard over precordium, no rubs  or gallops.  Respiratory: Coarse BS throughout but no overt wheezing, rales or rhonchi. Deep inspiration does trigger coughing. Breathing is unlabored. GI: Soft, nontender, non-distended, BS +x 4. MS: no  deformity. Extremities: No clubbing or cyanosis. No edema. Distal pedal pulses are 2+ and equal bilaterally. Neuro:  AAOx3. Follows commands. Psych:  Responds to questions appropriately with a normal affect.  Labs    High Sensitivity Troponin:   Recent Labs  Lab 03/15/21 2330 03/16/21 0149 03/17/21 1324  TROPONINIHS 8 8 11       Cardiac EnzymesNo results for input(s): TROPONINI in the last 168 hours. No results for input(s): TROPIPOC in the last 168 hours.   Chemistry Recent Labs  Lab 03/15/21 1549 03/16/21 0149 03/17/21 0426  NA 133* 130* 135  K 4.3 4.5 4.5  CL 102 100 104  CO2 22 20* 23  GLUCOSE 107* 182* 179*  BUN 15 14 22   CREATININE 1.01* 0.96 0.98  CALCIUM 9.4 9.1 9.3  PROT 6.4* 6.3*  --   ALBUMIN 3.8 3.7  --   AST 31 30  --   ALT 23 22  --   ALKPHOS 57 58  --   BILITOT 0.3 0.8  --   GFRNONAA 56* 59* 58*  ANIONGAP 9 10 8      Hematology Recent Labs  Lab 03/15/21 1549 03/15/21 2330 03/16/21 0149 03/17/21 0426  WBC 3.3*  --  2.1* 4.2  RBC 4.21  4.11 3.97 3.98  HGB 11.0*  --  10.6* 10.6*  HCT 36.8  --  34.5* 34.0*  MCV 87.4  --  86.9 85.4  MCH 26.1  --  26.7 26.6  MCHC 29.9*  --  30.7 31.2  RDW 17.6*  --  17.5* 17.7*  PLT 197  --  194 211    BNP Recent Labs  Lab 03/18/21 0409  BNP 653.4*     DDimer No results for input(s): DDIMER in the last 168 hours.   Radiology    DG Chest 2 View  Result Date: 03/18/2021 CLINICAL DATA:  Shortness of breath EXAM: CHEST - 2 VIEW COMPARISON:  03/15/2021 FINDINGS: Enlargement of cardiac silhouette with pulmonary vascular congestion. Question small hiatal hernia. Minimal LEFT basilar atelectasis and small bibasilar effusions. No acute infiltrate or pneumothorax. Osseous demineralization with BILATERAL shoulder prostheses. IMPRESSION: Enlargement of cardiac silhouette with pulmonary vascular congestion. Small BIBASILAR effusions and minimal LEFT basilar atelectasis. Electronically Signed   By: Lavonia Dana M.D.    On: 03/18/2021 13:27    Cardiac Studies   2D echo 03/16/21  1. Left ventricular ejection fraction, by estimation, is 40 to 45%. The  left ventricle has mildly decreased function. The left ventricle  demonstrates global hypokinesis. Left ventricular diastolic parameters are  indeterminate.   2. Right ventricular systolic function is mildly reduced. The right  ventricular size is normal. There is moderately elevated pulmonary artery  systolic pressure. The estimated right ventricular systolic pressure is  97.9 mmHg.   3. Left atrial size was moderately dilated.   4. Right atrial size was severely dilated.   5. The mitral valve is normal in structure. Mild to moderate mitral valve  regurgitation.   6. Tricuspid valve regurgitation is moderate.   7. The aortic valve is tricuspid. There is severe calcifcation of the  aortic valve. Aortic valve regurgitation is mild. Moderate aortic valve  stenosis. Gradients consistent with mild AS (MG 55mHg, Vmax 2.2 m/s), but  moderate by AVA (1.0cm^2) and DI  (0.32). Low SV index, suspect low flow low gradient moderate AS   8. The inferior vena cava is dilated in size with <50% respiratory  variability, suggesting right atrial pressure of 15 mmHg.   Patient Profile     85y.o. female with SVT, HTN, HLD, GERD, DVT/PE s/p IVC filter (intolerant to coumadin 2/2 symptoms of GI bleeding), Crohn's disease, hypothyroidism, [facility] (25% ost-prox LAD, 60% mLAD, 60% D1, 20% OM2, 40% mRCA treated medically), COPD, UTIs, mild aortic stenosis, [facility], nausea with post-tussive emesis, poor appetite and weakness. Found to have influenza A, leukopenia, AECOPD, and new onset atrial fib.  Assessment & Plan    1. Persistent atrial fib, new diagnosis, in the context of influenza A - TSH wnl - prior hx of GIB noted remotely - will need to follow CBC and clinical signs closely as OP -  current cardiac meds include apixaban 538mBID, diltiazem 36070maily, metoprolol 64m84mhr, Lasix 20mg55mly - plan for rate control strategy + anticoagulation initially with re-eval as OP and plan for DCCV after 3-4 weeks of uninterrupted anticoagulation  2. New cardiomyopathy/possible acute systolic CHF - prior [facility] 06/2020 EF low-normal 50-55%, here LVEF is now 40-45% - potentially tachy-mediated vs stress cardiomyopathy, cannot fully exclude progressive CAD but would treat medically initially and consider ischemic evaluation as OP if LVEF does not improve with supportive treatment - CXR yesterday  CXR yesterday with enlargement of cardiac silhouette (visually appears similar to prior) with pulmonary vascular congestion, small bibasilar effusions, minimal L basilar atx -> pt reports ++UOP overnight so suspect she is experiencing both effect from Lasix and overloading from autodiuresis now that HR/perfusion improved - currently on cardiac meds as above; she is also on salt chloride tablets (chart indicates previously prescribed to keep her sodium up) will review optimization of medication regimen with Dr. Harrell Gave - needs BMET today - will order  3. Mild-moderate MR, moderate TR, mild AI, mild AS but possibly low flow low gradient moderate AS - continue to follow clinically as outpatient  4. CAD - developed some chest discomfort in the context of AF RVR, improved, troponins negative as above - ASA stopped given initiation of apixaban - already on beta blocker - consider resumption of statin  5. Hx PE/DVT with prior IVC filter - f/u primary care as OP  6. Chronic appearing anemia - prior Hgb 11/2020 10.9, similar to values this admission - will add f/u CBC today - appreciate IM input on management of anemia   Remainder of medical problems per IM.  Briefly inquired about patient's f/u plan. She is contemplating switch to [facility]. She will let us know what she  decides.  For questions or updates, please contact [place] Please consult [email or web address] for contact info under Cardiology/STEMI.  Signed, Charlie Pitter, PA-C 03/19/2021, 9:00 AM

## 2021-03-20 LAB — BASIC METABOLIC PANEL
Anion gap: 5 (ref 5–15)
BUN: 32 mg/dL — ABNORMAL HIGH (ref 8–23)
CO2: 31 mmol/L (ref 22–32)
Calcium: 9.5 mg/dL (ref 8.9–10.3)
Chloride: 98 mmol/L (ref 98–111)
Creatinine, Ser: 1.11 mg/dL — ABNORMAL HIGH (ref 0.44–1.00)
GFR, Estimated: 50 mL/min — ABNORMAL LOW (ref >60)
Glucose, Bld: 134 mg/dL — ABNORMAL HIGH (ref 70–99)
Potassium: 4.8 mmol/L (ref 3.5–5.1)
Sodium: 134 mmol/L — ABNORMAL LOW (ref 135–145)

## 2021-03-20 LAB — CBC
HCT: 36.2 % (ref 36.0–46.0)
Hemoglobin: 11.1 g/dL — ABNORMAL LOW (ref 12.0–15.0)
MCH: 26.4 pg (ref 26.0–34.0)
MCHC: 30.7 g/dL (ref 30.0–36.0)
MCV: 86.2 fL (ref 80.0–100.0)
Platelets: 226 10*3/uL (ref 150–400)
RBC: 4.2 MIL/uL (ref 3.87–5.11)
RDW: 17.3 % — ABNORMAL HIGH (ref 11.5–15.5)
WBC: 6.4 10*3/uL (ref 4.0–10.5)
nRBC: 0 % (ref 0.0–0.2)

## 2021-03-20 MED ORDER — DICLOFENAC SODIUM 1 % EX GEL
2.0000 g | Freq: Four times a day (QID) | CUTANEOUS | Status: DC
  Administered 2021-03-20 – 2021-03-21 (×3): 2 g via TOPICAL
  Filled 2021-03-20: qty 100

## 2021-03-20 NOTE — Progress Notes (Signed)
PROGRESS NOTE    Amy Hickman DOB: 08-12-1935 DOA: 03/15/2021 PCP: Drosinis, Pamalee Leyden, PA-C   Brief Narrative: Amy Hickman is a 85 y.o. female with a history of COPD celiac, GERD, hypertension recent of lower extremity, OSA not on CPAP, CAD, CKD. IVC filter, aortic valve stenosis, GERD. Patient presented secondary to cough and was found to have influenza infection with evidence of sepsis and COPD exacerbation. While admitted, patient also developed new onset atrial fibrillation with RVR, now on a Cardizem drip. Cardiology consulted and are planning Transthoracic Echocardiogram/Cardioversion.  Assessment & Plan:   Principal Problem:   Sepsis ([place]) Active Problems:   Influenza A   Anemia   Atrial fibrillation with RVR (HCC)   COPD with acute exacerbation (HCC)   Dark stools   Hyponatremia   Hypothyroidism   CAD (coronary artery disease)   Essential hypertension   Hyperlipidemia   GERD (gastroesophageal reflux disease)   History of DVT (deep vein thrombosis)   Chronic systolic CHF (congestive heart failure) (HCC)   Biatrial enlargement   #1 sepsis secondary to influenza A present on admission.  Blood cultures negative.  #2 new onset atrial fibrillation in the setting of influenza A-rate controlled on diltiazem 360 mg daily and metoprolol 25 mg every 6. Cardiology following. Normal TSH. Plan for outpatient DC cardioversion after 3 to 4 weeks of uninterrupted anticoagulation. Will plan for discharge home 03/21/2021 on Cardizem 360 mg daily, metoprolol tartrate 75 mg twice a day and Eliquis 5 mg twice daily.  DC salt tablets and thiazide diuretics.  Consider Aldactone if needed for long-term follow-up. On discharge diltiazem 360 mg daily, Lasix 20 mg daily, metoprolol tartrate 75 mg twice a day, apixaban 5 mg twice daily and atorvastatin 10 mg daily.  #3 history of PE DVT and IVC filter was on Coumadin previously  #4 new onset systolic heart failure with EF 40  to 45%.  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  Sodium stable.  #8 COPD exacerbation secondary to influenza A.  Continue duo nebs and steroids.  Continue Tamiflu finish a course of 5 days.  #9 leukopenia resolved    Estimated body mass index is 30.27 kg/m as calculated from the following:   Height as of this encounter: 5' (1.524 m).   Weight as of this encounter: 70.3 kg.  DVT prophylaxis: SCD  code Status: DNR  family Communication: None at bedside Disposition Plan:  Status is: Inpatient  Remains inpatient appropriate because: iv cardizem   Consultants:  cardiology  Procedures: none Antimicrobials: rocephin tamiflu  Subjective:  Complains of some left-sided reproducible pain Does not want to get rib x-rays had fall prior to admission to the hospital Reports feeling weak Objective: Vitals:   03/19/21 2052 03/19/21 2103 03/20/21 0608 03/20/21 1048  BP: (!) 142/86  (!) 152/94 (!) 141/90  Pulse: 92  75 91  Resp: 16  18   Temp: (!) 97.5 F (36.4 C)  97.8 F (36.6 C)   TempSrc: Oral  Oral   SpO2: 97% 96% 97%   Weight:      Height:       No intake or output data in the 24 hours ending 03/20/21 1354 Filed Weights   03/15/21 1442  Weight: 70.3 kg    Examination:  General exam: Appears in nad  Respiratory  system: Clear to auscultation. Respiratory effort normal.  Left ribs tender to palpation. Cardiovascular system: S1 & S2 heard, RRR. No JVD, murmurs, rubs, gallops or clicks. No pedal edema. Gastrointestinal system: Abdomen is nondistended, soft and nontender. No organomegaly or masses felt. Normal bowel sounds heard. Central nervous system: Alert and oriented. No focal neurological deficits. Extremities: Symmetric 5 x 5 power. Skin: No rashes, lesions or ulcers Psychiatry: Judgement and  insight appear normal. Mood & affect appropriate.     Data Reviewed: I have personally reviewed following labs and imaging studies  CBC: Recent Labs  Lab 03/15/21 1549 03/16/21 0149 03/17/21 0426 03/19/21 1212 03/20/21 0358  WBC 3.3* 2.1* 4.2 6.9 6.4  NEUTROABS 2.0 1.8  --   --   --   HGB 11.0* 10.6* 10.6* 11.5* 11.1*  HCT 36.8 34.5* 34.0* 36.7 36.2  MCV 87.4 86.9 85.4 85.5 86.2  PLT 197 194 211 234 197    Basic Metabolic Panel: Recent Labs  Lab 03/15/21 1549 03/15/21 2330 03/16/21 0149 03/17/21 0426 03/19/21 1212 03/20/21 0358  NA 133*  --  130* 135 136 134*  K 4.3  --  4.5 4.5 3.7 4.8  CL 102  --  100 104 99 98  CO2 22  --  20* 23 27 31   GLUCOSE 107*  --  182* 179* 107* 134*  BUN 15  --  14 22 25* 32*  CREATININE 1.01*  --  0.96 0.98 1.05* 1.11*  CALCIUM 9.4  --  9.1 9.3 9.5 9.5  MG  --  1.9 1.9  --   --   --   PHOS  --  2.8 2.8  --   --   --     GFR: Estimated Creatinine Clearance: 34.2 mL/min (A) (by C-G formula based on SCr of 1.11 mg/dL (H)). Liver Function Tests: Recent Labs  Lab 03/15/21 1549 03/16/21 0149  AST 31 30  ALT 23 22  ALKPHOS 57 58  BILITOT 0.3 0.8  PROT 6.4* 6.3*  ALBUMIN 3.8 3.7    No results for input(s): LIPASE, AMYLASE in the last 168 hours. No results for input(s): AMMONIA in the last 168 hours. Coagulation Profile: No results for input(s): INR, PROTIME in the last 168 hours. Cardiac Enzymes: Recent Labs  Lab 03/15/21 2330  CKTOTAL 72    BNP (last 3 results) No results for input(s): PROBNP in the last 8760 hours. HbA1C: No results for input(s): HGBA1C in the last 72 hours. CBG: Recent Labs  Lab 03/19/21 1234  GLUCAP 135*   Lipid Profile: No results for input(s): CHOL, HDL, LDLCALC, TRIG, CHOLHDL, LDLDIRECT in the last 72 hours. Thyroid Function Tests: No results for input(s): TSH, T4TOTAL, FREET4, T3FREE, THYROIDAB in the last 72 hours. Anemia Panel: No results for input(s): VITAMINB12, FOLATE, FERRITIN,  TIBC, IRON, RETICCTPCT in the last 72 hours. Sepsis Labs: Recent Labs  Lab 03/15/21 2330 03/16/21 0149  PROCALCITON <0.10  --   LATICACIDVEN 1.2 1.3     Recent Results (from the past 240 hour(s))  Resp Panel by RT-PCR (Flu A&B, Covid) Nasopharyngeal Swab     Status: Abnormal   Collection Time: 03/15/21  3:49 PM   Specimen: Nasopharyngeal Swab; Nasopharyngeal(NP) swabs in vial transport medium  Result Value Ref Range Status   SARS Coronavirus 2 by RT PCR NEGATIVE NEGATIVE Final    Comment: (NOTE) SARS-CoV-2 target nucleic acids are NOT DETECTED.  The SARS-CoV-2 RNA is generally detectable in upper respiratory specimens during the acute phase of infection.  The lowest concentration of SARS-CoV-2 viral copies this assay can detect is 138 copies/mL. A negative result does not preclude SARS-Cov-2 infection and should not be used as the sole basis for treatment or other patient management decisions. A negative result may occur with  improper specimen collection/handling, submission of specimen other than nasopharyngeal swab, presence of viral mutation(s) within the areas targeted by this assay, and inadequate number of viral copies(<138 copies/mL). A negative result must be combined with clinical observations, patient history, and epidemiological information. The expected result is Negative.  Fact Sheet for Patients:  EntrepreneurPulse.com.au  Fact Sheet for Healthcare Providers:  IncredibleEmployment.be  This test is no t yet approved or cleared by the [place] FDA and  has been authorized for detection and/or diagnosis of SARS-CoV-2 by FDA under an Emergency Use Authorization (EUA). This EUA will remain  in effect (meaning this test can be used) for the duration of the COVID-19 declaration under Section 564(b)(1) of the Act, 21 U.S.C.section 360bbb-3(b)(1), unless the authorization is terminated  or revoked sooner.       Influenza A  by PCR POSITIVE (A) NEGATIVE Final   Influenza B by PCR NEGATIVE NEGATIVE Final    Comment: (NOTE) The Xpert Xpress SARS-CoV-2/FLU/RSV plus assay is intended as an aid in the diagnosis of influenza from Nasopharyngeal swab specimens and should not be used as a sole basis for treatment. Nasal washings and aspirates are unacceptable for Xpert Xpress SARS-CoV-2/FLU/RSV testing.  Fact Sheet for Patients: EntrepreneurPulse.com.au  Fact Sheet for Healthcare Providers: IncredibleEmployment.be  This test is not yet approved or cleared by the [place] FDA and has been authorized for detection and/or diagnosis of SARS-CoV-2 by FDA under an Emergency Use Authorization (EUA). This EUA will remain in effect (meaning this test can be used) for the duration of the COVID-19 declaration under Section 564(b)(1) of the Act, 21 U.S.C. section 360bbb-3(b)(1), unless the authorization is terminated or revoked.  Performed at [facility], [address] [place], [place] [address]   Culture, blood (x 2)     Status: None (Preliminary result)   Collection Time: 03/15/21 11:30 PM   Specimen: BLOOD  Result Value Ref Range Status   Specimen Description   Final    BLOOD RIGHT WRIST Performed at [facility] [address] [place], [place] [address]    Special Requests   Final    BOTTLES DRAWN AEROBIC ONLY Blood Culture adequate volume Performed at [facility] [address], [place], [place] [address]    Culture   Final    NO GROWTH 4 DAYS Performed at [facility], [place] [address] [place], [place] [address]    Report Status PENDING  Incomplete  Culture, blood (x 2)     Status: None (Preliminary result)   Collection Time: 03/15/21 11:30 PM   Specimen: BLOOD  Result Value Ref Range Status   Specimen Description   Final    BLOOD BLOOD LEFT FOREARM Performed at [facility]  [address] [place], [place] [address]    Special Requests   Final    BOTTLES DRAWN AEROBIC ONLY Blood Culture adequate volume Performed at [facility] [address], [place], [place] [address]    Culture   Final    NO GROWTH 4 DAYS Performed at [facility], [place] [address], [place], [place] [address]    Report Status PENDING  Incomplete  MRSA Next Gen by  PCR, Nasal     Status: None   Collection Time: 03/16/21 12:56 AM   Specimen: Urine, Clean Catch; Nasal Swab  Result Value Ref Range Status   MRSA by PCR Next Gen NOT DETECTED NOT DETECTED Final    Comment: (NOTE) The GeneXpert MRSA Assay (FDA approved for NASAL specimens only), is one component of a comprehensive MRSA colonization surveillance program. It is not intended to diagnose MRSA infection nor to guide or monitor treatment for MRSA infections. Test performance is not FDA approved in patients less than 9 years old. Performed at [facility], [place] [address] [place], [place] [address]           Radiology Studies: No results found.      Scheduled Meds:  apixaban  5 mg Oral BID   dextromethorphan  30 mg Oral BID   diltiazem  360 mg Oral Daily   furosemide  20 mg Oral Daily   ipratropium-albuterol  3 mL Nebulization BID   latanoprost  1 drop Both Eyes QHS   levothyroxine  75 mcg Oral Daily   metoprolol tartrate  75 mg Oral BID   pantoprazole  40 mg Oral BID   Continuous Infusions:  sodium chloride 20 mL/hr at 03/18/21 0458     LOS: 5 days     Georgette Shell, MD 03/20/2021, 1:54 PM

## 2021-03-20 NOTE — Progress Notes (Signed)
Received report from Hilliard Clark, RN. Assuming care of patient at this time.

## 2021-03-20 NOTE — Care Management Important Message (Signed)
Important Message  Patient Details IM Letter given to the Patient Name: Amy Hickman MRN: [id number] Date of Birth: [birth year]   Medicare Important Message Given:  Yes     Kerin Salen 03/20/2021, 2:05 PM

## 2021-03-20 NOTE — Plan of Care (Signed)
  Problem: Education: Goal: Knowledge of General Education information will improve Description Including pain rating scale, medication(s)/side effects and non-pharmacologic comfort measures Outcome: Progressing   

## 2021-03-20 NOTE — Progress Notes (Signed)
Patient ambulated in the hall with this R.N. around the length of half of the unit. Patient tolerated walk well. No shortness of breath or gait issues.

## 2021-03-21 LAB — CBC
HCT: 36.1 % (ref 36.0–46.0)
Hemoglobin: 11.5 g/dL — ABNORMAL LOW (ref 12.0–15.0)
MCH: 26.6 pg (ref 26.0–34.0)
MCHC: 31.9 g/dL (ref 30.0–36.0)
MCV: 83.4 fL (ref 80.0–100.0)
Platelets: 254 10*3/uL (ref 150–400)
RBC: 4.33 MIL/uL (ref 3.87–5.11)
RDW: 17.1 % — ABNORMAL HIGH (ref 11.5–15.5)
WBC: 8.7 10*3/uL (ref 4.0–10.5)
nRBC: 0 % (ref 0.0–0.2)

## 2021-03-21 LAB — BASIC METABOLIC PANEL WITH GFR
Anion gap: 7 (ref 5–15)
BUN: 40 mg/dL — ABNORMAL HIGH (ref 8–23)
CO2: 26 mmol/L (ref 22–32)
Calcium: 9.2 mg/dL (ref 8.9–10.3)
Chloride: 98 mmol/L (ref 98–111)
Creatinine, Ser: 1.28 mg/dL — ABNORMAL HIGH (ref 0.44–1.00)
GFR, Estimated: 42 mL/min — ABNORMAL LOW
Glucose, Bld: 111 mg/dL — ABNORMAL HIGH (ref 70–99)
Potassium: 4.5 mmol/L (ref 3.5–5.1)
Sodium: 131 mmol/L — ABNORMAL LOW (ref 135–145)

## 2021-03-21 LAB — CULTURE, BLOOD (ROUTINE X 2)
Culture: NO GROWTH
Culture: NO GROWTH
Special Requests: ADEQUATE
Special Requests: ADEQUATE

## 2021-03-21 MED ORDER — DICLOFENAC SODIUM 1 % EX GEL: 2.0000 g | Freq: Four times a day (QID) | CUTANEOUS | 0 refills | Status: DC

## 2021-03-21 MED ORDER — DILTIAZEM HCL ER COATED BEADS 360 MG PO CP24: 360.0000 mg | ORAL_CAPSULE | Freq: Every day | ORAL | 2 refills | Status: DC

## 2021-03-21 MED ORDER — ATORVASTATIN CALCIUM 10 MG PO TABS: 10.0000 mg | ORAL_TABLET | Freq: Every evening | ORAL | 2 refills | Status: DC

## 2021-03-21 MED ORDER — METOPROLOL TARTRATE 75 MG PO TABS: 75.0000 mg | ORAL_TABLET | Freq: Two times a day (BID) | ORAL | 2 refills | Status: DC

## 2021-03-21 MED ORDER — ATORVASTATIN CALCIUM 10 MG PO TABS: 10.0000 mg | ORAL_TABLET | Freq: Every day | ORAL | 11 refills | Status: DC

## 2021-03-21 MED ORDER — APIXABAN 5 MG PO TABS: 5.0000 mg | ORAL_TABLET | Freq: Two times a day (BID) | ORAL | 2 refills | Status: DC

## 2021-03-22 NOTE — Discharge Summary (Addendum)
Physician Discharge Summary  KELLE RUPPERT DTO:[id number] DOB: August 10, 1935 DOA: 03/15/2021  PCP: Mina Marble, PA-C  Admit date: 03/15/2021 Discharge date: 03/21/2021 Admitted From: Home Disposition: Home  Recommendations for Outpatient Follow-up:  Follow up with PCP in 1-2 weeks Please obtain BMP/CBC in one week Please follow up with cardiology Dr. Harrell Gave Home Health: None Equipment/Devices: None  Discharge Condition: Stable CODE STATUS: Full code Diet recommendation: Cardiac diet Brief/Interim Summary:  Amy Hickman is a 85 y.o. female with a history of COPD celiac, GERD, hypertension recent of lower extremity, OSA not on CPAP, CAD, CKD. IVC filter, aortic valve stenosis, GERD. Patient presented secondary to cough and was found to have influenza infection with evidence of sepsis and COPD exacerbation. While admitted, patient also developed new onset atrial fibrillation with RVR, was treated with Cardizem drip. Cardiology consulted and are planning Transthoracic Echocardiogram/Cardioversion as an outpatient.  Discharge Diagnoses:  Principal Problem:   Sepsis ([place]) Active Problems:   Influenza A   Anemia   Atrial fibrillation with RVR (HCC)   COPD with acute exacerbation (HCC)   Dark stools   Hyponatremia   Hypothyroidism   CAD (coronary artery disease)   Essential hypertension   Hyperlipidemia   GERD (gastroesophageal reflux disease)   History of DVT (deep vein thrombosis)   Chronic systolic CHF (congestive heart failure) (HCC)   Biatrial enlargement    #1 sepsis secondary to influenza A present on admission.  Blood cultures negative.  She was treated with Tamiflu.   #2 new onset atrial fibrillation in the setting of influenza A-she was treated with diltiazem drip and metoprolol.  Cardiology was consulted.  Patient had appointment made with cardiology prior to discharge for follow-up and possible cardioversion 3 to 4 weeks after uninterrupted  anticoagulation.  Her TSH was normal.   She was discharge home 03/21/2021 on Cardizem 360 mg daily, metoprolol tartrate 75 mg twice a day and Eliquis 5 mg twice daily.  DC salt tablets and thiazide diuretics.  Consider Aldactone if needed for long-term follow-up. On discharge diltiazem 360 mg daily, Lasix 20 mg daily, metoprolol tartrate 75 mg twice a day, apixaban 5 mg twice daily and atorvastatin 10 mg daily.   #3 history of PE DVT and IVC filter was on Coumadin previously   #4 new onset systolic heart failure with EF 40 to 45%.   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  Sodium stable.   #8 COPD exacerbation secondary to influenza A.     #9 leukopenia resolved  Estimated body mass index is 30.27 kg/m as calculated from the following:   Height as of this encounter: 5' (1.524 m).   Weight as of this encounter: 70.3 kg.  Discharge Instructions  Discharge Instructions     Call MD for:  difficulty breathing, headache or visual disturbances   Complete by: As directed    Diet - low sodium heart healthy   Complete by: As directed    Increase activity slowly   Complete by: As directed       Allergies as of 03/21/2021       Reactions   Ativan [lorazepam] Other (See Comments)   "Knocked her out, woke up 3 days later"   Augmentin [amoxicillin-pot Clavulanate] Nausea And Vomiting   Avelox [moxifloxacin] Itching, Nausea Only   Chlorzoxazone Other (See  Comments)   Went crazy/loopy   Ciprofloxacin Hcl Itching, Nausea Only   Crestor [rosuvastatin] Other (See Comments)   myalgia   Gluten Meal Other (See Comments)   Diarrhea, hot/cold sweat, will sleep for a couple of hours - Celiac disease   Nitrofuran Derivatives Itching, Nausea Only   Phenergan [promethazine Hcl] Other (See Comments)   hallucinations    Codeine Palpitations   Darvon [propoxyphene Hcl] Palpitations   Doxycycline Itching, Nausea Only        Medication List     STOP taking these medications    aspirin 81 MG EC tablet   diltiazem 120 MG 24 hr capsule Commonly known as: TIAZAC   doxazosin 4 MG tablet Commonly known as: CARDURA   doxycycline 100 MG capsule Commonly known as: VIBRAMYCIN   fenofibrate 54 MG tablet   ondansetron 4 MG tablet Commonly known as: ZOFRAN   pravastatin 20 MG tablet Commonly known as: PRAVACHOL   sodium chloride 1 g tablet   Travoprost (BAK Free) 0.004 % Soln ophthalmic solution Commonly known as: TRAVATAN       TAKE these medications    acetaminophen 500 MG tablet Commonly known as: TYLENOL Take 1 tablet (500 mg total) by mouth every 6 (six) hours as needed for pain.   apixaban 5 MG Tabs tablet Commonly known as: ELIQUIS Take 1 tablet (5 mg total) by mouth 2 (two) times daily.   atorvastatin 10 MG tablet Commonly known as: Lipitor Take 1 tablet (10 mg total) by mouth daily. What changed: when to take this   benzonatate 100 MG capsule Commonly known as: Tessalon Perles Take 1 capsule (100 mg total) by mouth 3 (three) times daily as needed for cough. What changed: how much to take   clidinium-chlordiazePOXIDE 5-2.5 MG capsule Commonly known as: LIBRAX Take 1 capsule by mouth daily as needed. cramping   co-enzyme Q-10 30 MG capsule Take 30 mg by mouth daily.   colchicine 0.6 MG tablet Take 0.6 mg by mouth daily as needed (for gout flareup).   Cranberry 400 MG Caps Take 400 mg by mouth daily.   diclofenac Sodium 1 % Gel Commonly known as: VOLTAREN Apply 2 g topically 4 (four) times daily.   diltiazem 360 MG 24 hr capsule Commonly known as: CARDIZEM CD Take 1 capsule (360 mg total) by mouth daily.   esomeprazole 40 MG capsule Commonly known as: NEXIUM Take 40 mg by mouth daily before breakfast.   estradiol 0.1 MG/GM vaginal cream Commonly known as:  ESTRACE Place 1 Applicatorful vaginally once a week.   furosemide 40 MG tablet Commonly known as: LASIX Take 20 mg by mouth daily.   latanoprost 0.005 % ophthalmic solution Commonly known as: XALATAN 1 drop at bedtime.   levothyroxine 75 MCG tablet Commonly known as: SYNTHROID Take 75 mcg by mouth daily.   Metoprolol Tartrate 75 MG Tabs Take 75 mg by mouth 2 (two) times daily. What changed:  medication strength how much to take   pentosan polysulfate 100 MG capsule Commonly known as: ELMIRON Take 100 mg by mouth 3 (three) times daily before meals.   pilocarpine 1 % ophthalmic solution Commonly known as: PILOCAR Place 1 drop into the left eye 3 (three) times daily.   sodium chloride 0.65 % Soln nasal spray Commonly known as: OCEAN Place 1 spray into both nostrils as needed for congestion.   Turmeric 500 MG Caps Take 500 mg by mouth daily.   Vitamin B Complex Tabs Take 1 tablet by  mouth daily.   Wixela Inhub 250-50 MCG/ACT Aepb Generic drug: fluticasone-salmeterol Inhale 2 puffs into the lungs at bedtime.        Follow-up Information     Buford Dresser, MD. Go on 04/08/2021.   Specialty: Cardiology Why: Appointment with Dr. Harrell Gave, 04/08/21 at 8:40 AM Contact information: [address] [place] [address] [phone]         Drosinis, Pamalee Leyden, PA-C Follow up.   Specialty: Internal Medicine Contact information: [place] [address] [phone]         Buford Dresser, MD .   Specialty: Cardiology Contact information: [place] [address] [phone]                Allergies  Allergen Reactions   Ativan [Lorazepam] Other (See Comments)    "Knocked her out, woke up 3 days later"   Augmentin [Amoxicillin-Pot Clavulanate] Nausea And Vomiting   Avelox [Moxifloxacin] Itching and Nausea Only   Chlorzoxazone Other (See Comments)    Went crazy/loopy   Ciprofloxacin Hcl  Itching and Nausea Only   Crestor [Rosuvastatin] Other (See Comments)    myalgia   Gluten Meal Other (See Comments)    Diarrhea, hot/cold sweat, will sleep for a couple of hours - Celiac disease   Nitrofuran Derivatives Itching and Nausea Only   Phenergan [Promethazine Hcl] Other (See Comments)    hallucinations   Codeine Palpitations   Darvon [Propoxyphene Hcl] Palpitations   Doxycycline Itching and Nausea Only    Consultations: cardiology   Procedures/Studies: DG Chest 2 View  Result Date: 03/18/2021 CLINICAL DATA:  Shortness of breath EXAM: CHEST - 2 VIEW COMPARISON:  03/15/2021 FINDINGS: Enlargement of cardiac silhouette with pulmonary vascular congestion. Question small hiatal hernia. Minimal LEFT basilar atelectasis and small bibasilar effusions. No acute infiltrate or pneumothorax. Osseous demineralization with BILATERAL shoulder prostheses. IMPRESSION: Enlargement of cardiac silhouette with pulmonary vascular congestion. Small BIBASILAR effusions and minimal LEFT basilar atelectasis. Electronically Signed   By: Lavonia Dana M.D.   On: 03/18/2021 13:27   DG Chest 2 View  Result Date: 03/15/2021 CLINICAL DATA:  Cough for 2 days. EXAM: CHEST - 2 VIEW COMPARISON:  01/06/2021 and older studies. FINDINGS: Cardiac silhouette is mildly enlarged. No mediastinal or hilar masses. No evidence of adenopathy. Linear opacity noted in the left mid lung consistent with atelectasis. Mild opacity in the lung bases also consistent with atelectasis. Remainder of the lungs is clear. No convincing pleural effusion and no pneumothorax. 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 No acute skeletal abnormality. IMPRESSION: 1. No acute cardiopulmonary disease. Mild lung base and left mid lung linear opacities consistent with atelectasis. No convincing pneumonia and no pulmonary edema. Electronically Signed   By: Lajean Manes M.D.   On: 03/15/2021 17:01   ECHOCARDIOGRAM COMPLETE  Result  Date: 03/16/2021    ECHOCARDIOGRAM REPORT   Patient Name:   DAMYAH GUGEL Date of Exam: 03/16/2021 Medical Rec #:  [id number]       Height:       60.0 in Accession #:    [id number]      Weight:       155.0 lb Date of Birth:  [birth year]      BSA:          1.675 m Patient Age:    85 years        BP:           148/108 mmHg Patient Gender: F  HR:           100 bpm. Exam Location:  Inpatient Procedure: 2D Echo, Cardiac Doppler and Color Doppler Indications:    Atrial Fibrillation  History:        Patient has no prior history of Echocardiogram examinations.                 Arrythmias:Atrial Fibrillation. Flu positive, states never in                 atrial fibrillation til now. Followed by [facility]                 cardiologist.  Sonographer:    Merrie Roof RDCS Referring Phys: [place]  1. Left ventricular ejection fraction, by estimation, is 40 to 45%. The left ventricle has mildly decreased function. The left ventricle demonstrates global hypokinesis. Left ventricular diastolic parameters are indeterminate.  2. Right ventricular systolic function is mildly reduced. The right ventricular size is normal. There is moderately elevated pulmonary artery systolic pressure. The estimated right ventricular systolic pressure is 32.2 mmHg.  3. Left atrial size was moderately dilated.  4. Right atrial size was severely dilated.  5. The mitral valve is normal in structure. Mild to moderate mitral valve regurgitation.  6. Tricuspid valve regurgitation is moderate.  7. The aortic valve is tricuspid. There is severe calcifcation of the aortic valve. Aortic valve regurgitation is mild. Moderate aortic valve stenosis. Gradients consistent with mild AS (MG 25mHg, Vmax 2.2 m/s), but moderate by AVA (1.0cm^2) and DI (0.32). Low SV index, suspect low flow low gradient moderate AS  8. The inferior vena cava is dilated in size with <50% respiratory variability, suggesting right atrial  pressure of 15 mmHg. FINDINGS  Left Ventricle: Left ventricular ejection fraction, by estimation, is 40 to 45%. The left ventricle has mildly decreased function. The left ventricle demonstrates global hypokinesis. The left ventricular internal cavity size was normal in size. There is  no left ventricular hypertrophy. Left ventricular diastolic parameters are indeterminate. Right Ventricle: The right ventricular size is normal. Right vetricular wall thickness was not well visualized. Right ventricular systolic function is mildly reduced. There is moderately elevated pulmonary artery systolic pressure. The tricuspid regurgitant velocity is 2.82 m/s, and with an assumed right atrial pressure of 15 mmHg, the estimated right ventricular systolic pressure is 402.5mmHg. Left Atrium: Left atrial size was moderately dilated. Right Atrium: Right atrial size was severely dilated. Pericardium: There is no evidence of pericardial effusion. Mitral Valve: The mitral valve is normal in structure. Mild to moderate mitral valve regurgitation. Tricuspid Valve: The tricuspid valve is normal in structure. Tricuspid valve regurgitation is moderate. Aortic Valve: The aortic valve is tricuspid. There is severe calcifcation of the aortic valve. Aortic valve regurgitation is mild. Moderate aortic stenosis is present. Aortic valve mean gradient measures 11.0 mmHg. Aortic valve peak gradient measures 19.2 mmHg. Aortic valve area, by VTI measures 0.99 cm. Pulmonic Valve: The pulmonic valve was not well visualized. Pulmonic valve regurgitation is not visualized. Aorta: The aortic root is normal in size and structure. Venous: The inferior vena cava is dilated in size with less than 50% respiratory variability, suggesting right atrial pressure of 15 mmHg. IAS/Shunts: The interatrial septum was not well visualized.  LEFT VENTRICLE PLAX 2D LVIDd:         4.50 cm LVIDs:         3.30 cm LV PW:  0.90 cm LV IVS:        0.90 cm LVOT diam:      2.00 cm LV SV:         41 LV SV Index:   24 LVOT Area:     3.14 cm  RIGHT VENTRICLE             IVC RV Basal diam:  4.00 cm     IVC diam: 2.70 cm RV S prime:     10.20 cm/s TAPSE (M-mode): 1.1 cm LEFT ATRIUM             Index        RIGHT ATRIUM           Index LA diam:        4.30 cm 2.57 cm/m   RA Area:     33.40 cm LA Vol (A2C):   93.5 ml 55.82 ml/m  RA Volume:   122.00 ml 72.84 ml/m LA Vol (A4C):   66.1 ml 39.46 ml/m LA Biplane Vol: 80.6 ml 48.12 ml/m  AORTIC VALVE AV Area (Vmax):    1.07 cm AV Area (Vmean):   0.98 cm AV Area (VTI):     0.99 cm AV Vmax:           219.00 cm/s AV Vmean:          154.000 cm/s AV VTI:            0.410 m AV Peak Grad:      19.2 mmHg AV Mean Grad:      11.0 mmHg LVOT Vmax:         74.50 cm/s LVOT Vmean:        47.800 cm/s LVOT VTI:          0.129 m LVOT/AV VTI ratio: 0.31  AORTA Ao Root diam: 3.20 cm TRICUSPID VALVE TR Peak grad:   31.8 mmHg TR Vmax:        282.00 cm/s  SHUNTS Systemic VTI:  0.13 m Systemic Diam: 2.00 cm Oswaldo Milian MD Electronically signed by Oswaldo Milian MD Signature Date/Time: 03/16/2021/1:47:48 PM    Final    (Echo, Carotid, EGD, Colonoscopy, ERCP)    Subjective:  Patient is resting in bed she lives alone but her granddaughter is going to stay with her for few days. Discharge Exam: Vitals:   03/21/21 0529 03/21/21 0948  BP: (!) 139/96 134/84  Pulse: 95 79  Resp: 16   Temp: 98.3 F (36.8 C)   SpO2: 96%    Vitals:   03/20/21 1048 03/20/21 2114 03/21/21 0529 03/21/21 0948  BP: (!) 141/90 126/69 (!) 139/96 134/84  Pulse: 91 84 95 79  Resp:  16 16   Temp:  98.8 F (37.1 C) 98.3 F (36.8 C)   TempSrc:  Oral Oral   SpO2:  97% 96%   Weight:      Height:        General: Pt is alert, awake, not in acute distress Cardiovascular: Irregularly irregular S1/S2 +, no rubs, no gallops Respiratory: CTA bilaterally, no wheezing, no rhonchi Abdominal: Soft, NT, ND, bowel sounds + Extremities: no edema, no  cyanosis    The results of significant diagnostics from this hospitalization (including imaging, microbiology, ancillary and laboratory) are listed below for reference.     Microbiology: Recent Results (from the past 240 hour(s))  Resp Panel by RT-PCR (Flu A&B, Covid) Nasopharyngeal Swab     Status: Abnormal   Collection Time: 03/15/21  3:49 PM  Specimen: Nasopharyngeal Swab; Nasopharyngeal(NP) swabs in vial transport medium  Result Value Ref Range Status   SARS Coronavirus 2 by RT PCR NEGATIVE NEGATIVE Final    Comment: (NOTE) SARS-CoV-2 target nucleic acids are NOT DETECTED.  The SARS-CoV-2 RNA is generally detectable in upper respiratory specimens during the acute phase of infection. The lowest concentration of SARS-CoV-2 viral copies this assay can detect is 138 copies/mL. A negative result does not preclude SARS-Cov-2 infection and should not be used as the sole basis for treatment or other patient management decisions. A negative result may occur with  improper specimen collection/handling, submission of specimen other than nasopharyngeal swab, presence of viral mutation(s) within the areas targeted by this assay, and inadequate number of viral copies(<138 copies/mL). A negative result must be combined with clinical observations, patient history, and epidemiological information. The expected result is Negative.  Fact Sheet for Patients:  EntrepreneurPulse.com.au  Fact Sheet for Healthcare Providers:  IncredibleEmployment.be  This test is no t yet approved or cleared by the [place] FDA and  has been authorized for detection and/or diagnosis of SARS-CoV-2 by FDA under an Emergency Use Authorization (EUA). This EUA will remain  in effect (meaning this test can be used) for the duration of the COVID-19 declaration under Section 564(b)(1) of the Act, 21 U.S.C.section 360bbb-3(b)(1), unless the authorization is terminated  or revoked  sooner.       Influenza A by PCR POSITIVE (A) NEGATIVE Final   Influenza B by PCR NEGATIVE NEGATIVE Final    Comment: (NOTE) The Xpert Xpress SARS-CoV-2/FLU/RSV plus assay is intended as an aid in the diagnosis of influenza from Nasopharyngeal swab specimens and should not be used as a sole basis for treatment. Nasal washings and aspirates are unacceptable for Xpert Xpress SARS-CoV-2/FLU/RSV testing.  Fact Sheet for Patients: EntrepreneurPulse.com.au  Fact Sheet for Healthcare Providers: IncredibleEmployment.be  This test is not yet approved or cleared by the [place] FDA and has been authorized for detection and/or diagnosis of SARS-CoV-2 by FDA under an Emergency Use Authorization (EUA). This EUA will remain in effect (meaning this test can be used) for the duration of the COVID-19 declaration under Section 564(b)(1) of the Act, 21 U.S.C. section 360bbb-3(b)(1), unless the authorization is terminated or revoked.  Performed at [facility], [address] [place], [place] [address]   Culture, blood (x 2)     Status: None   Collection Time: 03/15/21 11:30 PM   Specimen: BLOOD  Result Value Ref Range Status   Specimen Description   Final    BLOOD RIGHT WRIST Performed at [facility] [address], [place], [place] [address]    Special Requests   Final    BOTTLES DRAWN AEROBIC ONLY Blood Culture adequate volume Performed at [facility] [address] [place], [place] [address]    Culture   Final    NO GROWTH 5 DAYS Performed at [facility], [place] [address] [place], [place] [address]    Report Status 03/21/2021 FINAL  Final  Culture, blood (x 2)     Status: None   Collection Time: 03/15/21 11:30 PM   Specimen: BLOOD  Result Value Ref Range Status   Specimen Description   Final    BLOOD BLOOD LEFT FOREARM Performed at [facility] [address], [place], [place] [address]    Special Requests   Final    BOTTLES DRAWN AEROBIC ONLY Blood Culture adequate volume Performed  at [facility], [place] [address] [place], [place] [address]    Culture   Final    NO GROWTH 5 DAYS Performed at [facility], [place] [address], [place], [place] [address]    Report Status 03/21/2021 FINAL  Final  MRSA Next Gen by PCR, Nasal     Status: None   Collection Time: 03/16/21 12:56 AM   Specimen: Urine, Clean Catch; Nasal Swab  Result Value Ref Range Status   MRSA by PCR Next Gen NOT DETECTED NOT DETECTED Final    Comment: (NOTE) The GeneXpert MRSA Assay (FDA approved for NASAL specimens only), is one component of a comprehensive MRSA colonization surveillance program. It is not intended to diagnose MRSA infection nor to guide or monitor treatment for MRSA infections. Test performance is not FDA approved in patients less than 64 years old. Performed at [facility], [place] [address] [place], [place] [address]      Labs: BNP (last 3 results) Recent Labs    03/18/21 0409  BNP 998.3*   Basic Metabolic Panel: Recent Labs  Lab 03/15/21 2330 03/16/21 0149 03/17/21 0426 03/19/21 1212 03/20/21 0358 03/21/21 0434  NA  --  130* 135 136 134* 131*  K  --  4.5 4.5 3.7 4.8 4.5  CL  --  100 104 99 98 98  CO2  --  20* 23 27 31 26   GLUCOSE  --  182* 179* 107* 134* 111*  BUN  --  14 22 25* 32* 40*  CREATININE  --  0.96 0.98 1.05* 1.11* 1.28*  CALCIUM  --  9.1 9.3 9.5 9.5 9.2  MG 1.9 1.9  --   --   --   --   PHOS 2.8 2.8  --   --   --   --    Liver Function Tests: Recent Labs  Lab 03/16/21 0149  AST 30  ALT 22  ALKPHOS 58  BILITOT 0.8  PROT 6.3*  ALBUMIN 3.7   No results for input(s): LIPASE, AMYLASE in the last 168 hours. No results for input(s): AMMONIA in the last 168 hours. CBC: Recent Labs  Lab 03/16/21 0149 03/17/21 0426 03/19/21 1212 03/20/21 0358 03/21/21 0434  WBC 2.1* 4.2 6.9 6.4  8.7  NEUTROABS 1.8  --   --   --   --   HGB 10.6* 10.6* 11.5* 11.1* 11.5*  HCT 34.5* 34.0* 36.7 36.2 36.1  MCV 86.9 85.4 85.5 86.2 83.4  PLT 194 211 234 226 254   Cardiac Enzymes: Recent Labs  Lab 03/15/21 2330  CKTOTAL 72   BNP: Invalid input(s): POCBNP CBG: Recent Labs  Lab 03/19/21 1234  GLUCAP 135*   D-Dimer No results for input(s): DDIMER in the last 72 hours. Hgb A1c No results for input(s): HGBA1C in the last 72 hours. Lipid Profile No results for input(s): CHOL, HDL, LDLCALC, TRIG, CHOLHDL, LDLDIRECT in the last 72 hours. Thyroid function studies No results for input(s): TSH, T4TOTAL, T3FREE, THYROIDAB in the last 72 hours.  Invalid input(s): FREET3 Anemia work up No results for input(s): VITAMINB12, FOLATE, FERRITIN, TIBC, IRON, RETICCTPCT in the last 72 hours. Urinalysis    Component Value Date/Time   COLORURINE YELLOW 03/16/2021 0056   APPEARANCEUR HAZY (A) 03/16/2021 0056   LABSPEC 1.009 03/16/2021 0056   PHURINE 5.0 03/16/2021 0056   GLUCOSEU NEGATIVE 03/16/2021 0056   HGBUR NEGATIVE 03/16/2021 0056   BILIRUBINUR NEGATIVE 03/16/2021 0056   KETONESUR 5 (A) 03/16/2021 0056  PROTEINUR NEGATIVE 03/16/2021 0056   UROBILINOGEN 0.2 03/27/2014 2129   NITRITE POSITIVE (A) 03/16/2021 0056   LEUKOCYTESUR NEGATIVE 03/16/2021 0056   Sepsis Labs Invalid input(s): PROCALCITONIN,  WBC,  LACTICIDVEN Microbiology Recent Results (from the past 240 hour(s))  Resp Panel by RT-PCR (Flu A&B, Covid) Nasopharyngeal Swab     Status: Abnormal   Collection Time: 03/15/21  3:49 PM   Specimen: Nasopharyngeal Swab; Nasopharyngeal(NP) swabs in vial transport medium  Result Value Ref Range Status   SARS Coronavirus 2 by RT PCR NEGATIVE NEGATIVE Final    Comment: (NOTE) SARS-CoV-2 target nucleic acids are NOT DETECTED.  The SARS-CoV-2 RNA is generally detectable in upper respiratory specimens during the acute phase of infection. The lowest concentration of SARS-CoV-2 viral  copies this assay can detect is 138 copies/mL. A negative result does not preclude SARS-Cov-2 infection and should not be used as the sole basis for treatment or other patient management decisions. A negative result may occur with  improper specimen collection/handling, submission of specimen other than nasopharyngeal swab, presence of viral mutation(s) within the areas targeted by this assay, and inadequate number of viral copies(<138 copies/mL). A negative result must be combined with clinical observations, patient history, and epidemiological information. The expected result is Negative.  Fact Sheet for Patients:  EntrepreneurPulse.com.au  Fact Sheet for Healthcare Providers:  IncredibleEmployment.be  This test is no t yet approved or cleared by the [place] FDA and  has been authorized for detection and/or diagnosis of SARS-CoV-2 by FDA under an Emergency Use Authorization (EUA). This EUA will remain  in effect (meaning this test can be used) for the duration of the COVID-19 declaration under Section 564(b)(1) of the Act, 21 U.S.C.section 360bbb-3(b)(1), unless the authorization is terminated  or revoked sooner.       Influenza A by PCR POSITIVE (A) NEGATIVE Final   Influenza B by PCR NEGATIVE NEGATIVE Final    Comment: (NOTE) The Xpert Xpress SARS-CoV-2/FLU/RSV plus assay is intended as an aid in the diagnosis of influenza from Nasopharyngeal swab specimens and should not be used as a sole basis for treatment. Nasal washings and aspirates are unacceptable for Xpert Xpress SARS-CoV-2/FLU/RSV testing.  Fact Sheet for Patients: EntrepreneurPulse.com.au  Fact Sheet for Healthcare Providers: IncredibleEmployment.be  This test is not yet approved or cleared by the [place] FDA and has been authorized for detection and/or diagnosis of SARS-CoV-2 by FDA under an Emergency Use Authorization (EUA).  This EUA will remain in effect (meaning this test can be used) for the duration of the COVID-19 declaration under Section 564(b)(1) of the Act, 21 U.S.C. section 360bbb-3(b)(1), unless the authorization is terminated or revoked.  Performed at [facility], [address] [place], [place] [address]   Culture, blood (x 2)     Status: None   Collection Time: 03/15/21 11:30 PM   Specimen: BLOOD  Result Value Ref Range Status   Specimen Description   Final    BLOOD RIGHT WRIST Performed at [place] [facility] [address], [place], [place] [address]    Special Requests   Final    BOTTLES DRAWN AEROBIC ONLY Blood Culture adequate volume Performed at [facility] [address], [place],  [address]    Culture   Final    NO GROWTH 5 DAYS Performed at [facility], [place] [address], [place],  [address]    Report Status 03/21/2021 FINAL  Final  Culture, blood (x 2)  Status: None   Collection Time: 03/15/21 11:30 PM   Specimen: BLOOD  Result Value Ref Range Status   Specimen Description   Final    BLOOD BLOOD LEFT FOREARM Performed at [facility] [address], [place], [place] [address]    Special Requests   Final    BOTTLES DRAWN AEROBIC ONLY Blood Culture adequate volume Performed at [facility] [address] [place], [place] [address]    Culture   Final    NO GROWTH 5 DAYS Performed at [facility], [place] [address] [place], [place] [address]    Report Status 03/21/2021 FINAL  Final  MRSA Next Gen by PCR, Nasal     Status: None   Collection Time: 03/16/21 12:56 AM   Specimen: Urine, Clean Catch; Nasal Swab  Result Value Ref Range Status   MRSA by PCR Next Gen NOT DETECTED NOT DETECTED Final    Comment: (NOTE) The GeneXpert MRSA Assay (FDA approved for NASAL specimens only), is one component of a comprehensive MRSA colonization surveillance program. It is  not intended to diagnose MRSA infection nor to guide or monitor treatment for MRSA infections. Test performance is not FDA approved in patients less than 21 years old. Performed at [facility], [place] [address] [place], [place] [address]      Time coordinating discharge: 39 minutes  SIGNED:   Georgette Shell, MD  [facility] 03/22/2021, 4:32 PM

## 2021-03-26 ENCOUNTER — Telehealth: Payer: Self-pay | Admitting: Cardiology

## 2021-03-26 DIAGNOSIS — R609 Edema, unspecified: Secondary | ICD-10-CM

## 2021-03-26 NOTE — Telephone Encounter (Signed)
Pt c/o swelling: STAT is pt has developed SOB within 24 hours  If swelling, where is the swelling located? Some swelling in the ankles  How much weight have you gained and in what time span? 8.1lbs in a weeks  Have you gained 3 pounds in a day or 5 pounds in a week? yes  Do you have a log of your daily weights (if so, list)?   12/3 146.7  12/4 148.8  12/5 156.6  12/6 151.9  12/7 154.8  Are you currently taking a fluid pill? yes  Are you currently SOB? Only if she walks a distance.   Have you traveled recently? No  She states she feels like a tightness around her chest, she states it feels like she is bloated and heavy.

## 2021-03-26 NOTE — Telephone Encounter (Signed)
Call back to patient who reports increased weight since discharge from hospital last week.  Pt states she has gained 1 to 2 lbs per day since discharge.  While speaking with pt, writer notes shortness of breath pt says comes/goes. Discussed patients symptoms with Dr Harrell Gave, pt instructed as per Dr Harrell Gave to double dose of Lasix to 40 mg x 3 days.  Pt has Lasix 40 mg tabs at home. Pt is to have BMP drawn on Monday 03/31/2021 at [facility] in [place] since that is closer to her home.  Lab orders entered and requisition mailed to patient. Pt aware to contact our office or 911 if shortness of breath or symptoms worsen. Pt verbalized understanding of instructions as provided. Georgana Curio MHA RN CCM

## 2021-04-03 LAB — BASIC METABOLIC PANEL
BUN/Creatinine Ratio: 20 (ref 12–28)
BUN: 24 mg/dL (ref 8–27)
CO2: 23 mmol/L (ref 20–29)
Calcium: 9.5 mg/dL (ref 8.7–10.3)
Chloride: 103 mmol/L (ref 96–106)
Creatinine, Ser: 1.23 mg/dL — ABNORMAL HIGH (ref 0.57–1.00)
Glucose: 95 mg/dL (ref 70–99)
Potassium: 4.2 mmol/L (ref 3.5–5.2)
Sodium: 142 mmol/L (ref 134–144)
eGFR: 44 mL/min/{1.73_m2} — ABNORMAL LOW (ref >59)

## 2021-04-08 ENCOUNTER — Ambulatory Visit (INDEPENDENT_AMBULATORY_CARE_PROVIDER_SITE_OTHER): Payer: Medicare Other | Admitting: Cardiology

## 2021-04-08 ENCOUNTER — Encounter (HOSPITAL_BASED_OUTPATIENT_CLINIC_OR_DEPARTMENT_OTHER): Payer: Self-pay | Admitting: Cardiology

## 2021-04-08 ENCOUNTER — Other Ambulatory Visit: Payer: Self-pay

## 2021-04-08 VITALS — BP 144/84 | HR 86 | Ht 60.0 in | Wt 162.6 lb

## 2021-04-08 DIAGNOSIS — I4819 Other persistent atrial fibrillation: Secondary | ICD-10-CM

## 2021-04-08 DIAGNOSIS — I517 Cardiomegaly: Secondary | ICD-10-CM

## 2021-04-08 DIAGNOSIS — I2510 Atherosclerotic heart disease of native coronary artery without angina pectoris: Secondary | ICD-10-CM

## 2021-04-08 DIAGNOSIS — Z7901 Long term (current) use of anticoagulants: Secondary | ICD-10-CM

## 2021-04-08 DIAGNOSIS — Z01812 Encounter for preprocedural laboratory examination: Secondary | ICD-10-CM

## 2021-04-08 DIAGNOSIS — I5022 Chronic systolic (congestive) heart failure: Secondary | ICD-10-CM | POA: Diagnosis not present

## 2021-04-08 DIAGNOSIS — D6869 Other thrombophilia: Secondary | ICD-10-CM

## 2021-04-08 LAB — BASIC METABOLIC PANEL
BUN/Creatinine Ratio: 22 (ref 12–28)
BUN: 28 mg/dL — ABNORMAL HIGH (ref 8–27)
CO2: 24 mmol/L (ref 20–29)
Calcium: 10.1 mg/dL (ref 8.7–10.3)
Chloride: 101 mmol/L (ref 96–106)
Creatinine, Ser: 1.28 mg/dL — ABNORMAL HIGH (ref 0.57–1.00)
Glucose: 99 mg/dL (ref 70–99)
Potassium: 4.5 mmol/L (ref 3.5–5.2)
Sodium: 138 mmol/L (ref 134–144)
eGFR: 42 mL/min/{1.73_m2} — ABNORMAL LOW (ref >59)

## 2021-04-08 LAB — CBC
Hematocrit: 35.1 % (ref 34.0–46.6)
Hemoglobin: 11 g/dL — ABNORMAL LOW (ref 11.1–15.9)
MCH: 26.4 pg — ABNORMAL LOW (ref 26.6–33.0)
MCHC: 31.3 g/dL — ABNORMAL LOW (ref 31.5–35.7)
MCV: 84 fL (ref 79–97)
Platelets: 246 10*3/uL (ref 150–450)
RBC: 4.17 x10E6/uL (ref 3.77–5.28)
RDW: 16.6 % — ABNORMAL HIGH (ref 11.7–15.4)
WBC: 5 10*3/uL (ref 3.4–10.8)

## 2021-04-08 MED ORDER — METOPROLOL SUCCINATE ER 200 MG PO TB24: 200.0000 mg | ORAL_TABLET | Freq: Every day | ORAL | 3 refills | Status: DC

## 2021-04-08 MED ORDER — FUROSEMIDE 40 MG PO TABS: 40.0000 mg | ORAL_TABLET | Freq: Every day | ORAL | 3 refills | Status: DC

## 2021-04-08 MED ORDER — APIXABAN 5 MG PO TABS: 5.0000 mg | ORAL_TABLET | Freq: Two times a day (BID) | ORAL | 3 refills | Status: DC

## 2021-04-08 NOTE — H&P (View-Only) (Signed)
Cardiology Office Note:    Date:  04/08/2021   ID:  Amy Hickman, DOB 1935/06/16, MRN [id number]  PCP:  Drosinis, Pamalee Leyden, PA-C  Cardiologist:  Buford Dresser, MD  Referring MD: Drosinis, Pamalee Leyden, PA-C   CC: follow up  History of Present Illness:    Amy Hickman is a 85 y.o. female with a hx of hypertension, OSA on CPAP, CAD, CKD, GERD, COPD, and celiac disease who is seen for follow up today. I met her during her admission 03/17/21 for the evaluation and management of atrial fibrillation.  She was seen in the ED 03/15/21 for sepsis in the setting of influenza A. EKG at this visit showed Afib with RVR and rate 106 bpm and she was treated with Cardizem drip. During her hospital stay, she developed new onset atrial fibrillation and was referred to cardiology.  Today: She is accompanied by her daughter in-person and her son was contacted via FaceTime. She is doing well. However, she reports episodes of extreme fatigue that she describes as "crashing and burning". For example, walking 56f uphill to her mailbox causes her to feel fatigued. After resting for a moment, she will fall asleep quickly. Of note, her daughter reports she is more active in church activities which may contribute to her fatigue.   Since coming home from the hospital, she noticed her weight has increased from 146 lbs to 157 lbs. Her baseline weight is 150 lbs. She also notices increased swelling in her bilateral legs. She took 25mLasix this morning. However, on 2069mshe does not notice the fluid decreasing. She does notice increased urinary frequency and decreased fluid retention on 70m67msix.   He son recently bought her an Apple watch that monitors her heart rate. When her rate increases, she endorses shortness of breath.   Of note, she used to take 7.5mg 64mCoumadin for 38 years.  She denies any chest pain, lightheadedness, headaches, syncope, orthopnea, hematuria, or PND.  Past Medical History:   Diagnosis Date   Celiac disease    GERD (gastroesophageal reflux disease)    HOH (hard of hearing)    Hypertension    Interstitial cystitis     Past Surgical History:  Procedure Laterality Date   ABDOMINAL HYSTERECTOMY     APPENDECTOMY     CHOLECYSTECTOMY     ear drum surgery     REPLACEMENT TOTAL KNEE     ROTATOR CUFF REPAIR Right    TONSILLECTOMY      Current Medications: Current Outpatient Medications on File Prior to Visit  Medication Sig   acetaminophen (TYLENOL) 500 MG tablet Take 1 tablet (500 mg total) by mouth every 6 (six) hours as needed for pain.   atorvastatin (LIPITOR) 10 MG tablet Take 1 tablet (10 mg total) by mouth daily.   B Complex Vitamins (VITAMIN B COMPLEX) TABS Take 1 tablet by mouth daily.   benzonatate (TESSALON PERLES) 100 MG capsule Take 1 capsule (100 mg total) by mouth 3 (three) times daily as needed for cough. (Patient taking differently: Take 200 mg by mouth 3 (three) times daily as needed for cough.)   clidinium-chlordiazePOXIDE (LIBRAX) 5-2.5 MG capsule Take 1 capsule by mouth daily as needed. cramping   co-enzyme Q-10 30 MG capsule Take 30 mg by mouth daily.   colchicine 0.6 MG tablet Take 0.6 mg by mouth daily as needed (for gout flareup).   Cranberry 400 MG CAPS Take 400 mg by mouth daily.   diclofenac Sodium (VOLTAREN) 1 %  GEL Apply 2 g topically 4 (four) times daily.   diltiazem (CARDIZEM CD) 360 MG 24 hr capsule Take 1 capsule (360 mg total) by mouth daily.   esomeprazole (NEXIUM) 40 MG capsule Take 40 mg by mouth daily before breakfast.   estradiol (ESTRACE) 0.1 MG/GM vaginal cream Place 1 Applicatorful vaginally once a week.   latanoprost (XALATAN) 0.005 % ophthalmic solution 1 drop at bedtime.   levothyroxine (SYNTHROID) 75 MCG tablet Take 75 mcg by mouth daily.    pentosan polysulfate (ELMIRON) 100 MG capsule Take 100 mg by mouth 2 (two) times daily.   pilocarpine (PILOCAR) 1 % ophthalmic solution Place 1 drop into the left eye 3  (three) times daily.   sodium chloride (OCEAN) 0.65 % SOLN nasal spray Place 1 spray into both nostrils as needed for congestion.   Turmeric 500 MG CAPS Take 500 mg by mouth daily.   WIXELA INHUB 250-50 MCG/ACT AEPB Inhale 2 puffs into the lungs at bedtime.   No current facility-administered medications on file prior to visit.     Allergies:   Ativan [lorazepam], Augmentin [amoxicillin-pot clavulanate], Avelox [moxifloxacin], Chlorzoxazone, Ciprofloxacin hcl, Crestor [rosuvastatin], Gluten meal, Nitrofuran derivatives, Phenergan [promethazine hcl], Codeine, Darvon [propoxyphene hcl], and Doxycycline   Social History   Tobacco Use   Smoking status: Former    Types: Cigarettes   Smokeless tobacco: Never  Substance Use Topics   Alcohol use: Yes    Comment: rare   Drug use: No    Family History: family history includes Hypertension in an other family member.  ROS:   Please see the history of present illness.  Additional pertinent ROS: (+) Fatigue (+) Weight fluctuations (+) Bilateral LE edema (R>L) (+) Palpitations (+) Shortness of breath  EKGs/Labs/Other Studies Reviewed:    The following studies were reviewed today: Echo 03/16/21 1. Left ventricular ejection fraction, by estimation, is 40 to 45%. The  left ventricle has mildly decreased function. The left ventricle  demonstrates global hypokinesis. Left ventricular diastolic parameters are  indeterminate.   2. Right ventricular systolic function is mildly reduced. The right  ventricular size is normal. There is moderately elevated pulmonary artery  systolic pressure. The estimated right ventricular systolic pressure is  94.1 mmHg.   3. Left atrial size was moderately dilated.   4. Right atrial size was severely dilated.   5. The mitral valve is normal in structure. Mild to moderate mitral valve  regurgitation.   6. Tricuspid valve regurgitation is moderate.   7. The aortic valve is tricuspid. There is severe  calcifcation of the  aortic valve. Aortic valve regurgitation is mild. Moderate aortic valve  stenosis. Gradients consistent with mild AS (MG 60mHg, Vmax 2.2 m/s), but  moderate by AVA (1.0cm^2) and DI  (0.32). Low SV index, suspect low flow low gradient moderate AS   8. The inferior vena cava is dilated in size with <50% respiratory  variability, suggesting right atrial pressure of 15 mmHg.   EKG:  EKG is personally reviewed.   04/08/21: atrial fibrillation at 86 bpm  Recent Labs: 03/16/2021: ALT 22; Magnesium 1.9; TSH 0.589 03/18/2021: B Natriuretic Peptide 653.4 03/21/2021: Hemoglobin 11.5; Platelets 254 04/02/2021: BUN 24; Creatinine, Ser 1.23; Potassium 4.2; Sodium 142  Recent Lipid Panel No results found for: CHOL, TRIG, HDL, CHOLHDL, VLDL, LDLCALC, LDLDIRECT  Physical Exam:    VS:  BP (!) 144/84 (BP Location: Left Arm, Patient Position: Sitting, Cuff Size: Normal)    Pulse 86    Ht 5' (1.524 m)  Wt 162 lb 9.6 oz (73.8 kg)    BMI 31.76 kg/m     Wt Readings from Last 3 Encounters:  04/08/21 162 lb 9.6 oz (73.8 kg)  03/15/21 155 lb (70.3 kg)  01/22/19 149 lb (67.6 kg)    GEN: Well nourished, well developed in no acute distress HEENT: Normal, moist mucous membranes NECK: No JVD CARDIAC: Irregularly irregular, normal S1 and S2, no rubs or gallops. No murmur. VASCULAR: Radial and DP pulses 2+ bilaterally. No carotid bruits RESPIRATORY:  No rales or wheezing;  slight rales at bilateral bases ABDOMEN: Soft, non-tender, non-distended MUSCULOSKELETAL:  Ambulates independently SKIN: Warm and dry, mild bilateral edema (R>L) NEUROLOGIC:  Alert and oriented x 3. No focal neuro deficits noted. PSYCHIATRIC:  Normal affect    ASSESSMENT:    1. Persistent atrial fibrillation ([place])   2. Chronic systolic CHF (congestive heart failure) ([place])   3. Biatrial enlargement   4. Coronary artery disease involving native coronary artery of native heart without angina pectoris   5. Secondary  hypercoagulable state ([place])   6. Long term current use of anticoagulant   7. Pre-procedure lab exam    PLAN:    Persistent atrial fibrillation Chronic systolic and diastolic heart failure Nonischemic cardiomyopathy -we discussed options at length today. After shared decision making, will plan for cardioversion -Shared Decision Making/Informed Consent The risks (stroke, cardiac arrhythmias rarely resulting in the need for a temporary or permanent pacemaker, skin irritation or burns and complications associated with conscious sedation including aspiration, arrhythmia, respiratory failure and death), benefits (restoration of normal sinus rhythm) and alternatives of a direct current cardioversion were explained in detail to Ms. Hapke and she agrees to proceed.  -labs ordered today, ECG shows afib at 86 bpm -EF 40-45%. Recent cath with nonobstructive CAD -consolidate metoprolol -has rales on exam, will start lasix  -would prefer not to use diltiazem given low EF, has only PRN for rapid heart rates -CHA2DS2/VAS Stroke Risk Points=8   Points Metrics  1 Has Congestive Heart Failure:  Yes   1 Has Vascular Disease:  Yes   1 Has Hypertension:  Yes   2 Age:  87   0 Has Diabetes:  No   2 Had Stroke:  No  Had TIA:  No  Had Thromboembolism:  Yes   1 Female:  Yes    -continue apixaban 5 mg BID, no missed doses  CAD, nonobstructive, without angina -given age, continue apixaban and no aspirin -continue atorvastatin  Cardiac risk counseling and prevention recommendations: -recommend heart healthy/Mediterranean diet, with whole grains, fruits, vegetable, fish, lean meats, nuts, and olive oil. Limit salt. -recommend moderate walking, 3-5 times/week for 30-50 minutes each session. Aim for at least 150 minutes.week. Goal should be pace of 3 miles/hours, or walking 1.5 miles in 30 minutes -recommend avoidance of tobacco products. Avoid excess alcohol. -ASCVD risk score: The ASCVD Risk score (Arnett  DK, et al., 2019) failed to calculate for the following reasons:   The 2019 ASCVD risk score is only valid for ages 7 to 65    Plan for follow up: 1 to 2 weeks after cardioversion  Buford Dresser, MD, PhD, [facility]    Medication Adjustments/Labs and Tests Ordered: Current medicines are reviewed at length with the patient today.  Concerns regarding medicines are outlined above.  Orders Placed This Encounter  Procedures   EKG 12-Lead   Meds ordered this encounter  Medications   apixaban (ELIQUIS) 5 MG TABS tablet  Sig: Take 1 tablet (5 mg total) by mouth 2 (two) times daily.    Dispense:  180 tablet    Refill:  3   furosemide (LASIX) 40 MG tablet    Sig: Take 1 tablet (40 mg total) by mouth daily.    Dispense:  90 tablet    Refill:  3   metoprolol succinate (TOPROL-XL) 200 MG 24 hr tablet    Sig: Take 1 tablet (200 mg total) by mouth daily. Take with or immediately following a meal.    Dispense:  90 tablet    Refill:  3    Patient Instructions  Medication Instructions:  STOP: Metoprolol tartrate  START: Metoprolol Succinate 200 mg daily INCREASE: Lasix to 40 mg daily  *If you need a refill on your cardiac medications before your next appointment, please call your pharmacy*   Lab Work: Your provider has recommended lab work today (CBC, BMP). Please have this collected at [facility] at [place]. The lab is open 8:00 am - 4:30 pm. Please avoid 12:00p - 1:00p for lunch hour. You do not need an appointment. Please go to [address] [place], [place] [address]. This is in the Primary Care office on the 3rd floor, let them know you are there for blood work and they will direct you to the lab.  If you have labs (blood work) drawn today and your tests are completely normal, you will receive your results only by: [facility] (if you have MyChart) OR A paper copy in the mail If you have any lab test that is abnormal or  we need to change your treatment, we will call you to review the results.   Testing/Procedures: [facility]   Follow-Up: At [facility], you and your health needs are our priority.  As part of our continuing mission to provide you with exceptional heart care, we have created designated Provider Care Teams.  These Care Teams include your primary Cardiologist (physician) and Advanced Practice Providers (APPs -  Physician Assistants and Nurse Practitioners) who all work together to provide you with the care you need, when you need it.  We recommend signing up for the patient portal called "MyChart".  Sign up information is provided on this After Visit Summary.  MyChart is used to connect with patients for Virtual Visits (Telemedicine).  Patients are able to view lab/test results, encounter notes, upcoming appointments, etc.  Non-urgent messages can be sent to your provider as well.   To learn more about what you can do with MyChart, go to [email or web address].    Your next appointment:   2 week(s) after cardioversion on 04/24/21  The format for your next appointment:   In Person  Provider:   Buford Dresser, MD   You are scheduled for a Cardioversion on Thursday 04/24/21 with Dr. Marlou Porch.  Please arrive at the [facility] (Main Entrance A) at [facility]: [address] [place], [place] [address] at 11:30 am  DIET: Nothing to eat or drink after midnight except a sip of water with medications (see medication instructions below)  FYI: For your safety, and to allow [place] to monitor your vital signs accurately during the surgery/procedure we request that   if you have artificial nails, gel coating, SNS etc. Please have those removed prior to your surgery/procedure. Not having the nail coverings /polish removed may result in cancellation or delay of your surgery/procedure.   Medication Instructions: Hold Lasix morning of procedure  Continue your  anticoagulant: Eliquis You will need to continue your anticoagulant after your procedure until you  are told by your  Provider that it is safe to stop   Labs: Today (CBC, BMP)  You must have a responsible person to drive you home and stay in the waiting area during your procedure. Failure to do so could result in cancellation.  Bring your insurance cards.  *Special Note: Every effort is made to have your procedure done on time. Occasionally there are emergencies that occur at the hospital that may cause delays. Please be patient if a delay does occur.      I,Mykaella Javier,acting as a Education administrator for [employer], MD.,have documented all relevant documentation on the behalf of Buford Dresser, MD,as directed by  Buford Dresser, MD while in the presence of Buford Dresser, MD.  I, Buford Dresser, MD, have reviewed all documentation for this visit. The documentation on 04/24/21 for the exam, diagnosis, procedures, and orders are all accurate and complete.   Signed, Buford Dresser, MD PhD 04/08/2021    [place]

## 2021-04-08 NOTE — Progress Notes (Signed)
Cardiology Office Note:    Date:  04/08/2021   ID:  Amy Hickman, DOB 1935/06/16, MRN [id number]  PCP:  Drosinis, Pamalee Leyden, PA-C  Cardiologist:  Buford Dresser, MD  Referring MD: Drosinis, Pamalee Leyden, PA-C   CC: follow up  History of Present Illness:    Amy Hickman is a 85 y.o. female with a hx of hypertension, OSA on CPAP, CAD, CKD, GERD, COPD, and celiac disease who is seen for follow up today. I met her during her admission 03/17/21 for the evaluation and management of atrial fibrillation.  She was seen in the ED 03/15/21 for sepsis in the setting of influenza A. EKG at this visit showed Afib with RVR and rate 106 bpm and she was treated with Cardizem drip. During her hospital stay, she developed new onset atrial fibrillation and was referred to cardiology.  Today: She is accompanied by her daughter in-person and her son was contacted via FaceTime. She is doing well. However, she reports episodes of extreme fatigue that she describes as "crashing and burning". For example, walking 56f uphill to her mailbox causes her to feel fatigued. After resting for a moment, she will fall asleep quickly. Of note, her daughter reports she is more active in church activities which may contribute to her fatigue.   Since coming home from the hospital, she noticed her weight has increased from 146 lbs to 157 lbs. Her baseline weight is 150 lbs. She also notices increased swelling in her bilateral legs. She took 25mLasix this morning. However, on 2069mshe does not notice the fluid decreasing. She does notice increased urinary frequency and decreased fluid retention on 70m67msix.   He son recently bought her an Apple watch that monitors her heart rate. When her rate increases, she endorses shortness of breath.   Of note, she used to take 7.5mg 64mCoumadin for 38 years.  She denies any chest pain, lightheadedness, headaches, syncope, orthopnea, hematuria, or PND.  Past Medical History:   Diagnosis Date   Celiac disease    GERD (gastroesophageal reflux disease)    HOH (hard of hearing)    Hypertension    Interstitial cystitis     Past Surgical History:  Procedure Laterality Date   ABDOMINAL HYSTERECTOMY     APPENDECTOMY     CHOLECYSTECTOMY     ear drum surgery     REPLACEMENT TOTAL KNEE     ROTATOR CUFF REPAIR Right    TONSILLECTOMY      Current Medications: Current Outpatient Medications on File Prior to Visit  Medication Sig   acetaminophen (TYLENOL) 500 MG tablet Take 1 tablet (500 mg total) by mouth every 6 (six) hours as needed for pain.   atorvastatin (LIPITOR) 10 MG tablet Take 1 tablet (10 mg total) by mouth daily.   B Complex Vitamins (VITAMIN B COMPLEX) TABS Take 1 tablet by mouth daily.   benzonatate (TESSALON PERLES) 100 MG capsule Take 1 capsule (100 mg total) by mouth 3 (three) times daily as needed for cough. (Patient taking differently: Take 200 mg by mouth 3 (three) times daily as needed for cough.)   clidinium-chlordiazePOXIDE (LIBRAX) 5-2.5 MG capsule Take 1 capsule by mouth daily as needed. cramping   co-enzyme Q-10 30 MG capsule Take 30 mg by mouth daily.   colchicine 0.6 MG tablet Take 0.6 mg by mouth daily as needed (for gout flareup).   Cranberry 400 MG CAPS Take 400 mg by mouth daily.   diclofenac Sodium (VOLTAREN) 1 %  GEL Apply 2 g topically 4 (four) times daily.   diltiazem (CARDIZEM CD) 360 MG 24 hr capsule Take 1 capsule (360 mg total) by mouth daily.   esomeprazole (NEXIUM) 40 MG capsule Take 40 mg by mouth daily before breakfast.   estradiol (ESTRACE) 0.1 MG/GM vaginal cream Place 1 Applicatorful vaginally once a week.   latanoprost (XALATAN) 0.005 % ophthalmic solution 1 drop at bedtime.   levothyroxine (SYNTHROID) 75 MCG tablet Take 75 mcg by mouth daily.    pentosan polysulfate (ELMIRON) 100 MG capsule Take 100 mg by mouth 2 (two) times daily.   pilocarpine (PILOCAR) 1 % ophthalmic solution Place 1 drop into the left eye 3  (three) times daily.   sodium chloride (OCEAN) 0.65 % SOLN nasal spray Place 1 spray into both nostrils as needed for congestion.   Turmeric 500 MG CAPS Take 500 mg by mouth daily.   WIXELA INHUB 250-50 MCG/ACT AEPB Inhale 2 puffs into the lungs at bedtime.   No current facility-administered medications on file prior to visit.     Allergies:   Ativan [lorazepam], Augmentin [amoxicillin-pot clavulanate], Avelox [moxifloxacin], Chlorzoxazone, Ciprofloxacin hcl, Crestor [rosuvastatin], Gluten meal, Nitrofuran derivatives, Phenergan [promethazine hcl], Codeine, Darvon [propoxyphene hcl], and Doxycycline   Social History   Tobacco Use   Smoking status: Former    Types: Cigarettes   Smokeless tobacco: Never  Substance Use Topics   Alcohol use: Yes    Comment: rare   Drug use: No    Family History: family history includes Hypertension in an other family member.  ROS:   Please see the history of present illness.  Additional pertinent ROS: (+) Fatigue (+) Weight fluctuations (+) Bilateral LE edema (R>L) (+) Palpitations (+) Shortness of breath  EKGs/Labs/Other Studies Reviewed:    The following studies were reviewed today: Echo 03/16/21 1. Left ventricular ejection fraction, by estimation, is 40 to 45%. The  left ventricle has mildly decreased function. The left ventricle  demonstrates global hypokinesis. Left ventricular diastolic parameters are  indeterminate.   2. Right ventricular systolic function is mildly reduced. The right  ventricular size is normal. There is moderately elevated pulmonary artery  systolic pressure. The estimated right ventricular systolic pressure is  94.1 mmHg.   3. Left atrial size was moderately dilated.   4. Right atrial size was severely dilated.   5. The mitral valve is normal in structure. Mild to moderate mitral valve  regurgitation.   6. Tricuspid valve regurgitation is moderate.   7. The aortic valve is tricuspid. There is severe  calcifcation of the  aortic valve. Aortic valve regurgitation is mild. Moderate aortic valve  stenosis. Gradients consistent with mild AS (MG 60mHg, Vmax 2.2 m/s), but  moderate by AVA (1.0cm^2) and DI  (0.32). Low SV index, suspect low flow low gradient moderate AS   8. The inferior vena cava is dilated in size with <50% respiratory  variability, suggesting right atrial pressure of 15 mmHg.   EKG:  EKG is personally reviewed.   04/08/21: atrial fibrillation at 86 bpm  Recent Labs: 03/16/2021: ALT 22; Magnesium 1.9; TSH 0.589 03/18/2021: B Natriuretic Peptide 653.4 03/21/2021: Hemoglobin 11.5; Platelets 254 04/02/2021: BUN 24; Creatinine, Ser 1.23; Potassium 4.2; Sodium 142  Recent Lipid Panel No results found for: CHOL, TRIG, HDL, CHOLHDL, VLDL, LDLCALC, LDLDIRECT  Physical Exam:    VS:  BP (!) 144/84 (BP Location: Left Arm, Patient Position: Sitting, Cuff Size: Normal)    Pulse 86    Ht 5' (1.524 m)  Wt 162 lb 9.6 oz (73.8 kg)    BMI 31.76 kg/m     Wt Readings from Last 3 Encounters:  04/08/21 162 lb 9.6 oz (73.8 kg)  03/15/21 155 lb (70.3 kg)  01/22/19 149 lb (67.6 kg)    GEN: Well nourished, well developed in no acute distress HEENT: Normal, moist mucous membranes NECK: No JVD CARDIAC: Irregularly irregular, normal S1 and S2, no rubs or gallops. No murmur. VASCULAR: Radial and DP pulses 2+ bilaterally. No carotid bruits RESPIRATORY:  No rales or wheezing;  slight rales at bilateral bases ABDOMEN: Soft, non-tender, non-distended MUSCULOSKELETAL:  Ambulates independently SKIN: Warm and dry, mild bilateral edema (R>L) NEUROLOGIC:  Alert and oriented x 3. No focal neuro deficits noted. PSYCHIATRIC:  Normal affect    ASSESSMENT:    1. Persistent atrial fibrillation ([place])   2. Chronic systolic CHF (congestive heart failure) ([place])   3. Biatrial enlargement   4. Coronary artery disease involving native coronary artery of native heart without angina pectoris   5. Secondary  hypercoagulable state ([place])   6. Long term current use of anticoagulant   7. Pre-procedure lab exam    PLAN:    Persistent atrial fibrillation Chronic systolic and diastolic heart failure Nonischemic cardiomyopathy -we discussed options at length today. After shared decision making, will plan for cardioversion -Shared Decision Making/Informed Consent The risks (stroke, cardiac arrhythmias rarely resulting in the need for a temporary or permanent pacemaker, skin irritation or burns and complications associated with conscious sedation including aspiration, arrhythmia, respiratory failure and death), benefits (restoration of normal sinus rhythm) and alternatives of a direct current cardioversion were explained in detail to Amy Hickman and she agrees to proceed.  -labs ordered today, ECG shows afib at 86 bpm -EF 40-45%. Recent cath with nonobstructive CAD -consolidate metoprolol -has rales on exam, will start lasix  -would prefer not to use diltiazem given low EF, has only PRN for rapid heart rates -CHA2DS2/VAS Stroke Risk Points=8   Points Metrics  1 Has Congestive Heart Failure:  Yes   1 Has Vascular Disease:  Yes   1 Has Hypertension:  Yes   2 Age:  87   0 Has Diabetes:  No   2 Had Stroke:  No  Had TIA:  No  Had Thromboembolism:  Yes   1 Female:  Yes    -continue apixaban 5 mg BID, no missed doses  CAD, nonobstructive, without angina -given age, continue apixaban and no aspirin -continue atorvastatin  Cardiac risk counseling and prevention recommendations: -recommend heart healthy/Mediterranean diet, with whole grains, fruits, vegetable, fish, lean meats, nuts, and olive oil. Limit salt. -recommend moderate walking, 3-5 times/week for 30-50 minutes each session. Aim for at least 150 minutes.week. Goal should be pace of 3 miles/hours, or walking 1.5 miles in 30 minutes -recommend avoidance of tobacco products. Avoid excess alcohol. -ASCVD risk score: The ASCVD Risk score (Arnett  DK, et al., 2019) failed to calculate for the following reasons:   The 2019 ASCVD risk score is only valid for ages 7 to 65    Plan for follow up: 1 to 2 weeks after cardioversion  Buford Dresser, MD, PhD, [facility]    Medication Adjustments/Labs and Tests Ordered: Current medicines are reviewed at length with the patient today.  Concerns regarding medicines are outlined above.  Orders Placed This Encounter  Procedures   EKG 12-Lead   Meds ordered this encounter  Medications   apixaban (ELIQUIS) 5 MG TABS tablet  Sig: Take 1 tablet (5 mg total) by mouth 2 (two) times daily.    Dispense:  180 tablet    Refill:  3   furosemide (LASIX) 40 MG tablet    Sig: Take 1 tablet (40 mg total) by mouth daily.    Dispense:  90 tablet    Refill:  3   metoprolol succinate (TOPROL-XL) 200 MG 24 hr tablet    Sig: Take 1 tablet (200 mg total) by mouth daily. Take with or immediately following a meal.    Dispense:  90 tablet    Refill:  3    Patient Instructions  Medication Instructions:  STOP: Metoprolol tartrate  START: Metoprolol Succinate 200 mg daily INCREASE: Lasix to 40 mg daily  *If you need a refill on your cardiac medications before your next appointment, please call your pharmacy*   Lab Work: Your provider has recommended lab work today (CBC, BMP). Please have this collected at [facility] at [place]. The lab is open 8:00 am - 4:30 pm. Please avoid 12:00p - 1:00p for lunch hour. You do not need an appointment. Please go to [address] [place], [place] [address]. This is in the Primary Care office on the 3rd floor, let them know you are there for blood work and they will direct you to the lab.  If you have labs (blood work) drawn today and your tests are completely normal, you will receive your results only by: [place] (if you have MyChart) OR A paper copy in the mail If you have any lab test that is abnormal or  we need to change your treatment, we will call you to review the results.   Testing/Procedures: [facility]   Follow-Up: At [facility], you and your health needs are our priority.  As part of our continuing mission to provide you with exceptional heart care, we have created designated Provider Care Teams.  These Care Teams include your primary Cardiologist (physician) and Advanced Practice Providers (APPs -  Physician Assistants and Nurse Practitioners) who all work together to provide you with the care you need, when you need it.  We recommend signing up for the patient portal called "MyChart".  Sign up information is provided on this After Visit Summary.  MyChart is used to connect with patients for Virtual Visits (Telemedicine).  Patients are able to view lab/test results, encounter notes, upcoming appointments, etc.  Non-urgent messages can be sent to your provider as well.   To learn more about what you can do with MyChart, go to [email or web address].    Your next appointment:   2 week(s) after cardioversion on 04/24/21  The format for your next appointment:   In Person  Provider:   Buford Dresser, MD   You are scheduled for a Cardioversion on Thursday 04/24/21 with Dr. Marlou Porch.  Please arrive at the [facility] (Main Entrance A) at [facility]: [address] [place], [place] [address] at 11:30 am  DIET: Nothing to eat or drink after midnight except a sip of water with medications (see medication instructions below)  FYI: For your safety, and to allow [place] to monitor your vital signs accurately during the surgery/procedure we request that   if you have artificial nails, gel coating, SNS etc. Please have those removed prior to your surgery/procedure. Not having the nail coverings /polish removed may result in cancellation or delay of your surgery/procedure.   Medication Instructions: Hold Lasix morning of procedure  Continue your  anticoagulant: Eliquis You will need to continue your anticoagulant after your procedure until you  are told by your  Provider that it is safe to stop   Labs: Today (CBC, BMP)  You must have a responsible person to drive you home and stay in the waiting area during your procedure. Failure to do so could result in cancellation.  Bring your insurance cards.  *Special Note: Every effort is made to have your procedure done on time. Occasionally there are emergencies that occur at the hospital that may cause delays. Please be patient if a delay does occur.      I,Mykaella Javier,acting as a Education administrator for [employer], MD.,have documented all relevant documentation on the behalf of Buford Dresser, MD,as directed by  Buford Dresser, MD while in the presence of Buford Dresser, MD.  I, Buford Dresser, MD, have reviewed all documentation for this visit. The documentation on 04/24/21 for the exam, diagnosis, procedures, and orders are all accurate and complete.   Signed, Buford Dresser, MD PhD 04/08/2021    [place]

## 2021-04-08 NOTE — Patient Instructions (Signed)
Medication Instructions:  STOP: Metoprolol tartrate  START: Metoprolol Succinate 200 mg daily INCREASE: Lasix to 40 mg daily  *If you need a refill on your cardiac medications before your next appointment, please call your pharmacy*   Lab Work: Your provider has recommended lab work today (CBC, BMP). Please have this collected at [facility] at [place]. The lab is open 8:00 am - 4:30 pm. Please avoid 12:00p - 1:00p for lunch hour. You do not need an appointment. Please go to [address] [place], [place] [address]. This is in the Primary Care office on the 3rd floor, let them know you are there for blood work and they will direct you to the lab.  If you have labs (blood work) drawn today and your tests are completely normal, you will receive your results only by: [place] (if you have MyChart) OR A paper copy in the mail If you have any lab test that is abnormal or we need to change your treatment, we will call you to review the results.   Testing/Procedures: [facility]   Follow-Up: At [facility], you and your health needs are our priority.  As part of our continuing mission to provide you with exceptional heart care, we have created designated Provider Care Teams.  These Care Teams include your primary Cardiologist (physician) and Advanced Practice Providers (APPs -  Physician Assistants and Nurse Practitioners) who all work together to provide you with the care you need, when you need it.  We recommend signing up for the patient portal called "MyChart".  Sign up information is provided on this After Visit Summary.  MyChart is used to connect with patients for Virtual Visits (Telemedicine).  Patients are able to view lab/test results, encounter notes, upcoming appointments, etc.  Non-urgent messages can be sent to your provider as well.   To learn more about what you can do with MyChart, go to [email or web address].    Your  next appointment:   2 week(s) after cardioversion on 04/24/21  The format for your next appointment:   In Person  Provider:   Buford Dresser, MD   You are scheduled for a Cardioversion on Thursday 04/24/21 with Dr. Marlou Porch.  Please arrive at the [facility] (Main Entrance A) at [facility]: [address] [place], [place] [address] at 11:30 am  DIET: Nothing to eat or drink after midnight except a sip of water with medications (see medication instructions below)  FYI: For your safety, and to allow [place] to monitor your vital signs accurately during the surgery/procedure we request that   if you have artificial nails, gel coating, SNS etc. Please have those removed prior to your surgery/procedure. Not having the nail coverings /polish removed may result in cancellation or delay of your surgery/procedure.   Medication Instructions: Hold Lasix morning of procedure  Continue your anticoagulant: Eliquis You will need to continue your anticoagulant after your procedure until you  are told by your  Provider that it is safe to stop   Labs: Today (CBC, BMP)  You must have a responsible person to drive you home and stay in the waiting area during your procedure. Failure to do so could result in cancellation.  Bring your insurance cards.  *Special Note: Every effort is made to have your procedure done on time. Occasionally there are emergencies that occur at the hospital that may cause delays. Please be patient if a delay does occur.

## 2021-04-11 ENCOUNTER — Encounter (HOSPITAL_COMMUNITY): Payer: Self-pay | Admitting: Cardiology

## 2021-04-11 NOTE — Progress Notes (Signed)
Attempted to obtain medical history via telephone, unable to reach at this time. I left a voicemail to return pre surgical testing department's phone call.  

## 2021-04-24 ENCOUNTER — Encounter (HOSPITAL_COMMUNITY): Payer: Self-pay | Admitting: Cardiology

## 2021-04-24 ENCOUNTER — Ambulatory Visit (HOSPITAL_COMMUNITY): Payer: Medicare Other | Admitting: Certified Registered Nurse Anesthetist

## 2021-04-24 ENCOUNTER — Other Ambulatory Visit: Payer: Self-pay

## 2021-04-24 ENCOUNTER — Ambulatory Visit (HOSPITAL_COMMUNITY)
Admission: RE | Admit: 2021-04-24 | Discharge: 2021-04-24 | Disposition: A | Discharge status: Home / Self Care | Payer: Medicare Other | Attending: Cardiology | Admitting: Cardiology

## 2021-04-24 ENCOUNTER — Encounter (HOSPITAL_COMMUNITY)
Admission: RE | Disposition: A | Discharge status: Home / Self Care | Payer: Self-pay | Source: Home / Self Care | Attending: Cardiology

## 2021-04-24 DIAGNOSIS — I130 Hypertensive heart and chronic kidney disease with heart failure and stage 1 through stage 4 chronic kidney disease, or unspecified chronic kidney disease: Secondary | ICD-10-CM | POA: Insufficient documentation

## 2021-04-24 DIAGNOSIS — E039 Hypothyroidism, unspecified: Secondary | ICD-10-CM | POA: Diagnosis not present

## 2021-04-24 DIAGNOSIS — I2510 Atherosclerotic heart disease of native coronary artery without angina pectoris: Secondary | ICD-10-CM | POA: Insufficient documentation

## 2021-04-24 DIAGNOSIS — D6869 Other thrombophilia: Secondary | ICD-10-CM | POA: Insufficient documentation

## 2021-04-24 DIAGNOSIS — I428 Other cardiomyopathies: Secondary | ICD-10-CM | POA: Diagnosis not present

## 2021-04-24 DIAGNOSIS — J449 Chronic obstructive pulmonary disease, unspecified: Secondary | ICD-10-CM | POA: Diagnosis not present

## 2021-04-24 DIAGNOSIS — G4733 Obstructive sleep apnea (adult) (pediatric): Secondary | ICD-10-CM | POA: Insufficient documentation

## 2021-04-24 DIAGNOSIS — I4891 Unspecified atrial fibrillation: Secondary | ICD-10-CM

## 2021-04-24 DIAGNOSIS — Z79899 Other long term (current) drug therapy: Secondary | ICD-10-CM | POA: Insufficient documentation

## 2021-04-24 DIAGNOSIS — K219 Gastro-esophageal reflux disease without esophagitis: Secondary | ICD-10-CM | POA: Insufficient documentation

## 2021-04-24 DIAGNOSIS — N189 Chronic kidney disease, unspecified: Secondary | ICD-10-CM | POA: Diagnosis not present

## 2021-04-24 DIAGNOSIS — Z87891 Personal history of nicotine dependence: Secondary | ICD-10-CM | POA: Insufficient documentation

## 2021-04-24 DIAGNOSIS — I4819 Other persistent atrial fibrillation: Secondary | ICD-10-CM | POA: Insufficient documentation

## 2021-04-24 DIAGNOSIS — I5042 Chronic combined systolic (congestive) and diastolic (congestive) heart failure: Secondary | ICD-10-CM | POA: Insufficient documentation

## 2021-04-24 DIAGNOSIS — K900 Celiac disease: Secondary | ICD-10-CM | POA: Insufficient documentation

## 2021-04-24 HISTORY — PX: CARDIOVERSION: SHX1299

## 2021-04-24 LAB — POCT I-STAT, CHEM 8
BUN: 33 mg/dL — ABNORMAL HIGH (ref 8–23)
Calcium, Ion: 1.33 mmol/L (ref 1.15–1.40)
Chloride: 105 mmol/L (ref 98–111)
Creatinine, Ser: 1.2 mg/dL — ABNORMAL HIGH (ref 0.44–1.00)
Glucose, Bld: 101 mg/dL — ABNORMAL HIGH (ref 70–99)
HCT: 35 % — ABNORMAL LOW (ref 36.0–46.0)
Hemoglobin: 11.9 g/dL — ABNORMAL LOW (ref 12.0–15.0)
Potassium: 3.8 mmol/L (ref 3.5–5.1)
Sodium: 139 mmol/L (ref 135–145)
TCO2: 25 mmol/L (ref 22–32)

## 2021-04-24 SURGERY — CARDIOVERSION
Anesthesia: General

## 2021-04-24 MED ORDER — PROPOFOL 10 MG/ML IV BOLUS
INTRAVENOUS | Status: DC | PRN
  Administered 2021-04-24: 50 mg via INTRAVENOUS

## 2021-04-24 MED ORDER — SODIUM CHLORIDE 0.9 % IV SOLN: INTRAVENOUS | Status: DC

## 2021-04-24 MED ORDER — LIDOCAINE 2% (20 MG/ML) 5 ML SYRINGE
INTRAMUSCULAR | Status: DC | PRN
  Administered 2021-04-24: 20 mg via INTRAVENOUS

## 2021-04-24 NOTE — CV Procedure (Signed)
° ° °  Electrical Cardioversion Procedure Note Amy Hickman [id number] Feb 12, 1936  Procedure: Electrical Cardioversion Indications:  Atrial Fibrillation  Time Out: Verified patient identification, verified procedure,medications/allergies/relevent history reviewed, required imaging and test results available.  Performed  Procedure Details  The patient was NPO after midnight. Anesthesia was administered at the beside  by Dr.R Ola Spurr with  propofol.  Cardioversion was performed with synchronized biphasic defibrillation via AP pads with 120, 200, 200 joules.  3 attempt(s) were performed.  The patient converted to normal sinus rhythm. The patient tolerated the procedure well   IMPRESSION:  Successful cardioversion of atrial fibrillation    Amy Hickman 04/24/2021, 1:19 PM

## 2021-04-24 NOTE — Transfer of Care (Signed)
Immediate Anesthesia Transfer of Care Note  Patient: Amy Hickman  Procedure(s) Performed: CARDIOVERSION  Patient Location: Endoscopy Unit  Anesthesia Type:General  Level of Consciousness: drowsy  Airway & Oxygen Therapy: Patient Spontanous Breathing  Post-op Assessment: Report given to RN and Post -op Vital signs reviewed and stable  Post vital signs: Reviewed and stable  Last Vitals:  Vitals Value Taken Time  BP 129/71   Temp    Pulse 87   Resp 15   SpO2 95     Last Pain:  Vitals:   04/24/21 1133  TempSrc: Temporal         Complications: No notable events documented.

## 2021-04-24 NOTE — Anesthesia Procedure Notes (Signed)
Procedure Name: General with mask airway Date/Time: 04/24/2021 1:04 PM Performed by: Carolan Clines, CRNA Pre-anesthesia Checklist: Patient identified, Emergency Drugs available, Suction available and Patient being monitored Patient Re-evaluated:Patient Re-evaluated prior to induction Oxygen Delivery Method: Ambu bag Preoxygenation: Pre-oxygenation with 100% oxygen Induction Type: IV induction Dental Injury: Teeth and Oropharynx as per pre-operative assessment

## 2021-04-24 NOTE — Discharge Instructions (Signed)

## 2021-04-24 NOTE — Anesthesia Postprocedure Evaluation (Signed)
Anesthesia Post Note  Patient: Amy Hickman  Procedure(s) Performed: CARDIOVERSION     Patient location during evaluation: PACU Anesthesia Type: General Level of consciousness: awake and alert Pain management: pain level controlled Vital Signs Assessment: post-procedure vital signs reviewed and stable Respiratory status: spontaneous breathing, nonlabored ventilation, respiratory function stable and patient connected to nasal cannula oxygen Cardiovascular status: blood pressure returned to baseline and stable Postop Assessment: no apparent nausea or vomiting Anesthetic complications: no   No notable events documented.  Last Vitals:  Vitals:   04/24/21 1330 04/24/21 1340  BP: (!) 142/71 140/76  Pulse: 86 89  Resp: 18 (!) 26  Temp:    SpO2: 95% 96%    Last Pain:  Vitals:   04/24/21 1340  TempSrc:   PainSc: 0-No pain                 Amy Hickman

## 2021-04-24 NOTE — Anesthesia Preprocedure Evaluation (Signed)
Anesthesia Evaluation  Patient identified by MRN, date of birth, ID band Patient awake    Reviewed: Allergy & Precautions, H&P , NPO status , Patient's Chart, lab work & pertinent test results  Airway Mallampati: II   Neck ROM: full    Dental   Pulmonary COPD, former smoker,    breath sounds clear to auscultation       Cardiovascular hypertension, +CHF  + dysrhythmias Atrial Fibrillation  Rhythm:irregular Rate:Normal     Neuro/Psych    GI/Hepatic GERD  ,  Endo/Other  Hypothyroidism   Renal/GU      Musculoskeletal   Abdominal   Peds  Hematology   Anesthesia Other Findings   Reproductive/Obstetrics                             Anesthesia Physical Anesthesia Plan  ASA: 3  Anesthesia Plan: General   Post-op Pain Management:    Induction: Intravenous  PONV Risk Score and Plan: 3 and Propofol infusion and Treatment may vary due to age or medical condition  Airway Management Planned: Mask  Additional Equipment:   Intra-op Plan:   Post-operative Plan:   Informed Consent: I have reviewed the patients History and Physical, chart, labs and discussed the procedure including the risks, benefits and alternatives for the proposed anesthesia with the patient or authorized representative who has indicated his/her understanding and acceptance.     Dental advisory given  Plan Discussed with: CRNA, Anesthesiologist and Surgeon  Anesthesia Plan Comments:         Anesthesia Quick Evaluation

## 2021-04-25 NOTE — Interval H&P Note (Signed)
History and Physical Interval Note:  04/25/2021 6:34 AM  Amy Hickman  has presented today for surgery, with the diagnosis of A-FIB.  The various methods of treatment have been discussed with the patient and family. After consideration of risks, benefits and other options for treatment, the patient has consented to  Procedure(s): CARDIOVERSION (N/A) as a surgical intervention.  The patient's history has been reviewed, patient examined, no change in status, stable for surgery.  I have reviewed the patient's chart and labs.  Questions were answered to the patient's satisfaction.     [employer]

## 2021-04-28 ENCOUNTER — Encounter (HOSPITAL_COMMUNITY): Payer: Self-pay | Admitting: Cardiology

## 2021-04-28 ENCOUNTER — Telehealth (HOSPITAL_BASED_OUTPATIENT_CLINIC_OR_DEPARTMENT_OTHER): Payer: Self-pay | Admitting: Cardiology

## 2021-04-28 NOTE — Telephone Encounter (Signed)
Pt c/o medication issue:  1. Name of Medication:  apixaban (ELIQUIS) 5 MG TABS tablet metoprolol succinate (TOPROL-XL) 200 MG 24 hr tablet diltiazem (CARDIZEM CD) 360 MG 24 hr capsule  2. How are you currently taking this medication (dosage and times per day)? As directed  3. Are you having a reaction (difficulty breathing--STAT)? itching  4. What is your medication issue? Patient said it feels like she has an internal itching all over her skin. She wanted to know if it could be as a result of changing her medications   She would also like someone to go over the limitations of what she can and can not do following her Cardioversion that she had 04/08/21

## 2021-04-28 NOTE — Telephone Encounter (Signed)
Please advise 

## 2021-05-08 NOTE — Addendum Note (Signed)
Addendum  created 05/08/21 2008 by Suzette Battiest, MD   Intraprocedure Staff edited

## 2021-05-08 NOTE — Telephone Encounter (Signed)
Has visit 1/20 to discuss

## 2021-05-08 NOTE — Telephone Encounter (Signed)
Patient has upcoming visit on 1/20 and will discuss with MD at that visit

## 2021-05-09 ENCOUNTER — Other Ambulatory Visit: Payer: Self-pay

## 2021-05-09 ENCOUNTER — Encounter (HOSPITAL_BASED_OUTPATIENT_CLINIC_OR_DEPARTMENT_OTHER): Payer: Self-pay | Admitting: Cardiology

## 2021-05-09 ENCOUNTER — Ambulatory Visit (INDEPENDENT_AMBULATORY_CARE_PROVIDER_SITE_OTHER): Payer: Medicare Other | Admitting: Cardiology

## 2021-05-09 VITALS — BP 132/78 | HR 83 | Ht 60.0 in | Wt 157.0 lb

## 2021-05-09 DIAGNOSIS — I5042 Chronic combined systolic (congestive) and diastolic (congestive) heart failure: Secondary | ICD-10-CM | POA: Diagnosis not present

## 2021-05-09 DIAGNOSIS — I2510 Atherosclerotic heart disease of native coronary artery without angina pectoris: Secondary | ICD-10-CM | POA: Diagnosis not present

## 2021-05-09 DIAGNOSIS — I517 Cardiomegaly: Secondary | ICD-10-CM | POA: Diagnosis not present

## 2021-05-09 DIAGNOSIS — Z7901 Long term (current) use of anticoagulants: Secondary | ICD-10-CM

## 2021-05-09 DIAGNOSIS — D6869 Other thrombophilia: Secondary | ICD-10-CM

## 2021-05-09 DIAGNOSIS — I4819 Other persistent atrial fibrillation: Secondary | ICD-10-CM

## 2021-05-09 DIAGNOSIS — I872 Venous insufficiency (chronic) (peripheral): Secondary | ICD-10-CM

## 2021-05-09 MED ORDER — AMIODARONE HCL 200 MG PO TABS: ORAL_TABLET | ORAL | 0 refills | Status: DC

## 2021-05-09 NOTE — Patient Instructions (Signed)
Medication Instructions:  1.) Start amiodarone 200 mg twice a day for two weeks, then decrease to 200 mg once a day.   *If you need a refill on your cardiac medications before your next appointment, please call your pharmacy*   Lab Work: None ordered today   Testing/Procedures: None ordered today   Follow-Up: At [facility], you and your health needs are our priority.  As part of our continuing mission to provide you with exceptional heart care, we have created designated Provider Care Teams.  These Care Teams include your primary Cardiologist (physician) and Advanced Practice Providers (APPs -  Physician Assistants and Nurse Practitioners) who all work together to provide you with the care you need, when you need it.  We recommend signing up for the patient portal called "MyChart".  Sign up information is provided on this After Visit Summary.  MyChart is used to connect with patients for Virtual Visits (Telemedicine).  Patients are able to view lab/test results, encounter notes, upcoming appointments, etc.  Non-urgent messages can be sent to your provider as well.   To learn more about what you can do with MyChart, go to [email or web address].    Your next appointment:   3 week(s)  The format for your next appointment:   In Person  Provider:   Buford Dresser, MD

## 2021-05-09 NOTE — Progress Notes (Signed)
Cardiology Office Note:    Date:  05/09/2021   ID:  Amy Hickman, DOB 12/12/1935, MRN [id number]  PCP:  [facility]  Cardiologist:  Buford Dresser, MD  Referring MD: Drosinis, Pamalee Leyden, PA-C   CC: follow up  History of Present Illness:    Amy Hickman is a 86 y.o. female with a hx of persistent atrial fibrillation, hypertension, OSA on CPAP, CAD, CKD, GERD, COPD, and celiac disease who is seen for follow up today. I met her during her admission 03/17/21 for the evaluation and management of atrial fibrillation.  History: Admission 03/15/21 for sepsis in the setting of influenza A, found to have afib RVR. EF slightly reduced. Difficult to rate control, required both metoprolol and diltiazem. Cardioversion 04/24/2021 got her into sinus rhythm, but she then returned to atrial fibrillation.  Today: She is accompanied by her daughter. Following her cardioversion, she was feeling better and her daughter noticed that she seemed more alert. Currently, she is feeling fatigued. She suspected that she returned to atrial fibrillation about 1.5 weeks ago following a fit of coughing. Her Apple watch had showed them mixed results. We discussed at length possible treatments including amiodarone, Tikosyn, and ablation.   Additionally, she has developed LE edema associated with weeping and pruritis. She denies having prior issues with weeping. For treatment she does keep her legs elevated when able. However, she has had difficulty with using compression socks due to causing loss of circulation. Her pruritis had developed on her right LE and her back. Initially it was intense, but has significantly improved at this time.  She continues to have shortness of breath especially when climbing stairs. Also, while walking into her church recently she had to stop and rest 4 times. Occasionally she experiences sharp chest pains with breathing, and she will try to breathe  deeply to help relieve this.  At night, she often wakes up multiple times.  She denies any lightheadedness, headaches, syncope, orthopnea, or PND.   Past Medical History:  Diagnosis Date   Celiac disease    GERD (gastroesophageal reflux disease)    HOH (hard of hearing)    Hypertension    Interstitial cystitis     Past Surgical History:  Procedure Laterality Date   ABDOMINAL HYSTERECTOMY     APPENDECTOMY     CARDIOVERSION N/A 04/24/2021   Procedure: CARDIOVERSION;  Surgeon: Jerline Pain, MD;  Location: [facility];  Service: Cardiovascular;  Laterality: N/A;   CHOLECYSTECTOMY     ear drum surgery     REPLACEMENT TOTAL KNEE     ROTATOR CUFF REPAIR Right    TONSILLECTOMY      Current Medications: Current Outpatient Medications on File Prior to Visit  Medication Sig   acetaminophen (TYLENOL) 500 MG tablet Take 1 tablet (500 mg total) by mouth every 6 (six) hours as needed for pain.   apixaban (ELIQUIS) 5 MG TABS tablet Take 1 tablet (5 mg total) by mouth 2 (two) times daily.   atorvastatin (LIPITOR) 10 MG tablet Take 1 tablet (10 mg total) by mouth daily. (Patient taking differently: Take 10 mg by mouth every evening.)   B Complex Vitamins (VITAMIN B COMPLEX) TABS Take 1 tablet by mouth daily.   benzonatate (TESSALON PERLES) 100 MG capsule Take 1 capsule (100 mg total) by mouth 3 (three) times daily as needed for cough. (Patient taking differently: Take 200 mg by mouth 3 (three) times daily as needed for cough.)  clidinium-chlordiazePOXIDE (LIBRAX) 5-2.5 MG capsule Take 1 capsule by mouth daily as needed (cramping).   Coenzyme Q10 (COQ10) 100 MG CAPS Take 100 mg by mouth every evening.   colchicine 0.6 MG tablet Take 0.6 mg by mouth daily as needed (for gout flareup).   CRANBERRY-VITAMIN C PO Take 1 capsule by mouth in the morning.   diclofenac Sodium (VOLTAREN) 1 % GEL Apply 2 g topically 4 (four) times daily. (Patient taking differently: Apply 2 g topically 4 (four) times  daily as needed (pain).)   diltiazem (CARDIZEM CD) 360 MG 24 hr capsule Take 1 capsule (360 mg total) by mouth daily.   esomeprazole (NEXIUM) 40 MG capsule Take 40 mg by mouth daily before breakfast.   estradiol (ESTRACE) 0.1 MG/GM vaginal cream Place 1 Applicatorful vaginally daily as needed (irritation/dryness).   furosemide (LASIX) 40 MG tablet Take 1 tablet (40 mg total) by mouth daily.   latanoprost (XALATAN) 0.005 % ophthalmic solution Place 1 drop into both eyes at bedtime.   levothyroxine (SYNTHROID) 75 MCG tablet Take 75 mcg by mouth daily before breakfast.   metoprolol succinate (TOPROL-XL) 200 MG 24 hr tablet Take 1 tablet (200 mg total) by mouth daily. Take with or immediately following a meal. (Patient taking differently: Take 200 mg by mouth daily with supper. Take with or immediately following a meal.)   pentosan polysulfate (ELMIRON) 100 MG capsule Take 100 mg by mouth 2 (two) times daily.   pilocarpine (PILOCAR) 1 % ophthalmic solution Place 1 drop into the left eye 3 (three) times daily.   Turmeric 500 MG CAPS Take 1,000 mg by mouth every evening.   WIXELA INHUB 250-50 MCG/ACT AEPB Inhale 2 puffs into the lungs at bedtime.   No current facility-administered medications on file prior to visit.     Allergies:   Ativan [lorazepam], Augmentin [amoxicillin-pot clavulanate], Avelox [moxifloxacin], Chlorzoxazone, Ciprofloxacin hcl, Crestor [rosuvastatin], Gluten meal, Nitrofuran derivatives, Phenergan [promethazine hcl], Codeine, Darvon [propoxyphene hcl], and Doxycycline   Social History   Tobacco Use   Smoking status: Former    Types: Cigarettes   Smokeless tobacco: Never  Substance Use Topics   Alcohol use: Yes    Comment: rare   Drug use: No    Family History: family history includes Hypertension in an other family member.  ROS:   Please see the history of present illness. (+) Fatigue (+) Exertional shortness of breath (+) Bilateral LE weeping edema (+)  Pruritis All other systems are reviewed and negative.    EKGs/Labs/Other Studies Reviewed:    The following studies were reviewed today:  CT Chest 04/02/2021 ([place]): COMPARISON:  Chest x-rays from November of 2022.   FINDINGS:  Cardiovascular: Calcified atherosclerosis of the thoracic aorta.  Calcified coronary artery disease, three-vessel coronary artery  disease. No aortic dilation. Heart size moderately enlarged, no  substantial pericardial effusion. Central pulmonary vasculature is  normal caliber. Limited assessment of cardiovascular structures  given lack of intravenous contrast.   Mediastinum/Nodes: Patulous esophagus. No thoracic inlet or axillary  adenopathy though streak artifact from bilateral reverse shoulder  arthroplasties limits assessment in the axillary regions and at the  thoracic inlet. No mediastinal or hilar adenopathy.   Lungs/Pleura: Basilar atelectasis at the LEFT lung base. No  effusion. No consolidation. 3 mm RIGHT middle lobe pulmonary nodule  (image 101/3). Airways are patent.   Upper Abdomen: Postoperative changes about the GE junction with  surgical clips in this area. No acute findings relative to  visualized liver,  pancreas, spleen or very limited visualization of  other upper abdominal structures.   Musculoskeletal: Post bilateral reverse shoulder arthroplasty. No  acute musculoskeletal process. No destructive bone finding. Spinal  degenerative changes.   IMPRESSION:  No acute findings in the chest.   3 mm RIGHT middle lobe pulmonary nodule. No follow-up needed if  patient is low-risk. Non-contrast chest CT can be considered in 12  months if patient is high-risk. This recommendation follows the  consensus statement: Guidelines for Management of Incidental  Pulmonary Nodules Detected on CT Images: From the Fleischner Society  2017; Radiology 2017; 284:228-243.   Three-vessel coronary artery disease.   Postoperative  changes about the GE junction.   Aortic atherosclerosis.   Echo 03/16/21 1. Left ventricular ejection fraction, by estimation, is 40 to 45%. The  left ventricle has mildly decreased function. The left ventricle  demonstrates global hypokinesis. Left ventricular diastolic parameters are  indeterminate.   2. Right ventricular systolic function is mildly reduced. The right  ventricular size is normal. There is moderately elevated pulmonary artery  systolic pressure. The estimated right ventricular systolic pressure is  63.3 mmHg.   3. Left atrial size was moderately dilated.   4. Right atrial size was severely dilated.   5. The mitral valve is normal in structure. Mild to moderate mitral valve  regurgitation.   6. Tricuspid valve regurgitation is moderate.   7. The aortic valve is tricuspid. There is severe calcifcation of the  aortic valve. Aortic valve regurgitation is mild. Moderate aortic valve  stenosis. Gradients consistent with mild AS (MG 65mHg, Vmax 2.2 m/s), but  moderate by AVA (1.0cm^2) and DI  (0.32). Low SV index, suspect low flow low gradient moderate AS   8. The inferior vena cava is dilated in size with <50% respiratory  variability, suggesting right atrial pressure of 15 mmHg.   EKG:  EKG is personally reviewed.   05/09/2021: atrial fibrillation at 83 bpm 04/08/21: atrial fibrillation at 86 bpm  Recent Labs: 03/16/2021: ALT 22; Magnesium 1.9; TSH 0.589 03/18/2021: B Natriuretic Peptide 653.4 04/08/2021: Platelets 246 04/24/2021: BUN 33; Creatinine, Ser 1.20; Hemoglobin 11.9; Potassium 3.8; Sodium 139   Recent Lipid Panel No results found for: CHOL, TRIG, HDL, CHOLHDL, VLDL, LDLCALC, LDLDIRECT  Physical Exam:    VS:  BP 132/78 (BP Location: Right Arm, Patient Position: Sitting, Cuff Size: Normal)    Pulse 83    Ht 5' (1.524 m)    Wt 157 lb (71.2 kg)    BMI 30.66 kg/m     Wt Readings from Last 3 Encounters:  05/09/21 157 lb (71.2 kg)  04/24/21 158 lb (71.7 kg)   04/08/21 162 lb 9.6 oz (73.8 kg)    GEN: Well nourished, well developed in no acute distress HEENT: Normal, moist mucous membranes NECK: No JVD CARDIAC: Irregularly irregular, normal S1 and S2, no rubs or gallops. No murmur. VASCULAR: Radial and DP pulses 2+ bilaterally. No carotid bruits RESPIRATORY:  Clear to ausculation bilaterally, no rales or wheezing ABDOMEN: Soft, non-tender, non-distended MUSCULOSKELETAL:  Ambulates independently SKIN: Warm. Bilateral LE edema, R>L. On the right anterior ankle/low shin, there is slight erythema. No drainage at this time. Not warm to touch. 1+ pitting on the right ankle, trivial on the left. Prominent varicose veins on both legs, bilateral knee arthroplasty scars NEUROLOGIC:  Alert and oriented x 3. No focal neuro deficits noted. PSYCHIATRIC:  Normal affect    ASSESSMENT:    1. [place] ([place]   2. Chronic  combined systolic and diastolic heart failure ([place])   3. Biatrial enlargement   4. Secondary hypercoagulable state ([place])   5. Long term current use of anticoagulant   6. Coronary artery disease involving native coronary artery of native heart without angina pectoris   7. Venous insufficiency     PLAN:    Persistent atrial fibrillation Chronic systolic and diastolic heart failure Nonischemic cardiomyopathy -EF 40-45%. Recent cath with nonobstructive CAD -consolidated metoprolol to succinate at max dose -would prefer not to use diltiazem given low EF, but was not rate controlled on metoprolol alone. Will continue diltiazem for now. -CHA2DS2/VAS Stroke Risk Points=8  -continue apixaban 5 mg BID, no missed doses  We had a prolonged discussion about management options for her atrial fibrillation. She is symptomatic, and she has EF 40-45%. We discussed tikosyn admission vs amiodarone vs ablation. Did discuss that she has some atrial enlargement on her echo. Doing well on apixaban, no issues with bleeding. Her husband was  on amiodarone but also had many other health issues. We discussed side effects/risks of amiodarone extensively, as well as discussing tikosyn and ablation. Given her response to prior cardioversion, I do not think she will hold sinus rhythm without antiarrhythmic.   She would like to proceed with trial of amiodarone. Will load with 200 mg BID for two weeks, then 200 mg daily. Will follow up in 3 weeks. If still in afib, will schedule repeat cardioversion.  Bilateral LE edema, R >L -itching improved, but was localized to site of swelling -has history of vein stripping as well as bilateral knee arthroplasty, predisposing her to venous insufficiency -she is otherwise euvolemic on exam today -we discussed venous insufficiency at length. Discussed compression, elevation -she will monitor for heat, worsening erythema, or exudate on the right leg, which would be concerning for infection.  CAD, nonobstructive, without angina -given age, continue apixaban and no aspirin -continue atorvastatin  Cardiac risk counseling and prevention recommendations: -recommend heart healthy/Mediterranean diet, with whole grains, fruits, vegetable, fish, lean meats, nuts, and olive oil. Limit salt. -recommend moderate walking, 3-5 times/week for 30-50 minutes each session. Aim for at least 150 minutes.week. Goal should be pace of 3 miles/hours, or walking 1.5 miles in 30 minutes -recommend avoidance of tobacco products. Avoid excess alcohol. -ASCVD risk score: The ASCVD Risk score (Arnett DK, et al., 2019) failed to calculate for the following reasons:   The 2019 ASCVD risk score is only valid for ages 80 to 71    Plan for follow up: 3 weeks or sooner as needed.  Total time of encounter: 52 minutes total time of encounter, including 41 minutes spent in face-to-face patient care. This time includes coordination of care and counseling regarding options for management of atrial fibrillation. Remainder of non-face-to-face  time involved reviewing chart documents/testing relevant to the patient encounter and documentation in the medical record.  Buford Dresser, MD, PhD, [facility]    Medication Adjustments/Labs and Tests Ordered: Current medicines are reviewed at length with the patient today.  Concerns regarding medicines are outlined above.   Orders Placed This Encounter  Procedures   EKG 12-Lead   Meds ordered this encounter  Medications   amiodarone (PACERONE) 200 MG tablet    Sig: Take 1 tablet (200 mg total) by mouth 2 (two) times daily for 14 days, THEN 1 tablet (200 mg total) daily for 14 days.    Dispense:  42 tablet    Refill:  0   Patient  Instructions  Medication Instructions:  1.) Start amiodarone 200 mg twice a day for two weeks, then decrease to 200 mg once a day.   *If you need a refill on your cardiac medications before your next appointment, please call your pharmacy*   Lab Work: None ordered today   Testing/Procedures: None ordered today   Follow-Up: At [facility], you and your health needs are our priority.  As part of our continuing mission to provide you with exceptional heart care, we have created designated Provider Care Teams.  These Care Teams include your primary Cardiologist (physician) and Advanced Practice Providers (APPs -  Physician Assistants and Nurse Practitioners) who all work together to provide you with the care you need, when you need it.  We recommend signing up for the patient portal called "MyChart".  Sign up information is provided on this After Visit Summary.  MyChart is used to connect with patients for Virtual Visits (Telemedicine).  Patients are able to view lab/test results, encounter notes, upcoming appointments, etc.  Non-urgent messages can be sent to your provider as well.   To learn more about what you can do with MyChart, go to [email or web address].    Your next appointment:   3 week(s)  The format for  your next appointment:   In Person  Provider:   Buford Dresser, MD      [facility] Stumpf,acting as a scribe for Buford Dresser, MD.,have documented all relevant documentation on the behalf of Buford Dresser, MD,as directed by  Buford Dresser, MD while in the presence of Buford Dresser, MD.  I, Buford Dresser, MD, have reviewed all documentation for this visit. The documentation on 05/09/21 for the exam, diagnosis, procedures, and orders are all accurate and complete.   Signed, Buford Dresser, MD PhD 05/09/2021    [place]

## 2021-05-18 ENCOUNTER — Telehealth: Payer: Self-pay | Admitting: Physician Assistant

## 2021-05-18 NOTE — Telephone Encounter (Signed)
Called by the patient's daughter regarding her fall today.  Patient has a history of atrial fibrillation previously underwent unsuccessful cardioversion on 04/24/2021.  Patient was last seen by Dr. Buford Dresser on 05/09/2021 at which time amiodarone was started in anticipation of cardioversion again in the future.  Unfortunately patient suffered a fall this morning and required 3 stitches on her scalp.  She was instructed by ED physician to hold blood thinner for couple of days however also instructed her to contact cardiology service to make sure it is okay from our perspective.  I instructed the daughter to restart Eliquis this Wednesday which is February 1.  She is aware that the longer she hold her Eliquis the more likely a stroke will occur.  However in this case, bleeding risk outweighed the potential possibility of stroke.  If she does require cardioversion in the future, we will keep in mind that she had a temporary interruption of anticoagulation therapy.

## 2021-05-27 ENCOUNTER — Ambulatory Visit (HOSPITAL_BASED_OUTPATIENT_CLINIC_OR_DEPARTMENT_OTHER): Payer: Medicare Other | Admitting: Cardiology

## 2021-05-30 ENCOUNTER — Other Ambulatory Visit: Payer: Self-pay

## 2021-05-30 ENCOUNTER — Encounter (HOSPITAL_BASED_OUTPATIENT_CLINIC_OR_DEPARTMENT_OTHER): Payer: Self-pay | Admitting: Cardiology

## 2021-05-30 ENCOUNTER — Ambulatory Visit (INDEPENDENT_AMBULATORY_CARE_PROVIDER_SITE_OTHER): Payer: Medicare Other | Admitting: Cardiology

## 2021-05-30 VITALS — BP 152/90 | HR 77 | Ht 60.0 in | Wt 156.1 lb

## 2021-05-30 DIAGNOSIS — I483 Typical atrial flutter: Secondary | ICD-10-CM

## 2021-05-30 DIAGNOSIS — Z01818 Encounter for other preprocedural examination: Secondary | ICD-10-CM

## 2021-05-30 DIAGNOSIS — Z7901 Long term (current) use of anticoagulants: Secondary | ICD-10-CM

## 2021-05-30 DIAGNOSIS — I872 Venous insufficiency (chronic) (peripheral): Secondary | ICD-10-CM | POA: Insufficient documentation

## 2021-05-30 DIAGNOSIS — Z01812 Encounter for preprocedural laboratory examination: Secondary | ICD-10-CM

## 2021-05-30 DIAGNOSIS — I4819 Other persistent atrial fibrillation: Secondary | ICD-10-CM | POA: Diagnosis not present

## 2021-05-30 DIAGNOSIS — D6869 Other thrombophilia: Secondary | ICD-10-CM

## 2021-05-30 DIAGNOSIS — I2510 Atherosclerotic heart disease of native coronary artery without angina pectoris: Secondary | ICD-10-CM | POA: Diagnosis not present

## 2021-05-30 DIAGNOSIS — I428 Other cardiomyopathies: Secondary | ICD-10-CM

## 2021-05-30 MED ORDER — AMIODARONE HCL 200 MG PO TABS: 200.0000 mg | ORAL_TABLET | Freq: Every day | ORAL | 3 refills | Status: DC

## 2021-05-30 NOTE — H&P (View-Only) (Signed)
Cardiology Office Note:    Date:  05/30/2021   ID:  Amy Hickman, DOB [birth year], MRN [id number]  PCP:  [facility]  Cardiologist:  Buford Dresser, MD  Referring MD: [facility]*   CC: follow up  History of Present Illness:    Amy Hickman is a 86 y.o. female with a hx of persistent atrial fibrillation, hypertension, OSA on CPAP, CAD, CKD, GERD, COPD, and celiac disease who is seen for follow up today. I met her during her admission 03/17/21 for the evaluation and management of atrial fibrillation.  History: Admission 03/15/21 for sepsis in the setting of influenza A, found to have afib RVR. EF slightly reduced. Difficult to rate control, required both metoprolol and diltiazem. Cardioversion 04/24/2021 got her into sinus rhythm, but she then returned to atrial fibrillation.  Ms. Amy Hickman's daughter called the office 05/18/2021 and reported that she had a fall that morning and required 3 stitches on her scalp. She was told by ED in [facility] to hold her blood thinner for a few days as the bleeding risk outweighed her risk of stroke. We advised her to restart Eliquis on 05/21/21. If she does require cardioversion in the future, we will keep in mind that she had a temporary interruption of anticoagulation therapy.  Today: She is accompanied by her daughter. Overall, she is feeling better. The fracture in her foot has healed at this time.  Her blood pressure is elevated in clinic today. She attributes this to some stress at home. She recently had a mechanical fall and injured her face. She held her apixaban for two days, restarted 12/1.   Yesterday, she took several naps throughout the day and notes falling asleep very quickly. She believes she may be more fatigued than she feels.  Currently she takes amiodarone once daily at night.  She denies any palpitations, chest pain, or shortness of breath. No lightheadedness, headaches, syncope,  orthopnea, or PND.  We discussed options, including cardioversion, today.  Past Medical History:  Diagnosis Date   Celiac disease    GERD (gastroesophageal reflux disease)    HOH (hard of hearing)    Hypertension    Interstitial cystitis     Past Surgical History:  Procedure Laterality Date   ABDOMINAL HYSTERECTOMY     APPENDECTOMY     CARDIOVERSION N/A 04/24/2021   Procedure: CARDIOVERSION;  Surgeon: Jerline Pain, MD;  Location: [facility];  Service: Cardiovascular;  Laterality: N/A;   CHOLECYSTECTOMY     ear drum surgery     REPLACEMENT TOTAL KNEE     ROTATOR CUFF REPAIR Right    TONSILLECTOMY      Current Medications: Current Outpatient Medications on File Prior to Visit  Medication Sig   acetaminophen (TYLENOL) 500 MG tablet Take 1 tablet (500 mg total) by mouth every 6 (six) hours as needed for pain.   apixaban (ELIQUIS) 5 MG TABS tablet Take 1 tablet (5 mg total) by mouth 2 (two) times daily.   atorvastatin (LIPITOR) 10 MG tablet Take 1 tablet (10 mg total) by mouth daily. (Patient taking differently: Take 10 mg by mouth every evening.)   B Complex Vitamins (VITAMIN B COMPLEX) TABS Take 1 tablet by mouth daily.   benzonatate (TESSALON PERLES) 100 MG capsule Take 1 capsule (100 mg total) by mouth 3 (three) times daily as needed for cough. (Patient taking differently: Take 200 mg by mouth 3 (three) times daily as needed for cough.)  clidinium-chlordiazePOXIDE (LIBRAX) 5-2.5 MG capsule Take 1 capsule by mouth daily as needed (cramping).   Coenzyme Q10 (COQ10) 100 MG CAPS Take 100 mg by mouth every evening.   colchicine 0.6 MG tablet Take 0.6 mg by mouth daily as needed (for gout flareup).   CRANBERRY-VITAMIN C PO Take 1 capsule by mouth in the morning.   diclofenac Sodium (VOLTAREN) 1 % GEL Apply 2 g topically 4 (four) times daily. (Patient taking differently: Apply 2 g topically 4 (four) times daily as needed (pain).)   diltiazem (CARDIZEM CD) 360 MG 24 hr capsule Take 1  capsule (360 mg total) by mouth daily.   esomeprazole (NEXIUM) 40 MG capsule Take 40 mg by mouth daily before breakfast.   estradiol (ESTRACE) 0.1 MG/GM vaginal cream Place 1 Applicatorful vaginally daily as needed (irritation/dryness).   furosemide (LASIX) 40 MG tablet Take 1 tablet (40 mg total) by mouth daily.   Hyoscyamine Sulfate SL 0.125 MG SUBL DISSOLVE 1 TABLET UNDER THE TONGUE EVERY 4 HOURS AS NEEDED FOR CRAMPING   latanoprost (XALATAN) 0.005 % ophthalmic solution Place 1 drop into both eyes at bedtime.   levothyroxine (SYNTHROID) 75 MCG tablet Take 75 mcg by mouth daily before breakfast.   metoprolol succinate (TOPROL-XL) 200 MG 24 hr tablet Take 1 tablet (200 mg total) by mouth daily. Take with or immediately following a meal. (Patient taking differently: Take 200 mg by mouth daily with supper. Take with or immediately following a meal.)   pentosan polysulfate (ELMIRON) 100 MG capsule Take 100 mg by mouth 2 (two) times daily.   pilocarpine (PILOCAR) 1 % ophthalmic solution Place 1 drop into the left eye 3 (three) times daily.   Turmeric 500 MG CAPS Take 1,000 mg by mouth every evening.   Vitamin D, Ergocalciferol, (DRISDOL) 1.25 MG (50000 UNIT) CAPS capsule Take 50,000 Units by mouth once a week.   No current facility-administered medications on file prior to visit.     Allergies:   Ativan [lorazepam], Augmentin [amoxicillin-pot clavulanate], Avelox [moxifloxacin], Chlorzoxazone, Ciprofloxacin hcl, Crestor [rosuvastatin], Gluten meal, Nitrofuran derivatives, Phenergan [promethazine hcl], Codeine, Darvon [propoxyphene hcl], and Doxycycline   Social History   Tobacco Use   Smoking status: Former    Types: Cigarettes   Smokeless tobacco: Never  Substance Use Topics   Alcohol use: Yes    Comment: rare   Drug use: No    Family History: family history includes Hypertension in an other family member.  ROS:   Please see the history of present illness. (+) Stress (+)  Fatigue All other systems are reviewed and negative.    EKGs/Labs/Other Studies Reviewed:    The following studies were reviewed today:  CT Chest 05/18/2021 ([place]): COMPARISON:  04/02/2021   FINDINGS:  Cardiovascular: Heart top-normal in size. Three-vessel coronary  artery calcifications. No pericardial effusion. Aortic  atherosclerosis.   Mediastinum/Nodes: No neck base, mediastinal or hilar masses or  enlarged lymph nodes. Trachea and esophagus are unremarkable.   Lungs/Pleura: No evidence of pulmonary contusion or laceration. No  no lung consolidation to suggest pneumonia and no evidence of  pulmonary edema. Several reticulonodular type opacities, most  evident in the anterior right middle lobe, are stable, benign. There  are no masses or suspicious nodules. Mild paraseptal emphysema is  noted posteriorly, also unchanged.   No pleural effusion or pneumothorax.   Upper Abdomen: No acute abnormality.   Musculoskeletal: No fracture or acute finding. Bilateral  well-positioned shoulder prostheses. No bone lesions. No chest wall  mass or contusion.   IMPRESSION:  1. No acute findings or evidence of injury to the chest. No  fractures.  2. Aortic atherosclerosis and coronary artery calcifications.   Aortic Atherosclerosis (ICD10-I70.0) and Emphysema (ICD10-J43.9).   CT Chest 04/02/2021 ([place]): COMPARISON:  Chest x-rays from November of 2022.   FINDINGS:  Cardiovascular: Calcified atherosclerosis of the thoracic aorta.  Calcified coronary artery disease, three-vessel coronary artery  disease. No aortic dilation. Heart size moderately enlarged, no  substantial pericardial effusion. Central pulmonary vasculature is  normal caliber. Limited assessment of cardiovascular structures  given lack of intravenous contrast.   Mediastinum/Nodes: Patulous esophagus. No thoracic inlet or axillary  adenopathy though streak artifact from  bilateral reverse shoulder  arthroplasties limits assessment in the axillary regions and at the  thoracic inlet. No mediastinal or hilar adenopathy.   Lungs/Pleura: Basilar atelectasis at the LEFT lung base. No  effusion. No consolidation. 3 mm RIGHT middle lobe pulmonary nodule  (image 101/3). Airways are patent.   Upper Abdomen: Postoperative changes about the GE junction with  surgical clips in this area. No acute findings relative to  visualized liver, pancreas, spleen or very limited visualization of  other upper abdominal structures.   Musculoskeletal: Post bilateral reverse shoulder arthroplasty. No  acute musculoskeletal process. No destructive bone finding. Spinal  degenerative changes.   IMPRESSION:  No acute findings in the chest.   3 mm RIGHT middle lobe pulmonary nodule. No follow-up needed if  patient is low-risk. Non-contrast chest CT can be considered in 12  months if patient is high-risk. This recommendation follows the  consensus statement: Guidelines for Management of Incidental  Pulmonary Nodules Detected on CT Images: From the Fleischner Society  2017; Radiology 2017; 284:228-243.   Three-vessel coronary artery disease.   Postoperative changes about the GE junction.   Aortic atherosclerosis.   Echo 03/16/21 1. Left ventricular ejection fraction, by estimation, is 40 to 45%. The  left ventricle has mildly decreased function. The left ventricle  demonstrates global hypokinesis. Left ventricular diastolic parameters are  indeterminate.   2. Right ventricular systolic function is mildly reduced. The right  ventricular size is normal. There is moderately elevated pulmonary artery  systolic pressure. The estimated right ventricular systolic pressure is  33.3 mmHg.   3. Left atrial size was moderately dilated.   4. Right atrial size was severely dilated.   5. The mitral valve is normal in structure. Mild to moderate mitral valve  regurgitation.   6.  Tricuspid valve regurgitation is moderate.   7. The aortic valve is tricuspid. There is severe calcifcation of the  aortic valve. Aortic valve regurgitation is mild. Moderate aortic valve  stenosis. Gradients consistent with mild AS (MG 55mHg, Vmax 2.2 m/s), but moderate by AVA (1.0cm^2) and DI  (0.32). Low SV index, suspect low flow low gradient moderate AS   8. The inferior vena cava is dilated in size with <50% respiratory  variability, suggesting right atrial pressure of 15 mmHg.   EKG:  EKG is personally reviewed.   05/30/2021: atrial flutter at 77 bpm 05/09/2021: atrial fibrillation at 83 bpm 04/08/21: atrial fibrillation at 86 bpm  Recent Labs: 03/16/2021: ALT 22; Magnesium 1.9; TSH 0.589 03/18/2021: B Natriuretic Peptide 653.4 04/08/2021: Platelets 246 04/24/2021: BUN 33; Creatinine, Ser 1.20; Hemoglobin 11.9; Potassium 3.8; Sodium 139   Recent Lipid Panel No results found for: CHOL, TRIG, HDL, CHOLHDL, VLDL, LDLCALC, LDLDIRECT  Physical Exam:    VS:  BP (!) 152/90 (BP Location:  Right Arm, Patient Position: Sitting, Cuff Size: Normal)    Pulse 77    Ht 5' (1.524 m)    Wt 156 lb 1.6 oz (70.8 kg)    BMI 30.49 kg/m     Wt Readings from Last 3 Encounters:  05/30/21 156 lb 1.6 oz (70.8 kg)  05/09/21 157 lb (71.2 kg)  04/24/21 158 lb (71.7 kg)    GEN: Well nourished, well developed in no acute distress HEENT: Normal, moist mucous membranes. Periorbital hematoma superior to right eye. Ecchymosis across right cheek and jaw NECK: No JVD CARDIAC: Irregularly irregular, normal S1 and S2, no rubs or gallops. No murmur. VASCULAR: Radial and DP pulses 2+ bilaterally. No carotid bruits RESPIRATORY:  Clear to ausculation bilaterally, no rales or wheezing ABDOMEN: Soft, non-tender, non-distended MUSCULOSKELETAL:  Ambulates independently SKIN: Warm. Trivial bilateral LE edema. Prominent varicose veins on both legs, bilateral knee arthroplasty scars NEUROLOGIC:  Alert and oriented x 3. No  focal neuro deficits noted. PSYCHIATRIC:  Normal affect    ASSESSMENT:    1. Typical atrial flutter (HCC)   2. Persistent atrial fibrillation ([place])   3. Pre-procedure lab exam   4. Secondary hypercoagulable state ([place])   5. Long term current use of anticoagulant   6. Venous insufficiency   7. Coronary artery disease involving native coronary artery of native heart without angina pectoris   8. NICM (nonischemic cardiomyopathy) ([place])     PLAN:    Persistent atrial fibrillation/now atrial flutter Chronic systolic and diastolic heart failure Nonischemic cardiomyopathy -EF 40-45%. Recent cath with nonobstructive CAD -consolidated metoprolol to succinate at max dose -would prefer not to use diltiazem given low EF, but was not rate controlled on metoprolol alone. Will continue diltiazem for now. -CHA2DS2/VAS Stroke Risk Points=8  -continue apixaban 5 mg BID, no missed doses since 2/1 -tolerating amiodarone, now on once daily dosing -we discussed cardioversion, she is amenable. Labs today, scheduled for 2/23  Shared Decision Making/Informed Consent The risks (stroke, cardiac arrhythmias rarely resulting in the need for a temporary or permanent pacemaker, skin irritation or burns and complications associated with conscious sedation including aspiration, arrhythmia, respiratory failure and death), benefits (restoration of normal sinus rhythm) and alternatives of a direct current cardioversion were explained in detail to Ms. Winslow and she agrees to proceed.   Bilateral LE edema, R >L: only trivial today -has history of vein stripping as well as bilateral knee arthroplasty, predisposing her to venous insufficiency -she is otherwise euvolemic on exam today -we discussed venous insufficiency at length. Discussed compression, elevation -she will monitor for heat, worsening erythema, or exudate on the right leg, which would be concerning for infection.  CAD, nonobstructive, without  angina -given age, continue apixaban and no aspirin -continue atorvastatin  Cardiac risk counseling and prevention recommendations: -recommend heart healthy/Mediterranean diet, with whole grains, fruits, vegetable, fish, lean meats, nuts, and olive oil. Limit salt. -recommend moderate walking, 3-5 times/week for 30-50 minutes each session. Aim for at least 150 minutes.week. Goal should be pace of 3 miles/hours, or walking 1.5 miles in 30 minutes -recommend avoidance of tobacco products. Avoid excess alcohol. -ASCVD risk score: The ASCVD Risk score (Arnett DK, et al., 2019) failed to calculate for the following reasons:   The 2019 ASCVD risk score is only valid for ages 51 to 44    Plan for follow up: 1-2 weeks following cardioversion or sooner as needed.  Buford Dresser, MD, PhD, [facility]    Medication Adjustments/Labs and  Tests Ordered: Current medicines are reviewed at length with the patient today.  Concerns regarding medicines are outlined above.   Orders Placed This Encounter  Procedures   CBC   Basic metabolic panel   EKG 71-IRCV   Meds ordered this encounter  Medications   amiodarone (PACERONE) 200 MG tablet    Sig: Take 1 tablet (200 mg total) by mouth daily.    Dispense:  90 tablet    Refill:  3   Patient Instructions  Medication Instructions:  Your Physician recommend you continue on your current medication as directed.    *If you need a refill on your cardiac medications before your next appointment, please call your pharmacy*   Lab Work: Your provider has recommended lab work today (CBC, BMP). Please have this collected at [facility] at [place]. The lab is open 8:00 am - 4:30 pm. Please avoid 12:00p - 1:00p for lunch hour. You do not need an appointment. Please go to [address] [place], [place] [address]. This is in the Primary Care office on the 3rd floor, let them know you are there for blood work  and they will direct you to the lab.  If you have labs (blood work) drawn today and your tests are completely normal, you will receive your results only by: [place] (if you have MyChart) OR A paper copy in the mail If you have any lab test that is abnormal or we need to change your treatment, we will call you to review the results.   Testing/Procedures: [facility]   Follow-Up: At [facility], you and your health needs are our priority.  As part of our continuing mission to provide you with exceptional heart care, we have created designated Provider Care Teams.  These Care Teams include your primary Cardiologist (physician) and Advanced Practice Providers (APPs -  Physician Assistants and Nurse Practitioners) who all work together to provide you with the care you need, when you need it.  We recommend signing up for the patient portal called "MyChart".  Sign up information is provided on this After Visit Summary.  MyChart is used to connect with patients for Virtual Visits (Telemedicine).  Patients are able to view lab/test results, encounter notes, upcoming appointments, etc.  Non-urgent messages can be sent to your provider as well.   To learn more about what you can do with MyChart, go to [email or web address].    Your next appointment:   2 week(s) after Cardioversion on 2/23  The format for your next appointment:   In Person  Provider:   Buford Dresser, MD    You are scheduled for a Cardioversion on Thursday 06/12/21 with Dr. Harrell Gave.  Please arrive at the [facility] (Main Entrance A) at [facility]: [address] [place], [place] [address] at 11:30 am  DIET: Nothing to eat or drink after midnight except a sip of water with medications (see medication instructions below)  FYI: For your safety, and to allow [place] to monitor your vital signs accurately during the surgery/procedure we request that   if you have artificial nails,  gel coating, SNS etc. Please have those removed prior to your surgery/procedure. Not having the nail coverings /polish removed may result in cancellation or delay of your surgery/procedure.   Medication Instructions:  Continue your anticoagulant: Eliquis You will need to continue your anticoagulant after your procedure until you  are told by your  Provider that it is safe to stop  Labs: Today (CBC, BMP)  You must have a responsible person to drive you home and stay in the waiting area during your procedure. Failure to do so could result in cancellation.  Bring your insurance cards.  *Special Note: Every effort is made to have your procedure done on time. Occasionally there are emergencies that occur at the hospital that may cause delays. Please be patient if a delay does occur.     I,Mathew Stumpf,acting as a Education administrator for [employer], MD.,have documented all relevant documentation on the behalf of Buford Dresser, MD,as directed by  Buford Dresser, MD while in the presence of Buford Dresser, MD.  I, Buford Dresser, MD, have reviewed all documentation for this visit. The documentation on 05/30/21 for the exam, diagnosis, procedures, and orders are all accurate and complete.   Signed, Buford Dresser, MD PhD 05/30/2021    [place]

## 2021-05-30 NOTE — Patient Instructions (Signed)
Medication Instructions:  Your Physician recommend you continue on your current medication as directed.    *If you need a refill on your cardiac medications before your next appointment, please call your pharmacy*   Lab Work: Your provider has recommended lab work today (CBC, BMP). Please have this collected at [facility] at [place]. The lab is open 8:00 am - 4:30 pm. Please avoid 12:00p - 1:00p for lunch hour. You do not need an appointment. Please go to [address] [place], [place] [address]. This is in the Primary Care office on the 3rd floor, let them know you are there for blood work and they will direct you to the lab.  If you have labs (blood work) drawn today and your tests are completely normal, you will receive your results only by: [place] (if you have MyChart) OR A paper copy in the mail If you have any lab test that is abnormal or we need to change your treatment, we will call you to review the results.   Testing/Procedures: [facility]   Follow-Up: At [facility], you and your health needs are our priority.  As part of our continuing mission to provide you with exceptional heart care, we have created designated Provider Care Teams.  These Care Teams include your primary Cardiologist (physician) and Advanced Practice Providers (APPs -  Physician Assistants and Nurse Practitioners) who all work together to provide you with the care you need, when you need it.  We recommend signing up for the patient portal called "MyChart".  Sign up information is provided on this After Visit Summary.  MyChart is used to connect with patients for Virtual Visits (Telemedicine).  Patients are able to view lab/test results, encounter notes, upcoming appointments, etc.  Non-urgent messages can be sent to your provider as well.   To learn more about what you can do with MyChart, go to [email or web address].    Your next appointment:    2 week(s) after Cardioversion on 2/23  The format for your next appointment:   In Person  Provider:   Buford Dresser, MD    You are scheduled for a Cardioversion on Thursday 06/12/21 with Dr. Harrell Gave.  Please arrive at the [facility] (Main Entrance A) at [facility]: [address] [place], [place] [address] at 11:30 am  DIET: Nothing to eat or drink after midnight except a sip of water with medications (see medication instructions below)  FYI: For your safety, and to allow [place] to monitor your vital signs accurately during the surgery/procedure we request that   if you have artificial nails, gel coating, SNS etc. Please have those removed prior to your surgery/procedure. Not having the nail coverings /polish removed may result in cancellation or delay of your surgery/procedure.   Medication Instructions:  Continue your anticoagulant: Eliquis You will need to continue your anticoagulant after your procedure until you  are told by your  Provider that it is safe to stop   Labs: Today (CBC, BMP)  You must have a responsible person to drive you home and stay in the waiting area during your procedure. Failure to do so could result in cancellation.  Bring your insurance cards.  *Special Note: Every effort is made to have your procedure done on time. Occasionally there are emergencies that occur at the hospital that may cause delays. Please be patient if a delay does occur.

## 2021-05-30 NOTE — Progress Notes (Signed)
Cardiology Office Note:    Date:  05/30/2021   ID:  Amy Hickman, DOB [birth year], MRN [id number]  PCP:  [facility]  Cardiologist:  Buford Dresser, MD  Referring MD: [facility]*   CC: follow up  History of Present Illness:    Amy Hickman is a 86 y.o. female with a hx of persistent atrial fibrillation, hypertension, OSA on CPAP, CAD, CKD, GERD, COPD, and celiac disease who is seen for follow up today. I met her during her admission 03/17/21 for the evaluation and management of atrial fibrillation.  History: Admission 03/15/21 for sepsis in the setting of influenza A, found to have afib RVR. EF slightly reduced. Difficult to rate control, required both metoprolol and diltiazem. Cardioversion 04/24/2021 got her into sinus rhythm, but she then returned to atrial fibrillation.  Amy Hickman's daughter called the office 05/18/2021 and reported that she had a fall that morning and required 3 stitches on her scalp. She was told by ED in [facility] to hold her blood thinner for a few days as the bleeding risk outweighed her risk of stroke. We advised her to restart Eliquis on 05/21/21. If she does require cardioversion in the future, we will keep in mind that she had a temporary interruption of anticoagulation therapy.  Today: She is accompanied by her daughter. Overall, she is feeling better. The fracture in her foot has healed at this time.  Her blood pressure is elevated in clinic today. She attributes this to some stress at home. She recently had a mechanical fall and injured her face. She held her apixaban for two days, restarted 12/1.   Yesterday, she took several naps throughout the day and notes falling asleep very quickly. She believes she may be more fatigued than she feels.  Currently she takes amiodarone once daily at night.  She denies any palpitations, chest pain, or shortness of breath. No lightheadedness, headaches, syncope,  orthopnea, or PND.  We discussed options, including cardioversion, today.  Past Medical History:  Diagnosis Date   Celiac disease    GERD (gastroesophageal reflux disease)    HOH (hard of hearing)    Hypertension    Interstitial cystitis     Past Surgical History:  Procedure Laterality Date   ABDOMINAL HYSTERECTOMY     APPENDECTOMY     CARDIOVERSION N/A 04/24/2021   Procedure: CARDIOVERSION;  Surgeon: Jerline Pain, MD;  Location: [facility];  Service: Cardiovascular;  Laterality: N/A;   CHOLECYSTECTOMY     ear drum surgery     REPLACEMENT TOTAL KNEE     ROTATOR CUFF REPAIR Right    TONSILLECTOMY      Current Medications: Current Outpatient Medications on File Prior to Visit  Medication Sig   acetaminophen (TYLENOL) 500 MG tablet Take 1 tablet (500 mg total) by mouth every 6 (six) hours as needed for pain.   apixaban (ELIQUIS) 5 MG TABS tablet Take 1 tablet (5 mg total) by mouth 2 (two) times daily.   atorvastatin (LIPITOR) 10 MG tablet Take 1 tablet (10 mg total) by mouth daily. (Patient taking differently: Take 10 mg by mouth every evening.)   B Complex Vitamins (VITAMIN B COMPLEX) TABS Take 1 tablet by mouth daily.   benzonatate (TESSALON PERLES) 100 MG capsule Take 1 capsule (100 mg total) by mouth 3 (three) times daily as needed for cough. (Patient taking differently: Take 200 mg by mouth 3 (three) times daily as needed for cough.)  clidinium-chlordiazePOXIDE (LIBRAX) 5-2.5 MG capsule Take 1 capsule by mouth daily as needed (cramping).  ° Coenzyme Q10 (COQ10) 100 MG CAPS Take 100 mg by mouth every evening.  ° colchicine 0.6 MG tablet Take 0.6 mg by mouth daily as needed (for gout flareup).  ° CRANBERRY-VITAMIN C PO Take 1 capsule by mouth in the morning.  ° diclofenac Sodium (VOLTAREN) 1 % GEL Apply 2 g topically 4 (four) times daily. (Patient taking differently: Apply 2 g topically 4 (four) times daily as needed (pain).)  ° diltiazem (CARDIZEM CD) 360 MG 24 hr capsule Take 1  capsule (360 mg total) by mouth daily.  ° esomeprazole (NEXIUM) 40 MG capsule Take 40 mg by mouth daily before breakfast.  ° estradiol (ESTRACE) 0.1 MG/GM vaginal cream Place 1 Applicatorful vaginally daily as needed (irritation/dryness).  ° furosemide (LASIX) 40 MG tablet Take 1 tablet (40 mg total) by mouth daily.  ° Hyoscyamine Sulfate SL 0.125 MG SUBL DISSOLVE 1 TABLET UNDER THE TONGUE EVERY 4 HOURS AS NEEDED FOR CRAMPING  ° latanoprost (XALATAN) 0.005 % ophthalmic solution Place 1 drop into both eyes at bedtime.  ° levothyroxine (SYNTHROID) 75 MCG tablet Take 75 mcg by mouth daily before breakfast.  ° metoprolol succinate (TOPROL-XL) 200 MG 24 hr tablet Take 1 tablet (200 mg total) by mouth daily. Take with or immediately following a meal. (Patient taking differently: Take 200 mg by mouth daily with supper. Take with or immediately following a meal.)  ° pentosan polysulfate (ELMIRON) 100 MG capsule Take 100 mg by mouth 2 (two) times daily.  ° pilocarpine (PILOCAR) 1 % ophthalmic solution Place 1 drop into the left eye 3 (three) times daily.  ° Turmeric 500 MG CAPS Take 1,000 mg by mouth every evening.  ° Vitamin D, Ergocalciferol, (DRISDOL) 1.25 MG (50000 UNIT) CAPS capsule Take 50,000 Units by mouth once a week.  ° °No current facility-administered medications on file prior to visit.  °  ° °Allergies:   Ativan [lorazepam], Augmentin [amoxicillin-pot clavulanate], Avelox [moxifloxacin], Chlorzoxazone, Ciprofloxacin hcl, Crestor [rosuvastatin], Gluten meal, Nitrofuran derivatives, Phenergan [promethazine hcl], Codeine, Darvon [propoxyphene hcl], and Doxycycline  ° °Social History  ° °Tobacco Use  ° Smoking status: Former  °  Types: Cigarettes  ° Smokeless tobacco: Never  °Substance Use Topics  ° Alcohol use: Yes  °  Comment: rare  ° Drug use: No  ° ° °Family History: °family history includes Hypertension in an other family member. ° °ROS:   °Please see the history of present illness. °(+) Stress °(+)  Fatigue °All other systems are reviewed and negative.  ° ° °EKGs/Labs/Other Studies Reviewed:   ° °The following studies were reviewed today: ° °CT Chest 05/18/2021 ([facility]): °COMPARISON:  04/02/2021  ° °FINDINGS:  °Cardiovascular: Heart top-normal in size. Three-vessel coronary  °artery calcifications. No pericardial effusion. Aortic  °atherosclerosis.  ° °Mediastinum/Nodes: No neck base, mediastinal or hilar masses or  °enlarged lymph nodes. Trachea and esophagus are unremarkable.  ° °Lungs/Pleura: No evidence of pulmonary contusion or laceration. No  °no lung consolidation to suggest pneumonia and no evidence of  °pulmonary edema. Several reticulonodular type opacities, most  °evident in the anterior right middle lobe, are stable, benign. There  °are no masses or suspicious nodules. Mild paraseptal emphysema is  °noted posteriorly, also unchanged.  ° °No pleural effusion or pneumothorax.  ° °Upper Abdomen: No acute abnormality.  ° °Musculoskeletal: No fracture or acute finding. Bilateral  °well-positioned shoulder prostheses. No bone lesions. No chest wall  °  mass or contusion.   IMPRESSION:  1. No acute findings or evidence of injury to the chest. No  fractures.  2. Aortic atherosclerosis and coronary artery calcifications.   Aortic Atherosclerosis (ICD10-I70.0) and Emphysema (ICD10-J43.9).   CT Chest 04/02/2021 ([facility]): COMPARISON:  Chest x-rays from November of 2022.   FINDINGS:  Cardiovascular: Calcified atherosclerosis of the thoracic aorta.  Calcified coronary artery disease, three-vessel coronary artery  disease. No aortic dilation. Heart size moderately enlarged, no  substantial pericardial effusion. Central pulmonary vasculature is  normal caliber. Limited assessment of cardiovascular structures  given lack of intravenous contrast.   Mediastinum/Nodes: Patulous esophagus. No thoracic inlet or axillary  adenopathy though streak artifact from  bilateral reverse shoulder  arthroplasties limits assessment in the axillary regions and at the  thoracic inlet. No mediastinal or hilar adenopathy.   Lungs/Pleura: Basilar atelectasis at the LEFT lung base. No  effusion. No consolidation. 3 mm RIGHT middle lobe pulmonary nodule  (image 101/3). Airways are patent.   Upper Abdomen: Postoperative changes about the GE junction with  surgical clips in this area. No acute findings relative to  visualized liver, pancreas, spleen or very limited visualization of  other upper abdominal structures.   Musculoskeletal: Post bilateral reverse shoulder arthroplasty. No  acute musculoskeletal process. No destructive bone finding. Spinal  degenerative changes.   IMPRESSION:  No acute findings in the chest.   3 mm RIGHT middle lobe pulmonary nodule. No follow-up needed if  patient is low-risk. Non-contrast chest CT can be considered in 12  months if patient is high-risk. This recommendation follows the  consensus statement: Guidelines for Management of Incidental  Pulmonary Nodules Detected on CT Images: From the Fleischner Society  2017; Radiology 2017; 284:228-243.   Three-vessel coronary artery disease.   Postoperative changes about the GE junction.   Aortic atherosclerosis.   Echo 03/16/21 1. Left ventricular ejection fraction, by estimation, is 40 to 45%. The  left ventricle has mildly decreased function. The left ventricle  demonstrates global hypokinesis. Left ventricular diastolic parameters are  indeterminate.   2. Right ventricular systolic function is mildly reduced. The right  ventricular size is normal. There is moderately elevated pulmonary artery  systolic pressure. The estimated right ventricular systolic pressure is  33.3 mmHg.   3. Left atrial size was moderately dilated.   4. Right atrial size was severely dilated.   5. The mitral valve is normal in structure. Mild to moderate mitral valve  regurgitation.   6.  Tricuspid valve regurgitation is moderate.   7. The aortic valve is tricuspid. There is severe calcifcation of the  aortic valve. Aortic valve regurgitation is mild. Moderate aortic valve  stenosis. Gradients consistent with mild AS (MG 55mHg, Vmax 2.2 m/s), but moderate by AVA (1.0cm^2) and DI  (0.32). Low SV index, suspect low flow low gradient moderate AS   8. The inferior vena cava is dilated in size with <50% respiratory  variability, suggesting right atrial pressure of 15 mmHg.   EKG:  EKG is personally reviewed.   05/30/2021: atrial flutter at 77 bpm 05/09/2021: atrial fibrillation at 83 bpm 04/08/21: atrial fibrillation at 86 bpm  Recent Labs: 03/16/2021: ALT 22; Magnesium 1.9; TSH 0.589 03/18/2021: B Natriuretic Peptide 653.4 04/08/2021: Platelets 246 04/24/2021: BUN 33; Creatinine, Ser 1.20; Hemoglobin 11.9; Potassium 3.8; Sodium 139   Recent Lipid Panel No results found for: CHOL, TRIG, HDL, CHOLHDL, VLDL, LDLCALC, LDLDIRECT  Physical Exam:    VS:  BP (!) 152/90 (BP Location:  Right Arm, Patient Position: Sitting, Cuff Size: Normal)    Pulse 77    Ht 5' (1.524 m)    Wt 156 lb 1.6 oz (70.8 kg)    BMI 30.49 kg/m     Wt Readings from Last 3 Encounters:  05/30/21 156 lb 1.6 oz (70.8 kg)  05/09/21 157 lb (71.2 kg)  04/24/21 158 lb (71.7 kg)    GEN: Well nourished, well developed in no acute distress HEENT: Normal, moist mucous membranes. Periorbital hematoma superior to right eye. Ecchymosis across right cheek and jaw NECK: No JVD CARDIAC: Irregularly irregular, normal S1 and S2, no rubs or gallops. No murmur. VASCULAR: Radial and DP pulses 2+ bilaterally. No carotid bruits RESPIRATORY:  Clear to ausculation bilaterally, no rales or wheezing ABDOMEN: Soft, non-tender, non-distended MUSCULOSKELETAL:  Ambulates independently SKIN: Warm. Trivial bilateral LE edema. Prominent varicose veins on both legs, bilateral knee arthroplasty scars NEUROLOGIC:  Alert and oriented x 3. No  focal neuro deficits noted. PSYCHIATRIC:  Normal affect    ASSESSMENT:    1. Typical atrial flutter (HCC)   2. Persistent atrial fibrillation ([place])   3. Pre-procedure lab exam   4. Secondary hypercoagulable state ([place])   5. Long term current use of anticoagulant   6. Venous insufficiency   7. Coronary artery disease involving native coronary artery of native heart without angina pectoris   8. NICM (nonischemic cardiomyopathy) ([place])     PLAN:    Persistent atrial fibrillation/now atrial flutter Chronic systolic and diastolic heart failure Nonischemic cardiomyopathy -EF 40-45%. Recent cath with nonobstructive CAD -consolidated metoprolol to succinate at max dose -would prefer not to use diltiazem given low EF, but was not rate controlled on metoprolol alone. Will continue diltiazem for now. -CHA2DS2/VAS Stroke Risk Points=8  -continue apixaban 5 mg BID, no missed doses since 2/1 -tolerating amiodarone, now on once daily dosing -we discussed cardioversion, she is amenable. Labs today, scheduled for 2/23  Shared Decision Making/Informed Consent The risks (stroke, cardiac arrhythmias rarely resulting in the need for a temporary or permanent pacemaker, skin irritation or burns and complications associated with conscious sedation including aspiration, arrhythmia, respiratory failure and death), benefits (restoration of normal sinus rhythm) and alternatives of a direct current cardioversion were explained in detail to Amy Hickman and she agrees to proceed.   Bilateral LE edema, R >L: only trivial today -has history of vein stripping as well as bilateral knee arthroplasty, predisposing her to venous insufficiency -she is otherwise euvolemic on exam today -we discussed venous insufficiency at length. Discussed compression, elevation -she will monitor for heat, worsening erythema, or exudate on the right leg, which would be concerning for infection.  CAD, nonobstructive, without  angina -given age, continue apixaban and no aspirin -continue atorvastatin  Cardiac risk counseling and prevention recommendations: -recommend heart healthy/Mediterranean diet, with whole grains, fruits, vegetable, fish, lean meats, nuts, and olive oil. Limit salt. -recommend moderate walking, 3-5 times/week for 30-50 minutes each session. Aim for at least 150 minutes.week. Goal should be pace of 3 miles/hours, or walking 1.5 miles in 30 minutes -recommend avoidance of tobacco products. Avoid excess alcohol. -ASCVD risk score: The ASCVD Risk score (Arnett DK, et al., 2019) failed to calculate for the following reasons:   The 2019 ASCVD risk score is only valid for ages 51 to 44    Plan for follow up: 1-2 weeks following cardioversion or sooner as needed.  Buford Dresser, MD, PhD, [facility]    Medication Adjustments/Labs and  Tests Ordered: Current medicines are reviewed at length with the patient today.  Concerns regarding medicines are outlined above.   Orders Placed This Encounter  Procedures   CBC   Basic metabolic panel   EKG 71-IRCV   Meds ordered this encounter  Medications   amiodarone (PACERONE) 200 MG tablet    Sig: Take 1 tablet (200 mg total) by mouth daily.    Dispense:  90 tablet    Refill:  3   Patient Instructions  Medication Instructions:  Your Physician recommend you continue on your current medication as directed.    *If you need a refill on your cardiac medications before your next appointment, please call your pharmacy*   Lab Work: Your provider has recommended lab work today (CBC, BMP). Please have this collected at [facility] at [place]. The lab is open 8:00 am - 4:30 pm. Please avoid 12:00p - 1:00p for lunch hour. You do not need an appointment. Please go to [address] [place], [place] [id number]. This is in the Primary Care office on the 3rd floor, let them know you are there for blood work  and they will direct you to the lab.  If you have labs (blood work) drawn today and your tests are completely normal, you will receive your results only by: [place] (if you have MyChart) OR A paper copy in the mail If you have any lab test that is abnormal or we need to change your treatment, we will call you to review the results.   Testing/Procedures: [facility]   Follow-Up: At [facility], you and your health needs are our priority.  As part of our continuing mission to provide you with exceptional heart care, we have created designated Provider Care Teams.  These Care Teams include your primary Cardiologist (physician) and Advanced Practice Providers (APPs -  Physician Assistants and Nurse Practitioners) who all work together to provide you with the care you need, when you need it.  We recommend signing up for the patient portal called "MyChart".  Sign up information is provided on this After Visit Summary.  MyChart is used to connect with patients for Virtual Visits (Telemedicine).  Patients are able to view lab/test results, encounter notes, upcoming appointments, etc.  Non-urgent messages can be sent to your provider as well.   To learn more about what you can do with MyChart, go to [email or web address].    Your next appointment:   2 week(s) after Cardioversion on 2/23  The format for your next appointment:   In Person  Provider:   Buford Dresser, MD    You are scheduled for a Cardioversion on Thursday 06/12/21 with Dr. Harrell Gave.  Please arrive at the [facility] (Main Entrance A) at [facility]: [address] [place], [place] [address] at 11:30 am  DIET: Nothing to eat or drink after midnight except a sip of water with medications (see medication instructions below)  FYI: For your safety, and to allow [place] to monitor your vital signs accurately during the surgery/procedure we request that   if you have artificial nails,  gel coating, SNS etc. Please have those removed prior to your surgery/procedure. Not having the nail coverings /polish removed may result in cancellation or delay of your surgery/procedure.   Medication Instructions:  Continue your anticoagulant: Eliquis You will need to continue your anticoagulant after your procedure until you  are told by your  Provider that it is safe to stop  Labs: Today (CBC, BMP)  You must have a responsible person to drive you home and stay in the waiting area during your procedure. Failure to do so could result in cancellation.  Bring your insurance cards.  *Special Note: Every effort is made to have your procedure done on time. Occasionally there are emergencies that occur at the hospital that may cause delays. Please be patient if a delay does occur.     I,Mathew Stumpf,acting as a Education administrator for [employer], MD.,have documented all relevant documentation on the behalf of Buford Dresser, MD,as directed by  Buford Dresser, MD while in the presence of Buford Dresser, MD.  I, Buford Dresser, MD, have reviewed all documentation for this visit. The documentation on 05/30/21 for the exam, diagnosis, procedures, and orders are all accurate and complete.   Signed, Buford Dresser, MD PhD 05/30/2021    [place]

## 2021-05-31 LAB — BASIC METABOLIC PANEL
BUN/Creatinine Ratio: 22 (ref 12–28)
BUN: 34 mg/dL — ABNORMAL HIGH (ref 8–27)
CO2: 24 mmol/L (ref 20–29)
Calcium: 10.3 mg/dL (ref 8.7–10.3)
Chloride: 99 mmol/L (ref 96–106)
Creatinine, Ser: 1.57 mg/dL — ABNORMAL HIGH (ref 0.57–1.00)
Glucose: 98 mg/dL (ref 70–99)
Potassium: 4.6 mmol/L (ref 3.5–5.2)
Sodium: 140 mmol/L (ref 134–144)
eGFR: 33 mL/min/{1.73_m2} — ABNORMAL LOW (ref >59)

## 2021-05-31 LAB — CBC
Hematocrit: 38.7 % (ref 34.0–46.6)
Hemoglobin: 12.4 g/dL (ref 11.1–15.9)
MCH: 27 pg (ref 26.6–33.0)
MCHC: 32 g/dL (ref 31.5–35.7)
MCV: 84 fL (ref 79–97)
Platelets: 261 10*3/uL (ref 150–450)
RBC: 4.59 x10E6/uL (ref 3.77–5.28)
RDW: 16.4 % — ABNORMAL HIGH (ref 11.7–15.4)
WBC: 6.3 10*3/uL (ref 3.4–10.8)

## 2021-06-02 ENCOUNTER — Telehealth: Payer: Self-pay | Admitting: Cardiology

## 2021-06-02 DIAGNOSIS — I4819 Other persistent atrial fibrillation: Secondary | ICD-10-CM

## 2021-06-02 MED ORDER — AMIODARONE HCL 200 MG PO TABS: 200.0000 mg | ORAL_TABLET | Freq: Every day | ORAL | 0 refills | Status: DC

## 2021-06-02 NOTE — Telephone Encounter (Signed)
°*  STAT* If patient is at the pharmacy, call can be transferred to refill team.   1. Which medications need to be refilled? (please list name of each medication and dose if known) amiodarone (PACERONE) 200 MG tablet  2. Which pharmacy/location (including street and city if local pharmacy) is medication to be sent to? [id number] - [place], [place] - [address] AT [place]  3. Do they need a 30 day or 90 day supply? 10 day   Patient states she only has 4 tablets left and [facility] will not be able to get it to her until after 2/19. She requests a 10 day supply be sent to her local pharmacy.

## 2021-06-03 ENCOUNTER — Other Ambulatory Visit (HOSPITAL_BASED_OUTPATIENT_CLINIC_OR_DEPARTMENT_OTHER): Payer: Self-pay | Admitting: Cardiology

## 2021-06-03 DIAGNOSIS — I4819 Other persistent atrial fibrillation: Secondary | ICD-10-CM

## 2021-06-04 ENCOUNTER — Encounter (HOSPITAL_COMMUNITY): Payer: Self-pay | Admitting: Cardiology

## 2021-06-11 ENCOUNTER — Other Ambulatory Visit: Payer: Self-pay | Admitting: Cardiology

## 2021-06-11 DIAGNOSIS — I4819 Other persistent atrial fibrillation: Secondary | ICD-10-CM

## 2021-06-12 ENCOUNTER — Ambulatory Visit (HOSPITAL_COMMUNITY): Payer: Medicare Other | Admitting: Anesthesiology

## 2021-06-12 ENCOUNTER — Ambulatory Visit (HOSPITAL_COMMUNITY)
Admission: RE | Admit: 2021-06-12 | Discharge: 2021-06-12 | Disposition: A | Discharge status: Home / Self Care | Payer: Medicare Other | Attending: Cardiology | Admitting: Cardiology

## 2021-06-12 ENCOUNTER — Ambulatory Visit (HOSPITAL_BASED_OUTPATIENT_CLINIC_OR_DEPARTMENT_OTHER): Payer: Medicare Other | Admitting: Anesthesiology

## 2021-06-12 ENCOUNTER — Encounter (HOSPITAL_COMMUNITY): Payer: Self-pay | Admitting: Cardiology

## 2021-06-12 ENCOUNTER — Encounter (HOSPITAL_COMMUNITY)
Admission: RE | Disposition: A | Discharge status: Home / Self Care | Payer: Self-pay | Source: Home / Self Care | Attending: Cardiology

## 2021-06-12 DIAGNOSIS — J449 Chronic obstructive pulmonary disease, unspecified: Secondary | ICD-10-CM | POA: Insufficient documentation

## 2021-06-12 DIAGNOSIS — I509 Heart failure, unspecified: Secondary | ICD-10-CM | POA: Diagnosis not present

## 2021-06-12 DIAGNOSIS — E039 Hypothyroidism, unspecified: Secondary | ICD-10-CM | POA: Insufficient documentation

## 2021-06-12 DIAGNOSIS — I4510 Unspecified right bundle-branch block: Secondary | ICD-10-CM | POA: Insufficient documentation

## 2021-06-12 DIAGNOSIS — I4892 Unspecified atrial flutter: Secondary | ICD-10-CM | POA: Diagnosis not present

## 2021-06-12 DIAGNOSIS — K219 Gastro-esophageal reflux disease without esophagitis: Secondary | ICD-10-CM | POA: Insufficient documentation

## 2021-06-12 DIAGNOSIS — I4891 Unspecified atrial fibrillation: Secondary | ICD-10-CM | POA: Insufficient documentation

## 2021-06-12 DIAGNOSIS — I110 Hypertensive heart disease with heart failure: Secondary | ICD-10-CM

## 2021-06-12 DIAGNOSIS — Z87891 Personal history of nicotine dependence: Secondary | ICD-10-CM | POA: Diagnosis not present

## 2021-06-12 DIAGNOSIS — I5022 Chronic systolic (congestive) heart failure: Secondary | ICD-10-CM

## 2021-06-12 DIAGNOSIS — I4819 Other persistent atrial fibrillation: Secondary | ICD-10-CM | POA: Diagnosis not present

## 2021-06-12 DIAGNOSIS — K900 Celiac disease: Secondary | ICD-10-CM | POA: Insufficient documentation

## 2021-06-12 DIAGNOSIS — I491 Atrial premature depolarization: Secondary | ICD-10-CM | POA: Insufficient documentation

## 2021-06-12 HISTORY — PX: CARDIOVERSION: SHX1299

## 2021-06-12 SURGERY — CARDIOVERSION
Anesthesia: General

## 2021-06-12 MED ORDER — METOPROLOL SUCCINATE ER 200 MG PO TB24: 100.0000 mg | ORAL_TABLET | Freq: Every day | ORAL | 3 refills | Status: DC

## 2021-06-12 MED ORDER — LIDOCAINE 2% (20 MG/ML) 5 ML SYRINGE
INTRAMUSCULAR | Status: DC | PRN
Start: 2021-06-12 — End: 2021-06-12
  Administered 2021-06-12: 100 mg via INTRAVENOUS

## 2021-06-12 MED ORDER — AMIODARONE HCL 200 MG PO TABS: 200.0000 mg | ORAL_TABLET | Freq: Every day | ORAL | 3 refills | Status: DC

## 2021-06-12 MED ORDER — DILTIAZEM HCL ER COATED BEADS 360 MG PO CP24: 360.0000 mg | ORAL_CAPSULE | Freq: Every day | ORAL | 3 refills | Status: DC

## 2021-06-12 MED ORDER — SODIUM CHLORIDE 0.9 % IV SOLN: INTRAVENOUS | Status: DC | PRN

## 2021-06-12 MED ORDER — PROPOFOL 10 MG/ML IV BOLUS
INTRAVENOUS | Status: DC | PRN
  Administered 2021-06-12: 50 mg via INTRAVENOUS

## 2021-06-12 NOTE — Anesthesia Preprocedure Evaluation (Addendum)
Anesthesia Evaluation  Patient identified by MRN, date of birth, ID band Patient awake    Reviewed: Allergy & Precautions, H&P , NPO status , Patient's Chart, lab work & pertinent test results  Airway Mallampati: II   Neck ROM: full    Dental  (+) Edentulous Upper, Edentulous Lower, Upper Dentures, Lower Dentures   Pulmonary COPD, former smoker,    breath sounds clear to auscultation       Cardiovascular hypertension, +CHF  + dysrhythmias Atrial Fibrillation  Rhythm:irregular Rate:Normal     Neuro/Psych    GI/Hepatic GERD  Medicated,  Endo/Other  Hypothyroidism   Renal/GU      Musculoskeletal   Abdominal   Peds  Hematology  (+) Blood dyscrasia, anemia ,   Anesthesia Other Findings   Reproductive/Obstetrics                            Anesthesia Physical  Anesthesia Plan  ASA: 3  Anesthesia Plan: General   Post-op Pain Management:    Induction: Intravenous  PONV Risk Score and Plan: 3 and Propofol infusion and Treatment may vary due to age or medical condition  Airway Management Planned: Mask  Additional Equipment:   Intra-op Plan:   Post-operative Plan:   Informed Consent: I have reviewed the patients History and Physical, chart, labs and discussed the procedure including the risks, benefits and alternatives for the proposed anesthesia with the patient or authorized representative who has indicated his/her understanding and acceptance.     Dental advisory given  Plan Discussed with: CRNA, Anesthesiologist and Surgeon  Anesthesia Plan Comments:         Anesthesia Quick Evaluation

## 2021-06-12 NOTE — Anesthesia Procedure Notes (Signed)
Procedure Name: General with mask airway Date/Time: 06/12/2021 12:29 PM Performed by: Erick Colace, CRNA Pre-anesthesia Checklist: Patient identified, Emergency Drugs available, Suction available, Patient being monitored and Timeout performed Patient Re-evaluated:Patient Re-evaluated prior to induction Oxygen Delivery Method: Ambu bag Preoxygenation: Pre-oxygenation with 100% oxygen Induction Type: IV induction

## 2021-06-12 NOTE — Interval H&P Note (Signed)
History and Physical Interval Note:  06/12/2021 12:26 PM  Amy Hickman  has presented today for surgery, with the diagnosis of afib.  The various methods of treatment have been discussed with the patient and family. After consideration of risks, benefits and other options for treatment, the patient has consented to  Procedure(s): CARDIOVERSION (N/A) as a surgical intervention.  The patient's history has been reviewed, patient examined, no change in status, stable for surgery.  I have reviewed the patient's chart and labs.  Questions were answered to the patient's satisfaction.     Ethelle Ola Harrell Gave

## 2021-06-12 NOTE — Transfer of Care (Signed)
Immediate Anesthesia Transfer of Care Note  Patient: Amy Hickman  Procedure(s) Performed: CARDIOVERSION  Patient Location: Short Stay  Anesthesia Type:General  Level of Consciousness: drowsy  Airway & Oxygen Therapy: Patient Spontanous Breathing  Post-op Assessment: Report given to RN and Post -op Vital signs reviewed and stable  Post vital signs: Reviewed and stable  Last Vitals:  Vitals Value Taken Time  BP 119/54 06/12/21 1301  Temp    Pulse 48 06/12/21 1303  Resp 16 06/12/21 1303  SpO2 98 % 06/12/21 1303    Last Pain:  Vitals:   06/12/21 1200  TempSrc: Temporal  PainSc: 0-No pain         Complications: No notable events documented.

## 2021-06-12 NOTE — Discharge Instructions (Signed)

## 2021-06-12 NOTE — CV Procedure (Signed)
Procedure:   DCCV  Indication:  Symptomatic atrial flutter/atrial fibrillation  Procedure Note:  The patient signed informed consent.  They have had had therapeutic anticoagulation with apixaban greater than 3 weeks.  Anesthesia was administered by Dr. Ambrose Pancoast.  Patient received 100 mg IV lidocaine and 50 mg IV propofol.Adequate airway was maintained throughout and vital followed per protocol.  They were cardioverted x 2 with 120, 150 J of biphasic synchronized energy.  They converted to sinus bradycardia.  There were no apparent complications.  The patient had normal neuro status and respiratory status post procedure with vitals stable as recorded elsewhere.    Follow up:  They will continue on current medical therapy and follow up with cardiology as scheduled.  Buford Dresser, MD PhD 06/12/2021 1:04 PM

## 2021-06-13 ENCOUNTER — Encounter (HOSPITAL_COMMUNITY): Payer: Self-pay | Admitting: Cardiology

## 2021-06-13 NOTE — Anesthesia Postprocedure Evaluation (Signed)
Anesthesia Post Note  Patient: Amy Hickman  Procedure(s) Performed: CARDIOVERSION     Patient location during evaluation: PACU Anesthesia Type: General Level of consciousness: awake and alert Pain management: pain level controlled Vital Signs Assessment: post-procedure vital signs reviewed and stable Respiratory status: spontaneous breathing, nonlabored ventilation, respiratory function stable and patient connected to nasal cannula oxygen Cardiovascular status: blood pressure returned to baseline and stable Postop Assessment: no apparent nausea or vomiting Anesthetic complications: no   No notable events documented.  Last Vitals:  Vitals:   06/12/21 1320 06/12/21 1330  BP: (!) 140/59 (!) 126/55  Pulse: (!) 58 61  Resp: (!) 25 14  Temp:    SpO2: 98% 98%    Last Pain:  Vitals:   06/12/21 1330  TempSrc:   PainSc: 0-No pain                 Zakya Halabi

## 2021-06-25 ENCOUNTER — Telehealth: Payer: Self-pay | Admitting: Cardiology

## 2021-06-25 ENCOUNTER — Inpatient Hospital Stay (HOSPITAL_COMMUNITY): Admission: RE | Admit: 2021-06-25 | Payer: Medicare Other | Source: Ambulatory Visit | Admitting: Physician Assistant

## 2021-06-25 ENCOUNTER — Encounter (HOSPITAL_COMMUNITY): Payer: Self-pay | Admitting: Nurse Practitioner

## 2021-06-25 ENCOUNTER — Ambulatory Visit (HOSPITAL_COMMUNITY)
Admission: RE | Admit: 2021-06-25 | Discharge: 2021-06-25 | Disposition: A | Discharge status: Home / Self Care | Payer: Medicare Other | Source: Ambulatory Visit | Attending: Nurse Practitioner | Admitting: Nurse Practitioner

## 2021-06-25 ENCOUNTER — Other Ambulatory Visit: Payer: Self-pay

## 2021-06-25 ENCOUNTER — Encounter (HOSPITAL_COMMUNITY): Payer: Self-pay

## 2021-06-25 VITALS — BP 170/94 | HR 71 | Ht 60.0 in | Wt 152.2 lb

## 2021-06-25 DIAGNOSIS — I130 Hypertensive heart and chronic kidney disease with heart failure and stage 1 through stage 4 chronic kidney disease, or unspecified chronic kidney disease: Secondary | ICD-10-CM | POA: Insufficient documentation

## 2021-06-25 DIAGNOSIS — I5022 Chronic systolic (congestive) heart failure: Secondary | ICD-10-CM | POA: Insufficient documentation

## 2021-06-25 DIAGNOSIS — Z79899 Other long term (current) drug therapy: Secondary | ICD-10-CM | POA: Insufficient documentation

## 2021-06-25 DIAGNOSIS — Z7901 Long term (current) use of anticoagulants: Secondary | ICD-10-CM | POA: Diagnosis not present

## 2021-06-25 DIAGNOSIS — N189 Chronic kidney disease, unspecified: Secondary | ICD-10-CM | POA: Insufficient documentation

## 2021-06-25 DIAGNOSIS — G4733 Obstructive sleep apnea (adult) (pediatric): Secondary | ICD-10-CM | POA: Diagnosis not present

## 2021-06-25 DIAGNOSIS — I4892 Unspecified atrial flutter: Secondary | ICD-10-CM | POA: Insufficient documentation

## 2021-06-25 DIAGNOSIS — D6869 Other thrombophilia: Secondary | ICD-10-CM | POA: Insufficient documentation

## 2021-06-25 DIAGNOSIS — R9431 Abnormal electrocardiogram [ECG] [EKG]: Secondary | ICD-10-CM | POA: Diagnosis not present

## 2021-06-25 DIAGNOSIS — I4439 Other atrioventricular block: Secondary | ICD-10-CM | POA: Insufficient documentation

## 2021-06-25 DIAGNOSIS — K900 Celiac disease: Secondary | ICD-10-CM | POA: Diagnosis not present

## 2021-06-25 DIAGNOSIS — Z6829 Body mass index (BMI) 29.0-29.9, adult: Secondary | ICD-10-CM | POA: Insufficient documentation

## 2021-06-25 DIAGNOSIS — I4819 Other persistent atrial fibrillation: Secondary | ICD-10-CM | POA: Insufficient documentation

## 2021-06-25 DIAGNOSIS — K219 Gastro-esophageal reflux disease without esophagitis: Secondary | ICD-10-CM | POA: Insufficient documentation

## 2021-06-25 DIAGNOSIS — J449 Chronic obstructive pulmonary disease, unspecified: Secondary | ICD-10-CM | POA: Diagnosis not present

## 2021-06-25 DIAGNOSIS — E669 Obesity, unspecified: Secondary | ICD-10-CM | POA: Insufficient documentation

## 2021-06-25 DIAGNOSIS — I4510 Unspecified right bundle-branch block: Secondary | ICD-10-CM | POA: Diagnosis not present

## 2021-06-25 DIAGNOSIS — I2510 Atherosclerotic heart disease of native coronary artery without angina pectoris: Secondary | ICD-10-CM | POA: Insufficient documentation

## 2021-06-25 MED ORDER — AMLODIPINE BESYLATE 2.5 MG PO TABS: 2.5000 mg | ORAL_TABLET | Freq: Every day | ORAL | 3 refills | Status: DC

## 2021-06-25 MED ORDER — AMIODARONE HCL 200 MG PO TABS: 200.0000 mg | ORAL_TABLET | Freq: Two times a day (BID) | ORAL | 3 refills | Status: DC

## 2021-06-25 NOTE — Telephone Encounter (Signed)
Patient c/o Palpitations:  High priority if patient c/o lightheadedness, shortness of breath, or chest pain ? ?How long have you had palpitations/irregular HR/ Afib? Yesterday afternoon. Are you having the symptoms now? yes ? ?Are you currently experiencing lightheadedness, SOB or CP? SOB ? ?Do you have a history of afib (atrial fibrillation) or irregular heart rhythm? History of A-Fib ? ?Have you checked your BP or HR? (document readings if available): no ? ?Are you experiencing any other symptoms? Feels tired.   ?

## 2021-06-25 NOTE — Telephone Encounter (Signed)
-  Pt called stating per her apple watch, she has been back in afib since last night ?-Pt state she doesn't feel like her heart is racing but is experiencing a heaviness/uncomfortable feeling. No severe pain. ?-Pt also report SOB with walking. ?-Dr. Harrell Gave made aware and recommended schedule and appointment with afib clinc. ?-Appointment scheduled for today 3/8 at 10 am ?-Pt made aware and verbalized understanding.   ? ? ?

## 2021-06-25 NOTE — Progress Notes (Signed)
Primary Care Physician: [facility] Primary Cardiologist: Dr Harrell Gave  Primary Electrophysiologist: none Referring Physician: Dr Ladene Artist is a 86 y.o. female with a history of hypertension, OSA on CPAP, CAD, CKD, GERD, COPD, HTN, chronic systolic CHF, celiac disease, atrial fibrillation who presents for consultation in the [facility]. Patient was admitted 03/15/21 for sepsis in the setting of influenza A, found to have afib RVR. EF slightly reduced. Difficult to rate control, required both metoprolol and diltiazem. Cardioversion 04/24/2021 got her into sinus rhythm, but she then returned to atrial fibrillation. She was started on amiodarone and had repeat DCCV on 06/12/21. Patient is on Eliquis for a CHADS2VASC score of 6 and is on eliquis 5 mg bid.  She is now  in the afib clinic for recent return of rate controlled afib.   Today, she has symptoms of  palpitations, mild chest pressure , shortness of breath, no orthopnea, PND, lower extremity edema, dizziness, presyncope, syncope, bleeding, or neurologic sequela. The patient is tolerating medications without difficulties and is otherwise without complaint today.    Atrial Fibrillation Risk Factors:  she does have symptoms or diagnosis of sleep apnea. She is picking up cpap today  she does not have a history of rheumatic fever. she does not have a history of alcohol use. The patient does not have a history of early familial atrial fibrillation or other arrhythmias.  she has a BMI of Body mass index is 29.72 kg/m.Marland Kitchen Filed Weights   06/25/21 1053  Weight: 69 kg    Family History  Problem Relation Age of Onset   Hypertension Other      Atrial Fibrillation Management history:  Previous antiarrhythmic drugs: amiodarone  Previous cardioversions: 04/24/21, 06/12/21 Previous ablations: none CHADS2VASC score: 6 Anticoagulation history:  Eliquis   Past Medical History:  Diagnosis Date   Celiac disease    GERD (gastroesophageal reflux disease)    HOH (hard of hearing)    Hypertension    Interstitial cystitis    Past Surgical History:  Procedure Laterality Date   ABDOMINAL HYSTERECTOMY     APPENDECTOMY     CARDIOVERSION N/A 04/24/2021   Procedure: CARDIOVERSION;  Surgeon: Jerline Pain, MD;  Location: [place];  Service: Cardiovascular;  Laterality: N/A;   CARDIOVERSION N/A 06/12/2021   Procedure: CARDIOVERSION;  Surgeon: Buford Dresser, MD;  Location: [facility] ENDOSCOPY;  Service: Cardiovascular;  Laterality: N/A;   CHOLECYSTECTOMY     ear drum surgery     REPLACEMENT TOTAL KNEE     ROTATOR CUFF REPAIR Right    TONSILLECTOMY      Current Outpatient Medications  Medication Sig Dispense Refill   acetaminophen (TYLENOL) 500 MG tablet Take 1 tablet (500 mg total) by mouth every 6 (six) hours as needed for pain. 30 tablet 0   amLODipine (NORVASC) 2.5 MG tablet Take 1 tablet (2.5 mg total) by mouth daily. 30 tablet 3   apixaban (ELIQUIS) 5 MG TABS tablet Take 1 tablet (5 mg total) by mouth 2 (two) times daily. 180 tablet 3   atorvastatin (LIPITOR) 10 MG tablet Take 1 tablet (10 mg total) by mouth daily. (Patient taking differently: Take 10 mg by mouth every evening.) 30 tablet 11   B Complex Vitamins (VITAMIN B COMPLEX) TABS Take 1 tablet by mouth daily.     clidinium-chlordiazePOXIDE (LIBRAX) 5-2.5 MG capsule Take 1 capsule by mouth daily as needed (cramping).     Coenzyme  Q10 (COQ10) 100 MG CAPS Take 100 mg by mouth every evening.     colchicine 0.6 MG tablet Take 0.6 mg by mouth daily as needed (for gout flareup).     CRANBERRY-VITAMIN C PO Take 1 capsule by mouth in the morning.     diclofenac Sodium (VOLTAREN) 1 % GEL Apply 2 g topically 4 (four) times daily. (Patient taking differently: Apply 2 g topically 4 (four) times daily as needed (pain).) 2 g 0   diltiazem (CARDIZEM CD) 360 MG 24 hr capsule Take 1  capsule (360 mg total) by mouth daily. 90 capsule 3   esomeprazole (NEXIUM) 40 MG capsule Take 40 mg by mouth daily before breakfast.     estradiol (ESTRACE) 0.1 MG/GM vaginal cream Place 1 Applicatorful vaginally daily as needed (irritation/dryness).     fenofibrate 54 MG tablet Take 54 mg by mouth daily.     furosemide (LASIX) 40 MG tablet Take 1 tablet (40 mg total) by mouth daily. 90 tablet 3   Hyoscyamine Sulfate SL 0.125 MG SUBL Place 0.125 mg under the tongue every 4 (four) hours as needed (cramping).     latanoprost (XALATAN) 0.005 % ophthalmic solution Place 1 drop into both eyes at bedtime.     levothyroxine (SYNTHROID) 75 MCG tablet Take 75 mcg by mouth daily before breakfast.     metoprolol (TOPROL-XL) 200 MG 24 hr tablet Take 0.5 tablets (100 mg total) by mouth daily. Take with or immediately following a meal. 45 tablet 3   pentosan polysulfate (ELMIRON) 100 MG capsule Take 100 mg by mouth 2 (two) times daily.     pilocarpine (PILOCAR) 1 % ophthalmic solution Place 1 drop into the left eye 3 (three) times daily.     Turmeric 500 MG CAPS Take 1,000 mg by mouth every evening.     Vitamin D, Ergocalciferol, (DRISDOL) 1.25 MG (50000 UNIT) CAPS capsule Take 50,000 Units by mouth once a week. Saturday     amiodarone (PACERONE) 200 MG tablet Take 1 tablet (200 mg total) by mouth 2 (two) times daily for 360 doses. 90 tablet 3   No current facility-administered medications for this encounter.    Allergies  Allergen Reactions   Ativan [Lorazepam] Other (See Comments)    "Knocked her out, woke up 3 days later"   Augmentin [Amoxicillin-Pot Clavulanate] Nausea And Vomiting   Avelox [Moxifloxacin] Itching and Nausea Only   Chlorzoxazone Other (See Comments)    Went crazy/loopy   Ciprofloxacin Hcl Itching and Nausea Only   Crestor [Rosuvastatin] Other (See Comments)    myalgia   Gluten Meal Other (See Comments)    Diarrhea, hot/cold sweat, will sleep for a couple of hours - Celiac disease    Nitrofuran Derivatives Itching and Nausea Only   Phenergan [Promethazine Hcl] Other (See Comments)    hallucinations   Codeine Palpitations   Darvon [Propoxyphene Hcl] Palpitations   Doxycycline Itching and Nausea Only    Social History   Socioeconomic History   Marital status: Widowed    Spouse name: Not on file   Number of children: Not on file   Years of education: Not on file   Highest education level: Not on file  Occupational History   Not on file  Tobacco Use   Smoking status: Former    Types: Cigarettes   Smokeless tobacco: Never  Substance and Sexual Activity   Alcohol use: Yes    Comment: rare   Drug use: No   Sexual activity: Not on  file  Other Topics Concern   Not on file  Social History Narrative   Not on file   Social Determinants of Health   Financial Resource Strain: Not on file  Food Insecurity: Not on file  Transportation Needs: Not on file  Physical Activity: Not on file  Stress: Not on file  Social Connections: Not on file  Intimate Partner Violence: Not on file     ROS- All systems are reviewed and negative except as per the HPI above.  Physical Exam: Vitals:   06/25/21 1053  BP: (!) 170/94  Pulse: 71  Weight: 69 kg  Height: 5' (1.524 m)    GEN- The patient is a well appearing female, alert and oriented x 3 today.   Head- normocephalic, atraumatic Eyes-  Sclera clear, conjunctiva pink Ears- hearing intact Oropharynx- clear Neck- supple  Lungs- Clear to ausculation bilaterally, normal work of breathing Heart- irregular rate and rhythm, no murmurs, rubs or gallops  GI- soft, NT, ND, + BS Extremities- no clubbing, cyanosis, or edema MS- no significant deformity or atrophy Skin- no rash or lesion Psych- euthymic mood, full affect Neuro- strength and sensation are intact  Wt Readings from Last 3 Encounters:  06/25/21 69 kg  06/12/21 70.8 kg  05/30/21 70.8 kg    EKG today demonstrates Vent. rate 71 BPM PR interval *  ms QRS duration 94 ms QT/QTcB 410/445 ms P-R-T axes * -28 -1 Atrial flutter with variable A-V block Incomplete right bundle branch block Cannot rule out Anteroseptal infarct , age undetermined Abnormal ECG When compared with ECG of 12-Jun-2021 13:13, PREVIOUS ECG IS PRESENT Referred by: [facility] [place]   Echo 03/16/21 demonstrated   1. Left ventricular ejection fraction, by estimation, is 40 to 45%. The  left ventricle has mildly decreased function. The left ventricle  demonstrates global hypokinesis. Left ventricular diastolic parameters are indeterminate.   2. Right ventricular systolic function is mildly reduced. The right  ventricular size is normal. There is moderately elevated pulmonary artery systolic pressure. The estimated right ventricular systolic pressure is 24.2 mmHg.   3. Left atrial size was moderately dilated.   4. Right atrial size was severely dilated.   5. The mitral valve is normal in structure. Mild to moderate mitral valve  regurgitation.   6. Tricuspid valve regurgitation is moderate.   7. The aortic valve is tricuspid. There is severe calcifcation of the  aortic valve. Aortic valve regurgitation is mild. Moderate aortic valve  stenosis. Gradients consistent with mild AS (MG 44mHg, Vmax 2.2 m/s), but moderate by AVA (1.0cm^2) and DI  (0.32). Low SV index, suspect low flow low gradient moderate AS   8. The inferior vena cava is dilated in size with <50% respiratory  variability, suggesting right atrial pressure of 15 mmHg.   Epic records are reviewed at length today  CHA2DS2-VASc Score = 5  The patient's score is based upon: CHF History: 0 HTN History: 1 Diabetes History: 0 Stroke History: 0 Vascular Disease History: 1 Age Score: 2 Gender Score: 1    ASSESSMENT AND PLAN: 1. Persistent Atrial Fibrillation/atrial flutter The patient's CHA2DS2-VASc score is 5, indicating a 7.2% annual risk of stroke.   S/p DCCV 06/12/21 with early return of  afib I discussed with pt loading amiodarone for awhile longer at 200 mg bid  then attempting cardioversion again and she and daughter are  is in agreement   Because of age amy not be ideal ablation candidate If Tikosyn was used, would  have to stop amio for 1-2 months or until level dropped below .3 Continue diltiazem 360 mg daily Continue Toprol 100 mg daily Continue Eliquis 5 mg BID  2. Secondary Hypercoagulable State (ICD10:  D68.69) The patient is at significant risk for stroke/thromboembolism based upon her CHA2DS2-VASc Score of 5.  Continue Apixaban (Eliquis).   3. Obesity Body mass index is 29.72 kg/m. Lifestyle modification was discussed at length including regular exercise and weight reduction.  4. Obstructive sleep apnea The importance of adequate treatment of sleep apnea was discussed today in order to improve our ability to maintain sinus rhythm long term. She has had a recent positive sleep study and picks up cpap today   5. Chronic systolic CHF EF 26-20% Appears euvolemic today.  6. CAD Mild chest heaviness associated with return of  afib   7. HTN Pt states that BP has been elevated since metoprolol was reduced to 100 mg after CV for bradycardia Will add amlodipine 2.5 mg daily    Follow up one week    Butch Penny C. Joyous Gleghorn, [facility] [address] [place], [place] [address] [phone]

## 2021-06-25 NOTE — Patient Instructions (Signed)
Increase Amiodarone 258m twice a day ? ?Start Amlodipine 2.544monce a day ?

## 2021-06-30 ENCOUNTER — Ambulatory Visit (HOSPITAL_BASED_OUTPATIENT_CLINIC_OR_DEPARTMENT_OTHER): Payer: Medicare Other | Admitting: Cardiology

## 2021-07-01 ENCOUNTER — Ambulatory Visit (HOSPITAL_COMMUNITY): Payer: Medicare Other | Admitting: Nurse Practitioner

## 2021-07-02 ENCOUNTER — Ambulatory Visit (HOSPITAL_COMMUNITY)
Admission: RE | Admit: 2021-07-02 | Discharge: 2021-07-02 | Disposition: A | Discharge status: Home / Self Care | Payer: Medicare Other | Source: Ambulatory Visit | Attending: Nurse Practitioner | Admitting: Nurse Practitioner

## 2021-07-02 ENCOUNTER — Encounter (HOSPITAL_COMMUNITY): Payer: Self-pay | Admitting: Nurse Practitioner

## 2021-07-02 ENCOUNTER — Other Ambulatory Visit: Payer: Self-pay

## 2021-07-02 VITALS — BP 156/74 | HR 78 | Ht 60.0 in | Wt 155.4 lb

## 2021-07-02 DIAGNOSIS — G4733 Obstructive sleep apnea (adult) (pediatric): Secondary | ICD-10-CM | POA: Insufficient documentation

## 2021-07-02 DIAGNOSIS — I484 Atypical atrial flutter: Secondary | ICD-10-CM

## 2021-07-02 DIAGNOSIS — I130 Hypertensive heart and chronic kidney disease with heart failure and stage 1 through stage 4 chronic kidney disease, or unspecified chronic kidney disease: Secondary | ICD-10-CM | POA: Diagnosis not present

## 2021-07-02 DIAGNOSIS — I4892 Unspecified atrial flutter: Secondary | ICD-10-CM | POA: Insufficient documentation

## 2021-07-02 DIAGNOSIS — K219 Gastro-esophageal reflux disease without esophagitis: Secondary | ICD-10-CM | POA: Diagnosis not present

## 2021-07-02 DIAGNOSIS — I4819 Other persistent atrial fibrillation: Secondary | ICD-10-CM | POA: Insufficient documentation

## 2021-07-02 DIAGNOSIS — E669 Obesity, unspecified: Secondary | ICD-10-CM | POA: Diagnosis not present

## 2021-07-02 DIAGNOSIS — I2510 Atherosclerotic heart disease of native coronary artery without angina pectoris: Secondary | ICD-10-CM | POA: Insufficient documentation

## 2021-07-02 DIAGNOSIS — Z7901 Long term (current) use of anticoagulants: Secondary | ICD-10-CM | POA: Insufficient documentation

## 2021-07-02 DIAGNOSIS — I5022 Chronic systolic (congestive) heart failure: Secondary | ICD-10-CM | POA: Diagnosis not present

## 2021-07-02 DIAGNOSIS — D6869 Other thrombophilia: Secondary | ICD-10-CM | POA: Insufficient documentation

## 2021-07-02 DIAGNOSIS — Z6830 Body mass index (BMI) 30.0-30.9, adult: Secondary | ICD-10-CM | POA: Insufficient documentation

## 2021-07-02 DIAGNOSIS — N189 Chronic kidney disease, unspecified: Secondary | ICD-10-CM | POA: Insufficient documentation

## 2021-07-02 NOTE — Patient Instructions (Signed)
Day of cardioversion reduce amiodarone to 246m once a day ? ?Cardioversion scheduled for Tuesday, April 4th ? - [facility] [employer] go to admitting at 930am ? - Do not eat or drink anything after midnight the night prior to your procedure. ? - Take all your morning medication (except diabetic medications) with a sip of water prior to arrival. ? - You will not be able to drive home after your procedure. ? - Do NOT miss any doses of your blood thinner - if you should miss a dose please notify our office immediately. ? - If you feel as if you go back into normal rhythm prior to scheduled cardioversion, please notify our office immediately. If your procedure is canceled in the cardioversion suite you will be charged a cancellation fee. ? ?

## 2021-07-02 NOTE — Progress Notes (Signed)
? ? ?Primary Care Physician: [facility] ?Primary Cardiologist: Dr Harrell Gave  ?Primary Electrophysiologist: none ?Referring Physician: Dr Harrell Gave  ? ? ?Amy Hickman is a 86 y.o. female with a history of hypertension, OSA on CPAP, CAD, CKD, GERD, COPD, HTN, chronic systolic CHF, celiac disease, atrial fibrillation who presents for consultation in the [facility]. Patient was admitted 03/15/21 for sepsis in the setting of influenza A, found to have afib RVR. EF slightly reduced. Difficult to rate control, required both metoprolol and diltiazem. Cardioversion 04/24/2021 got her into sinus rhythm, but she then returned to atrial fibrillation. She was started on amiodarone and had repeat DCCV on 06/12/21. Patient is on Eliquis for a CHADS2VASC score of 6 and is on eliquis 5 mg bid.  ?She is now  in the afib clinic for recent return of rate controlled afib.  ? ?F/u afib clinic, 07/02/21. She remains in a rate controlled atrial flutter. We discussed cardioversion in a few weeks and she and her daughter are in agreement. She continues to reload at 200 mg bid. No missed eliqius.  ? ?Today, she has symptoms of  palpitations, mild chest pressure , shortness of breath, no orthopnea, PND, lower extremity edema, dizziness, presyncope, syncope, bleeding, or neurologic sequela. The patient is tolerating medications without difficulties and is otherwise without complaint today.  ? ? ?Atrial Fibrillation Risk Factors: ? ?she does have symptoms or diagnosis of sleep apnea. ?She is picking up cpap today  ?she does not have a history of rheumatic fever. ?she does not have a history of alcohol use. ?The patient does not have a history of early familial atrial fibrillation or other arrhythmias. ? ?she has a BMI of Body mass index is 30.35 kg/m?Marland KitchenMarland Kitchen ?Filed Weights  ? 07/02/21 0935  ?Weight: 70.5 kg  ? ? ?Family History  ?Problem Relation Age of Onset  ? Hypertension Other    ? ? ? ?Atrial Fibrillation Management history: ? ?Previous antiarrhythmic drugs: amiodarone  ?Previous cardioversions: 04/24/21, 06/12/21 ?Previous ablations: none ?CHADS2VASC score: 6 ?Anticoagulation history: Eliquis ? ? ?Past Medical History:  ?Diagnosis Date  ? Celiac disease   ? GERD (gastroesophageal reflux disease)   ? HOH (hard of hearing)   ? Hypertension   ? Interstitial cystitis   ? ?Past Surgical History:  ?Procedure Laterality Date  ? ABDOMINAL HYSTERECTOMY    ? APPENDECTOMY    ? CARDIOVERSION N/A 04/24/2021  ? Procedure: CARDIOVERSION;  Surgeon: Jerline Pain, MD;  Location: [facility] ENDOSCOPY;  Service: Cardiovascular;  Laterality: N/A;  ? CARDIOVERSION N/A 06/12/2021  ? Procedure: CARDIOVERSION;  Surgeon: Buford Dresser, MD;  Location: [facility] ENDOSCOPY;  Service: Cardiovascular;  Laterality: N/A;  ? CHOLECYSTECTOMY    ? ear drum surgery    ? REPLACEMENT TOTAL KNEE    ? ROTATOR CUFF REPAIR Right   ? TONSILLECTOMY    ? ? ?Current Outpatient Medications  ?Medication Sig Dispense Refill  ? acetaminophen (TYLENOL) 500 MG tablet Take 1 tablet (500 mg total) by mouth every 6 (six) hours as needed for pain. 30 tablet 0  ? amiodarone (PACERONE) 200 MG tablet Take 1 tablet (200 mg total) by mouth 2 (two) times daily for 360 doses. 90 tablet 3  ? amLODipine (NORVASC) 2.5 MG tablet Take 1 tablet (2.5 mg total) by mouth daily. 30 tablet 3  ? apixaban (ELIQUIS) 5 MG TABS tablet Take 1 tablet (5 mg total) by mouth 2 (two) times daily. 180 tablet 3  ?  atorvastatin (LIPITOR) 10 MG tablet Take 1 tablet (10 mg total) by mouth daily. (Patient taking differently: Take 10 mg by mouth every evening.) 30 tablet 11  ? B Complex Vitamins (VITAMIN B COMPLEX) TABS Take 1 tablet by mouth daily.    ? clidinium-chlordiazePOXIDE (LIBRAX) 5-2.5 MG capsule Take 1 capsule by mouth daily as needed (cramping).    ? Coenzyme Q10 (COQ10) 100 MG CAPS Take 100 mg by mouth every evening.    ? colchicine 0.6 MG tablet Take 0.6 mg by mouth daily as  needed (for gout flareup).    ? CRANBERRY-VITAMIN C PO Take 1 capsule by mouth in the morning.    ? diclofenac Sodium (VOLTAREN) 1 % GEL Apply 2 g topically 4 (four) times daily. (Patient taking differently: Apply 2 g topically 4 (four) times daily as needed (pain).) 2 g 0  ? diltiazem (CARDIZEM CD) 360 MG 24 hr capsule Take 1 capsule (360 mg total) by mouth daily. 90 capsule 3  ? esomeprazole (NEXIUM) 40 MG capsule Take 40 mg by mouth daily before breakfast.    ? estradiol (ESTRACE) 0.1 MG/GM vaginal cream Place 1 Applicatorful vaginally daily as needed (irritation/dryness).    ? fenofibrate 54 MG tablet Take 54 mg by mouth daily.    ? furosemide (LASIX) 40 MG tablet Take 1 tablet (40 mg total) by mouth daily. 90 tablet 3  ? Hyoscyamine Sulfate SL 0.125 MG SUBL Place 0.125 mg under the tongue every 4 (four) hours as needed (cramping).    ? latanoprost (XALATAN) 0.005 % ophthalmic solution Place 1 drop into both eyes at bedtime.    ? levothyroxine (SYNTHROID) 75 MCG tablet Take 75 mcg by mouth daily before breakfast.    ? metoprolol (TOPROL-XL) 200 MG 24 hr tablet Take 0.5 tablets (100 mg total) by mouth daily. Take with or immediately following a meal. 45 tablet 3  ? pentosan polysulfate (ELMIRON) 100 MG capsule Take 100 mg by mouth 2 (two) times daily.    ? pilocarpine (PILOCAR) 1 % ophthalmic solution Place 1 drop into the left eye 3 (three) times daily.    ? Turmeric 500 MG CAPS Take 1,000 mg by mouth every evening.    ? Vitamin D, Ergocalciferol, (DRISDOL) 1.25 MG (50000 UNIT) CAPS capsule Take 50,000 Units by mouth once a week. Saturday    ? ?No current facility-administered medications for this encounter.  ? ? ?Allergies  ?Allergen Reactions  ? Ativan [Lorazepam] Other (See Comments)  ?  "Knocked her out, woke up 3 days later"  ? Augmentin [Amoxicillin-Pot Clavulanate] Nausea And Vomiting  ? Avelox [Moxifloxacin] Itching and Nausea Only  ? Chlorzoxazone Other (See Comments)  ?  Went crazy/loopy  ?  Ciprofloxacin Hcl Itching and Nausea Only  ? Crestor [Rosuvastatin] Other (See Comments)  ?  myalgia  ? Gluten Meal Other (See Comments)  ?  Diarrhea, hot/cold sweat, will sleep for a couple of hours - Celiac disease  ? Nitrofuran Derivatives Itching and Nausea Only  ? Phenergan [Promethazine Hcl] Other (See Comments)  ?  hallucinations  ? Codeine Palpitations  ? Darvon [Propoxyphene Hcl] Palpitations  ? Doxycycline Itching and Nausea Only  ? ? ?Social History  ? ?Socioeconomic History  ? Marital status: Widowed  ?  Spouse name: Not on file  ? Number of children: Not on file  ? Years of education: Not on file  ? Highest education level: Not on file  ?Occupational History  ? Not on file  ?Tobacco Use  ?  Smoking status: Former  ?  Types: Cigarettes  ? Smokeless tobacco: Never  ?Substance and Sexual Activity  ? Alcohol use: Yes  ?  Comment: rare  ? Drug use: No  ? Sexual activity: Not on file  ?Other Topics Concern  ? Not on file  ?Social History Narrative  ? Not on file  ? ?Social Determinants of Health  ? ?Financial Resource Strain: Not on file  ?Food Insecurity: Not on file  ?Transportation Needs: Not on file  ?Physical Activity: Not on file  ?Stress: Not on file  ?Social Connections: Not on file  ?Intimate Partner Violence: Not on file  ? ? ? ?ROS- All systems are reviewed and negative except as per the HPI above. ? ?Physical Exam: ?Vitals:  ? 07/02/21 0935  ?BP: (!) 156/74  ?Pulse: 78  ?Weight: 70.5 kg  ?Height: 5' (1.524 m)  ? ? ?GEN- The patient is a well appearing female, alert and oriented x 3 today.   ?Head- normocephalic, atraumatic ?Eyes-  Sclera clear, conjunctiva pink ?Ears- hearing intact ?Oropharynx- clear ?Neck- supple  ?Lungs- Clear to ausculation bilaterally, normal work of breathing ?Heart- irregular rate and rhythm, no murmurs, rubs or gallops  ?GI- soft, NT, ND, + BS ?Extremities- no clubbing, cyanosis, or edema ?MS- no significant deformity or atrophy ?Skin- no rash or lesion ?Psych- euthymic  mood, full affect ?Neuro- strength and sensation are intact ? ?Wt Readings from Last 3 Encounters:  ?07/02/21 70.5 kg  ?06/25/21 69 kg  ?06/12/21 70.8 kg  ? ? ? ? ? ?Echo 03/16/21 demonstrated  ? 1. Left ventricular

## 2021-07-15 ENCOUNTER — Encounter (HOSPITAL_COMMUNITY): Payer: Self-pay | Admitting: Cardiovascular Disease

## 2021-07-18 ENCOUNTER — Other Ambulatory Visit (HOSPITAL_COMMUNITY): Payer: Self-pay | Admitting: *Deleted

## 2021-07-18 MED ORDER — AMLODIPINE BESYLATE 2.5 MG PO TABS: 2.5000 mg | ORAL_TABLET | Freq: Every day | ORAL | 2 refills | Status: DC

## 2021-07-21 ENCOUNTER — Encounter (HOSPITAL_COMMUNITY): Payer: Self-pay

## 2021-07-22 ENCOUNTER — Other Ambulatory Visit (HOSPITAL_COMMUNITY): Payer: Medicare Other | Admitting: Nurse Practitioner

## 2021-07-22 ENCOUNTER — Encounter (HOSPITAL_COMMUNITY): Payer: Self-pay

## 2021-07-25 ENCOUNTER — Encounter (HOSPITAL_COMMUNITY): Payer: Self-pay

## 2021-07-28 MED ORDER — LOSARTAN POTASSIUM 25 MG PO TABS: 25.0000 mg | ORAL_TABLET | Freq: Every day | ORAL | 3 refills | Status: DC

## 2021-07-29 ENCOUNTER — Ambulatory Visit (HOSPITAL_COMMUNITY): Payer: Medicare Other | Admitting: Nurse Practitioner

## 2021-07-31 ENCOUNTER — Ambulatory Visit (HOSPITAL_COMMUNITY): Payer: Medicare Other | Admitting: Nurse Practitioner

## 2021-08-04 ENCOUNTER — Encounter (HOSPITAL_COMMUNITY): Payer: Self-pay | Admitting: Cardiology

## 2021-08-04 ENCOUNTER — Other Ambulatory Visit (HOSPITAL_COMMUNITY): Payer: Self-pay | Admitting: *Deleted

## 2021-08-04 DIAGNOSIS — I4819 Other persistent atrial fibrillation: Secondary | ICD-10-CM

## 2021-08-04 MED ORDER — AMIODARONE HCL 200 MG PO TABS: 200.0000 mg | ORAL_TABLET | Freq: Two times a day (BID) | ORAL | 0 refills | Status: DC

## 2021-08-05 NOTE — Progress Notes (Signed)
Attempted to obtain medical history via telephone, unable to reach at this time. I left a voicemail to return pre surgical testing department's phone call.  

## 2021-08-11 ENCOUNTER — Encounter (HOSPITAL_COMMUNITY): Payer: Self-pay | Admitting: Cardiology

## 2021-08-11 ENCOUNTER — Ambulatory Visit (HOSPITAL_BASED_OUTPATIENT_CLINIC_OR_DEPARTMENT_OTHER)
Admission: RE | Admit: 2021-08-11 | Discharge: 2021-08-11 | Disposition: A | Discharge status: Home / Self Care | Payer: Medicare Other | Source: Ambulatory Visit | Attending: Physician Assistant | Admitting: Physician Assistant

## 2021-08-11 ENCOUNTER — Ambulatory Visit (HOSPITAL_COMMUNITY): Payer: Medicare Other | Admitting: Certified Registered Nurse Anesthetist

## 2021-08-11 ENCOUNTER — Encounter (HOSPITAL_COMMUNITY)
Admission: RE | Disposition: A | Discharge status: Home / Self Care | Payer: Self-pay | Source: Home / Self Care | Attending: Cardiology

## 2021-08-11 ENCOUNTER — Ambulatory Visit (HOSPITAL_COMMUNITY)
Admission: RE | Admit: 2021-08-11 | Discharge: 2021-08-11 | Disposition: A | Discharge status: Home / Self Care | Payer: Medicare Other | Attending: Cardiology | Admitting: Cardiology

## 2021-08-11 ENCOUNTER — Ambulatory Visit (HOSPITAL_BASED_OUTPATIENT_CLINIC_OR_DEPARTMENT_OTHER): Payer: Medicare Other | Admitting: Certified Registered Nurse Anesthetist

## 2021-08-11 VITALS — BP 150/76 | HR 75 | Ht 60.0 in | Wt 149.8 lb

## 2021-08-11 DIAGNOSIS — Z87891 Personal history of nicotine dependence: Secondary | ICD-10-CM | POA: Insufficient documentation

## 2021-08-11 DIAGNOSIS — I5022 Chronic systolic (congestive) heart failure: Secondary | ICD-10-CM | POA: Insufficient documentation

## 2021-08-11 DIAGNOSIS — I130 Hypertensive heart and chronic kidney disease with heart failure and stage 1 through stage 4 chronic kidney disease, or unspecified chronic kidney disease: Secondary | ICD-10-CM | POA: Insufficient documentation

## 2021-08-11 DIAGNOSIS — J449 Chronic obstructive pulmonary disease, unspecified: Secondary | ICD-10-CM | POA: Diagnosis not present

## 2021-08-11 DIAGNOSIS — K219 Gastro-esophageal reflux disease without esophagitis: Secondary | ICD-10-CM | POA: Insufficient documentation

## 2021-08-11 DIAGNOSIS — E669 Obesity, unspecified: Secondary | ICD-10-CM | POA: Diagnosis not present

## 2021-08-11 DIAGNOSIS — I2510 Atherosclerotic heart disease of native coronary artery without angina pectoris: Secondary | ICD-10-CM | POA: Diagnosis not present

## 2021-08-11 DIAGNOSIS — N189 Chronic kidney disease, unspecified: Secondary | ICD-10-CM | POA: Insufficient documentation

## 2021-08-11 DIAGNOSIS — I4892 Unspecified atrial flutter: Secondary | ICD-10-CM | POA: Diagnosis not present

## 2021-08-11 DIAGNOSIS — I110 Hypertensive heart disease with heart failure: Secondary | ICD-10-CM

## 2021-08-11 DIAGNOSIS — Z79899 Other long term (current) drug therapy: Secondary | ICD-10-CM | POA: Diagnosis not present

## 2021-08-11 DIAGNOSIS — I4819 Other persistent atrial fibrillation: Secondary | ICD-10-CM

## 2021-08-11 DIAGNOSIS — D6869 Other thrombophilia: Secondary | ICD-10-CM | POA: Insufficient documentation

## 2021-08-11 DIAGNOSIS — Z7901 Long term (current) use of anticoagulants: Secondary | ICD-10-CM | POA: Insufficient documentation

## 2021-08-11 DIAGNOSIS — G4733 Obstructive sleep apnea (adult) (pediatric): Secondary | ICD-10-CM | POA: Insufficient documentation

## 2021-08-11 DIAGNOSIS — Z6829 Body mass index (BMI) 29.0-29.9, adult: Secondary | ICD-10-CM | POA: Insufficient documentation

## 2021-08-11 HISTORY — PX: CARDIOVERSION: SHX1299

## 2021-08-11 LAB — BASIC METABOLIC PANEL
Anion gap: 10 (ref 5–15)
BUN: 21 mg/dL (ref 8–23)
CO2: 29 mmol/L (ref 22–32)
Calcium: 10.1 mg/dL (ref 8.9–10.3)
Chloride: 101 mmol/L (ref 98–111)
Creatinine, Ser: 1.67 mg/dL — ABNORMAL HIGH (ref 0.44–1.00)
GFR, Estimated: 30 mL/min — ABNORMAL LOW (ref >60)
Glucose, Bld: 104 mg/dL — ABNORMAL HIGH (ref 70–99)
Potassium: 3.9 mmol/L (ref 3.5–5.1)
Sodium: 140 mmol/L (ref 135–145)

## 2021-08-11 LAB — CBC
HCT: 39.5 % (ref 36.0–46.0)
Hemoglobin: 12.1 g/dL (ref 12.0–15.0)
MCH: 27.6 pg (ref 26.0–34.0)
MCHC: 30.6 g/dL (ref 30.0–36.0)
MCV: 90.2 fL (ref 80.0–100.0)
Platelets: 245 10*3/uL (ref 150–400)
RBC: 4.38 MIL/uL (ref 3.87–5.11)
RDW: 17.5 % — ABNORMAL HIGH (ref 11.5–15.5)
WBC: 6.6 10*3/uL (ref 4.0–10.5)
nRBC: 0 % (ref 0.0–0.2)

## 2021-08-11 SURGERY — CARDIOVERSION
Anesthesia: General

## 2021-08-11 MED ORDER — PROMETHAZINE HCL 25 MG/ML IJ SOLN: 6.2500 mg | INTRAMUSCULAR | Status: DC | PRN

## 2021-08-11 MED ORDER — SODIUM CHLORIDE 0.9 % IV SOLN: INTRAVENOUS | Status: DC

## 2021-08-11 MED ORDER — HYDROMORPHONE HCL 1 MG/ML IJ SOLN: 0.2500 mg | INTRAMUSCULAR | Status: DC | PRN

## 2021-08-11 MED ORDER — OXYCODONE HCL 5 MG PO TABS
5.0000 mg | ORAL_TABLET | Freq: Once | ORAL | Status: DC | PRN
  Filled 2021-08-11: qty 1

## 2021-08-11 MED ORDER — LIDOCAINE 2% (20 MG/ML) 5 ML SYRINGE
INTRAMUSCULAR | Status: DC | PRN
  Administered 2021-08-11: 100 mg via INTRAVENOUS

## 2021-08-11 MED ORDER — SODIUM CHLORIDE 0.9 % IV SOLN: INTRAVENOUS | Status: DC | PRN

## 2021-08-11 MED ORDER — PROPOFOL 10 MG/ML IV BOLUS
INTRAVENOUS | Status: DC | PRN
  Administered 2021-08-11: 70 mg via INTRAVENOUS

## 2021-08-11 MED ORDER — METOPROLOL SUCCINATE ER 100 MG PO TB24: 100.0000 mg | ORAL_TABLET | Freq: Every day | ORAL | 2 refills | Status: DC

## 2021-08-11 MED ORDER — AMIODARONE HCL 200 MG PO TABS: 200.0000 mg | ORAL_TABLET | Freq: Every day | ORAL | Status: DC

## 2021-08-11 MED ORDER — OXYCODONE HCL 5 MG/5ML PO SOLN
5.0000 mg | Freq: Once | ORAL | Status: DC | PRN
  Filled 2021-08-11: qty 5

## 2021-08-11 NOTE — Anesthesia Postprocedure Evaluation (Signed)
Anesthesia Post Note ? ?Patient: Amy Hickman ? ?Procedure(s) Performed: CARDIOVERSION ? ?  ? ?Patient location during evaluation: PACU ?Anesthesia Type: General ?Level of consciousness: awake and alert ?Pain management: pain level controlled ?Vital Signs Assessment: post-procedure vital signs reviewed and stable ?Respiratory status: spontaneous breathing, nonlabored ventilation and respiratory function stable ?Cardiovascular status: blood pressure returned to baseline and stable ?Postop Assessment: no apparent nausea or vomiting ?Anesthetic complications: no ? ? ?No notable events documented. ? ?Last Vitals:  ?Vitals:  ? 08/11/21 1142 08/11/21 1151  ?BP: 140/62 140/64  ?Pulse: (!) 58 62  ?Resp: 14 (!) 24  ?Temp:    ?SpO2: 94% 93%  ?  ?Last Pain:  ?Vitals:  ? 08/11/21 1122  ?TempSrc: Temporal  ?PainSc: Asleep  ? ? ?  ?  ?  ?  ?  ?  ? ?Lynda Rainwater ? ? ? ? ?

## 2021-08-11 NOTE — Addendum Note (Signed)
Encounter addended by: Enid Derry, CMA on: 08/11/2021 10:13 AM ? Actions taken: Order list changed, Diagnosis association updated

## 2021-08-11 NOTE — H&P (View-Only) (Signed)
? ? ?Primary Care Physician: [facility] ?Primary Cardiologist: Dr Harrell Gave  ?Primary Electrophysiologist: none ?Referring Physician: Dr Harrell Gave  ? ? ?Amy Hickman is a 86 y.o. female with a history of hypertension, OSA on CPAP, CAD, CKD, GERD, COPD, HTN, chronic systolic CHF, celiac disease, atrial fibrillation who presents for consultation in the [facility]. Patient was admitted 03/15/21 for sepsis in the setting of influenza A, found to have afib RVR. EF slightly reduced. Difficult to rate control, required both metoprolol and diltiazem. Cardioversion 04/24/2021 got her into sinus rhythm, but she then returned to atrial fibrillation. She was started on amiodarone and had repeat DCCV on 06/12/21. Patient is on Eliquis for a CHADS2VASC score of 6 and is on eliquis 5 mg bid. She is now  in the afib clinic for recent return of rate controlled afib.  ? ?F/u afib clinic, 07/02/21. She remains in a rate controlled atrial flutter. We discussed cardioversion in a few weeks and she and her daughter are in agreement. She continues to reload at 200 mg bid. No missed eliqius.  ? ?Follow up in the AF clinic 08/11/21. Patient presents for DCCV today. She remains in atrial flutter. She denies any missed doses of anticoagulation. Fasting today.  ? ?Today, she has symptoms of  palpitations, mild chest pressure , shortness of breath, no orthopnea, PND, lower extremity edema, dizziness, presyncope, syncope, bleeding, or neurologic sequela. The patient is tolerating medications without difficulties and is otherwise without complaint today.  ? ? ?Atrial Fibrillation Risk Factors: ? ?she does have symptoms or diagnosis of sleep apnea. ?She is compliant with CPAP therapy.  ?she does not have a history of rheumatic fever. ?she does not have a history of alcohol use. ?The patient does not have a history of early familial atrial fibrillation or other arrhythmias. ? ?she  has a BMI of Body mass index is 29.26 kg/m?Marland KitchenMarland Kitchen ?Filed Weights  ? 08/11/21 0901  ?Weight: 67.9 kg  ? ? ? ?Family History  ?Problem Relation Age of Onset  ? Hypertension Other   ? ? ? ?Atrial Fibrillation Management history: ? ?Previous antiarrhythmic drugs: amiodarone  ?Previous cardioversions: 04/24/21, 06/12/21 ?Previous ablations: none ?CHADS2VASC score: 6 ?Anticoagulation history: Eliquis ? ? ?Past Medical History:  ?Diagnosis Date  ? Celiac disease   ? GERD (gastroesophageal reflux disease)   ? HOH (hard of hearing)   ? Hypertension   ? Interstitial cystitis   ? ?Past Surgical History:  ?Procedure Laterality Date  ? ABDOMINAL HYSTERECTOMY    ? APPENDECTOMY    ? CARDIOVERSION N/A 04/24/2021  ? Procedure: CARDIOVERSION;  Surgeon: Jerline Pain, MD;  Location: [facility] ENDOSCOPY;  Service: Cardiovascular;  Laterality: N/A;  ? CARDIOVERSION N/A 06/12/2021  ? Procedure: CARDIOVERSION;  Surgeon: Buford Dresser, MD;  Location: [facility] ENDOSCOPY;  Service: Cardiovascular;  Laterality: N/A;  ? CHOLECYSTECTOMY    ? ear drum surgery    ? REPLACEMENT TOTAL KNEE    ? ROTATOR CUFF REPAIR Right   ? TONSILLECTOMY    ? ? ?Current Outpatient Medications  ?Medication Sig Dispense Refill  ? acetaminophen (TYLENOL) 500 MG tablet Take 1 tablet (500 mg total) by mouth every 6 (six) hours as needed for pain. (Patient taking differently: Take 1,000 mg by mouth as needed for pain.) 30 tablet 0  ? amiodarone (PACERONE) 200 MG tablet Take 1 tablet (200 mg total) by mouth 2 (two) times daily for 360 doses. (Patient taking differently: Take 200 mg by  mouth daily.) 60 tablet 0  ? apixaban (ELIQUIS) 5 MG TABS tablet Take 1 tablet (5 mg total) by mouth 2 (two) times daily. 180 tablet 3  ? atorvastatin (LIPITOR) 10 MG tablet Take 1 tablet (10 mg total) by mouth daily. (Patient taking differently: Take 10 mg by mouth every evening.) 30 tablet 11  ? B Complex Vitamins (VITAMIN B COMPLEX) TABS Take 1 tablet by mouth daily.    ? clidinium-chlordiazePOXIDE  (LIBRAX) 5-2.5 MG capsule Take 2 capsules by mouth daily as needed (cramping).    ? Coenzyme Q10 (COQ10) 100 MG CAPS Take 100 mg by mouth every evening.    ? CRANBERRY-VITAMIN C PO Take 1 capsule by mouth in the morning.    ? diltiazem (CARDIZEM CD) 360 MG 24 hr capsule Take 1 capsule (360 mg total) by mouth daily. 90 capsule 3  ? doxazosin (CARDURA) 4 MG tablet Take 4 mg by mouth daily.    ? esomeprazole (NEXIUM) 40 MG capsule Take 40 mg by mouth daily before breakfast.    ? estradiol (ESTRACE) 0.1 MG/GM vaginal cream Place 1 Applicatorful vaginally daily as needed (irritation/dryness).    ? fenofibrate 54 MG tablet Take 54 mg by mouth daily.    ? furosemide (LASIX) 40 MG tablet Take 1 tablet (40 mg total) by mouth daily. 90 tablet 3  ? Hyoscyamine Sulfate SL 0.125 MG SUBL Place 0.125 mg under the tongue every 4 (four) hours as needed (cramping).    ? latanoprost (XALATAN) 0.005 % ophthalmic solution Place 1 drop into both eyes at bedtime.    ? levothyroxine (SYNTHROID) 75 MCG tablet Take 75 mcg by mouth daily before breakfast.    ? losartan (COZAAR) 25 MG tablet Take 1 tablet (25 mg total) by mouth daily. 90 tablet 3  ? pentosan polysulfate (ELMIRON) 100 MG capsule Take 100 mg by mouth 2 (two) times daily.    ? pilocarpine (PILOCAR) 1 % ophthalmic solution Place 1 drop into the left eye 2 (two) times daily.    ? sodium chloride (OCEAN) 0.65 % SOLN nasal spray Place 1 spray into both nostrils as needed (Allergies).    ? Turmeric 500 MG CAPS Take 500 mg by mouth every evening.    ? Vitamin D, Ergocalciferol, (DRISDOL) 1.25 MG (50000 UNIT) CAPS capsule Take 50,000 Units by mouth once a week. Saturday    ? metoprolol (TOPROL-XL) 100 MG 24 hr tablet Take 1 tablet (100 mg total) by mouth daily. Take with or immediately following a meal. 90 tablet 2  ? ?No current facility-administered medications for this encounter.  ? ? ?Allergies  ?Allergen Reactions  ? Ativan [Lorazepam] Other (See Comments)  ?  "Knocked her out,  woke up 3 days later"  ? Augmentin [Amoxicillin-Pot Clavulanate] Nausea And Vomiting  ? Avelox [Moxifloxacin] Itching and Nausea Only  ? Chlorzoxazone Other (See Comments)  ?  Went crazy/loopy  ? Ciprofloxacin Hcl Itching and Nausea Only  ? Crestor [Rosuvastatin] Other (See Comments)  ?  myalgia  ? Gluten Meal Other (See Comments)  ?  Diarrhea, hot/cold sweat, will sleep for a couple of hours - Celiac disease  ? Nitrofuran Derivatives Itching and Nausea Only  ? Phenergan [Promethazine Hcl] Other (See Comments)  ?  hallucinations  ? Codeine Palpitations  ? Darvon [Propoxyphene Hcl] Palpitations  ? Doxycycline Itching and Nausea Only  ? ? ?Social History  ? ?Socioeconomic History  ? Marital status: Widowed  ?  Spouse name: Not on file  ? Number  of children: Not on file  ? Years of education: Not on file  ? Highest education level: Not on file  ?Occupational History  ? Not on file  ?Tobacco Use  ? Smoking status: Former  ?  Types: Cigarettes  ? Smokeless tobacco: Never  ?Substance and Sexual Activity  ? Alcohol use: Yes  ?  Comment: rare  ? Drug use: No  ? Sexual activity: Not on file  ?Other Topics Concern  ? Not on file  ?Social History Narrative  ? Not on file  ? ?Social Determinants of Health  ? ?Financial Resource Strain: Not on file  ?Food Insecurity: Not on file  ?Transportation Needs: Not on file  ?Physical Activity: Not on file  ?Stress: Not on file  ?Social Connections: Not on file  ?Intimate Partner Violence: Not on file  ? ? ? ?ROS- All systems are reviewed and negative except as per the HPI above. ? ?Physical Exam: ?Vitals:  ? 08/11/21 0901  ?BP: (!) 150/76  ?Pulse: 75  ?Weight: 67.9 kg  ?Height: 5' (1.524 m)  ? ? ? ?GEN- The patient is a well appearing elderly female, alert and oriented x 3 today.   ?HEENT-head normocephalic, atraumatic, sclera clear, conjunctiva pink, hearing intact, trachea midline. ?Lungs- Clear to ausculation bilaterally, normal work of breathing ?Heart- irregular rate and rhythm, no  rubs or gallops, 2/6 systolic murmur  ?GI- soft, NT, ND, + BS ?Extremities- no clubbing, cyanosis, or edema ?MS- no significant deformity or atrophy ?Skin- no rash or lesion ?Psych- euthymic mood, full affe

## 2021-08-11 NOTE — Interval H&P Note (Signed)
History and Physical Interval Note: ? ?08/11/2021 ?10:55 AM ? ?Amy Hickman  has presented today for surgery, with the diagnosis of AFIB.  The various methods of treatment have been discussed with the patient and family. After consideration of risks, benefits and other options for treatment, the patient has consented to  Procedure(s): ?CARDIOVERSION (N/A) as a surgical intervention.  The patient's history has been reviewed, patient examined, no change in status, stable for surgery.  I have reviewed the patient's chart and labs.  Questions were answered to the patient's satisfaction.   ? ? ?[name] ? ? ?

## 2021-08-11 NOTE — Transfer of Care (Signed)
Immediate Anesthesia Transfer of Care Note ? ?Patient: Amy Hickman ? ?Procedure(s) Performed: CARDIOVERSION ? ?Patient Location: PACU ? ?Anesthesia Type:General ? ?Level of Consciousness: drowsy ? ?Airway & Oxygen Therapy: Patient Spontanous Breathing ? ?Post-op Assessment: Report given to RN and Post -op Vital signs reviewed and stable ? ?Post vital signs: Reviewed and stable ? ?Last Vitals:  ?Vitals Value Taken Time  ?BP 119/56 08/11/21 11:22  ?Temp    ?Pulse 56 08/11/21 11:22  ?Resp 16 08/11/21 11:22  ?SpO2 99 08/11/21 11:22  ? ? ?Last Pain:  ?Vitals:  ? 08/11/21 1032  ?TempSrc: Temporal  ?PainSc: 0-No pain  ?   ? ?  ? ?Complications: No notable events documented. ?

## 2021-08-11 NOTE — Anesthesia Procedure Notes (Signed)
Procedure Name: General with mask airway ?Date/Time: 08/11/2021 11:14 AM ?Performed by: Reece Agar, CRNA ?Pre-anesthesia Checklist: Patient identified, Emergency Drugs available, Suction available, Patient being monitored and Timeout performed ?Patient Re-evaluated:Patient Re-evaluated prior to induction ?Oxygen Delivery Method: Ambu bag ?Preoxygenation: Pre-oxygenation with 100% oxygen ?Induction Type: IV induction ?Ventilation: Mask ventilation without difficulty ? ? ? ? ?

## 2021-08-11 NOTE — Discharge Instructions (Signed)

## 2021-08-11 NOTE — Procedures (Signed)
Procedure: Electrical Cardioversion ?Indications:  Atrial Fibrillation ? ?Procedure Details: ? ?Consent: Risks of procedure as well as the alternatives and risks of each were explained to the (patient/caregiver).  Consent for procedure obtained. ? ?Time Out: Verified patient identification, verified procedure, site/side was marked, verified correct patient position, special equipment/implants available, medications/allergies/relevent history reviewed, required imaging and test results available. PERFORMED. ? ?Patient placed on cardiac monitor, pulse oximetry, supplemental oxygen as necessary.  ?Sedation given:  Propofol 77m; lidocaine 1029m?Pacer pads placed anterior and posterior chest. ? ?Cardioverted 2 time(s).  ?Cardioversion with synchronized biphasic 200J, 200J shock. ? ?Evaluation: ?Findings: Post procedure EKG shows:  Sinus bradycardia ?Complications: None ?Patient did tolerate procedure well. ? ?Time Spent Directly with the Patient: ? ?3550mtes  ? ?HeaFreada Bergeron/24/2023, 11:18 AM  ?

## 2021-08-11 NOTE — Progress Notes (Signed)
? ? ?Primary Care Physician: [facility] ?Primary Cardiologist: Dr Harrell Gave  ?Primary Electrophysiologist: none ?Referring Physician: Dr Harrell Gave  ? ? ?Amy Hickman is a 86 y.o. female with a history of hypertension, OSA on CPAP, CAD, CKD, GERD, COPD, HTN, chronic systolic CHF, celiac disease, atrial fibrillation who presents for consultation in the [facility]. Patient was admitted 03/15/21 for sepsis in the setting of influenza A, found to have afib RVR. EF slightly reduced. Difficult to rate control, required both metoprolol and diltiazem. Cardioversion 04/24/2021 got her into sinus rhythm, but she then returned to atrial fibrillation. She was started on amiodarone and had repeat DCCV on 06/12/21. Patient is on Eliquis for a CHADS2VASC score of 6 and is on eliquis 5 mg bid. She is now  in the afib clinic for recent return of rate controlled afib.  ? ?F/u afib clinic, 07/02/21. She remains in a rate controlled atrial flutter. We discussed cardioversion in a few weeks and she and her daughter are in agreement. She continues to reload at 200 mg bid. No missed eliqius.  ? ?Follow up in the AF clinic 08/11/21. Patient presents for DCCV today. She remains in atrial flutter. She denies any missed doses of anticoagulation. Fasting today.  ? ?Today, she has symptoms of  palpitations, mild chest pressure , shortness of breath, no orthopnea, PND, lower extremity edema, dizziness, presyncope, syncope, bleeding, or neurologic sequela. The patient is tolerating medications without difficulties and is otherwise without complaint today.  ? ? ?Atrial Fibrillation Risk Factors: ? ?she does have symptoms or diagnosis of sleep apnea. ?She is compliant with CPAP therapy.  ?she does not have a history of rheumatic fever. ?she does not have a history of alcohol use. ?The patient does not have a history of early familial atrial fibrillation or other arrhythmias. ? ?she  has a BMI of Body mass index is 29.26 kg/m?Marland KitchenMarland Kitchen ?Filed Weights  ? 08/11/21 0901  ?Weight: 67.9 kg  ? ? ? ?Family History  ?Problem Relation Age of Onset  ? Hypertension Other   ? ? ? ?Atrial Fibrillation Management history: ? ?Previous antiarrhythmic drugs: amiodarone  ?Previous cardioversions: 04/24/21, 06/12/21 ?Previous ablations: none ?CHADS2VASC score: 6 ?Anticoagulation history: Eliquis ? ? ?Past Medical History:  ?Diagnosis Date  ? Celiac disease   ? GERD (gastroesophageal reflux disease)   ? HOH (hard of hearing)   ? Hypertension   ? Interstitial cystitis   ? ?Past Surgical History:  ?Procedure Laterality Date  ? ABDOMINAL HYSTERECTOMY    ? APPENDECTOMY    ? CARDIOVERSION N/A 04/24/2021  ? Procedure: CARDIOVERSION;  Surgeon: Jerline Pain, MD;  Location: [facility] ENDOSCOPY;  Service: Cardiovascular;  Laterality: N/A;  ? CARDIOVERSION N/A 06/12/2021  ? Procedure: CARDIOVERSION;  Surgeon: Buford Dresser, MD;  Location: [facility] ENDOSCOPY;  Service: Cardiovascular;  Laterality: N/A;  ? CHOLECYSTECTOMY    ? ear drum surgery    ? REPLACEMENT TOTAL KNEE    ? ROTATOR CUFF REPAIR Right   ? TONSILLECTOMY    ? ? ?Current Outpatient Medications  ?Medication Sig Dispense Refill  ? acetaminophen (TYLENOL) 500 MG tablet Take 1 tablet (500 mg total) by mouth every 6 (six) hours as needed for pain. (Patient taking differently: Take 1,000 mg by mouth as needed for pain.) 30 tablet 0  ? amiodarone (PACERONE) 200 MG tablet Take 1 tablet (200 mg total) by mouth 2 (two) times daily for 360 doses. (Patient taking differently: Take 200 mg by  mouth daily.) 60 tablet 0  ? apixaban (ELIQUIS) 5 MG TABS tablet Take 1 tablet (5 mg total) by mouth 2 (two) times daily. 180 tablet 3  ? atorvastatin (LIPITOR) 10 MG tablet Take 1 tablet (10 mg total) by mouth daily. (Patient taking differently: Take 10 mg by mouth every evening.) 30 tablet 11  ? B Complex Vitamins (VITAMIN B COMPLEX) TABS Take 1 tablet by mouth daily.    ? clidinium-chlordiazePOXIDE  (LIBRAX) 5-2.5 MG capsule Take 2 capsules by mouth daily as needed (cramping).    ? Coenzyme Q10 (COQ10) 100 MG CAPS Take 100 mg by mouth every evening.    ? CRANBERRY-VITAMIN C PO Take 1 capsule by mouth in the morning.    ? diltiazem (CARDIZEM CD) 360 MG 24 hr capsule Take 1 capsule (360 mg total) by mouth daily. 90 capsule 3  ? doxazosin (CARDURA) 4 MG tablet Take 4 mg by mouth daily.    ? esomeprazole (NEXIUM) 40 MG capsule Take 40 mg by mouth daily before breakfast.    ? estradiol (ESTRACE) 0.1 MG/GM vaginal cream Place 1 Applicatorful vaginally daily as needed (irritation/dryness).    ? fenofibrate 54 MG tablet Take 54 mg by mouth daily.    ? furosemide (LASIX) 40 MG tablet Take 1 tablet (40 mg total) by mouth daily. 90 tablet 3  ? Hyoscyamine Sulfate SL 0.125 MG SUBL Place 0.125 mg under the tongue every 4 (four) hours as needed (cramping).    ? latanoprost (XALATAN) 0.005 % ophthalmic solution Place 1 drop into both eyes at bedtime.    ? levothyroxine (SYNTHROID) 75 MCG tablet Take 75 mcg by mouth daily before breakfast.    ? losartan (COZAAR) 25 MG tablet Take 1 tablet (25 mg total) by mouth daily. 90 tablet 3  ? pentosan polysulfate (ELMIRON) 100 MG capsule Take 100 mg by mouth 2 (two) times daily.    ? pilocarpine (PILOCAR) 1 % ophthalmic solution Place 1 drop into the left eye 2 (two) times daily.    ? sodium chloride (OCEAN) 0.65 % SOLN nasal spray Place 1 spray into both nostrils as needed (Allergies).    ? Turmeric 500 MG CAPS Take 500 mg by mouth every evening.    ? Vitamin D, Ergocalciferol, (DRISDOL) 1.25 MG (50000 UNIT) CAPS capsule Take 50,000 Units by mouth once a week. Saturday    ? metoprolol (TOPROL-XL) 100 MG 24 hr tablet Take 1 tablet (100 mg total) by mouth daily. Take with or immediately following a meal. 90 tablet 2  ? ?No current facility-administered medications for this encounter.  ? ? ?Allergies  ?Allergen Reactions  ? Ativan [Lorazepam] Other (See Comments)  ?  "Knocked her out,  woke up 3 days later"  ? Augmentin [Amoxicillin-Pot Clavulanate] Nausea And Vomiting  ? Avelox [Moxifloxacin] Itching and Nausea Only  ? Chlorzoxazone Other (See Comments)  ?  Went crazy/loopy  ? Ciprofloxacin Hcl Itching and Nausea Only  ? Crestor [Rosuvastatin] Other (See Comments)  ?  myalgia  ? Gluten Meal Other (See Comments)  ?  Diarrhea, hot/cold sweat, will sleep for a couple of hours - Celiac disease  ? Nitrofuran Derivatives Itching and Nausea Only  ? Phenergan [Promethazine Hcl] Other (See Comments)  ?  hallucinations  ? Codeine Palpitations  ? Darvon [Propoxyphene Hcl] Palpitations  ? Doxycycline Itching and Nausea Only  ? ? ?Social History  ? ?Socioeconomic History  ? Marital status: Widowed  ?  Spouse name: Not on file  ? Number  of children: Not on file  ? Years of education: Not on file  ? Highest education level: Not on file  ?Occupational History  ? Not on file  ?Tobacco Use  ? Smoking status: Former  ?  Types: Cigarettes  ? Smokeless tobacco: Never  ?Substance and Sexual Activity  ? Alcohol use: Yes  ?  Comment: rare  ? Drug use: No  ? Sexual activity: Not on file  ?Other Topics Concern  ? Not on file  ?Social History Narrative  ? Not on file  ? ?Social Determinants of Health  ? ?Financial Resource Strain: Not on file  ?Food Insecurity: Not on file  ?Transportation Needs: Not on file  ?Physical Activity: Not on file  ?Stress: Not on file  ?Social Connections: Not on file  ?Intimate Partner Violence: Not on file  ? ? ? ?ROS- All systems are reviewed and negative except as per the HPI above. ? ?Physical Exam: ?Vitals:  ? 08/11/21 0901  ?BP: (!) 150/76  ?Pulse: 75  ?Weight: 67.9 kg  ?Height: 5' (1.524 m)  ? ? ? ?GEN- The patient is a well appearing elderly female, alert and oriented x 3 today.   ?HEENT-head normocephalic, atraumatic, sclera clear, conjunctiva pink, hearing intact, trachea midline. ?Lungs- Clear to ausculation bilaterally, normal work of breathing ?Heart- irregular rate and rhythm, no  rubs or gallops, 2/6 systolic murmur  ?GI- soft, NT, ND, + BS ?Extremities- no clubbing, cyanosis, or edema ?MS- no significant deformity or atrophy ?Skin- no rash or lesion ?Psych- euthymic mood, full affe

## 2021-08-11 NOTE — Anesthesia Preprocedure Evaluation (Signed)
Anesthesia Evaluation  ?Patient identified by MRN, date of birth, ID band ?Patient awake ? ? ? ?Reviewed: ?Allergy & Precautions, H&P , NPO status , Patient's Chart, lab work & pertinent test results ? ?Airway ?Mallampati: II ? ? ?Neck ROM: full ? ? ? Dental ? ?(+) Edentulous Upper, Edentulous Lower, Upper Dentures, Lower Dentures ?  ?Pulmonary ?COPD, former smoker,  ?  ?breath sounds clear to auscultation ? ? ? ? ? ? Cardiovascular ?hypertension, +CHF  ?+ dysrhythmias Atrial Fibrillation  ?Rhythm:irregular Rate:Normal ? ? ?  ?Neuro/Psych ?  ? GI/Hepatic ?GERD  Medicated,  ?Endo/Other  ?Hypothyroidism  ? Renal/GU ?  ? ?  ?Musculoskeletal ? ? Abdominal ?  ?Peds ? Hematology ? ?(+) Blood dyscrasia, anemia ,   ?Anesthesia Other Findings ? ? Reproductive/Obstetrics ? ?  ? ? ? ? ? ? ? ? ? ? ? ? ? ?  ?  ? ? ? ? ? ? ? ? ?Anesthesia Physical ? ?Anesthesia Plan ? ?ASA: 3 ? ?Anesthesia Plan: General  ? ?Post-op Pain Management:   ? ?Induction: Intravenous ? ?PONV Risk Score and Plan: 3 and Propofol infusion and Treatment may vary due to age or medical condition ? ?Airway Management Planned: Mask ? ?Additional Equipment:  ? ?Intra-op Plan:  ? ?Post-operative Plan:  ? ?Informed Consent: I have reviewed the patients History and Physical, chart, labs and discussed the procedure including the risks, benefits and alternatives for the proposed anesthesia with the patient or authorized representative who has indicated his/her understanding and acceptance.  ? ? ? ?Dental advisory given ? ?Plan Discussed with: CRNA, Anesthesiologist and Surgeon ? ?Anesthesia Plan Comments:   ? ? ? ? ? ? ?Anesthesia Quick Evaluation ? ?

## 2021-08-13 ENCOUNTER — Encounter (HOSPITAL_COMMUNITY): Payer: Self-pay | Admitting: Cardiology

## 2021-08-21 ENCOUNTER — Ambulatory Visit (HOSPITAL_COMMUNITY)
Admission: RE | Admit: 2021-08-21 | Discharge: 2021-08-21 | Disposition: A | Discharge status: Home / Self Care | Payer: Medicare Other | Source: Ambulatory Visit | Attending: Nurse Practitioner | Admitting: Nurse Practitioner

## 2021-08-21 VITALS — BP 162/70 | HR 57 | Ht 60.0 in | Wt 140.4 lb

## 2021-08-21 DIAGNOSIS — I2510 Atherosclerotic heart disease of native coronary artery without angina pectoris: Secondary | ICD-10-CM | POA: Insufficient documentation

## 2021-08-21 DIAGNOSIS — Z79899 Other long term (current) drug therapy: Secondary | ICD-10-CM | POA: Diagnosis not present

## 2021-08-21 DIAGNOSIS — K219 Gastro-esophageal reflux disease without esophagitis: Secondary | ICD-10-CM | POA: Diagnosis not present

## 2021-08-21 DIAGNOSIS — I5022 Chronic systolic (congestive) heart failure: Secondary | ICD-10-CM | POA: Diagnosis not present

## 2021-08-21 DIAGNOSIS — J449 Chronic obstructive pulmonary disease, unspecified: Secondary | ICD-10-CM | POA: Insufficient documentation

## 2021-08-21 DIAGNOSIS — D6869 Other thrombophilia: Secondary | ICD-10-CM | POA: Insufficient documentation

## 2021-08-21 DIAGNOSIS — I130 Hypertensive heart and chronic kidney disease with heart failure and stage 1 through stage 4 chronic kidney disease, or unspecified chronic kidney disease: Secondary | ICD-10-CM | POA: Insufficient documentation

## 2021-08-21 DIAGNOSIS — G4733 Obstructive sleep apnea (adult) (pediatric): Secondary | ICD-10-CM | POA: Insufficient documentation

## 2021-08-21 DIAGNOSIS — I4892 Unspecified atrial flutter: Secondary | ICD-10-CM | POA: Diagnosis present

## 2021-08-21 DIAGNOSIS — N189 Chronic kidney disease, unspecified: Secondary | ICD-10-CM | POA: Diagnosis not present

## 2021-08-21 DIAGNOSIS — Z7901 Long term (current) use of anticoagulants: Secondary | ICD-10-CM | POA: Diagnosis not present

## 2021-08-21 DIAGNOSIS — I4819 Other persistent atrial fibrillation: Secondary | ICD-10-CM | POA: Diagnosis present

## 2021-08-21 NOTE — Progress Notes (Signed)
? ? ?Primary Care Physician: [facility] ?Primary Cardiologist: Dr Harrell Gave  ?Primary Electrophysiologist: none ?Referring Physician: Dr Harrell Gave  ? ? ?Amy Hickman is a 86 y.o. female with a history of hypertension, OSA on CPAP, CAD, CKD, GERD, COPD, HTN, chronic systolic CHF, celiac disease, atrial fibrillation who presents for consultation in the [facility]. Patient was admitted 03/15/21 for sepsis in the setting of influenza A, found to have afib RVR. EF slightly reduced. Difficult to rate control, required both metoprolol and diltiazem. Cardioversion 04/24/2021 got her into sinus rhythm, but she then returned to atrial fibrillation. She was started on amiodarone and had repeat DCCV on 06/12/21. Patient is on Eliquis for a CHADS2VASC score of 6 and is on eliquis 5 mg bid. She is now  in the afib clinic for recent return of rate controlled afib.  ? ?F/u afib clinic, 07/02/21. She remains in a rate controlled atrial flutter. We discussed cardioversion in a few weeks and she and her daughter are in agreement. She continues to reload at 200 mg bid. No missed eliqius.  ? ?Follow up in the AF clinic 08/11/21. Patient presents for DCCV today. She remains in atrial flutter. She denies any missed doses of anticoagulation. Fasting today.  ? ?F/u in afib clinic, 08/21/21. She had a successful cardioversion and is staying in SR. She feels improved.remains on amiodarone 200 mg daily.  She is going on a trip to  [place], with her family very soon to celebrate her great  granddaughters college graduation.  She is using her CPAP and will take it on her trip.  ? ?Today, she has symptoms of  palpitations, mild chest pressure , shortness of breath, no orthopnea, PND, lower extremity edema, dizziness, presyncope, syncope, bleeding, or neurologic sequela. The patient is tolerating medications without difficulties and is otherwise without complaint today.   ? ? ?Atrial Fibrillation Risk Factors: ? ?she does have symptoms or diagnosis of sleep apnea. ?She is compliant with CPAP therapy.  ?she does not have a history of rheumatic fever. ?she does not have a history of alcohol use. ?The patient does not have a history of early familial atrial fibrillation or other arrhythmias. ? ?she has a BMI of Body mass index is 27.42 kg/m?Marland KitchenMarland Kitchen ?Filed Weights  ? 08/21/21 0912  ?Weight: 63.7 kg  ? ? ? ?Family History  ?Problem Relation Age of Onset  ? Hypertension Other   ? ? ? ?Atrial Fibrillation Management history: ? ?Previous antiarrhythmic drugs: amiodarone  ?Previous cardioversions: 04/24/21, 06/12/21 ?Previous ablations: none ?CHADS2VASC score: 6 ?Anticoagulation history: Eliquis ? ? ?Past Medical History:  ?Diagnosis Date  ? Celiac disease   ? GERD (gastroesophageal reflux disease)   ? HOH (hard of hearing)   ? Hypertension   ? Interstitial cystitis   ? ?Past Surgical History:  ?Procedure Laterality Date  ? ABDOMINAL HYSTERECTOMY    ? APPENDECTOMY    ? CARDIOVERSION N/A 04/24/2021  ? Procedure: CARDIOVERSION;  Surgeon: Jerline Pain, MD;  Location: [facility] ENDOSCOPY;  Service: Cardiovascular;  Laterality: N/A;  ? CARDIOVERSION N/A 06/12/2021  ? Procedure: CARDIOVERSION;  Surgeon: Buford Dresser, MD;  Location: [place];  Service: Cardiovascular;  Laterality: N/A;  ? CARDIOVERSION N/A 08/11/2021  ? Procedure: CARDIOVERSION;  Surgeon: [name], MD;  Location: [facility] ENDOSCOPY;  Service: Cardiovascular;  Laterality: N/A;  ? CHOLECYSTECTOMY    ? ear drum surgery    ? REPLACEMENT TOTAL KNEE    ? ROTATOR  CUFF REPAIR Right   ? TONSILLECTOMY    ? ? ?Current Outpatient Medications  ?Medication Sig Dispense Refill  ? acetaminophen (TYLENOL) 500 MG tablet Take 1 tablet (500 mg total) by mouth every 6 (six) hours as needed for pain. (Patient taking differently: Take 1,000 mg by mouth as needed for pain.) 30 tablet 0  ? amiodarone (PACERONE) 200 MG tablet Take 1 tablet (200 mg total)  by mouth daily for 360 doses.    ? apixaban (ELIQUIS) 5 MG TABS tablet Take 1 tablet (5 mg total) by mouth 2 (two) times daily. 180 tablet 3  ? atorvastatin (LIPITOR) 10 MG tablet Take 1 tablet (10 mg total) by mouth daily. (Patient taking differently: Take 10 mg by mouth every evening.) 30 tablet 11  ? B Complex Vitamins (VITAMIN B COMPLEX) TABS Take 1 tablet by mouth daily.    ? clidinium-chlordiazePOXIDE (LIBRAX) 5-2.5 MG capsule Take 2 capsules by mouth daily as needed (cramping).    ? Coenzyme Q10 (COQ10) 100 MG CAPS Take 100 mg by mouth every evening.    ? CRANBERRY-VITAMIN C PO Take 1 capsule by mouth in the morning.    ? diltiazem (CARDIZEM CD) 360 MG 24 hr capsule Take 1 capsule (360 mg total) by mouth daily. 90 capsule 3  ? doxazosin (CARDURA) 4 MG tablet Take 4 mg by mouth daily.    ? esomeprazole (NEXIUM) 40 MG capsule Take 40 mg by mouth daily before breakfast.    ? fenofibrate 54 MG tablet Take 54 mg by mouth daily.    ? furosemide (LASIX) 40 MG tablet Take 1 tablet (40 mg total) by mouth daily. 90 tablet 3  ? Hyoscyamine Sulfate SL 0.125 MG SUBL Place 0.125 mg under the tongue every 4 (four) hours as needed (cramping).    ? latanoprost (XALATAN) 0.005 % ophthalmic solution Place 1 drop into both eyes at bedtime.    ? levothyroxine (SYNTHROID) 75 MCG tablet Take 75 mcg by mouth daily before breakfast.    ? losartan (COZAAR) 25 MG tablet Take 1 tablet (25 mg total) by mouth daily. 90 tablet 3  ? metoprolol (TOPROL-XL) 100 MG 24 hr tablet Take 1 tablet (100 mg total) by mouth daily. Take with or immediately following a meal. 90 tablet 2  ? pentosan polysulfate (ELMIRON) 100 MG capsule Take 100 mg by mouth 2 (two) times daily.    ? pilocarpine (PILOCAR) 1 % ophthalmic solution Place 1 drop into the left eye 2 (two) times daily.    ? Turmeric 500 MG CAPS Take 500 mg by mouth every evening.    ? Vitamin D, Ergocalciferol, (DRISDOL) 1.25 MG (50000 UNIT) CAPS capsule Take 50,000 Units by mouth once a week.  Saturday    ? ?No current facility-administered medications for this encounter.  ? ? ?Allergies  ?Allergen Reactions  ? Ativan [Lorazepam] Other (See Comments)  ?  "Knocked her out, woke up 3 days later"  ? Augmentin [Amoxicillin-Pot Clavulanate] Nausea And Vomiting  ? Avelox [Moxifloxacin] Itching and Nausea Only  ? Chlorzoxazone Other (See Comments)  ?  Went crazy/loopy  ? Ciprofloxacin Hcl Itching and Nausea Only  ? Crestor [Rosuvastatin] Other (See Comments)  ?  myalgia  ? Gluten Meal Other (See Comments)  ?  Diarrhea, hot/cold sweat, will sleep for a couple of hours - Celiac disease  ? Nitrofuran Derivatives Itching and Nausea Only  ? Phenergan [Promethazine Hcl] Other (See Comments)  ?  hallucinations  ? Codeine Palpitations  ? Darvon [  Propoxyphene Hcl] Palpitations  ? Doxycycline Itching and Nausea Only  ? ? ?Social History  ? ?Socioeconomic History  ? Marital status: Widowed  ?  Spouse name: Not on file  ? Number of children: Not on file  ? Years of education: Not on file  ? Highest education level: Not on file  ?Occupational History  ? Not on file  ?Tobacco Use  ? Smoking status: Former  ?  Types: Cigarettes  ? Smokeless tobacco: Never  ?Substance and Sexual Activity  ? Alcohol use: Yes  ?  Comment: rare  ? Drug use: No  ? Sexual activity: Not on file  ?Other Topics Concern  ? Not on file  ?Social History Narrative  ? Not on file  ? ?Social Determinants of Health  ? ?Financial Resource Strain: Not on file  ?Food Insecurity: Not on file  ?Transportation Needs: Not on file  ?Physical Activity: Not on file  ?Stress: Not on file  ?Social Connections: Not on file  ?Intimate Partner Violence: Not on file  ? ? ? ?ROS- All systems are reviewed and negative except as per the HPI above. ? ?Physical Exam: ?Vitals:  ? 08/21/21 0912  ?BP: (!) 162/70  ?Pulse: (!) 57  ?Weight: 63.7 kg  ?Height: 5' (1.524 m)  ? ? ? ?GEN- The patient is a well appearing elderly female, alert and oriented x 3 today.   ?HEENT-head  normocephalic, atraumatic, sclera clear, conjunctiva pink, hearing intact, trachea midline. ?Lungs- Clear to ausculation bilaterally, normal work of breathing ?Heart- irregular rate and rhythm, no rubs or gallops, 2/6 sys

## 2021-08-31 ENCOUNTER — Other Ambulatory Visit (HOSPITAL_COMMUNITY): Payer: Self-pay | Admitting: Nurse Practitioner

## 2021-08-31 DIAGNOSIS — I4819 Other persistent atrial fibrillation: Secondary | ICD-10-CM

## 2021-09-01 ENCOUNTER — Other Ambulatory Visit (HOSPITAL_COMMUNITY): Payer: Self-pay | Admitting: *Deleted

## 2021-09-01 DIAGNOSIS — I4819 Other persistent atrial fibrillation: Secondary | ICD-10-CM

## 2021-09-01 MED ORDER — AMIODARONE HCL 200 MG PO TABS: 200.0000 mg | ORAL_TABLET | Freq: Every day | ORAL | 6 refills | Status: DC

## 2021-09-12 ENCOUNTER — Telehealth: Payer: Self-pay | Admitting: Cardiology

## 2021-09-12 DIAGNOSIS — I4819 Other persistent atrial fibrillation: Secondary | ICD-10-CM

## 2021-09-12 NOTE — Telephone Encounter (Signed)
*  STAT* If patient is at the pharmacy, call can be transferred to refill team.   1. Which medications need to be refilled? (please list name of each medication and dose if known)  apixaban (ELIQUIS) 5 MG TABS tablet  2. Which pharmacy/location (including street and city if local pharmacy) is medication to be sent to? [facility] [place], [place]  3. Do they need a 30 day or 90 day supply?   90 day supply + refills if possible

## 2021-09-16 MED ORDER — APIXABAN 2.5 MG PO TABS: 2.5000 mg | ORAL_TABLET | Freq: Two times a day (BID) | ORAL | 1 refills | Status: DC

## 2021-09-16 NOTE — Telephone Encounter (Signed)
Prescription refill request for Eliquis received. Indication: Afib/ DVT  Last office visit: 08/21/21 Kayleen Memos)  Scr: 1.67 (08/11/21) , 1.57 (05/30/21)  Age: 86 Weight: 64.7kg  Per Fuller Canada, PharmD, pt's dose should be decreased to 2.60m BID. Called pt and made her aware of dose change. Appropriate dose and refill sent to requested pharmacy.

## 2021-10-24 ENCOUNTER — Other Ambulatory Visit (HOSPITAL_COMMUNITY): Payer: Self-pay | Admitting: *Deleted

## 2021-10-24 DIAGNOSIS — I4819 Other persistent atrial fibrillation: Secondary | ICD-10-CM

## 2021-10-24 MED ORDER — AMIODARONE HCL 200 MG PO TABS: 200.0000 mg | ORAL_TABLET | Freq: Every day | ORAL | 1 refills | Status: DC

## 2021-12-22 ENCOUNTER — Other Ambulatory Visit: Payer: Self-pay

## 2021-12-22 ENCOUNTER — Observation Stay (HOSPITAL_BASED_OUTPATIENT_CLINIC_OR_DEPARTMENT_OTHER)
Admission: EM | Admit: 2021-12-22 | Discharge: 2021-12-27 | Disposition: A | Discharge status: Home / Self Care | Payer: Medicare Other | Attending: Internal Medicine | Admitting: Internal Medicine

## 2021-12-22 ENCOUNTER — Emergency Department (HOSPITAL_BASED_OUTPATIENT_CLINIC_OR_DEPARTMENT_OTHER): Payer: Medicare Other

## 2021-12-22 ENCOUNTER — Encounter (HOSPITAL_BASED_OUTPATIENT_CLINIC_OR_DEPARTMENT_OTHER): Payer: Self-pay

## 2021-12-22 DIAGNOSIS — Z87891 Personal history of nicotine dependence: Secondary | ICD-10-CM | POA: Insufficient documentation

## 2021-12-22 DIAGNOSIS — N184 Chronic kidney disease, stage 4 (severe): Secondary | ICD-10-CM | POA: Insufficient documentation

## 2021-12-22 DIAGNOSIS — K900 Celiac disease: Secondary | ICD-10-CM | POA: Diagnosis present

## 2021-12-22 DIAGNOSIS — E785 Hyperlipidemia, unspecified: Secondary | ICD-10-CM | POA: Diagnosis present

## 2021-12-22 DIAGNOSIS — R079 Chest pain, unspecified: Secondary | ICD-10-CM | POA: Diagnosis present

## 2021-12-22 DIAGNOSIS — I482 Chronic atrial fibrillation, unspecified: Secondary | ICD-10-CM | POA: Diagnosis present

## 2021-12-22 DIAGNOSIS — R0789 Other chest pain: Principal | ICD-10-CM | POA: Insufficient documentation

## 2021-12-22 DIAGNOSIS — Z79899 Other long term (current) drug therapy: Secondary | ICD-10-CM | POA: Diagnosis not present

## 2021-12-22 DIAGNOSIS — E440 Moderate protein-calorie malnutrition: Secondary | ICD-10-CM | POA: Diagnosis not present

## 2021-12-22 DIAGNOSIS — Z96659 Presence of unspecified artificial knee joint: Secondary | ICD-10-CM | POA: Diagnosis not present

## 2021-12-22 DIAGNOSIS — I130 Hypertensive heart and chronic kidney disease with heart failure and stage 1 through stage 4 chronic kidney disease, or unspecified chronic kidney disease: Secondary | ICD-10-CM | POA: Insufficient documentation

## 2021-12-22 DIAGNOSIS — Z7901 Long term (current) use of anticoagulants: Secondary | ICD-10-CM | POA: Insufficient documentation

## 2021-12-22 DIAGNOSIS — J449 Chronic obstructive pulmonary disease, unspecified: Secondary | ICD-10-CM | POA: Insufficient documentation

## 2021-12-22 DIAGNOSIS — Z66 Do not resuscitate: Secondary | ICD-10-CM | POA: Diagnosis not present

## 2021-12-22 DIAGNOSIS — I2510 Atherosclerotic heart disease of native coronary artery without angina pectoris: Secondary | ICD-10-CM | POA: Insufficient documentation

## 2021-12-22 DIAGNOSIS — I5022 Chronic systolic (congestive) heart failure: Secondary | ICD-10-CM | POA: Diagnosis not present

## 2021-12-22 DIAGNOSIS — N3010 Interstitial cystitis (chronic) without hematuria: Secondary | ICD-10-CM | POA: Diagnosis present

## 2021-12-22 DIAGNOSIS — I10 Essential (primary) hypertension: Secondary | ICD-10-CM | POA: Diagnosis present

## 2021-12-22 DIAGNOSIS — I5032 Chronic diastolic (congestive) heart failure: Secondary | ICD-10-CM

## 2021-12-22 DIAGNOSIS — E039 Hypothyroidism, unspecified: Secondary | ICD-10-CM | POA: Diagnosis not present

## 2021-12-22 HISTORY — DX: Chronic systolic (congestive) heart failure: I50.22

## 2021-12-22 HISTORY — DX: Chronic atrial fibrillation, unspecified: I48.20

## 2021-12-22 LAB — TROPONIN I (HIGH SENSITIVITY): Troponin I (High Sensitivity): 7 ng/L (ref <18)

## 2021-12-22 LAB — CBC
HCT: 37.7 % (ref 36.0–46.0)
Hemoglobin: 12.3 g/dL (ref 12.0–15.0)
MCH: 30.8 pg (ref 26.0–34.0)
MCHC: 32.6 g/dL (ref 30.0–36.0)
MCV: 94.5 fL (ref 80.0–100.0)
Platelets: 226 10*3/uL (ref 150–400)
RBC: 3.99 MIL/uL (ref 3.87–5.11)
RDW: 15.1 % (ref 11.5–15.5)
WBC: 5.9 10*3/uL (ref 4.0–10.5)
nRBC: 0 % (ref 0.0–0.2)

## 2021-12-22 LAB — BASIC METABOLIC PANEL
Anion gap: 8 (ref 5–15)
BUN: 36 mg/dL — ABNORMAL HIGH (ref 8–23)
CO2: 25 mmol/L (ref 22–32)
Calcium: 9.5 mg/dL (ref 8.9–10.3)
Chloride: 106 mmol/L (ref 98–111)
Creatinine, Ser: 1.76 mg/dL — ABNORMAL HIGH (ref 0.44–1.00)
GFR, Estimated: 28 mL/min — ABNORMAL LOW (ref >60)
Glucose, Bld: 116 mg/dL — ABNORMAL HIGH (ref 70–99)
Potassium: 4.3 mmol/L (ref 3.5–5.1)
Sodium: 139 mmol/L (ref 135–145)

## 2021-12-22 MED ORDER — ONDANSETRON HCL 4 MG/2ML IJ SOLN
4.0000 mg | Freq: Once | INTRAMUSCULAR | Status: AC
  Administered 2021-12-22: 4 mg via INTRAVENOUS
  Filled 2021-12-22: qty 2

## 2021-12-22 MED ORDER — ASPIRIN 81 MG PO CHEW
324.0000 mg | CHEWABLE_TABLET | Freq: Once | ORAL | Status: AC
Start: 2021-12-22 — End: 2021-12-22
  Administered 2021-12-22: 324 mg via ORAL
  Filled 2021-12-22: qty 4

## 2021-12-22 MED ORDER — NITROGLYCERIN 0.4 MG SL SUBL
0.4000 mg | SUBLINGUAL_TABLET | SUBLINGUAL | Status: DC | PRN
  Administered 2021-12-22 – 2021-12-23 (×6): 0.4 mg via SUBLINGUAL
  Filled 2021-12-22 (×2): qty 1

## 2021-12-22 MED ORDER — SODIUM CHLORIDE 0.9 % IV BOLUS
1000.0000 mL | Freq: Once | INTRAVENOUS | Status: AC
  Administered 2021-12-22: 1000 mL via INTRAVENOUS

## 2021-12-22 NOTE — ED Triage Notes (Signed)
Pt reporting CP that began at 4pm today. Reports that it feels heavy. Took a nap and pain went away; it began to come back again. Denies N/V/D. Hx of afib and cardioversion; last had one in June.

## 2021-12-22 NOTE — ED Notes (Signed)
Pt ambulatory to bathroom, one person assistance.

## 2021-12-22 NOTE — ED Provider Notes (Signed)
[facility] Provider Note   CSN: [id number] Arrival date & time: 12/22/21  2105     History  Chief Complaint  Patient presents with   Chest Pain    Amy Hickman is a 86 y.o. female.  With past medical history of celiac's disease, hypertension, atrial fibrillation s/p cardioversion on amiodarone and Eliquis who presents to the emergency department with chest pain.  States that symptoms began around 4 PM today reports that she had an onset of left-sided chest heaviness.  She states that the pain subsided and then returned and has persisted.  She describes having some radiation to her left neck.  She states she has intermittently felt short of breath and had palpitations.  Denies vomiting or diaphoresis.  She denies lightheadedness or dizziness, cough or fever, lower extremity swelling.  She is currently having 8/10 chest pain.   Chest Pain Associated symptoms: palpitations and shortness of breath   Associated symptoms: no cough, no diaphoresis, no fever and no nausea        Home Medications Prior to Admission medications   Medication Sig Start Date End Date Taking? Authorizing Provider  acetaminophen (TYLENOL) 500 MG tablet Take 1 tablet (500 mg total) by mouth every 6 (six) hours as needed for pain. Patient taking differently: Take 1,000 mg by mouth as needed for pain. 06/24/12   [name], MD  amiodarone (PACERONE) 200 MG tablet Take 1 tablet (200 mg total) by mouth daily. 10/24/21   Fenton, Clint R, PA  apixaban (ELIQUIS) 2.5 MG TABS tablet Take 1 tablet (2.5 mg total) by mouth 2 (two) times daily. 09/16/21   Buford Dresser, MD  atorvastatin (LIPITOR) 10 MG tablet Take 1 tablet (10 mg total) by mouth daily. Patient taking differently: Take 10 mg by mouth every evening. 03/21/21 03/21/22  Georgette Shell, MD  B Complex Vitamins (VITAMIN B COMPLEX) TABS Take 1 tablet by mouth daily.    [provider]   clidinium-chlordiazePOXIDE (LIBRAX) 5-2.5 MG capsule Take 2 capsules by mouth daily as needed (cramping). 02/28/21   [provider]  Coenzyme Q10 (COQ10) 100 MG CAPS Take 100 mg by mouth every evening.    [provider]  CRANBERRY-VITAMIN C PO Take 1 capsule by mouth in the morning.    [provider]  diltiazem (CARDIZEM CD) 360 MG 24 hr capsule Take 1 capsule (360 mg total) by mouth daily. 06/12/21   Buford Dresser, MD  doxazosin (CARDURA) 4 MG tablet Take 4 mg by mouth daily.    [provider]  esomeprazole (NEXIUM) 40 MG capsule Take 40 mg by mouth daily before breakfast.    [provider]  fenofibrate 54 MG tablet Take 54 mg by mouth daily.    [provider]  furosemide (LASIX) 40 MG tablet Take 1 tablet (40 mg total) by mouth daily. 04/08/21   Buford Dresser, MD  Hyoscyamine Sulfate SL 0.125 MG SUBL Place 0.125 mg under the tongue every 4 (four) hours as needed (cramping). 04/24/21   [provider]  latanoprost (XALATAN) 0.005 % ophthalmic solution Place 1 drop into both eyes at bedtime. 03/11/21   [provider]  levothyroxine (SYNTHROID) 75 MCG tablet Take 75 mcg by mouth daily before breakfast.    [provider]  losartan (COZAAR) 25 MG tablet Take 1 tablet (25 mg total) by mouth daily. 07/28/21 10/26/21  Sherran Needs, NP  metoprolol (TOPROL-XL) 100 MG 24 hr tablet Take 1 tablet (100 mg  total) by mouth daily. Take with or immediately following a meal. 08/11/21 08/06/22  Sherran Needs, NP  pentosan polysulfate (ELMIRON) 100 MG capsule Take 100 mg by mouth 2 (two) times daily.    [provider]  pilocarpine (PILOCAR) 1 % ophthalmic solution Place 1 drop into the left eye 2 (two) times daily. 02/13/21   [provider]  Turmeric 500 MG CAPS Take 500 mg by mouth every evening.    [provider]  Vitamin D, Ergocalciferol, (DRISDOL) 1.25 MG (50000 UNIT) CAPS  capsule Take 50,000 Units by mouth once a week. Saturday 05/22/21   [provider]      Allergies    Ativan [lorazepam], Augmentin [amoxicillin-pot clavulanate], Avelox [moxifloxacin], Chlorzoxazone, Ciprofloxacin hcl, Crestor [rosuvastatin], Gluten meal, Nitrofuran derivatives, Phenergan [promethazine hcl], Codeine, Darvon [propoxyphene hcl], and Doxycycline    Review of Systems   Review of Systems  Constitutional:  Negative for diaphoresis and fever.  Respiratory:  Positive for shortness of breath. Negative for cough.   Cardiovascular:  Positive for chest pain and palpitations.  Gastrointestinal:  Negative for nausea.  Neurological:  Negative for syncope.  All other systems reviewed and are negative.   Physical Exam Updated Vital Signs BP 127/61   Pulse (!) 59   Temp 97.8 F (36.6 C) (Oral)   Resp 16   Ht 5' (1.524 m)   Wt 64 kg   SpO2 92%   BMI 27.54 kg/m  Physical Exam Vitals and nursing note reviewed.  Constitutional:      General: She is not in acute distress.    Appearance: Normal appearance. She is well-developed. She is not ill-appearing or toxic-appearing.  HENT:     Head: Normocephalic and atraumatic.  Eyes:     General: No scleral icterus.    Extraocular Movements: Extraocular movements intact.     Pupils: Pupils are equal, round, and reactive to light.  Cardiovascular:     Rate and Rhythm: Normal rate and regular rhythm.     Pulses: Normal pulses.          Radial pulses are 2+ on the right side and 2+ on the left side.     Heart sounds: Murmur heard.     Systolic murmur is present.  Pulmonary:     Effort: Pulmonary effort is normal. No tachypnea or respiratory distress.     Breath sounds: Normal breath sounds.  Chest:     Chest wall: Tenderness present.     Comments: Soreness to palpation of the left chest wall Abdominal:     General: Bowel sounds are normal.     Palpations: Abdomen is soft.  Musculoskeletal:        General: Normal range of  motion.     Cervical back: Normal range of motion and neck supple.     Right lower leg: No edema.     Left lower leg: No edema.  Skin:    General: Skin is warm and dry.     Capillary Refill: Capillary refill takes less than 2 seconds.  Neurological:     General: No focal deficit present.     Mental Status: She is alert and oriented to person, place, and time. Mental status is at baseline.  Psychiatric:        Mood and Affect: Mood normal.        Behavior: Behavior normal.        Thought Content: Thought content normal.        Judgment: Judgment  normal.    ED Results / Procedures / Treatments   Labs (all labs ordered are listed, but only abnormal results are displayed) Labs Reviewed  BASIC METABOLIC PANEL - Abnormal; Notable for the following components:      Result Value   Glucose, Bld 116 (*)    BUN 36 (*)    Creatinine, Ser 1.76 (*)    GFR, Estimated 28 (*)    All other components within normal limits  CBC  TROPONIN I (HIGH SENSITIVITY)  TROPONIN I (HIGH SENSITIVITY)    EKG None  Radiology DG Chest 2 View  Result Date: 12/22/2021 CLINICAL DATA:  Chest pain. EXAM: CHEST - 2 VIEW COMPARISON:  Radiograph 03/18/2021 FINDINGS: The cardiomediastinal contours are normal. The lungs are clear. Pulmonary vasculature is normal. No consolidation, pleural effusion, or pneumothorax. Bilateral shoulder arthroplasties. No acute osseous abnormalities are seen. Surgical clips in the upper abdomen. IVC filter is partially visualized. IMPRESSION: No acute chest findings. Electronically Signed   By: Keith Rake M.D.   On: 12/22/2021 22:31    Procedures Procedures    Medications Ordered in ED Medications  nitroGLYCERIN (NITROSTAT) SL tablet 0.4 mg (0.4 mg Sublingual Given 12/22/21 2333)  aspirin chewable tablet 324 mg (324 mg Oral Given 12/22/21 2319)  ondansetron (ZOFRAN) injection 4 mg (4 mg Intravenous Given 12/22/21 2346)  sodium chloride 0.9 % bolus 1,000 mL (1,000 mLs Intravenous  New Bag/Given 12/22/21 2350)    ED Course/ Medical Decision Making/ A&P                           Medical Decision Making Amount and/or Complexity of Data Reviewed Labs: ordered. Radiology: ordered.  Risk OTC drugs. Prescription drug management.   This patient presents to the ED with chief complaint(s) of chest pain with pertinent past medical history of hypertension, atrial fibrillation, systolic HF, HLD, CAD, COPD which further complicates the presenting complaint. The complaint involves an extensive differential diagnosis and also carries with it a high risk of complications and morbidity.    The differential diagnosis includes Acute chest syndrome, stable angina, atypical angina, pulmonary embolism, pneumothorax, aortic dissection, pleural effusion, CHF, COPD, asthma, myocarditis, pericarditis, cardiac tamponade, chest wall pain    Additional history obtained: Additional history obtained from family Records reviewed Care Everywhere/External Records and Primary Care Documents  ED Course and Reassessment: 86 year old female who presents to the emergency department with chest pain. Physical exam is notable for a nontoxic, nonseptic appearing female.  She does have systolic murmur on exam.  Lungs are clear.  Not hypoxic. Her symptoms are concerning for cardiac chest pain.  She is having heaviness that is radiating to the neck nonexertional. Obtain chest pain work-up.  Her EKG appears to be somewhat involved.  She has new T wave inversion in V3. Given her current 8/10 chest pain, given 325 of aspirin and nitroglycerin. Her chest x-ray is negative After nitroglycerin, the patient had a large drop in blood pressure and became nauseated without vomiting.  She was given 4 of Zofran and about 400 mL fluid bolus with return of normotension and improvement in nausea.  Initial troponin is negative. I consulted and spoke with Dr. Blossom Hoops, cardiology who feels it is reasonable to have patient  admitted for observation and stress testing by hospitalist team. If there are concerning findings on stress testing they will continue to consult along.  I discussed this with patient and family who agree with admission.   Patient  handed off to [place], [place]. Patient is currently pending admission and call back from hospitalist.   Independent labs interpretation:  The following labs were independently interpreted:  CBC within normal limits  BMP Cr 1.76  Troponin x1 negative, delta pending  Independent visualization of imaging: - I independently visualized the following imaging with scope of interpretation limited to determining acute life threatening conditions related to emergency care: CXR, which revealed no acute findings  Consultation: - Consulted or discussed management/test interpretation w/ external professional: Dr. Blossom Hoops, Cardiology, who agrees with patient needing admission for stress testing.  Consideration for admission or further workup: admission for stress testing  Social Determinants of health: none identified  Final Clinical Impression(s) / ED Diagnoses Final diagnoses:  Chest pain, unspecified type    Rx / DC Orders ED Discharge Orders     None         Mickie Hillier, PA-C 12/23/21 0023    Elgie Congo, MD 12/23/21 [phone]

## 2021-12-22 NOTE — ED Notes (Signed)
Pt reports nausea; meds given. Pt BP 110/62 repeat

## 2021-12-23 ENCOUNTER — Other Ambulatory Visit: Payer: Self-pay

## 2021-12-23 ENCOUNTER — Encounter (HOSPITAL_COMMUNITY): Payer: Self-pay | Admitting: Internal Medicine

## 2021-12-23 DIAGNOSIS — R079 Chest pain, unspecified: Secondary | ICD-10-CM | POA: Diagnosis present

## 2021-12-23 DIAGNOSIS — K900 Celiac disease: Secondary | ICD-10-CM | POA: Diagnosis not present

## 2021-12-23 DIAGNOSIS — Z7901 Long term (current) use of anticoagulants: Secondary | ICD-10-CM | POA: Diagnosis not present

## 2021-12-23 DIAGNOSIS — Z66 Do not resuscitate: Secondary | ICD-10-CM | POA: Diagnosis not present

## 2021-12-23 DIAGNOSIS — I10 Essential (primary) hypertension: Secondary | ICD-10-CM

## 2021-12-23 DIAGNOSIS — Z79899 Other long term (current) drug therapy: Secondary | ICD-10-CM | POA: Diagnosis not present

## 2021-12-23 DIAGNOSIS — I482 Chronic atrial fibrillation, unspecified: Secondary | ICD-10-CM | POA: Diagnosis not present

## 2021-12-23 DIAGNOSIS — E782 Mixed hyperlipidemia: Secondary | ICD-10-CM

## 2021-12-23 DIAGNOSIS — N184 Chronic kidney disease, stage 4 (severe): Secondary | ICD-10-CM | POA: Diagnosis not present

## 2021-12-23 DIAGNOSIS — E039 Hypothyroidism, unspecified: Secondary | ICD-10-CM | POA: Diagnosis not present

## 2021-12-23 DIAGNOSIS — E785 Hyperlipidemia, unspecified: Secondary | ICD-10-CM | POA: Diagnosis not present

## 2021-12-23 DIAGNOSIS — I130 Hypertensive heart and chronic kidney disease with heart failure and stage 1 through stage 4 chronic kidney disease, or unspecified chronic kidney disease: Secondary | ICD-10-CM | POA: Diagnosis not present

## 2021-12-23 DIAGNOSIS — I2510 Atherosclerotic heart disease of native coronary artery without angina pectoris: Secondary | ICD-10-CM | POA: Diagnosis not present

## 2021-12-23 DIAGNOSIS — I5032 Chronic diastolic (congestive) heart failure: Secondary | ICD-10-CM | POA: Diagnosis not present

## 2021-12-23 DIAGNOSIS — Z96659 Presence of unspecified artificial knee joint: Secondary | ICD-10-CM | POA: Diagnosis not present

## 2021-12-23 DIAGNOSIS — R072 Precordial pain: Secondary | ICD-10-CM | POA: Diagnosis not present

## 2021-12-23 DIAGNOSIS — R0789 Other chest pain: Secondary | ICD-10-CM | POA: Diagnosis present

## 2021-12-23 DIAGNOSIS — E440 Moderate protein-calorie malnutrition: Secondary | ICD-10-CM | POA: Diagnosis not present

## 2021-12-23 DIAGNOSIS — I5022 Chronic systolic (congestive) heart failure: Secondary | ICD-10-CM | POA: Diagnosis not present

## 2021-12-23 DIAGNOSIS — Z87891 Personal history of nicotine dependence: Secondary | ICD-10-CM | POA: Diagnosis not present

## 2021-12-23 DIAGNOSIS — N3010 Interstitial cystitis (chronic) without hematuria: Secondary | ICD-10-CM

## 2021-12-23 DIAGNOSIS — J449 Chronic obstructive pulmonary disease, unspecified: Secondary | ICD-10-CM | POA: Diagnosis not present

## 2021-12-23 LAB — TROPONIN I (HIGH SENSITIVITY)
Troponin I (High Sensitivity): 7 ng/L (ref <18)
Troponin I (High Sensitivity): 7 ng/L (ref <18)
Troponin I (High Sensitivity): 8 ng/L (ref <18)
Troponin I (High Sensitivity): 9 ng/L (ref <18)
Troponin I (High Sensitivity): 9 ng/L (ref <18)

## 2021-12-23 LAB — SEDIMENTATION RATE: Sed Rate: 42 mm/hr — ABNORMAL HIGH (ref 0–22)

## 2021-12-23 MED ORDER — ACETAMINOPHEN 325 MG PO TABS
650.0000 mg | ORAL_TABLET | Freq: Four times a day (QID) | ORAL | Status: DC | PRN
  Administered 2021-12-23 – 2021-12-24 (×3): 650 mg via ORAL
  Filled 2021-12-23 (×3): qty 2

## 2021-12-23 MED ORDER — HYDRALAZINE HCL 20 MG/ML IJ SOLN: 10.0000 mg | Freq: Once | INTRAMUSCULAR | Status: DC

## 2021-12-23 MED ORDER — PILOCARPINE HCL 1 % OP SOLN
1.0000 [drp] | Freq: Two times a day (BID) | OPHTHALMIC | Status: DC
  Administered 2021-12-23 – 2021-12-27 (×8): 1 [drp] via OPHTHALMIC
  Filled 2021-12-23: qty 15

## 2021-12-23 MED ORDER — NETARSUDIL-LATANOPROST 0.02-0.005 % OP SOLN
1.0000 [drp] | Freq: Every day | OPHTHALMIC | Status: DC
Start: 2021-12-23 — End: 2021-12-23

## 2021-12-23 MED ORDER — LEVOTHYROXINE SODIUM 75 MCG PO TABS
75.0000 ug | ORAL_TABLET | Freq: Every day | ORAL | Status: DC
  Administered 2021-12-24 – 2021-12-27 (×4): 75 ug via ORAL
  Filled 2021-12-23 (×4): qty 1

## 2021-12-23 MED ORDER — ONDANSETRON HCL 4 MG/2ML IJ SOLN
4.0000 mg | Freq: Once | INTRAMUSCULAR | Status: AC
  Administered 2021-12-23: 4 mg via INTRAVENOUS
  Filled 2021-12-23: qty 2

## 2021-12-23 MED ORDER — PANTOPRAZOLE SODIUM 40 MG PO TBEC
40.0000 mg | DELAYED_RELEASE_TABLET | Freq: Every day | ORAL | Status: DC
  Administered 2021-12-24: 40 mg via ORAL
  Filled 2021-12-23: qty 1

## 2021-12-23 MED ORDER — HYDRALAZINE HCL 20 MG/ML IJ SOLN
5.0000 mg | INTRAMUSCULAR | Status: DC | PRN
Start: 2021-12-23 — End: 2021-12-23

## 2021-12-23 MED ORDER — ALBUTEROL SULFATE (2.5 MG/3ML) 0.083% IN NEBU: 2.5000 mg | INHALATION_SOLUTION | RESPIRATORY_TRACT | Status: DC | PRN

## 2021-12-23 MED ORDER — CILIDINIUM-CHLORDIAZEPOXIDE 2.5-5 MG PO CAPS: 2.0000 | ORAL_CAPSULE | Freq: Every day | ORAL | Status: DC | PRN

## 2021-12-23 MED ORDER — ACETAMINOPHEN 650 MG RE SUPP: 650.0000 mg | Freq: Four times a day (QID) | RECTAL | Status: DC | PRN

## 2021-12-23 MED ORDER — APIXABAN 2.5 MG PO TABS
2.5000 mg | ORAL_TABLET | Freq: Two times a day (BID) | ORAL | Status: DC
  Administered 2021-12-23 – 2021-12-24 (×3): 2.5 mg via ORAL
  Filled 2021-12-23 (×3): qty 1

## 2021-12-23 MED ORDER — HYDRALAZINE HCL 20 MG/ML IJ SOLN: 10.0000 mg | INTRAMUSCULAR | Status: DC | PRN

## 2021-12-23 MED ORDER — ATORVASTATIN CALCIUM 10 MG PO TABS
10.0000 mg | ORAL_TABLET | Freq: Every evening | ORAL | Status: DC
  Administered 2021-12-23 – 2021-12-26 (×4): 10 mg via ORAL
  Filled 2021-12-23 (×5): qty 1

## 2021-12-23 MED ORDER — DILTIAZEM HCL ER COATED BEADS 180 MG PO CP24
360.0000 mg | ORAL_CAPSULE | Freq: Every day | ORAL | Status: DC
  Administered 2021-12-24 – 2021-12-27 (×4): 360 mg via ORAL
  Filled 2021-12-23 (×5): qty 2

## 2021-12-23 MED ORDER — DOCUSATE SODIUM 100 MG PO CAPS
100.0000 mg | ORAL_CAPSULE | Freq: Two times a day (BID) | ORAL | Status: DC
  Administered 2021-12-23 – 2021-12-27 (×8): 100 mg via ORAL
  Filled 2021-12-23 (×8): qty 1

## 2021-12-23 MED ORDER — FENTANYL CITRATE PF 50 MCG/ML IJ SOSY
25.0000 ug | PREFILLED_SYRINGE | Freq: Once | INTRAMUSCULAR | Status: AC
  Administered 2021-12-23: 25 ug via INTRAVENOUS
  Filled 2021-12-23: qty 1

## 2021-12-23 MED ORDER — METOPROLOL SUCCINATE ER 100 MG PO TB24
100.0000 mg | ORAL_TABLET | Freq: Every evening | ORAL | Status: DC
  Administered 2021-12-24 – 2021-12-26 (×3): 100 mg via ORAL
  Filled 2021-12-23 (×4): qty 1

## 2021-12-23 MED ORDER — POLYVINYL ALCOHOL 1.4 % OP SOLN: 1.0000 [drp] | OPHTHALMIC | Status: DC | PRN

## 2021-12-23 MED ORDER — ONDANSETRON HCL 4 MG PO TABS
4.0000 mg | ORAL_TABLET | Freq: Four times a day (QID) | ORAL | Status: DC | PRN
  Administered 2021-12-25: 4 mg via ORAL
  Filled 2021-12-23: qty 1

## 2021-12-23 MED ORDER — FENOFIBRATE 54 MG PO TABS
54.0000 mg | ORAL_TABLET | Freq: Every day | ORAL | Status: DC
  Administered 2021-12-24 – 2021-12-26 (×3): 54 mg via ORAL
  Filled 2021-12-23 (×3): qty 1

## 2021-12-23 MED ORDER — ONDANSETRON HCL 4 MG/2ML IJ SOLN
4.0000 mg | Freq: Four times a day (QID) | INTRAMUSCULAR | Status: DC | PRN
  Administered 2021-12-23: 4 mg via INTRAVENOUS
  Filled 2021-12-23: qty 2

## 2021-12-23 MED ORDER — METOPROLOL TARTRATE 25 MG PO TABS
50.0000 mg | ORAL_TABLET | Freq: Once | ORAL | Status: AC
  Administered 2021-12-23: 50 mg via ORAL
  Filled 2021-12-23: qty 2

## 2021-12-23 MED ORDER — AMIODARONE HCL 200 MG PO TABS
200.0000 mg | ORAL_TABLET | Freq: Every day | ORAL | Status: DC
  Administered 2021-12-24 – 2021-12-27 (×4): 200 mg via ORAL
  Filled 2021-12-23 (×4): qty 1

## 2021-12-23 MED ORDER — PENTOSAN POLYSULFATE SODIUM 100 MG PO CAPS
100.0000 mg | ORAL_CAPSULE | Freq: Two times a day (BID) | ORAL | Status: DC
  Administered 2021-12-24 (×2): 100 mg via ORAL
  Filled 2021-12-23 (×4): qty 1

## 2021-12-23 MED ORDER — HYOSCYAMINE SULFATE 0.125 MG SL SUBL
0.1250 mg | SUBLINGUAL_TABLET | SUBLINGUAL | Status: DC | PRN
Start: 2021-12-23 — End: 2021-12-27

## 2021-12-23 MED ORDER — POLYETHYLENE GLYCOL 3350 17 G PO PACK: 17.0000 g | PACK | Freq: Every day | ORAL | Status: DC | PRN

## 2021-12-23 MED ORDER — BISACODYL 5 MG PO TBEC: 5.0000 mg | DELAYED_RELEASE_TABLET | Freq: Every day | ORAL | Status: DC | PRN

## 2021-12-23 MED ORDER — SODIUM CHLORIDE 0.9% FLUSH
3.0000 mL | Freq: Two times a day (BID) | INTRAVENOUS | Status: DC
  Administered 2021-12-23 – 2021-12-27 (×7): 3 mL via INTRAVENOUS

## 2021-12-23 MED ORDER — OXYCODONE HCL 5 MG PO TABS
5.0000 mg | ORAL_TABLET | ORAL | Status: DC | PRN
  Filled 2021-12-23: qty 1

## 2021-12-23 NOTE — ED Provider Notes (Signed)
Spoke to Dr. Marlowe Sax regarding patient's admission. She will admit the patient for further evaluation and treatment    Lyndel Safe 12/23/21 0053    Quintella Reichert, MD 12/23/21 [phone]

## 2021-12-23 NOTE — Assessment & Plan Note (Signed)
-  Continue atorvastatin, fenofibrate

## 2021-12-23 NOTE — Progress Notes (Signed)
Arrived from [place] via [place], [place] notified. [address] MX40-28 VS per protocol. CHG bath given

## 2021-12-23 NOTE — Assessment & Plan Note (Addendum)
-  02/2021 echo with EF 40-45% -She appears to be compensated at this time -Hold Lasix for now

## 2021-12-23 NOTE — ED Notes (Signed)
Pt reports heaviness left chest pain . EDP notified. Repeat EKG obtained and given to provider

## 2021-12-23 NOTE — Assessment & Plan Note (Addendum)
-  Patient with left-sided chest pressure that was previously intermittent but has been persistent since starting last night -Non-exertional -Improved transiently with NTG but minimally -Symptoms suggestive of noncardiac chest pain, particularly given reproducibility.  -No skin lesions suggestive of shingles. -CXR unremarkable.   -Initial cardiac HS troponin negative and repeated for a total of 6 completely normal tests.  -EKG not indicative of acute ischemia.   -Will observe on telemetry to rule out ACS  -Cardiology consultation requested -She had a prior negative cath in 07/2020 so will defer to cardiology about whether repeat evaluation is needed at this time.

## 2021-12-23 NOTE — Assessment & Plan Note (Signed)
-  s/p successful cardioversion -She is also on 3 rate-controlling medications - amiodarone, Toprol XL, and diltiazem (all continued for now) -Continue Eliquis

## 2021-12-23 NOTE — Assessment & Plan Note (Signed)
-  She reports diarrhea (1-3 loose stools) several times a week -She was seen by GI recently and had a CT with ?colitis -She appears to need colonoscopy for direct visualization as an outpatient -Continue Librax, Nexium (protonix per formulary), hyoscyamine

## 2021-12-23 NOTE — ED Notes (Signed)
Report given to [facility] 

## 2021-12-23 NOTE — ED Notes (Signed)
ED TO INPATIENT HANDOFF REPORT  ED Nurse Name and Phone #:  Angelina Pih RN  S Name/Age/Gender Amy Hickman 86 y.o. female Room/Bed: MH07/MH07  Code Status   Code Status: Prior  Home/SNF/Other Home Patient oriented to: self, place, time, and situation Is this baseline? Yes   Triage Complete: Triage complete  Chief Complaint Chest pain [R07.9]  Triage Note Pt reporting CP that began at 4pm today. Reports that it feels heavy. Took a nap and pain went away; it began to come back again. Denies N/V/D. Hx of afib and cardioversion; last had one in June.    Allergies Allergies  Allergen Reactions   Ativan [Lorazepam] Other (See Comments)    "Knocked her out, woke up 3 days later"   Augmentin [Amoxicillin-Pot Clavulanate] Nausea And Vomiting   Avelox [Moxifloxacin] Itching and Nausea Only   Chlorzoxazone Other (See Comments)    Went crazy/loopy   Ciprofloxacin Hcl Itching and Nausea Only   Crestor [Rosuvastatin] Other (See Comments)    myalgia   Gluten Meal Other (See Comments)    Diarrhea, hot/cold sweat, will sleep for a couple of hours - Celiac disease   Nitrofuran Derivatives Itching and Nausea Only   Phenergan [Promethazine Hcl] Other (See Comments)    hallucinations   Codeine Palpitations   Darvon [Propoxyphene Hcl] Palpitations   Doxycycline Itching and Nausea Only    Level of Care/Admitting Diagnosis ED Disposition     ED Disposition  Admit   Condition  --   Amy: Hickman [[id number]]  Level of Care: Telemetry Cardiac [[id number]]  Interfacility transfer: Yes  May place patient in observation at [facility] or [place] if equivalent level of care is available:: Yes  Covid Evaluation: Asymptomatic - no recent exposure (last 10 days) testing not required  Diagnosis: Chest pain [[id number]]  Admitting Physician: Shela Leff [[id number]]  Attending Physician: Quintella Reichert [[id number]]          B Medical/Surgery  History Past Medical History:  Diagnosis Date   Celiac disease    GERD (gastroesophageal reflux disease)    HOH (hard of hearing)    Hypertension    Interstitial cystitis    Past Surgical History:  Procedure Laterality Date   ABDOMINAL HYSTERECTOMY     APPENDECTOMY     CARDIOVERSION N/A 04/24/2021   Procedure: CARDIOVERSION;  Surgeon: Jerline Pain, MD;  Location: [place];  Service: Cardiovascular;  Laterality: N/A;   CARDIOVERSION N/A 06/12/2021   Procedure: CARDIOVERSION;  Surgeon: Buford Dresser, MD;  Location: [place];  Service: Cardiovascular;  Laterality: N/A;   CARDIOVERSION N/A 08/11/2021   Procedure: CARDIOVERSION;  Surgeon: [name], MD;  Location: [facility] ENDOSCOPY;  Service: Cardiovascular;  Laterality: N/A;   CHOLECYSTECTOMY     ear drum surgery     REPLACEMENT TOTAL KNEE     ROTATOR CUFF REPAIR Right    TONSILLECTOMY       A IV Location/Drains/Wounds Patient Lines/Drains/Airways Status     Active Line/Drains/Airways     Name Placement date Placement time Site Days   Peripheral IV 12/22/21 20 G Anterior;Right Forearm 12/22/21  2333  Forearm  1            Intake/Output Last 24 hours  Intake/Output Summary (Last 24 hours) at 12/23/2021 1131 Last data filed at 12/23/2021 0148 Gross per 24 hour  Intake 1000 ml  Output --  Net 1000 ml    Labs/Imaging Results for orders placed or performed  during the hospital encounter of 12/22/21 (from the past 48 hour(s))  Basic metabolic panel     Status: Abnormal   Collection Time: 12/22/21 10:08 PM  Result Value Ref Range   Sodium 139 135 - 145 mmol/L   Potassium 4.3 3.5 - 5.1 mmol/L   Chloride 106 98 - 111 mmol/L   CO2 25 22 - 32 mmol/L   Glucose, Bld 116 (H) 70 - 99 mg/dL    Comment: Glucose reference range applies only to samples taken after fasting for at least 8 hours.   BUN 36 (H) 8 - 23 mg/dL   Creatinine, Ser 1.76 (H) 0.44 - 1.00 mg/dL   Calcium 9.5 8.9 - 10.3 mg/dL   GFR,  Estimated 28 (L) >60 mL/min    Comment: (NOTE) Calculated using the CKD-EPI Creatinine Equation (2021)    Anion gap 8 5 - 15    Comment: Performed at [facility], [address] [place], [place] [address]  CBC     Status: None   Collection Time: 12/22/21 10:08 PM  Result Value Ref Range   WBC 5.9 4.0 - 10.5 K/uL   RBC 3.99 3.87 - 5.11 MIL/uL   Hemoglobin 12.3 12.0 - 15.0 g/dL   HCT 37.7 36.0 - 46.0 %   MCV 94.5 80.0 - 100.0 fL   MCH 30.8 26.0 - 34.0 pg   MCHC 32.6 30.0 - 36.0 g/dL   RDW 15.1 11.5 - 15.5 %   Platelets 226 150 - 400 K/uL   nRBC 0.0 0.0 - 0.2 %    Comment: Performed at [facility], [address] [place], [place] [address]  Troponin I (High Sensitivity)     Status: None   Collection Time: 12/22/21 10:08 PM  Result Value Ref Range   Troponin I (High Sensitivity) 7 <18 ng/L    Comment: (NOTE) Elevated high sensitivity troponin I (hsTnI) values and significant  changes across serial measurements may suggest ACS but many other  chronic and acute conditions are known to elevate hsTnI results.  Refer to the "Links" section for chest pain algorithms and additional  guidance. Performed at [facility], [address] [place], [place] [address]   Troponin I (High Sensitivity)     Status: None   Collection Time: 12/22/21 11:39 PM  Result Value Ref Range   Troponin I (High Sensitivity) 7 <18 ng/L    Comment: (NOTE) Elevated high sensitivity troponin I (hsTnI) values and significant  changes across serial measurements may suggest ACS but many other  chronic and acute conditions are known to elevate hsTnI results.  Refer to the "Links" section for chest pain algorithms and additional  guidance. Performed at [facility], [address] [place], [place] [address]   Troponin I (High Sensitivity)     Status: None   Collection Time: 12/23/21  1:49 AM  Result Value Ref Range   Troponin I (High Sensitivity) 9 <18 ng/L     Comment: (NOTE) Elevated high sensitivity troponin I (hsTnI) values and significant  changes across serial measurements may suggest ACS but many other  chronic and acute conditions are known to elevate hsTnI results.  Refer to the "Links" section for chest pain algorithms and additional  guidance. Performed at [facility], [address] [place], [place] [address]   Troponin I (High Sensitivity)     Status: None   Collection Time: 12/23/21  5:42 AM  Result Value Ref Range   Troponin I (High Sensitivity) 9 <18 ng/L    Comment: (NOTE) Elevated high sensitivity troponin I (hsTnI) values and significant  changes across serial measurements may suggest ACS but many other  chronic and acute conditions are known to elevate hsTnI results.  Refer to the "Links" section for chest pain algorithms and additional  guidance. Performed at [facility], [address] [place], [place] [address]   Troponin I (High Sensitivity)     Status: None   Collection Time: 12/23/21  8:21 AM  Result Value Ref Range   Troponin I (High Sensitivity) 8 <18 ng/L    Comment: (NOTE) Elevated high sensitivity troponin I (hsTnI) values and significant  changes across serial measurements may suggest ACS but many other  chronic and acute conditions are known to elevate hsTnI results.  Refer to the "Links" section for chest pain algorithms and additional  guidance. Performed at [facility], [address] [place], [place] [address]    DG Chest 2 View  Result Date: 12/22/2021 CLINICAL DATA:  Chest pain. EXAM: CHEST - 2 VIEW COMPARISON:  Radiograph 03/18/2021 FINDINGS: The cardiomediastinal contours are normal. The lungs are clear. Pulmonary vasculature is normal. No consolidation, pleural effusion, or pneumothorax. Bilateral shoulder arthroplasties. No acute osseous abnormalities are seen. Surgical clips in the upper abdomen. IVC filter is partially visualized. IMPRESSION: No acute  chest findings. Electronically Signed   By: Keith Rake M.D.   On: 12/22/2021 22:31    Pending Labs Unresulted Labs (From admission, onward)    None       Vitals/Pain Today's Vitals   12/23/21 0930 12/23/21 1004 12/23/21 1030 12/23/21 1106  BP: (!) 153/69  (!) 159/61   Pulse: (!) 55  (!) 53   Resp: 17  20   Temp: 97.6 F (36.4 C)  97.7 F (36.5 C)   TempSrc: Oral     SpO2: 98%  99%   Weight:      Height:      PainSc:  3   3     Isolation Precautions No active isolations  Medications Medications  nitroGLYCERIN (NITROSTAT) SL tablet 0.4 mg (0.4 mg Sublingual Given 12/23/21 0807)  aspirin chewable tablet 324 mg (324 mg Oral Given 12/22/21 2319)  ondansetron (ZOFRAN) injection 4 mg (4 mg Intravenous Given 12/22/21 2346)  sodium chloride 0.9 % bolus 1,000 mL (0 mLs Intravenous Stopped 12/23/21 0148)  fentaNYL (SUBLIMAZE) injection 25 mcg (25 mcg Intravenous Given 12/23/21 0822)  ondansetron (ZOFRAN) injection 4 mg (4 mg Intravenous Given 12/23/21 0912)    Mobility walks Low fall risk   Focused Assessments Cardiac Assessment Handoff:  Cardiac Rhythm: Sinus bradycardia, Other (Comment) (Heart murmur noted) Lab Results  Component Value Date   CKTOTAL 72 03/15/2021   TROPONINI <0.03 10/22/2014   Lab Results  Component Value Date   DDIMER 0.54 (H) 10/22/2014   Does the Patient currently have chest pain? Yes  Pt has had 6 troponins and 2 normal ekgs.   R Recommendations: See Admitting Provider Note  Report given to:   Additional Notes:

## 2021-12-23 NOTE — Assessment & Plan Note (Signed)
-  Continue Toprol XL -Hold Losartan in the setting of stage 4 CKD (stable so could consider cautiously restarting) -She has been taking both amlodipine and diltiazem - will hold the amlodipine for now

## 2021-12-23 NOTE — Assessment & Plan Note (Signed)
-  Continue Elm[place]

## 2021-12-23 NOTE — Consult Note (Addendum)
Cardiology Consultation   Patient ID: VIANNA VENEZIA MRN: [id number]; DOB: [birth year]  Admit date: 12/22/2021 Date of Consult: 12/23/2021  PCP:  [facility] [place] [facility] Cardiologist:  Buford Dresser, MD     Patient Profile:   Amy Hickman is a 86 y.o. female with a hx of persistent atrial fibrillation s/p multiple cardioversions, hypertension, OSA on CPAP, CKD, GERD, COPD and celiac disease who is being seen 12/23/2021 for the evaluation of chest pain at the request of Dr. Lorin Mercy.  History of Present Illness:   Amy Hickman is an 86 year old female with past medical history noted above.  She has been followed by Dr. Harrell Gave as an outpatient as well as in the A-fib clinic.  Prior to this she underwent cardiac catheterization 07/2020 through [facility] with borderline CAD involving the LAD and first diagonal of 60% stenosis, 40% proximal/mid RCA in 20% second OM which was managed medically.    She was initially seen by heart care 02/2021 for sepsis in the setting of flu and found to have A-fib RVR.  Echocardiogram that admission showed LVEF of 40 to 45%, global hypokinesis, mildly reduced RV, moderately elevated pulmonary artery pressure, mildly dilated left atrium and severely dilated right atrium, mild to moderate MR, moderate aortic stenosis with concern for low-flow low gradient moderate AS.  She was noted to be difficult to rate control and required both metoprolol and diltiazem.  Underwent outpatient cardioversion on 04/2021 to sinus rhythm but did not maintain.   She was seen in the office on 05/2021 with Dr. Harrell Gave and overall reported feeling well.  She had recently had a mechanical fall with injury to her face and had to hold her apixaban for 2 days after her recent cardioversion.  She was continued on metoprolol as well as diltiazem as she was not well rate controlled on metoprolol alone. Continue on apixaban 5  mg twice daily, and it was recommended that she undergo a repeat cardioversion.  She was loaded with amiodarone. Cardioversion on 05/2021 was successful.  She was referred to the A-fib clinic for recurrent atrial fibrillation.  Amiodarone was increased to 200 mg twice daily and she underwent repeat cardioversion to sinus bradycardia.  Last seen in the A-fib clinic on 08/2021 and noted to be maintaining sinus rhythm.  She was continued on amiodarone 200 mg daily, diltiazem 360 mg daily, metoprolol XL 100 mg daily and Eliquis 5 mg twice daily.  Presented to [facility] on 9/4 with complaints of chest pain that started around 4 PM that day.  States that she was in her usual state of health other than being fatigued on Sunday.  Was watching a movie the day of admission and fell asleep.  When she woke up she had left-sided sharp chest pain which was persistent and ultimately presented to the ED for further evaluation.  Labs in the ED showed sodium 139, potassium 4.3, creatinine 1.7, high-sensitivity troponin 7>>7, WBC 5.9, hemoglobin 12.3.  EKG shows sinus rhythm with known T wave inversion in septal leads.  Given her ongoing chest discomfort she was transferred to [facility] for further evaluation.  Cardiology now asked to evaluate.  In talking with patient she currently lives at home with her granddaughter.  She is somewhat limited in her physical activity but is able to complete her ADLs independently.  She does walk to the mailbox on a regular basis which is a long distance and  down/uphill.  She does experience some dyspnea and chest tightness with this activity from time to time.   At the time of exam she is still complaining of 8/10 sharp chest pain which is worse with inspiration and palpation.   Does report she was treated for pericarditis several years ago at [facility] and states her symptoms feel quite similar to this.  Past Medical History:  Diagnosis Date   Atrial fibrillation,  chronic (HCC)    s/p multiple cardioversions with sustained NSR, on Eliquis   Celiac disease    Chronic systolic CHF (congestive heart failure) (HCC)    GERD (gastroesophageal reflux disease)    HOH (hard of hearing)    Hypertension    Interstitial cystitis     Past Surgical History:  Procedure Laterality Date   ABDOMINAL HYSTERECTOMY     APPENDECTOMY     CARDIOVERSION N/A 04/24/2021   Procedure: CARDIOVERSION;  Surgeon: Jerline Pain, MD;  Location: [place];  Service: Cardiovascular;  Laterality: N/A;   CARDIOVERSION N/A 06/12/2021   Procedure: CARDIOVERSION;  Surgeon: Buford Dresser, MD;  Location: [place];  Service: Cardiovascular;  Laterality: N/A;   CARDIOVERSION N/A 08/11/2021   Procedure: CARDIOVERSION;  Surgeon: [name], MD;  Location: [facility] ENDOSCOPY;  Service: Cardiovascular;  Laterality: N/A;   CHOLECYSTECTOMY     ear drum surgery     REPLACEMENT TOTAL KNEE     ROTATOR CUFF REPAIR Right    TONSILLECTOMY       Home Medications:  Prior to Admission medications   Medication Sig Start Date End Date Taking? Authorizing Provider  acetaminophen (TYLENOL) 500 MG tablet Take 1 tablet (500 mg total) by mouth every 6 (six) hours as needed for pain. Patient taking differently: Take 1,000 mg by mouth every 6 (six) hours as needed for pain or moderate pain. 06/24/12  Yes [name], MD  amiodarone (PACERONE) 200 MG tablet Take 1 tablet (200 mg total) by mouth daily. 10/24/21  Yes Fenton, Clint R, PA  amLODipine (NORVASC) 2.5 MG tablet Take 2.5 mg by mouth daily. 08/19/21  Yes [provider]  apixaban (ELIQUIS) 2.5 MG TABS tablet Take 1 tablet (2.5 mg total) by mouth 2 (two) times daily. 09/16/21  Yes Buford Dresser, MD  atorvastatin (LIPITOR) 10 MG tablet Take 1 tablet (10 mg total) by mouth daily. Patient taking differently: Take 10 mg by mouth every evening. 03/21/21 03/21/22 Yes Georgette Shell, MD  B Complex Vitamins (VITAMIN B  COMPLEX) TABS Take 1 tablet by mouth daily.   Yes [provider]  clidinium-chlordiazePOXIDE (LIBRAX) 5-2.5 MG capsule Take 2 capsules by mouth daily as needed (cramping). 02/28/21  Yes [provider]  Coenzyme Q10 (COQ10) 100 MG CAPS Take 100 mg by mouth every evening.   Yes [provider]  CRANBERRY-VITAMIN C PO Take 1 capsule by mouth in the morning.   Yes [provider]  diltiazem (CARDIZEM CD) 360 MG 24 hr capsule Take 1 capsule (360 mg total) by mouth daily. 06/12/21  Yes Buford Dresser, MD  esomeprazole (NEXIUM) 40 MG capsule Take 40 mg by mouth daily before breakfast.   Yes [provider]  fenofibrate 54 MG tablet Take 54 mg by mouth daily.   Yes [provider]  furosemide (LASIX) 40 MG tablet Take 1 tablet (40 mg total) by mouth daily. 04/08/21  Yes Buford Dresser, MD  Hyoscyamine Sulfate SL 0.125 MG SUBL Place 0.125 mg under the tongue every 4 (four) hours as needed (  cramping). 04/24/21  Yes [provider]  levothyroxine (SYNTHROID) 75 MCG tablet Take 75 mcg by mouth daily before breakfast.   Yes [provider]  losartan (COZAAR) 25 MG tablet Take 1 tablet (25 mg total) by mouth daily. 07/28/21 12/23/21 Yes Sherran Needs, NP  metoprolol (TOPROL-XL) 100 MG 24 hr tablet Take 1 tablet (100 mg total) by mouth daily. Take with or immediately following a meal. Patient taking differently: Take 100 mg by mouth every evening. 08/11/21 08/06/22 Yes Sherran Needs, NP  Netarsudil-Latanoprost 0.02-0.005 % SOLN Place 1 drop into both eyes daily.   Yes [provider]  pentosan polysulfate (ELMIRON) 100 MG capsule Take 100 mg by mouth 2 (two) times daily.   Yes [provider]  pilocarpine (PILOCAR) 1 % ophthalmic solution Place 1 drop into the left eye 2 (two) times daily. 02/13/21  Yes [provider]  Turmeric 500 MG CAPS Take 500 mg by mouth every evening.   Yes [provider]  Vitamin D, Ergocalciferol, (DRISDOL) 1.25 MG (50000 UNIT) CAPS capsule Take 50,000 Units by mouth every 7 (seven) days. 05/22/21  Yes [provider]    Inpatient Medications: Scheduled Meds:  amiodarone  200 mg Oral Daily   apixaban  2.5 mg Oral BID   atorvastatin  10 mg Oral QPM   diltiazem  360 mg Oral Daily   docusate sodium  100 mg Oral BID   fenofibrate  54 mg Oral Daily   [START ON 12/24/2021] levothyroxine  75 mcg Oral QAC breakfast   metoprolol succinate  100 mg Oral QPM   Netarsudil-Latanoprost  1 drop Both Eyes Daily   [START ON 12/24/2021] pantoprazole  40 mg Oral Daily   pentosan polysulfate  100 mg Oral BID   pilocarpine  1 drop Left Eye BID   sodium chloride flush  3 mL Intravenous Q12H   Continuous Infusions:  PRN Meds: acetaminophen **OR** acetaminophen, albuterol, bisacodyl, clidinium-chlordiazePOXIDE, hydrALAZINE, Hyoscyamine Sulfate SL, ondansetron **OR** ondansetron (ZOFRAN) IV, oxyCODONE, polyethylene glycol  Allergies:    Allergies  Allergen Reactions   Ativan [Lorazepam] Other (See Comments)    "Knocked her out, woke up 3 days later"   Augmentin [Amoxicillin-Pot Clavulanate] Nausea And Vomiting   Avelox [Moxifloxacin] Itching and Nausea Only   Chlorzoxazone Other (See Comments)    Went crazy/loopy   Ciprofloxacin Hcl Itching and Nausea Only   Crestor [Rosuvastatin] Other (See Comments)    myalgia   Gluten Meal Other (See Comments)    Diarrhea, hot/cold sweat, will sleep for a couple of hours - Celiac disease   Nitrofuran Derivatives Itching and Nausea Only   Phenergan [Promethazine Hcl] Other (See Comments)    hallucinations   Codeine Palpitations   Darvon [Propoxyphene Hcl] Palpitations   Doxycycline Itching and Nausea Only    Social History:   Social History   Socioeconomic History   Marital status: Widowed    Spouse name: Not on file   Number of children: Not on file   Years of education: Not on file   Highest  education level: Not on file  Occupational History   Occupation: retired  Tobacco Use   Smoking status: Former    Packs/day: 2.00    Years: 33.00    Total pack years: 66.00    Types: Cigarettes    Quit date: 1981    Years since quitting: 42.7   Smokeless tobacco: Never  Substance and Sexual Activity   Alcohol use: Not Currently  Comment: rare   Drug use: No   Sexual activity: Not on file  Other Topics Concern   Not on file  Social History Narrative   Not on file   Social Determinants of Health   Financial Resource Strain: Not on file  Food Insecurity: Not on file  Transportation Needs: Not on file  Physical Activity: Not on file  Stress: Not on file  Social Connections: Not on file  Intimate Partner Violence: Not on file    Family History:    Family History  Problem Relation Age of Onset   Hypertension Other      ROS:  Please see the history of present illness.   All other ROS reviewed and negative.     Physical Exam/Data:   Vitals:   12/23/21 1130 12/23/21 1145 12/23/21 1200 12/23/21 1359  BP: (!) 142/71  (!) 180/76 (!) 194/93  Pulse: (!) 55 (!) 56  (!) 58  Resp: 17 19    Temp: 97.8 F (36.6 C) 97.8 F (36.6 C)    TempSrc:      SpO2: 94% 95%  96%  Weight:      Height:        Intake/Output Summary (Last 24 hours) at 12/23/2021 1529 Last data filed at 12/23/2021 0148 Gross per 24 hour  Intake 1000 ml  Output --  Net 1000 ml      12/22/2021    9:52 PM 08/21/2021    9:12 AM 08/11/2021   10:32 AM  Last 3 Weights  Weight (lbs) 141 lb 140 lb 6.4 oz 149 lb 11.1 oz  Weight (kg) 63.957 kg 63.685 kg 67.9 kg     Body mass index is 27.54 kg/m.  General:  Well nourished, well developed, in no acute distress HEENT: normal Neck: no JVD Vascular: No carotid bruits; Distal pulses 2+ bilaterally Cardiac:  normal S1, S2; RRR; 3/6 systolic murmur  Lungs:  clear to auscultation bilaterally, no wheezing, rhonchi or rales  Abd: soft, nontender, no hepatomegaly   Ext: no edema Musculoskeletal:  No deformities, BUE and BLE strength normal and equal Skin: warm and dry  Neuro:  CNs 2-12 intact, no focal abnormalities noted Psych:  Normal affect   EKG:  The EKG was personally reviewed and demonstrates: sinus rhythm with known T wave inversion in septal leads  Telemetry:  Telemetry was personally reviewed and demonstrates: Sinus rhythm  Relevant CV Studies:  Echo: 02/2021  IMPRESSIONS     1. Left ventricular ejection fraction, by estimation, is 40 to 45%. The  left ventricle has mildly decreased function. The left ventricle  demonstrates global hypokinesis. Left ventricular diastolic parameters are  indeterminate.   2. Right ventricular systolic function is mildly reduced. The right  ventricular size is normal. There is moderately elevated pulmonary artery  systolic pressure. The estimated right ventricular systolic pressure is  83.0 mmHg.   3. Left atrial size was moderately dilated.   4. Right atrial size was severely dilated.   5. The mitral valve is normal in structure. Mild to moderate mitral valve  regurgitation.   6. Tricuspid valve regurgitation is moderate.   7. The aortic valve is tricuspid. There is severe calcifcation of the  aortic valve. Aortic valve regurgitation is mild. Moderate aortic valve  stenosis. Gradients consistent with mild AS (MG 55mHg, Vmax 2.2 m/s), but  moderate by AVA (1.0cm^2) and DI  (0.32). Low SV index, suspect low flow low gradient moderate AS   8. The inferior vena cava is  dilated in size with <50% respiratory  variability, suggesting right atrial pressure of 15 mmHg.   FINDINGS   Left Ventricle: Left ventricular ejection fraction, by estimation, is 40  to 45%. The left ventricle has mildly decreased function. The left  ventricle demonstrates global hypokinesis. The left ventricular internal  cavity size was normal in size. There is   no left ventricular hypertrophy. Left ventricular diastolic  parameters  are indeterminate.   Right Ventricle: The right ventricular size is normal. Right vetricular  wall thickness was not well visualized. Right ventricular systolic  function is mildly reduced. There is moderately elevated pulmonary artery  systolic pressure. The tricuspid  regurgitant velocity is 2.82 m/s, and with an assumed right atrial  pressure of 15 mmHg, the estimated right ventricular systolic pressure is  67.3 mmHg.   Left Atrium: Left atrial size was moderately dilated.   Right Atrium: Right atrial size was severely dilated.   Pericardium: There is no evidence of pericardial effusion.   Mitral Valve: The mitral valve is normal in structure. Mild to moderate  mitral valve regurgitation.   Tricuspid Valve: The tricuspid valve is normal in structure. Tricuspid  valve regurgitation is moderate.   Aortic Valve: The aortic valve is tricuspid. There is severe calcifcation  of the aortic valve. Aortic valve regurgitation is mild. Moderate aortic  stenosis is present. Aortic valve mean gradient measures 11.0 mmHg. Aortic  valve peak gradient measures  19.2 mmHg. Aortic valve area, by VTI measures 0.99 cm.   Pulmonic Valve: The pulmonic valve was not well visualized. Pulmonic valve  regurgitation is not visualized.   Aorta: The aortic root is normal in size and structure.   Venous: The inferior vena cava is dilated in size with less than 50%  respiratory variability, suggesting right atrial pressure of 15 mmHg.   IAS/Shunts: The interatrial septum was not well visualized.    Cath: ([facility]) 07/2020  PROCEDURE:  Coronary angiography, Left heart cath, LV gram, Right heart  cath and PCI   DIAGNOSTIC FINDINGS:    1.  Mild aortic stenosis.  Mean gradient 8 mmHg.  2.  Borderline coronary artery stenosis involving the LAD and first  diagonal.  The IFR was measured in both the LAD and the diagonal  separately.  The IFR was unremarkable, consistent with no impairment of   coronary blood flow to the segments.  Therefore, coronary artery  intervention was not currently indicated.  3.  Mild pulmonary hypertension, accompanied by mildly elevated right  atrial and left atrial filling pressures.  This is likely manifestation of  salt/volume overload.  4.  Normal cardiac output.   Note: A contrast minimizing approach was used for this procedure given her  renal insufficiency.   Laboratory Data:  High Sensitivity Troponin:   Recent Labs  Lab 12/22/21 2339 12/23/21 0149 12/23/21 0542 12/23/21 0821 12/23/21 1057  TROPONINIHS 7 9 9 8 7      Chemistry Recent Labs  Lab 12/22/21 2208  NA 139  K 4.3  CL 106  CO2 25  GLUCOSE 116*  BUN 36*  CREATININE 1.76*  CALCIUM 9.5  GFRNONAA 28*  ANIONGAP 8    No results for input(s): "PROT", "ALBUMIN", "AST", "ALT", "ALKPHOS", "BILITOT" in the last 168 hours. Lipids No results for input(s): "CHOL", "TRIG", "HDL", "LABVLDL", "LDLCALC", "CHOLHDL" in the last 168 hours.  Hematology Recent Labs  Lab 12/22/21 2208  WBC 5.9  RBC 3.99  HGB 12.3  HCT 37.7  MCV 94.5  MCH 30.8  MCHC 32.6  RDW 15.1  PLT 226   Thyroid No results for input(s): "TSH", "FREET4" in the last 168 hours.  BNPNo results for input(s): "BNP", "PROBNP" in the last 168 hours.  DDimer No results for input(s): "DDIMER" in the last 168 hours.   Radiology/Studies:  DG Chest 2 View  Result Date: 12/22/2021 CLINICAL DATA:  Chest pain. EXAM: CHEST - 2 VIEW COMPARISON:  Radiograph 03/18/2021 FINDINGS: The cardiomediastinal contours are normal. The lungs are clear. Pulmonary vasculature is normal. No consolidation, pleural effusion, or pneumothorax. Bilateral shoulder arthroplasties. No acute osseous abnormalities are seen. Surgical clips in the upper abdomen. IVC filter is partially visualized. IMPRESSION: No acute chest findings. Electronically Signed   By: Keith Rake M.D.   On: 12/22/2021 22:31     Assessment and Plan:   LESTINE RAHE  is a 86 y.o. female with a hx of persistent atrial fibrillation s/p multiple cardioversions, hypertension, OSA on CPAP, CKD, GERD, COPD and celiac disease who is being seen 12/23/2021 for the evaluation of chest pain at the request of Dr. Lorin Mercy.  Chest pain -- Symptoms are somewhat atypical in nature though do have somewhat of an exertional component as well.  High-sensitivity troponin cycled and negative x6.  EKG without acute ischemic changes.  Does complain of increased chest pain with inspiration and palpation over the left anterior chest.  Question whether inflammatory? 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 -- Baseline appears 1.2, recently 1.6 and up to 1.76 on admission with a GFR of 28 -- PTA losartan 25 mg daily held on admission along with Lasix -- Follow BMET  Hyperlipidemia -- hx of statin intolerance -- On Lipitor 10 mg daily, fenofibrate 54 mg daily  Hypertension -- Blood pressures mostly elevated though has not received usual home medications while boarding in the ED -- Continue diltiazem 360 mg daily, Toprol XL 100 mg daily -- Losartan held  Mild to moderate MR Low-flow low gradient moderate AS -- Noted on echocardiogram 02/2021 -- Prominent murmur on exam -- Check Echo   Risk Assessment/Risk Scores:   CHA2DS2-VASc Score = 5   This indicates a 7.2% annual risk of stroke. The patient's score is based upon: CHF History: 0 HTN History: 1 Diabetes History: 0 Stroke History: 0 Vascular Disease History: 1 Age Score:  2 Gender Score: 1   For questions or updates, please contact [facility] Please consult [email or web address] for contact info under    Signed, Reino Bellis, NP  12/23/2021 3:29 PM  I have examined the patient and reviewed assessment and plan and discussed with patient.  Agree with above as stated.    Atypical chest pain.  Multiple negative troponins.  Echocardiogram shows moderate aortic stenosis in the past.    Will add hydralazine for BP.  Unclear why her BP is so elevated.  Losartan on hold due to elevated Cr.  Checking labs to look for pericarditis.  Echo pending as well.  Cath in the past showed moderate CAD.  No plans for ischemic w/u as her chest pain is more musculoskeletal  and she has had multiple negative troponins.   Amy Hickman

## 2021-12-23 NOTE — Progress Notes (Signed)
OVERNIGHT CROSS COVER NOTE:   Called around 22:30 regarding patient HR range 60-50 bpm and had not yet received metoprolol 100 mg (ordered for 2200) and diltiazem CD 360 (ordered for 1900). Current HR is 54 bpm, will hold both. Discussed with RN that if HR goes above 80 will dose medication then.    Libby Maw, MD MS

## 2021-12-23 NOTE — Assessment & Plan Note (Signed)
Continue Synthroid °

## 2021-12-23 NOTE — Plan of Care (Addendum)
I was contacted via page regarding Ms. Amy Hickman at a [employer].  I am remote and cannot personally evaluate the patient.  Provider reports she has been having left sided chest pressure and pain with radiation over the last day.  ECG is reviewed and similar to prior with non-specific ST changes.  Initial hsTroponin is below threshold.  Given concern for anginal pain with risk factors, chest pain observation with non-invasive risk stratification is reasonable.  Complete troponin rule out and cycle EKG for recurrent chest pain.  Exclude non-cardiac differential up to and including dissection at the ED physician's discretion.  Delton Prairie, MD Cardiology Fellow [facility]

## 2021-12-23 NOTE — Progress Notes (Signed)
Mobility Specialist Progress Note:   12/23/21 1530  Mobility  Activity Ambulated with assistance in room;Ambulated with assistance to bathroom  Level of Assistance Modified independent, requires aide device or extra time  Assistive Device None  Distance Ambulated (ft) 30 ft  Activity Response Tolerated well  $Mobility charge 1 Mobility   Pt received in bed asking to go to the bathroom. No complaints of pain. Left in bed with call bell in reach and all needs met.   Amy Hickman Mobility Specialist  

## 2021-12-23 NOTE — Evaluation (Addendum)
Physical Therapy Evaluation & Discharge Patient Details Name: Amy Hickman MRN: [id number] DOB: Aug 27, 1935 Today's Date: 12/23/2021  History of Present Illness  Pt is an 86 y.o. female admitted 12/23/21 with L-side chest pain. Troponin and ECG negative; suspect noncardiac etiology. PMH includes afib (s/p multiple cardioversions), CHF, HTN, Celiac disease, R TKA, R rotator cuff repair, HOH.   Clinical Impression  Patient evaluated by Physical Therapy with no further acute PT needs identified. PTA, pt independent, lives with granddaughter, does not drive but stays very active with church's senior group. Today, pt moving well without assist; endorses SOB and fatigue with ambulation (see below for vitals). Pt and daughter express concern that pt had similar symptoms "the last time she went into afib and needed cardioversion." Pt also reports symptoms similar to past episode of pericarditis. All education has been completed and the patient has no further questions. Acute PT is signing off. Thank you for this referral.    HR 58-74 with ambulation - questionable arrhythmia with mobility that resolved with rest (RN notified) Post-ambulation BP 180/86   Recommendations for follow up therapy are one component of a multi-disciplinary discharge planning process, led by the attending physician.  Recommendations may be updated based on patient status, additional functional criteria and insurance authorization.  Follow Up Recommendations No PT follow up      Assistance Recommended at Discharge PRN  Patient can return home with the following  Assistance with cooking/housework;Assist for transportation    Equipment Recommendations None recommended by PT  Recommendations for Other Services      Functional Status Assessment       Precautions / Restrictions Precautions Precautions: Fall Restrictions Weight Bearing Restrictions: No      Mobility  Bed Mobility Overal bed mobility: Independent                   Transfers Overall transfer level: Independent Equipment used: None                    Ambulation/Gait Ambulation/Gait assistance: Supervision Gait Distance (Feet): 220 Feet Assistive device: None Gait Pattern/deviations: Step-through pattern, Decreased stride length Gait velocity: Decreased     General Gait Details: slow, steady gait without DME, supervision for safety and HR monitoring; pt endorses fatigue and SOB; no overt instability or LOB  Stairs            Wheelchair Mobility    Modified Rankin (Stroke Patients Only)       Balance Overall balance assessment: No apparent balance deficits (not formally assessed)                                           Pertinent Vitals/Pain Pain Assessment Pain Assessment: Faces Faces Pain Scale: Hurts little more Pain Location: chest ("I think it's because of the fluid around my heart... this has happened before") Pain Descriptors / Indicators: Discomfort Pain Intervention(s): Monitored during session    Home Living Family/patient expects to be discharged to:: Private residence Living Arrangements: Other relatives (granddaughter) Available Help at Discharge: Family;Available PRN/intermittently Type of Home: House Home Access: Level entry     Alternate Level Stairs-Number of Steps: Chair lift between floors which pt uses consistently Home Layout: Multi-level Home Equipment: Rollator (4 wheels);Rolling Walker (2 wheels);Electric scooter      Prior Function Prior Level of Function : Independent/Modified Independent  Mobility Comments: independent without DME; denies falls in the past few months. does not drive due to macular degeneration. very active with senior group at [place]        Extremity/Trunk Assessment   Upper Extremity Assessment Upper Extremity Assessment: Overall WFL for tasks assessed    Lower Extremity  Assessment Lower Extremity Assessment: Overall WFL for tasks assessed    Cervical / Trunk Assessment Cervical / Trunk Assessment: Normal  Communication   Communication: HOH  Cognition Arousal/Alertness: Awake/alert Behavior During Therapy: WFL for tasks assessed/performed Overall Cognitive Status: Within Functional Limits for tasks assessed                                          General Comments General comments (skin integrity, edema, etc.): HR 58-74 - questioned afib during ambulation, but irregular rhythm resolved with rest (RN notified); SpO2 96% on RA; post-ambulation BP 180/86. pt's daughter Amy Hickman) present and supportive    Exercises     Assessment/Plan    PT Assessment Patient does not need any further PT services  PT Problem List         PT Treatment Interventions      PT Goals (Current goals can be found in the Care Plan section)  Acute Rehab PT Goals PT Goal Formulation: All assessment and education complete, DC therapy    Frequency       Co-evaluation               AM-PAC PT "6 Clicks" Mobility  Outcome Measure Help needed turning from your back to your side while in a flat bed without using bedrails?: None Help needed moving from lying on your back to sitting on the side of a flat bed without using bedrails?: None Help needed moving to and from a bed to a chair (including a wheelchair)?: None Help needed standing up from a chair using your arms (e.g., wheelchair or bedside chair)?: None Help needed to walk in hospital room?: A Little Help needed climbing 3-5 steps with a railing? : A Little 6 Click Score: 22    End of Session Equipment Utilized During Treatment: Gait belt Activity Tolerance: Patient tolerated treatment well Patient left: in bed;with call bell/phone within reach;with bed alarm set;with family/visitor present Nurse Communication: Mobility status PT Visit Diagnosis: Other abnormalities of gait and mobility  (R26.89)    Time: 0488-8916 PT Time Calculation (min) (ACUTE ONLY): 20 min   Charges:   PT Evaluation $PT Eval Low Complexity: Perkins, PT, DPT Acute Rehabilitation Services  Personal: [facility] Office: [phone]  Derry Lory 12/23/2021, 5:02 PM

## 2021-12-23 NOTE — Progress Notes (Addendum)
Patient due for cardizem and metoprolol but sustains heart rate in high 50s. On-call hospitalist notified. Received order to notify cardiology.  Per on call cardiologist, page if/when heart rate sustains in 80s to determine appropriate intervention at that time.  Addendum: criteria never met. Medications held accordingly.

## 2021-12-23 NOTE — Assessment & Plan Note (Addendum)
-  Appears to be stable at this time -Attempt to avoid nephrotoxic medications -Will hold losartan -Recheck BMP in AM

## 2021-12-23 NOTE — ED Notes (Signed)
Cindee Mclester [phone]- call for any updates

## 2021-12-23 NOTE — Assessment & Plan Note (Signed)
-  I have discussed code status with the patient and her daughter and granddaughter and  they are in agreement that the patient would not desire resuscitation and would prefer to die a natural death should that situation arise. -She will need a gold out of facility DNR form at the time of discharge

## 2021-12-23 NOTE — ED Notes (Signed)
Assisted pt to the bathroom. Even and steady gait.

## 2021-12-23 NOTE — H&P (Addendum)
History and Physical    Patient: Amy Hickman DOB: 07-03-1935 DOA: 12/22/2021 DOS: the patient was seen and examined on 12/23/2021 PCP: [facility]  Patient coming from: Home - lives with granddaughter; NOK: [place]el[place], [place], [place]   Chief Complaint: CP  HPI: Amy Hickman is a 86 y.o. female with medical history significant of afib s/p successful cardioversion on Eliquis; HTN; CAD; and chronic systolic CHF presenting with CP.  She reports that she has left chest pain.  It feels like someone sat on her chest or like she ran a race and needs to breathe hard and it really hurts.  The pain has been off and on for a long time, but worse in the last week.  Her neck and shoulder were hurting and she was concerned about this.  She has had "inflammation around my heart a lot."  She went into afib last Thanksgiving and get very sick after a flu shot.  Dr. Harrell Gave is her cardiologist and she has been to the afib clinic.  She had cardioversion x 4, with success in May.  Previously she noticed it more at night.  She just recently started on CPAP.  Yesterday the pain started about 4pm and she was taking a nap.  Nothing made it worse or better.  She kept making herself cough because someone told her it might make it better.  She has had 6 NTG with only transient improvement and then she got hot and nauseated.  The pain is still present.  Her last stress test was before COVID.  She had a cath in 07/2020 with mild AS, borderline CAD, mild pulmonary HTN.      ER Course:  [facility] to [facility] transfer, per Dr. Marlowe Sax:  86 year old female with history of A-fib on Eliquis, hypertension, CAD, CHF presented with left-sided chest pain radiating to the neck and associated with dyspnea and palpitations.  Troponin negative x2.  Chest x-ray negative.  Creatinine 1.7, baseline 1.2.  Cardiology recommended admission for further work-up, not had a stress test for several  years.  Vital signs stable and PE felt to be less likely.     Review of Systems: As mentioned in the history of present illness. All other systems reviewed and are negative. Past Medical History:  Diagnosis Date   Atrial fibrillation, chronic (HCC)    s/p multiple cardioversions with sustained NSR, on Eliquis   Celiac disease    Chronic systolic CHF (congestive heart failure) (HCC)    GERD (gastroesophageal reflux disease)    HOH (hard of hearing)    Hypertension    Interstitial cystitis    Past Surgical History:  Procedure Laterality Date   ABDOMINAL HYSTERECTOMY     APPENDECTOMY     CARDIOVERSION N/A 04/24/2021   Procedure: CARDIOVERSION;  Surgeon: Jerline Pain, MD;  Location: [place];  Service: Cardiovascular;  Laterality: N/A;   CARDIOVERSION N/A 06/12/2021   Procedure: CARDIOVERSION;  Surgeon: Buford Dresser, MD;  Location: [place];  Service: Cardiovascular;  Laterality: N/A;   CARDIOVERSION N/A 08/11/2021   Procedure: CARDIOVERSION;  Surgeon: [name], MD;  Location: [facility] ENDOSCOPY;  Service: Cardiovascular;  Laterality: N/A;   CHOLECYSTECTOMY     ear drum surgery     REPLACEMENT TOTAL KNEE     ROTATOR CUFF REPAIR Right    TONSILLECTOMY     Social History:  reports that she quit smoking about 42 years ago. Her smoking use included cigarettes. She  has a 66.00 pack-year smoking history. She has never used smokeless tobacco. She reports that she does not currently use alcohol. She reports that she does not use drugs.  Allergies  Allergen Reactions   Ativan [Lorazepam] Other (See Comments)    "Knocked her out, woke up 3 days later"   Augmentin [Amoxicillin-Pot Clavulanate] Nausea And Vomiting   Avelox [Moxifloxacin] Itching and Nausea Only   Chlorzoxazone Other (See Comments)    Went crazy/loopy   Ciprofloxacin Hcl Itching and Nausea Only   Crestor [Rosuvastatin] Other (See Comments)    myalgia   Gluten Meal Other (See Comments)    Diarrhea,  hot/cold sweat, will sleep for a couple of hours - Celiac disease   Nitrofuran Derivatives Itching and Nausea Only   Phenergan [Promethazine Hcl] Other (See Comments)    hallucinations   Codeine Palpitations   Darvon [Propoxyphene Hcl] Palpitations   Doxycycline Itching and Nausea Only    Family History  Problem Relation Age of Onset   Hypertension Other     Prior to Admission medications   Medication Sig Start Date End Date Taking? Authorizing Provider  acetaminophen (TYLENOL) 500 MG tablet Take 1 tablet (500 mg total) by mouth every 6 (six) hours as needed for pain. Patient taking differently: Take 1,000 mg by mouth as needed for pain. 06/24/12   [name], MD  amiodarone (PACERONE) 200 MG tablet Take 1 tablet (200 mg total) by mouth daily. 10/24/21   Fenton, Clint R, PA  apixaban (ELIQUIS) 2.5 MG TABS tablet Take 1 tablet (2.5 mg total) by mouth 2 (two) times daily. 09/16/21   Buford Dresser, MD  atorvastatin (LIPITOR) 10 MG tablet Take 1 tablet (10 mg total) by mouth daily. Patient taking differently: Take 10 mg by mouth every evening. 03/21/21 03/21/22  Georgette Shell, MD  B Complex Vitamins (VITAMIN B COMPLEX) TABS Take 1 tablet by mouth daily.    [provider]  clidinium-chlordiazePOXIDE (LIBRAX) 5-2.5 MG capsule Take 2 capsules by mouth daily as needed (cramping). 02/28/21   [provider]  Coenzyme Q10 (COQ10) 100 MG CAPS Take 100 mg by mouth every evening.    [provider]  CRANBERRY-VITAMIN C PO Take 1 capsule by mouth in the morning.    [provider]  diltiazem (CARDIZEM CD) 360 MG 24 hr capsule Take 1 capsule (360 mg total) by mouth daily. 06/12/21   Buford Dresser, MD  doxazosin (CARDURA) 4 MG tablet Take 4 mg by mouth daily.    [provider]  esomeprazole (NEXIUM) 40 MG capsule Take 40 mg by mouth daily before breakfast.    [provider]  fenofibrate 54 MG tablet Take 54 mg by mouth  daily.    [provider]  furosemide (LASIX) 40 MG tablet Take 1 tablet (40 mg total) by mouth daily. 04/08/21   Buford Dresser, MD  Hyoscyamine Sulfate SL 0.125 MG SUBL Place 0.125 mg under the tongue every 4 (four) hours as needed (cramping). 04/24/21   [provider]  latanoprost (XALATAN) 0.005 % ophthalmic solution Place 1 drop into both eyes at bedtime. 03/11/21   [provider]  levothyroxine (SYNTHROID) 75 MCG tablet Take 75 mcg by mouth daily before breakfast.    [provider]  losartan (COZAAR) 25 MG tablet Take 1 tablet (25 mg total) by mouth daily. 07/28/21 10/26/21  Sherran Needs, NP  metoprolol (TOPROL-XL) 100 MG 24 hr tablet Take 1 tablet (100 mg total) by mouth daily. Take with  or immediately following a meal. 08/11/21 08/06/22  Sherran Needs, NP  pentosan polysulfate (ELMIRON) 100 MG capsule Take 100 mg by mouth 2 (two) times daily.    [provider]  pilocarpine (PILOCAR) 1 % ophthalmic solution Place 1 drop into the left eye 2 (two) times daily. 02/13/21   [provider]  Turmeric 500 MG CAPS Take 500 mg by mouth every evening.    [provider]  Vitamin D, Ergocalciferol, (DRISDOL) 1.25 MG (50000 UNIT) CAPS capsule Take 50,000 Units by mouth once a week. Saturday 05/22/21   [provider]    Physical Exam: Vitals:   12/23/21 1130 12/23/21 1145 12/23/21 1200 12/23/21 1359  BP: (!) 142/71  (!) 180/76 (!) 194/93  Pulse: (!) 55 (!) 56  (!) 58  Resp: 17 19    Temp: 97.8 F (36.6 C) 97.8 F (36.6 C)    TempSrc:      SpO2: 94% 95%  96%  Weight:      Height:       General:  Appears calm and comfortable and is in NAD, appears fatigued Eyes:   EOMI, normal lids, iris ENT:  grossly normal hearing, lips & tongue, mmm; artificial dentition Neck:  no LAD, masses or thyromegaly Cardiovascular:  RRR, no r/g, 0-3/5 systolic murmur. No LE edema. CP is reproducible to palpation. Respiratory:   CTA  bilaterally with no wheezes/rales/rhonchi.  Normal respiratory effort. Abdomen:  soft, NT, ND Skin:  no rash or induration seen on limited exam Musculoskeletal:  grossly normal tone BUE/BLE, good ROM, no bony abnormality Psychiatric:  blunted mood and affect, speech fluent and appropriate, AOx3 Neurologic:  CN 2-12 grossly intact, moves all extremities in coordinated fashion   Radiological Exams on Admission: Independently reviewed - see discussion in A/P where applicable  DG Chest 2 View  Result Date: 12/22/2021 CLINICAL DATA:  Chest pain. EXAM: CHEST - 2 VIEW COMPARISON:  Radiograph 03/18/2021 FINDINGS: The cardiomediastinal contours are normal. The lungs are clear. Pulmonary vasculature is normal. No consolidation, pleural effusion, or pneumothorax. Bilateral shoulder arthroplasties. No acute osseous abnormalities are seen. Surgical clips in the upper abdomen. IVC filter is partially visualized. IMPRESSION: No acute chest findings. Electronically Signed   By: Keith Rake M.D.   On: 12/22/2021 22:31    EKG: Independently reviewed.  NSR with rate 58; nonspecific ST changes with no evidence of acute ischemia   Labs on Admission: I have personally reviewed the available labs and imaging studies at the time of the admission.  Pertinent labs:    Glucose 116 BUN 36/Creatinine 1.76/GFR 28 - stable HS troponin 7, 7, 9, 9, 8, 7 Normal CBC   Assessment and Plan: Principal Problem:   Chest pain Active Problems:   Atrial fibrillation, chronic (HCC)   Chronic systolic CHF (congestive heart failure) (HCC)   Celiac disease   CKD (chronic kidney disease) stage 4, GFR 15-29 ml/min (HCC)   Hypothyroidism   Essential hypertension   Hyperlipidemia   DNR (do not resuscitate)   Interstitial cystitis    Assessment and Plan: * Chest pain -Patient with left-sided chest pressure that was previously intermittent but has been persistent since starting last night -Non-exertional -Improved  transiently with NTG but minimally -Symptoms suggestive of noncardiac chest pain, particularly given reproducibility.  -No skin lesions suggestive of shingles. -CXR unremarkable.   -Initial cardiac HS troponin negative and repeated for a total of 6 completely normal tests.  -EKG not indicative of acute ischemia.   -Will  observe on telemetry to rule out ACS  -Cardiology consultation requested -She had a prior negative cath in 07/2020 so will defer to cardiology about whether repeat evaluation is needed at this time.  Atrial fibrillation, chronic (HCC) -s/p successful cardioversion -She is also on 3 rate-controlling medications - amiodarone, Toprol XL, and diltiazem (all continued for now) -Continue Eliquis  Chronic systolic CHF (congestive heart failure) ([place]) -02/2021 echo with EF 40-45% -She appears to be compensated at this time -Hold Lasix for now  Celiac disease -She reports diarrhea (1-3 loose stools) several times a week -She was seen by GI recently and had a CT with ?colitis -She appears to need colonoscopy for direct visualization as an outpatient -Continue Librax, Nexium (protonix per formulary), hyoscyamine  CKD (chronic kidney disease) stage 4, GFR 15-29 ml/min (HCC) -Appears to be stable at this time -Attempt to avoid nephrotoxic medications -Will hold losartan -Recheck BMP in AM  Interstitial cystitis -Continue Elmiron  DNR (do not resuscitate) -I have discussed code status with the patient and her daughter and granddaughter and  they are in agreement that the patient would not desire resuscitation and would prefer to die a natural death should that situation arise. -She will need a gold out of facility DNR form at the time of discharge  Hyperlipidemia -Continue atorvastatin, fenofibrate  Essential hypertension -Continue Toprol XL -Hold Losartan in the setting of stage 4 CKD (stable so could consider cautiously restarting) -She has been taking both amlodipine  and diltiazem - will hold the amlodipine for now  Hypothyroidism -Continue Synthroid      Advance Care Planning:   Code Status: DNR   Consults: Cardiology; PT/OT; nutrition; TOC team  DVT Prophylaxis: Eliquis  Family Communication: Daughter and granddaughter were present throughout evaluation  Severity of Illness: It is my clinical opinion that referral for OBSERVATION is reasonable and necessary in this patient based on the above information provided. The aforementioned taken together are felt to place the patient at high risk for further clinical deterioration. However it is anticipated that the patient may be medically stable for discharge from the hospital within 24 to 48 hours.   Author: Karmen Bongo, MD 12/23/2021 3:11 PM  For on call review [email or web address].

## 2021-12-23 NOTE — ED Notes (Signed)
Pt has bilateral hearing aids in place

## 2021-12-23 NOTE — Plan of Care (Signed)
[facility] will assume care on arrival to accepting facility. Until arrival, care as per EDP. However, [facility] available 24/7 for questions and assistance.  Nursing staff, please page [facility] Admits and Consults ([phone]) as soon as the patient arrives to the hospital.   

## 2021-12-24 ENCOUNTER — Observation Stay (HOSPITAL_BASED_OUTPATIENT_CLINIC_OR_DEPARTMENT_OTHER): Payer: Medicare Other

## 2021-12-24 DIAGNOSIS — R079 Chest pain, unspecified: Secondary | ICD-10-CM | POA: Diagnosis not present

## 2021-12-24 DIAGNOSIS — E440 Moderate protein-calorie malnutrition: Secondary | ICD-10-CM | POA: Insufficient documentation

## 2021-12-24 DIAGNOSIS — I482 Chronic atrial fibrillation, unspecified: Secondary | ICD-10-CM | POA: Diagnosis not present

## 2021-12-24 DIAGNOSIS — R072 Precordial pain: Secondary | ICD-10-CM | POA: Diagnosis not present

## 2021-12-24 DIAGNOSIS — R071 Chest pain on breathing: Secondary | ICD-10-CM | POA: Diagnosis not present

## 2021-12-24 DIAGNOSIS — N184 Chronic kidney disease, stage 4 (severe): Secondary | ICD-10-CM | POA: Diagnosis not present

## 2021-12-24 DIAGNOSIS — R0789 Other chest pain: Secondary | ICD-10-CM | POA: Diagnosis not present

## 2021-12-24 DIAGNOSIS — I10 Essential (primary) hypertension: Secondary | ICD-10-CM | POA: Diagnosis not present

## 2021-12-24 LAB — BASIC METABOLIC PANEL
Anion gap: 9 (ref 5–15)
BUN: 25 mg/dL — ABNORMAL HIGH (ref 8–23)
CO2: 26 mmol/L (ref 22–32)
Calcium: 9.7 mg/dL (ref 8.9–10.3)
Chloride: 103 mmol/L (ref 98–111)
Creatinine, Ser: 1.5 mg/dL — ABNORMAL HIGH (ref 0.44–1.00)
GFR, Estimated: 34 mL/min — ABNORMAL LOW (ref >60)
Glucose, Bld: 92 mg/dL (ref 70–99)
Potassium: 3.9 mmol/L (ref 3.5–5.1)
Sodium: 138 mmol/L (ref 135–145)

## 2021-12-24 LAB — ECHOCARDIOGRAM COMPLETE
AR max vel: 0.98 cm2
AV Area VTI: 1.09 cm2
AV Area mean vel: 0.97 cm2
AV Mean grad: 11 mmHg
AV Peak grad: 16.3 mmHg
Ao pk vel: 2.02 m/s
Calc EF: 48.2 %
Height: 60 in
S' Lateral: 3.8 cm
Single Plane A2C EF: 51 %
Single Plane A4C EF: 48 %
Weight: 2227.2 oz

## 2021-12-24 LAB — CBC
HCT: 39.1 % (ref 36.0–46.0)
Hemoglobin: 12.6 g/dL (ref 12.0–15.0)
MCH: 30.4 pg (ref 26.0–34.0)
MCHC: 32.2 g/dL (ref 30.0–36.0)
MCV: 94.2 fL (ref 80.0–100.0)
Platelets: 222 10*3/uL (ref 150–400)
RBC: 4.15 MIL/uL (ref 3.87–5.11)
RDW: 14.9 % (ref 11.5–15.5)
WBC: 6 10*3/uL (ref 4.0–10.5)
nRBC: 0 % (ref 0.0–0.2)

## 2021-12-24 LAB — HIGH SENSITIVITY CRP: CRP, High Sensitivity: 1.12 mg/L (ref 0.00–3.00)

## 2021-12-24 MED ORDER — ADULT MULTIVITAMIN W/MINERALS CH
1.0000 | ORAL_TABLET | Freq: Every day | ORAL | Status: DC
Start: 2021-12-24 — End: 2021-12-27
  Administered 2021-12-24 – 2021-12-27 (×4): 1 via ORAL
  Filled 2021-12-24 (×4): qty 1

## 2021-12-24 MED ORDER — APIXABAN 5 MG PO TABS
5.0000 mg | ORAL_TABLET | Freq: Two times a day (BID) | ORAL | Status: DC
  Administered 2021-12-24 – 2021-12-26 (×4): 5 mg via ORAL
  Filled 2021-12-24 (×4): qty 1

## 2021-12-24 MED ORDER — COLCHICINE 0.3 MG HALF TABLET
0.3000 mg | ORAL_TABLET | ORAL | Status: DC
  Filled 2021-12-24: qty 1

## 2021-12-24 MED ORDER — AMLODIPINE BESYLATE 2.5 MG PO TABS
2.5000 mg | ORAL_TABLET | Freq: Every day | ORAL | Status: DC
  Administered 2021-12-24: 2.5 mg via ORAL
  Filled 2021-12-24: qty 1

## 2021-12-24 MED ORDER — LIDOCAINE 5 % EX PTCH
1.0000 | MEDICATED_PATCH | CUTANEOUS | Status: DC
  Administered 2021-12-24 – 2021-12-27 (×4): 1 via TRANSDERMAL
  Filled 2021-12-24 (×4): qty 1

## 2021-12-24 MED ORDER — ACETAMINOPHEN 500 MG PO TABS
500.0000 mg | ORAL_TABLET | Freq: Three times a day (TID) | ORAL | Status: DC
  Administered 2021-12-24 – 2021-12-27 (×9): 500 mg via ORAL
  Filled 2021-12-24 (×9): qty 1

## 2021-12-24 MED ORDER — HYDRALAZINE HCL 25 MG PO TABS
25.0000 mg | ORAL_TABLET | Freq: Two times a day (BID) | ORAL | Status: DC
  Administered 2021-12-24 – 2021-12-25 (×2): 25 mg via ORAL
  Filled 2021-12-24 (×2): qty 1

## 2021-12-24 MED ORDER — ACETAMINOPHEN 500 MG PO TABS: 500.0000 mg | ORAL_TABLET | Freq: Three times a day (TID) | ORAL | Status: DC

## 2021-12-24 MED ORDER — ENSURE ENLIVE PO LIQD
237.0000 mL | Freq: Two times a day (BID) | ORAL | Status: DC
  Administered 2021-12-24 – 2021-12-26 (×2): 237 mL via ORAL

## 2021-12-24 MED ORDER — FUROSEMIDE 40 MG PO TABS
40.0000 mg | ORAL_TABLET | Freq: Every day | ORAL | Status: DC
  Administered 2021-12-24: 40 mg via ORAL
  Filled 2021-12-24: qty 1

## 2021-12-24 MED ORDER — PANTOPRAZOLE SODIUM 40 MG PO TBEC
40.0000 mg | DELAYED_RELEASE_TABLET | Freq: Two times a day (BID) | ORAL | Status: DC
  Administered 2021-12-24 – 2021-12-27 (×6): 40 mg via ORAL
  Filled 2021-12-24 (×6): qty 1

## 2021-12-24 NOTE — Progress Notes (Signed)
PROGRESS NOTE    Amy Hickman  RKY:[id number] DOB: 09/21/1935 DOA: 12/22/2021 PCP: [facility]   Brief Narrative: 86 year old with past medical history significant for A-fib status post successful cardioversion on Eliquis, hypertension, CAD, chronic systolic heart failure presents with chest pain.  The chest pain has been on and off for a long time but getting worse.  She has had for cardioversion last 1 May. Present with chest pain, cardiology has been consulted.  Troponin  multiple times negative.   Assessment & Plan:   Principal Problem:   Chest pain Active Problems:   Atrial fibrillation, chronic (HCC)   Chronic systolic CHF (congestive heart failure) (HCC)   Celiac disease   CKD (chronic kidney disease) stage 4, GFR 15-29 ml/min (HCC)   Hypothyroidism   Essential hypertension   Hyperlipidemia   DNR (do not resuscitate)   Interstitial cystitis   1-Chest pain: Atypical, reports chest pain after she had 2D echo worse with pressure from 2D echo probe. Scheduled Tylenol, lidocaine patch. He is not able to tolerate tramadol or any opioids. I would not want to use NSAIDs due to CKD Will defer prednisone to cardiology Echo stable ejection fraction Waiting cardiology follow-up Started on colchicine  Chronic A-fib: Continue with Toprol and diltiazem Continue with amiodarone  Chronic Systolic heart failure Continue with Lasix  Celiac disease: She was seen by GI recently and had a CT with ?colitis -She appears to need colonoscopy for direct visualization as an outpatient -Continue Librax, Nexium (protonix per formulary), hyoscyamine.   CKD stage IV Avoid nephrotoxins.  Continue to hold losartan  Interstitial cystitis Continue with Elmiron  Hypertension: Continue with Toprol.  Holding losartan due to CKD.  Resume amlodipine and diltiazem./     Estimated body mass index is 27.19 kg/m as calculated from the following:   Height  as of this encounter: 5' (1.524 m).   Weight as of this encounter: 63.1 kg.   DVT prophylaxis: Eliquis Code Status: DNR Family Communication: care discussed with family Disposition Plan:  Status is: Observation The patient remains OBS appropriate and will d/c before 2 midnights.    Consultants:  Cardiology   Procedures:  ECHO  Antimicrobials:    Subjective: She is still having chest pain. Worse with palpation. After she had ECHO got worse due to pressure from probe. She also report holding her chest help./   Objective: Vitals:   12/23/21 1600 12/23/21 2000 12/24/21 0552 12/24/21 0722  BP: (!) 180/86 (!) 156/72 (!) 141/71 (!) 158/80  Pulse:  60 (!) 57 67  Resp:   18 17  Temp:   97.9 F (36.6 C) 97.9 F (36.6 C)  TempSrc:   Oral Oral  SpO2:  95% 93% 94%  Weight:   63.1 kg   Height:       No intake or output data in the 24 hours ending 12/24/21 0758 Filed Weights   12/22/21 2152 12/24/21 0552  Weight: 64 kg 63.1 kg    Examination:  General exam: Appears calm and comfortable  Respiratory system: Clear to auscultation. Respiratory effort normal. Cardiovascular system: S1 & S2 heard, RRR.  Gastrointestinal system: Abdomen is nondistended, soft and nontender. No organomegaly or masses felt. Normal bowel sounds heard. Central nervous system: Alert and oriented. No focal neurological deficits. Extremities: Symmetric 5 x 5 power.   Data Reviewed: I have personally reviewed following labs and imaging studies  CBC: Recent Labs  Lab 12/22/21 2208  WBC 5.9  HGB  12.3  HCT 37.7  MCV 94.5  PLT 631   Basic Metabolic Panel: Recent Labs  Lab 12/22/21 2208  NA 139  K 4.3  CL 106  CO2 25  GLUCOSE 116*  BUN 36*  CREATININE 1.76*  CALCIUM 9.5   GFR: Estimated Creatinine Clearance: 20.1 mL/min (A) (by C-G formula based on SCr of 1.76 mg/dL (H)). Liver Function Tests: No results for input(s): "AST", "ALT", "ALKPHOS", "BILITOT", "PROT", "ALBUMIN" in the last  168 hours. No results for input(s): "LIPASE", "AMYLASE" in the last 168 hours. No results for input(s): "AMMONIA" in the last 168 hours. Coagulation Profile: No results for input(s): "INR", "PROTIME" in the last 168 hours. Cardiac Enzymes: No results for input(s): "CKTOTAL", "CKMB", "CKMBINDEX", "TROPONINI" in the last 168 hours. BNP (last 3 results) No results for input(s): "PROBNP" in the last 8760 hours. HbA1C: No results for input(s): "HGBA1C" in the last 72 hours. CBG: No results for input(s): "GLUCAP" in the last 168 hours. Lipid Profile: No results for input(s): "CHOL", "HDL", "LDLCALC", "TRIG", "CHOLHDL", "LDLDIRECT" in the last 72 hours. Thyroid Function Tests: No results for input(s): "TSH", "T4TOTAL", "FREET4", "T3FREE", "THYROIDAB" in the last 72 hours. Anemia Panel: No results for input(s): "VITAMINB12", "FOLATE", "FERRITIN", "TIBC", "IRON", "RETICCTPCT" in the last 72 hours. Sepsis Labs: No results for input(s): "PROCALCITON", "LATICACIDVEN" in the last 168 hours.  No results found for this or any previous visit (from the past 240 hour(s)).       Radiology Studies: DG Chest 2 View  Result Date: 12/22/2021 CLINICAL DATA:  Chest pain. EXAM: CHEST - 2 VIEW COMPARISON:  Radiograph 03/18/2021 FINDINGS: The cardiomediastinal contours are normal. The lungs are clear. Pulmonary vasculature is normal. No consolidation, pleural effusion, or pneumothorax. Bilateral shoulder arthroplasties. No acute osseous abnormalities are seen. Surgical clips in the upper abdomen. IVC filter is partially visualized. IMPRESSION: No acute chest findings. Electronically Signed   By: Keith Rake M.D.   On: 12/22/2021 22:31        Scheduled Meds:  amiodarone  200 mg Oral Daily   apixaban  2.5 mg Oral BID   atorvastatin  10 mg Oral QPM   diltiazem  360 mg Oral Daily   docusate sodium  100 mg Oral BID   fenofibrate  54 mg Oral Daily   hydrALAZINE  10 mg Intravenous Once   levothyroxine   75 mcg Oral QAC breakfast   metoprolol succinate  100 mg Oral QPM   pantoprazole  40 mg Oral Daily   pentosan polysulfate  100 mg Oral BID WC   pilocarpine  1 drop Left Eye BID   sodium chloride flush  3 mL Intravenous Q12H   Continuous Infusions:   LOS: 0 days    Time spent: 35 minutes    Klint Lezcano A Elwin Tsou, MD [facility]   If 7PM-7AM, please contact night-coverage [email or web address]  12/24/2021, 7:58 AM

## 2021-12-24 NOTE — Progress Notes (Signed)
Initial Nutrition Assessment  DOCUMENTATION CODES:   Non-severe (moderate) malnutrition in context of chronic illness  INTERVENTION:  Gluten free diet Ensure Enlive po BID, each supplement provides 350 kcal and 20 grams of protein. (Strawberry or vanilla) MVI with minerals daily Snacks BID  NUTRITION DIAGNOSIS:   Moderate Malnutrition related to chronic illness (CHF, afib, celiac's disease) as evidenced by percent weight loss, moderate muscle depletion.  GOAL:   Patient will meet greater than or equal to 90% of their needs  MONITOR:   PO intake, Supplement acceptance, Labs, Weight trends  REASON FOR ASSESSMENT:   Consult Other (Comment) (nutrition goals)  ASSESSMENT:   Pt admitted from home with chest pain. PMH significant for afib s/p cardioversion, HTN, CAD, and chronic systolic CHF, CKD, GERD, COPD and celiac disease.  Pt sitting in bed with family present at bedside. She states that she is feeling worn out from the ECHO this morning. She endorses a fair appetite. Since she moved in with family she has increased her meal frequency from once daily to 2-3 times daily. She loves salads and cottage cheese. She states that she has been experiencing stomach difficulties d/t colitis and diverticulitis. She goes between diarrhea and constipation.   She had shingles in her throat ~3-4 years ago. D/t this she states that she seldom feels as though food gets stuck in her throat but washes down with a warm beverage such as coffee.   Meal completions: 9/6: 75%-breakfast, 50% lunch  She reports that she has experienced weight loss although did not quantify. She weighs herself daily and states that more recently her weight has remained stable around 141 lbs. When she is constipated it is slightly elevated.  Reviewed weight history. It appears that since 03/15 she has had a 10.5% weight loss which is clinically significant for time frame.   Medications:  colace, fenofibrate, synthroid,  protonix  Labs: BUN 25, Cr 1.50, GFR 34  NUTRITION - FOCUSED PHYSICAL EXAM:  Flowsheet Row Most Recent Value  Orbital Region Mild depletion  Upper Arm Region No depletion  Thoracic and Lumbar Region No depletion  Buccal Region No depletion  Temple Region No depletion  Clavicle Bone Region Mild depletion  Clavicle and Acromion Bone Region Moderate depletion  Scapular Bone Region Moderate depletion  Dorsal Hand Mild depletion  Patellar Region Moderate depletion  Anterior Thigh Region Moderate depletion  Posterior Calf Region Mild depletion  Edema (RD Assessment) None  Hair Reviewed  Eyes Reviewed  Mouth Reviewed  Skin Reviewed  Nails Reviewed       Diet Order:   Diet Order             Diet gluten free Room service appropriate? Yes; Fluid consistency: Thin  Diet effective now                   EDUCATION NEEDS:   Education needs have been addressed  Skin:  Skin Assessment: Reviewed RN Assessment  Last BM:  PTA  Height:   Ht Readings from Last 1 Encounters:  12/22/21 5' (1.524 m)    Weight:   Wt Readings from Last 1 Encounters:  12/24/21 63.1 kg   BMI:  Body mass index is 27.19 kg/m.  Estimated Nutritional Needs:   Kcal:  1400-1600  Protein:  75-90g  Fluid:  >/=1.5L  Clayborne Dana, RDN, LDN Clinical Nutrition

## 2021-12-24 NOTE — Evaluation (Signed)
Occupational Therapy Evaluation Patient Details Name: Amy Hickman MRN: [id number] DOB: 03/21/1936 Today's Date: 12/24/2021   History of Present Illness Pt is an 86 y.o. female admitted 12/23/21 with L-side chest pain. Troponin and ECG negative; suspect noncardiac etiology. PMH includes afib (s/p multiple cardioversions), CHF, HTN, Celiac disease, R TKA, R rotator cuff repair, HOH.   Clinical Impression   Patient admitted for the diagnosis above.  Patient lives at her home with her granddaughter, who provides community mobility assist, but otherwise the patient is independent with all mobility, ADL and iADL.  Patient is very close to her baseline, and no further OT needs exist in the acute setting.  No post acute OT is anticipated.        Recommendations for follow up therapy are one component of a multi-disciplinary discharge planning process, led by the attending physician.  Recommendations may be updated based on patient status, additional functional criteria and insurance authorization.   Follow Up Recommendations  No OT follow up    Assistance Recommended at Discharge PRN  Patient can return home with the following Assist for transportation    Functional Status Assessment  Patient has not had a recent decline in their functional status  Equipment Recommendations  None recommended by OT    Recommendations for Other Services       Precautions / Restrictions Precautions Precautions: Fall Restrictions Weight Bearing Restrictions: No      Mobility Bed Mobility Overal bed mobility: Independent                  Transfers Overall transfer level: Independent Equipment used: None                      Balance Overall balance assessment: Mild deficits observed, not formally tested                                         ADL either performed or assessed with clinical judgement   ADL Overall ADL's : At baseline                                              Vision Baseline Vision/History: 1 Wears glasses Patient Visual Report: No change from baseline       Perception Perception Perception: Within Functional Limits   Praxis Praxis Praxis: Intact    Pertinent Vitals/Pain Pain Assessment Pain Assessment: No/denies pain Pain Intervention(s): Monitored during session     Hand Dominance Right   Extremity/Trunk Assessment Upper Extremity Assessment Upper Extremity Assessment: Defer to OT evaluation   Lower Extremity Assessment Lower Extremity Assessment: Defer to PT evaluation   Cervical / Trunk Assessment Cervical / Trunk Assessment: Normal   Communication Communication Communication: HOH   Cognition Arousal/Alertness: Awake/alert Behavior During Therapy: WFL for tasks assessed/performed Overall Cognitive Status: Within Functional Limits for tasks assessed                                       General Comments   VSS    Exercises     Shoulder Instructions      Home Living Family/patient expects to be discharged to:: Private residence Living Arrangements: Other relatives  Available Help at Discharge: Family;Available PRN/intermittently Type of Home: House Home Access: Level entry     Home Layout: Multi-level Alternate Level Stairs-Number of Steps: Chair lift between floors which pt uses consistently   Bathroom Shower/Tub: Occupational psychologist: Standard Bathroom Accessibility: Yes How Accessible: Accessible via walker Home Equipment: Rollator (4 wheels);Rolling Walker (2 wheels);Electric scooter          Prior Functioning/Environment Prior Level of Function : Independent/Modified Independent                        OT Problem List: Impaired balance (sitting and/or standing)      OT Treatment/Interventions:      OT Goals(Current goals can be found in the care plan section) Acute Rehab OT Goals Patient Stated Goal: Return home OT  Goal Formulation: With patient Time For Goal Achievement: 12/29/21 Potential to Achieve Goals: Good  OT Frequency:      Co-evaluation              AM-PAC OT "6 Clicks" Daily Activity     Outcome Measure Help from another person eating meals?: None Help from another person taking care of personal grooming?: None Help from another person toileting, which includes using toliet, bedpan, or urinal?: None Help from another person bathing (including washing, rinsing, drying)?: None Help from another person to put on and taking off regular upper body clothing?: None Help from another person to put on and taking off regular lower body clothing?: None 6 Click Score: 24   End of Session Nurse Communication: Mobility status  Activity Tolerance: Patient tolerated treatment well Patient left: in bed;with call bell/phone within reach;with family/visitor present  OT Visit Diagnosis: Unsteadiness on feet (R26.81)                Time: 7972-8206 OT Time Calculation (min): 20 min Charges:  OT General Charges $OT Visit: 1 Visit OT Evaluation $OT Eval Moderate Complexity: 1 Mod  12/24/2021  RP, OTR/L  Acute Rehabilitation Services  Office:  [phone]   Metta Clines 12/24/2021, 4:28 PM

## 2021-12-24 NOTE — Progress Notes (Addendum)
Rounding Note    Patient Name: Amy Hickman Date of Encounter: 12/24/2021  [place] Cardiologist: Buford Dresser, MD   Subjective   Still with ongoing left sided sharp chest pain.   Inpatient Medications    Scheduled Meds:  amiodarone  200 mg Oral Daily   apixaban  5 mg Oral BID   atorvastatin  10 mg Oral QPM   diltiazem  360 mg Oral Daily   docusate sodium  100 mg Oral BID   fenofibrate  54 mg Oral Daily   furosemide  40 mg Oral Daily   hydrALAZINE  10 mg Intravenous Once   levothyroxine  75 mcg Oral QAC breakfast   lidocaine  1 patch Transdermal Q24H   metoprolol succinate  100 mg Oral QPM   pantoprazole  40 mg Oral Daily   pentosan polysulfate  100 mg Oral BID WC   pilocarpine  1 drop Left Eye BID   sodium chloride flush  3 mL Intravenous Q12H   Continuous Infusions:  PRN Meds: acetaminophen **OR** acetaminophen, albuterol, bisacodyl, clidinium-chlordiazePOXIDE, hydrALAZINE, hyoscyamine, ondansetron **OR** ondansetron (ZOFRAN) IV, oxyCODONE, polyethylene glycol, polyvinyl alcohol   Vital Signs    Vitals:   12/23/21 1600 12/23/21 2000 12/24/21 0552 12/24/21 0722  BP: (!) 180/86 (!) 156/72 (!) 141/71 (!) 158/80  Pulse:  60 (!) 57 67  Resp:   18 17  Temp:   97.9 F (36.6 C) 97.9 F (36.6 C)  TempSrc:   Oral Oral  SpO2:  95% 93% 94%  Weight:   63.1 kg   Height:        Intake/Output Summary (Last 24 hours) at 12/24/2021 1101 Last data filed at 12/24/2021 0837 Gross per 24 hour  Intake 240 ml  Output --  Net 240 ml      12/24/2021    5:52 AM 12/22/2021    9:52 PM 08/21/2021    9:12 AM  Last 3 Weights  Weight (lbs) 139 lb 3.2 oz 141 lb 140 lb 6.4 oz  Weight (kg) 63.141 kg 63.957 kg 63.685 kg      Telemetry    Sinus Rhythm - Personally Reviewed  ECG    No new tracing  Physical Exam   GEN: No acute distress.   Neck: No JVD Cardiac: RRR, 3/6 systolic murmur LLSB, no rubs, or gallops.  Respiratory: Crackles in the RLL GI:  Soft, nontender, non-distended  MS: No edema; No deformity. Neuro:  Nonfocal  Psych: Normal affect   Labs    High Sensitivity Troponin:   Recent Labs  Lab 12/22/21 2339 12/23/21 0149 12/23/21 0542 12/23/21 0821 12/23/21 1057  TROPONINIHS 7 9 9 8 7      Chemistry Recent Labs  Lab 12/22/21 2208 12/24/21 0731  NA 139 138  K 4.3 3.9  CL 106 103  CO2 25 26  GLUCOSE 116* 92  BUN 36* 25*  CREATININE 1.76* 1.50*  CALCIUM 9.5 9.7  GFRNONAA 28* 34*  ANIONGAP 8 9    Lipids No results for input(s): "CHOL", "TRIG", "HDL", "LABVLDL", "LDLCALC", "CHOLHDL" in the last 168 hours.  Hematology Recent Labs  Lab 12/22/21 2208 12/24/21 0731  WBC 5.9 6.0  RBC 3.99 4.15  HGB 12.3 12.6  HCT 37.7 39.1  MCV 94.5 94.2  MCH 30.8 30.4  MCHC 32.6 32.2  RDW 15.1 14.9  PLT 226 222   Thyroid No results for input(s): "TSH", "FREET4" in the last 168 hours.  BNPNo results for input(s): "BNP", "PROBNP" in the last  168 hours.  DDimer No results for input(s): "DDIMER" in the last 168 hours.   Radiology    DG Chest 2 View  Result Date: 12/22/2021 CLINICAL DATA:  Chest pain. EXAM: CHEST - 2 VIEW COMPARISON:  Radiograph 03/18/2021 FINDINGS: The cardiomediastinal contours are normal. The lungs are clear. Pulmonary vasculature is normal. No consolidation, pleural effusion, or pneumothorax. Bilateral shoulder arthroplasties. No acute osseous abnormalities are seen. Surgical clips in the upper abdomen. IVC filter is partially visualized. IMPRESSION: No acute chest findings. Electronically Signed   By: Keith Rake M.D.   On: 12/22/2021 22:31    Cardiac Studies   Echo: pending  Patient Profile     86 y.o. female with a hx of persistent atrial fibrillation s/p multiple cardioversions, hypertension, OSA on CPAP, CKD, GERD, COPD and celiac disease who was seen 12/23/2021 for the evaluation of chest pain at the request of Dr. Lorin Mercy.   Assessment & Plan      Chest pain -- Symptoms are somewhat  atypical in nature though do have somewhat of an exertional component as well. High-sensitivity troponin cycled and negative x6.  EKG without acute ischemic changes.  Did complain of increased chest pain with inspiration and palpation over the left anterior chest. -- CRP 1.12, Sed rate 42 -- Echocardiogram pending -- add Lidoderm patch   Persistent atrial fibrillation S/p multiple cardioversions -- She has had 3 cardioversions this year and currently maintaining sinus rhythm -- Continue amiodarone, Toprol XL and diltiazem  -- Continue Eliquis, will increase back to 57m BID given improvement in SCr   HFmrEF -- LVEF of 40 to 45% on echocardiogram 02/2021, this was in the setting of acute illness with sepsis/influenza and A-fib RVR -- does have mild crackles in the RLL, will resume PO lasix 432mdaily -- Repeat echo pending   CKD stage III vs IV -- Baseline appears 1.2, recently 1.6 and up to 1.76 on admission with a GFR of 28, improved to 1.50 -- PTA losartan 25 mg daily held on admission along with Lasix -- Follow BMET   Hyperlipidemia -- hx of statin intolerance -- On Lipitor 10 mg daily, fenofibrate 54 mg daily   Hypertension -- Blood pressures still elevated by did not receives meds -- Continue diltiazem 360 mg daily, Toprol XL 100 mg daily -- Losartan held   Mild to moderate MR Low-flow low gradient moderate AS -- Noted on echocardiogram 02/2021 -- Prominent murmur on exam -- Echo pending  For questions or updates, please contact [place] consult [email or web address] for contact info under        Signed, LiReino BellisNP  12/24/2021, 11:01 AM    I have examined the patient and reviewed assessment and plan and discussed with patient.  Agree with above as stated.    I personally reviewed the ecoh.  No pericardial effusion  Pain is  a little better lidocaine patch applied.   Mild increase in inflammatory markers. No ST changes on ECG.  Therefore, most  likely chest wall inflammation as chest wall is tender to palpation.  Continue with pain patch.  SHe cannot toelrate NSAIDs.  Colchicine would also be difficult to use given her renal insufficiency.   Will add hydralazine for high BP. 25 mg BID to start and can increase dose as needed.   JaLarae Grooms

## 2021-12-24 NOTE — Progress Notes (Signed)
  Echocardiogram 2D Echocardiogram has been performed.  Amy Hickman 12/24/2021, 10:42 AM

## 2021-12-24 NOTE — Progress Notes (Signed)
12/24/21 1349  Mobility  Activity Ambulated independently in hallway  Level of Assistance Modified independent, requires aide device or extra time  Assistive Device None  Distance Ambulated (ft) 200 ft  Activity Response Tolerated well  $Mobility charge 1 Mobility   Mobility Specialist Progress Note  Pre-Mobility: 64 HR, 127/67 BP Post-Mobility: 66 HR, 147/73 BP  Received pt in bed having no complaints and agreeable to mobility. Pt was asymptomatic throughout ambulation and returned to room w/o fault. Left in bed w/ call bell in reach and all needs met.   Lucious Groves Mobility Specialist

## 2021-12-24 NOTE — Care Management Obs Status (Signed)
[place] NOTIFICATION   Patient Details  Name: Amy Hickman MRN: [id number] Date of Birth: 01/18/1936   Medicare Observation Status Notification Given:  Yes    Zenon Mayo, RN 12/24/2021, 11:56 AM

## 2021-12-24 NOTE — TOC Progression Note (Addendum)
Transition of Care [facility]) - Progression Note    Patient Details  Name: Amy Hickman MRN: [id number] Date of Birth: 1936/01/16  [facility] CM/SW Contact  Amy Mayo, RN Phone Number: 12/24/2021, 11:46 AM  Clinical Narrative:    Patient is from home with daughter Amy Hickman, patient is indep,  presents with chest pain, per pt eval, no pt follow up needed.  Conts on  po lasix, has afib, on eliquis pta,  Amy Hickman will make her follow up PCP apt. TOC following.         Expected Discharge Plan and Services                                                 Social Determinants of Health (SDOH) Interventions    Readmission Risk Interventions     No data to display

## 2021-12-25 DIAGNOSIS — I482 Chronic atrial fibrillation, unspecified: Secondary | ICD-10-CM | POA: Diagnosis not present

## 2021-12-25 DIAGNOSIS — R0789 Other chest pain: Secondary | ICD-10-CM | POA: Diagnosis not present

## 2021-12-25 DIAGNOSIS — R071 Chest pain on breathing: Secondary | ICD-10-CM | POA: Diagnosis not present

## 2021-12-25 DIAGNOSIS — N184 Chronic kidney disease, stage 4 (severe): Secondary | ICD-10-CM | POA: Diagnosis not present

## 2021-12-25 DIAGNOSIS — I10 Essential (primary) hypertension: Secondary | ICD-10-CM | POA: Diagnosis not present

## 2021-12-25 LAB — BASIC METABOLIC PANEL
Anion gap: 10 (ref 5–15)
BUN: 30 mg/dL — ABNORMAL HIGH (ref 8–23)
CO2: 26 mmol/L (ref 22–32)
Calcium: 10.4 mg/dL — ABNORMAL HIGH (ref 8.9–10.3)
Chloride: 101 mmol/L (ref 98–111)
Creatinine, Ser: 1.69 mg/dL — ABNORMAL HIGH (ref 0.44–1.00)
GFR, Estimated: 30 mL/min — ABNORMAL LOW (ref >60)
Glucose, Bld: 104 mg/dL — ABNORMAL HIGH (ref 70–99)
Potassium: 4 mmol/L (ref 3.5–5.1)
Sodium: 137 mmol/L (ref 135–145)

## 2021-12-25 MED ORDER — HYDRALAZINE HCL 50 MG PO TABS
50.0000 mg | ORAL_TABLET | Freq: Two times a day (BID) | ORAL | Status: DC
  Administered 2021-12-25 – 2021-12-27 (×4): 50 mg via ORAL
  Filled 2021-12-25 (×4): qty 1

## 2021-12-25 MED ORDER — HYDRALAZINE HCL 25 MG PO TABS
25.0000 mg | ORAL_TABLET | Freq: Once | ORAL | Status: AC
Start: 2021-12-25 — End: 2021-12-25
  Administered 2021-12-25: 25 mg via ORAL
  Filled 2021-12-25: qty 1

## 2021-12-25 MED ORDER — FUROSEMIDE 40 MG PO TABS
40.0000 mg | ORAL_TABLET | Freq: Every day | ORAL | Status: DC
  Administered 2021-12-25: 40 mg via ORAL
  Filled 2021-12-25: qty 1

## 2021-12-25 NOTE — Progress Notes (Signed)
PROGRESS NOTE    Amy Hickman  SLH:[id number] DOB: 11-01-1935 DOA: 12/22/2021 PCP: [facility]   Brief Narrative: 86 year old with past medical history significant for A-fib status post successful cardioversion on Eliquis, hypertension, CAD, chronic systolic heart failure presents with chest pain.  The chest pain has been on and off for a long time but getting worse.  She has had for cardioversion last 1 May. Present with chest pain, cardiology has been consulted.  Troponin  multiple times negative.   Assessment & Plan:   Principal Problem:   Chest pain Active Problems:   Atrial fibrillation, chronic (HCC)   Chronic systolic CHF (congestive heart failure) (HCC)   Celiac disease   CKD (chronic kidney disease) stage 4, GFR 15-29 ml/min (HCC)   Hypothyroidism   Essential hypertension   Hyperlipidemia   DNR (do not resuscitate)   Interstitial cystitis   Malnutrition of moderate degree   1-Chest pain: Costochondritis.  Atypical, reports chest pain after she had 2D echo worse with pressure from 2D echo probe. Scheduled Tylenol, lidocaine patch. She is not able to tolerate tramadol or any opioids. I would not want to use NSAIDs due to CKD Echo stable ejection fraction Started on colchicine.  Chest pain improved with lidocaine patch.   Chronic A-fib: Continue with Toprol and diltiazem Continue with amiodarone  Chronic Systolic heart failure Continue with Lasix  Celiac disease: She was seen by GI recently and had a CT with ?colitis -She appears to need colonoscopy for direct visualization as an outpatient -Continue Librax, Nexium (protonix per formulary), hyoscyamine.   CKD stage IV Avoid nephrotoxins.  Continue to hold losartan Monitor cr.   Interstitial cystitis Discontinue Elmiron, patient has not been taking it.   Hypertension: Continue with Toprol.  Holding losartan due to CKD. Continue amlodipine and diltiazem./   Hydralazine increase to 50 mg BID. Plan to monitor stability of BP today.    Estimated body mass index is 27.17 kg/m as calculated from the following:   Height as of this encounter: 5' (1.524 m).   Weight as of this encounter: 63.1 kg.   DVT prophylaxis: Eliquis Code Status: DNR Family Communication: care discussed with family Disposition Plan:  Status is: Observation The patient remains OBS appropriate and will d/c before 2 midnights.    Consultants:  Cardiology   Procedures:  ECHO  Antimicrobials:    Subjective: Chest pain improved with lidocaine patch.  She wants to make sure her BP is stable and better prior to discharge.   Objective: Vitals:   12/25/21 0530 12/25/21 0721 12/25/21 1200 12/25/21 1353  BP: (!) 158/81 (!) 167/72  (!) 143/77  Pulse:  (!) 54  62  Resp:      Temp: 97.8 F (36.6 C) (!) 97.5 F (36.4 C) 97.7 F (36.5 C)   TempSrc: Tympanic Oral Oral   SpO2:  93%  93%  Weight:      Height:        Intake/Output Summary (Last 24 hours) at 12/25/2021 1500 Last data filed at 12/25/2021 1300 Gross per 24 hour  Intake 1201 ml  Output --  Net 1201 ml   Filed Weights   12/22/21 2152 12/24/21 0552 12/25/21 0500  Weight: 64 kg 63.1 kg 63.1 kg    Examination:  General exam: NAD Respiratory system: CTA Cardiovascular system: S 1, S 2 RRR Gastrointestinal system: BS present, soft, nt Central nervous system: alert Extremities: no edema  Data Reviewed: I have personally reviewed  following labs and imaging studies  CBC: Recent Labs  Lab 12/22/21 2208 12/24/21 0731  WBC 5.9 6.0  HGB 12.3 12.6  HCT 37.7 39.1  MCV 94.5 94.2  PLT 226 809    Basic Metabolic Panel: Recent Labs  Lab 12/22/21 2208 12/24/21 0731 12/25/21 0350  NA 139 138 137  K 4.3 3.9 4.0  CL 106 103 101  CO2 25 26 26   GLUCOSE 116* 92 104*  BUN 36* 25* 30*  CREATININE 1.76* 1.50* 1.69*  CALCIUM 9.5 9.7 10.4*    GFR: Estimated Creatinine Clearance: 20.9 mL/min (A)  (by C-G formula based on SCr of 1.69 mg/dL (H)). Liver Function Tests: No results for input(s): "AST", "ALT", "ALKPHOS", "BILITOT", "PROT", "ALBUMIN" in the last 168 hours. No results for input(s): "LIPASE", "AMYLASE" in the last 168 hours. No results for input(s): "AMMONIA" in the last 168 hours. Coagulation Profile: No results for input(s): "INR", "PROTIME" in the last 168 hours. Cardiac Enzymes: No results for input(s): "CKTOTAL", "CKMB", "CKMBINDEX", "TROPONINI" in the last 168 hours. BNP (last 3 results) No results for input(s): "PROBNP" in the last 8760 hours. HbA1C: No results for input(s): "HGBA1C" in the last 72 hours. CBG: No results for input(s): "GLUCAP" in the last 168 hours. Lipid Profile: No results for input(s): "CHOL", "HDL", "LDLCALC", "TRIG", "CHOLHDL", "LDLDIRECT" in the last 72 hours. Thyroid Function Tests: No results for input(s): "TSH", "T4TOTAL", "FREET4", "T3FREE", "THYROIDAB" in the last 72 hours. Anemia Panel: No results for input(s): "VITAMINB12", "FOLATE", "FERRITIN", "TIBC", "IRON", "RETICCTPCT" in the last 72 hours. Sepsis Labs: No results for input(s): "PROCALCITON", "LATICACIDVEN" in the last 168 hours.  No results found for this or any previous visit (from the past 240 hour(s)).       Radiology Studies: ECHOCARDIOGRAM COMPLETE  Result Date: 12/24/2021    ECHOCARDIOGRAM REPORT   Patient Name:   BIRTTANY DECHELLIS Date of Exam: 12/24/2021 Medical Rec #:  [id number]       Height:       60.0 in Accession #:    [id number]      Weight:       139.2 lb Date of Birth:  06-Apr-1936      BSA:          1.600 m Patient Age:    87 years        BP:           178/80 mmHg Patient Gender: F               HR:           62 bpm. Exam Location:  Inpatient Procedure: 2D Echo, Cardiac Doppler and Color Doppler Indications:    Chest Pain  History:        Patient has prior history of Echocardiogram examinations, most                 recent 03/16/2021. CHF, CAD, Aortic Valve  Disease,                 Arrythmias:Atrial Fibrillation; Risk Factors:Hypertension.  Sonographer:    Eartha Inch Referring Phys: [phone] LINDSAY B ROBERTS  Sonographer Comments: Suboptimal apical window. IMPRESSIONS  1. Left ventricular ejection fraction, by estimation, is 40 to 45%. The left ventricle has mildly decreased function. The left ventricle demonstrates global hypokinesis. There is mild concentric left ventricular hypertrophy. Left ventricular diastolic parameters are indeterminate.  2. Right ventricular systolic function is normal. The right ventricular size is normal.  3. Left atrial  size was moderately dilated.  4. The mitral valve is normal in structure. No evidence of mitral valve regurgitation. No evidence of mitral stenosis.  5. The Aortic valve is thickened and moderately calcified. . There is mild calcification of the aortic valve. There is mild thickening of the aortic valve. Aortic valve regurgitation is moderate. Suspect low flow low gradient moderate aortic stenosis. Aortic valve area, by VTI measures 1.09 cm. Aortic valve mean gradient measures 11.0 mmHg. Aortic valve Vmax measures 2.02 m/s.  6. The inferior vena cava is normal in size with greater than 50% respiratory variability, suggesting right atrial pressure of 3 mmHg. Comparison(s): No significant change from prior study. FINDINGS  Left Ventricle: Left ventricular ejection fraction, by estimation, is 40 to 45%. The left ventricle has mildly decreased function. The left ventricle demonstrates global hypokinesis. The left ventricular internal cavity size was normal in size. There is  mild concentric left ventricular hypertrophy. Left ventricular diastolic parameters are indeterminate. Right Ventricle: The right ventricular size is normal. No increase in right ventricular wall thickness. Right ventricular systolic function is normal. Left Atrium: Left atrial size was moderately dilated. Right Atrium: Right atrial size was normal in  size. Pericardium: There is no evidence of pericardial effusion. Presence of epicardial fat layer. Mitral Valve: The mitral valve is normal in structure. No evidence of mitral valve regurgitation. No evidence of mitral valve stenosis. Tricuspid Valve: The tricuspid valve is normal in structure. Tricuspid valve regurgitation is trivial. No evidence of tricuspid stenosis. Aortic Valve: The Aortic valve is thickened and moderately calcified. There is mild calcification of the aortic valve. There is mild thickening of the aortic valve. Aortic valve regurgitation is moderate. Suspect low flow low gradient moderate aortic stenosis. Aortic valve mean gradient measures 11.0 mmHg. Aortic valve peak gradient measures 16.3 mmHg. Aortic valve area, by VTI measures 1.09 cm and SVI 33. Pulmonic Valve: The pulmonic valve was not well visualized. Pulmonic valve regurgitation is not visualized. No evidence of pulmonic stenosis. Aorta: The aortic root is normal in size and structure. Venous: The inferior vena cava is normal in size with greater than 50% respiratory variability, suggesting right atrial pressure of 3 mmHg. IAS/Shunts: No atrial level shunt detected by color flow Doppler.  LEFT VENTRICLE PLAX 2D LVIDd:         4.10 cm     Diastology LVIDs:         3.80 cm     LV e' medial:  3.75 cm/s LV PW:         1.00 cm     LV e' lateral: 4.50 cm/s LV IVS:        1.00 cm LVOT diam:     2.00 cm LV SV:         53 LV SV Index:   33 LVOT Area:     3.14 cm  LV Volumes (MOD) LV vol d, MOD A2C: 86.6 ml LV vol d, MOD A4C: 97.5 ml LV vol s, MOD A2C: 42.4 ml LV vol s, MOD A4C: 50.7 ml LV SV MOD A2C:     44.2 ml LV SV MOD A4C:     97.5 ml LV SV MOD BP:      44.4 ml RIGHT VENTRICLE             IVC RV S prime:     11.50 cm/s  IVC diam: 1.10 cm TAPSE (M-mode): 2.1 cm LEFT ATRIUM  Index        RIGHT ATRIUM           Index LA diam:        3.00 cm 1.87 cm/m   RA Area:     13.40 cm LA Vol (A2C):   60.1 ml 37.56 ml/m  RA Volume:   26.00  ml  16.25 ml/m LA Vol (A4C):   63.6 ml 39.75 ml/m LA Biplane Vol: 68.3 ml 42.68 ml/m  AORTIC VALVE AV Area (Vmax):    0.98 cm AV Area (Vmean):   0.97 cm AV Area (VTI):     1.09 cm AV Vmax:           202.00 cm/s AV Vmean:          160.000 cm/s AV VTI:            0.487 m AV Peak Grad:      16.3 mmHg AV Mean Grad:      11.0 mmHg LVOT Vmax:         63.00 cm/s LVOT Vmean:        49.600 cm/s LVOT VTI:          0.169 m LVOT/AV VTI ratio: 0.35  AORTA Ao Root diam: 3.20 cm  SHUNTS Systemic VTI:  0.17 m Systemic Diam: 2.00 cm Kardie Tobb DO Electronically signed by Berniece Salines DO Signature Date/Time: 12/24/2021/11:03:58 AM    Final         Scheduled Meds:  acetaminophen  500 mg Oral Q8H   amiodarone  200 mg Oral Daily   apixaban  5 mg Oral BID   atorvastatin  10 mg Oral QPM   diltiazem  360 mg Oral Daily   docusate sodium  100 mg Oral BID   feeding supplement  237 mL Oral BID BM   fenofibrate  54 mg Oral Daily   furosemide  40 mg Oral Daily   hydrALAZINE  50 mg Oral BID   levothyroxine  75 mcg Oral QAC breakfast   lidocaine  1 patch Transdermal Q24H   metoprolol succinate  100 mg Oral QPM   multivitamin with minerals  1 tablet Oral Daily   pantoprazole  40 mg Oral BID   pilocarpine  1 drop Left Eye BID   sodium chloride flush  3 mL Intravenous Q12H   Continuous Infusions:   LOS: 0 days    Time spent: 35 minutes    Carolene Gitto A Glendoris Nodarse, MD [facility]   If 7PM-7AM, please contact night-coverage [email or web address]  12/25/2021, 3:00 PM

## 2021-12-25 NOTE — Progress Notes (Signed)
   12/25/21 1118  Mobility  Activity Ambulated independently in hallway  Level of Assistance Independent  Assistive Device None  Distance Ambulated (ft) 550 ft  Activity Response Tolerated well  $Mobility charge 1 Mobility   Mobility Specialist Progress Note  Received pt in bed having no complaints and agreeable to mobility. Pt was asymptomatic throughout ambulation and returned to room w/o fault. Left in bed w/ call bell in reach and all needs met.   Lucious Groves Mobility Specialist

## 2021-12-25 NOTE — Progress Notes (Addendum)
Rounding Note    Patient Name: Amy Hickman Date of Encounter: 12/25/2021  [place] Cardiologist: Buford Dresser, MD   Subjective   Feeling better this morning. Chest wall pain significantly improved with lidoderm patch.   Inpatient Medications    Scheduled Meds:  acetaminophen  500 mg Oral Q8H   amiodarone  200 mg Oral Daily   amLODipine  2.5 mg Oral Daily   apixaban  5 mg Oral BID   atorvastatin  10 mg Oral QPM   diltiazem  360 mg Oral Daily   docusate sodium  100 mg Oral BID   feeding supplement  237 mL Oral BID BM   fenofibrate  54 mg Oral Daily   furosemide  40 mg Oral Daily   hydrALAZINE  25 mg Oral BID   levothyroxine  75 mcg Oral QAC breakfast   lidocaine  1 patch Transdermal Q24H   metoprolol succinate  100 mg Oral QPM   multivitamin with minerals  1 tablet Oral Daily   pantoprazole  40 mg Oral BID   pentosan polysulfate  100 mg Oral BID WC   pilocarpine  1 drop Left Eye BID   sodium chloride flush  3 mL Intravenous Q12H   Continuous Infusions:  PRN Meds: acetaminophen **OR** acetaminophen, albuterol, bisacodyl, clidinium-chlordiazePOXIDE, hydrALAZINE, hyoscyamine, ondansetron **OR** ondansetron (ZOFRAN) IV, oxyCODONE, polyethylene glycol, polyvinyl alcohol   Vital Signs    Vitals:   12/24/21 1930 12/25/21 0500 12/25/21 0530 12/25/21 0721  BP: 130/77  (!) 158/81 (!) 167/72  Pulse: (!) 59   (!) 54  Resp: 20     Temp: 97.8 F (36.6 C)  97.8 F (36.6 C) (!) 97.5 F (36.4 C)  TempSrc: Oral  Tympanic Oral  SpO2: 94%   93%  Weight:  63.1 kg    Height:        Intake/Output Summary (Last 24 hours) at 12/25/2021 0929 Last data filed at 12/25/2021 0600 Gross per 24 hour  Intake 1201 ml  Output --  Net 1201 ml      12/25/2021    5:00 AM 12/24/2021    5:52 AM 12/22/2021    9:52 PM  Last 3 Weights  Weight (lbs) 139 lb 1.6 oz 139 lb 3.2 oz 141 lb  Weight (kg) 63.095 kg 63.141 kg 63.957 kg      Telemetry    Sinus Rhythm -  Personally Reviewed  ECG    No new tracing  Physical Exam   GEN: No acute distress.   Neck: No JVD Cardiac: RRR, 3/6 systolic murmur, no rubs, or gallops.  Respiratory: Clear to auscultation bilaterally. GI: Soft, nontender, non-distended  MS: No edema; No deformity. Neuro:  Nonfocal  Psych: Normal affect   Labs    High Sensitivity Troponin:   Recent Labs  Lab 12/22/21 2339 12/23/21 0149 12/23/21 0542 12/23/21 0821 12/23/21 1057  TROPONINIHS 7 9 9 8 7      Chemistry Recent Labs  Lab 12/22/21 2208 12/24/21 0731 12/25/21 0350  NA 139 138 137  K 4.3 3.9 4.0  CL 106 103 101  CO2 25 26 26   GLUCOSE 116* 92 104*  BUN 36* 25* 30*  CREATININE 1.76* 1.50* 1.69*  CALCIUM 9.5 9.7 10.4*  GFRNONAA 28* 34* 30*  ANIONGAP 8 9 10     Lipids No results for input(s): "CHOL", "TRIG", "HDL", "LABVLDL", "LDLCALC", "CHOLHDL" in the last 168 hours.  Hematology Recent Labs  Lab 12/22/21 2208 12/24/21 0731  WBC 5.9 6.0  RBC  3.99 4.15  HGB 12.3 12.6  HCT 37.7 39.1  MCV 94.5 94.2  MCH 30.8 30.4  MCHC 32.6 32.2  RDW 15.1 14.9  PLT 226 222   Thyroid No results for input(s): "TSH", "FREET4" in the last 168 hours.  BNPNo results for input(s): "BNP", "PROBNP" in the last 168 hours.  DDimer No results for input(s): "DDIMER" in the last 168 hours.   Radiology    ECHOCARDIOGRAM COMPLETE  Result Date: 12/24/2021    ECHOCARDIOGRAM REPORT   Patient Name:   Amy Hickman Date of Exam: 12/24/2021 Medical Rec #:  [id number]       Height:       60.0 in Accession #:    [id number]      Weight:       139.2 lb Date of Birth:  1935/11/25      BSA:          1.600 m Patient Age:    86 years        BP:           178/80 mmHg Patient Gender: F               HR:           62 bpm. Exam Location:  Inpatient Procedure: 2D Echo, Cardiac Doppler and Color Doppler Indications:    Chest Pain  History:        Patient has prior history of Echocardiogram examinations, most                 recent 03/16/2021. CHF,  CAD, Aortic Valve Disease,                 Arrythmias:Atrial Fibrillation; Risk Factors:Hypertension.  Sonographer:    Eartha Inch Referring Phys: [phone] LINDSAY B ROBERTS  Sonographer Comments: Suboptimal apical window. IMPRESSIONS  1. Left ventricular ejection fraction, by estimation, is 40 to 45%. The left ventricle has mildly decreased function. The left ventricle demonstrates global hypokinesis. There is mild concentric left ventricular hypertrophy. Left ventricular diastolic parameters are indeterminate.  2. Right ventricular systolic function is normal. The right ventricular size is normal.  3. Left atrial size was moderately dilated.  4. The mitral valve is normal in structure. No evidence of mitral valve regurgitation. No evidence of mitral stenosis.  5. The Aortic valve is thickened and moderately calcified. . There is mild calcification of the aortic valve. There is mild thickening of the aortic valve. Aortic valve regurgitation is moderate. Suspect low flow low gradient moderate aortic stenosis. Aortic valve area, by VTI measures 1.09 cm. Aortic valve mean gradient measures 11.0 mmHg. Aortic valve Vmax measures 2.02 m/s.  6. The inferior vena cava is normal in size with greater than 50% respiratory variability, suggesting right atrial pressure of 3 mmHg. Comparison(s): No significant change from prior study. FINDINGS  Left Ventricle: Left ventricular ejection fraction, by estimation, is 40 to 45%. The left ventricle has mildly decreased function. The left ventricle demonstrates global hypokinesis. The left ventricular internal cavity size was normal in size. There is  mild concentric left ventricular hypertrophy. Left ventricular diastolic parameters are indeterminate. Right Ventricle: The right ventricular size is normal. No increase in right ventricular wall thickness. Right ventricular systolic function is normal. Left Atrium: Left atrial size was moderately dilated. Right Atrium: Right atrial  size was normal in size. Pericardium: There is no evidence of pericardial effusion. Presence of epicardial fat layer. Mitral Valve: The mitral valve is normal in structure. No  evidence of mitral valve regurgitation. No evidence of mitral valve stenosis. Tricuspid Valve: The tricuspid valve is normal in structure. Tricuspid valve regurgitation is trivial. No evidence of tricuspid stenosis. Aortic Valve: The Aortic valve is thickened and moderately calcified. There is mild calcification of the aortic valve. There is mild thickening of the aortic valve. Aortic valve regurgitation is moderate. Suspect low flow low gradient moderate aortic stenosis. Aortic valve mean gradient measures 11.0 mmHg. Aortic valve peak gradient measures 16.3 mmHg. Aortic valve area, by VTI measures 1.09 cm and SVI 33. Pulmonic Valve: The pulmonic valve was not well visualized. Pulmonic valve regurgitation is not visualized. No evidence of pulmonic stenosis. Aorta: The aortic root is normal in size and structure. Venous: The inferior vena cava is normal in size with greater than 50% respiratory variability, suggesting right atrial pressure of 3 mmHg. IAS/Shunts: No atrial level shunt detected by color flow Doppler.  LEFT VENTRICLE PLAX 2D LVIDd:         4.10 cm     Diastology LVIDs:         3.80 cm     LV e' medial:  3.75 cm/s LV PW:         1.00 cm     LV e' lateral: 4.50 cm/s LV IVS:        1.00 cm LVOT diam:     2.00 cm LV SV:         53 LV SV Index:   33 LVOT Area:     3.14 cm  LV Volumes (MOD) LV vol d, MOD A2C: 86.6 ml LV vol d, MOD A4C: 97.5 ml LV vol s, MOD A2C: 42.4 ml LV vol s, MOD A4C: 50.7 ml LV SV MOD A2C:     44.2 ml LV SV MOD A4C:     97.5 ml LV SV MOD BP:      44.4 ml RIGHT VENTRICLE             IVC RV S prime:     11.50 cm/s  IVC diam: 1.10 cm TAPSE (M-mode): 2.1 cm LEFT ATRIUM             Index        RIGHT ATRIUM           Index LA diam:        3.00 cm 1.87 cm/m   RA Area:     13.40 cm LA Vol (A2C):   60.1 ml 37.56 ml/m   RA Volume:   26.00 ml  16.25 ml/m LA Vol (A4C):   63.6 ml 39.75 ml/m LA Biplane Vol: 68.3 ml 42.68 ml/m  AORTIC VALVE AV Area (Vmax):    0.98 cm AV Area (Vmean):   0.97 cm AV Area (VTI):     1.09 cm AV Vmax:           202.00 cm/s AV Vmean:          160.000 cm/s AV VTI:            0.487 m AV Peak Grad:      16.3 mmHg AV Mean Grad:      11.0 mmHg LVOT Vmax:         63.00 cm/s LVOT Vmean:        49.600 cm/s LVOT VTI:          0.169 m LVOT/AV VTI ratio: 0.35  AORTA Ao Root diam: 3.20 cm  SHUNTS Systemic VTI:  0.17 m Systemic Diam: 2.00 cm  Berniece Salines DO Electronically signed by Berniece Salines DO Signature Date/Time: 12/24/2021/11:03:58 AM    Final     Cardiac Studies   Echo: 12/24/21  IMPRESSIONS     1. Left ventricular ejection fraction, by estimation, is 40 to 45%. The  left ventricle has mildly decreased function. The left ventricle  demonstrates global hypokinesis. There is mild concentric left ventricular  hypertrophy. Left ventricular diastolic  parameters are indeterminate.   2. Right ventricular systolic function is normal. The right ventricular  size is normal.   3. Left atrial size was moderately dilated.   4. The mitral valve is normal in structure. No evidence of mitral valve  regurgitation. No evidence of mitral stenosis.   5. The Aortic valve is thickened and moderately calcified. . There is  mild calcification of the aortic valve. There is mild thickening of the  aortic valve. Aortic valve regurgitation is moderate. Suspect low flow low  gradient moderate aortic stenosis.  Aortic valve area, by VTI measures 1.09 cm. Aortic valve mean gradient  measures 11.0 mmHg. Aortic valve Vmax measures 2.02 m/s.   6. The inferior vena cava is normal in size with greater than 50%  respiratory variability, suggesting right atrial pressure of 3 mmHg.   Comparison(s): No significant change from prior study.   FINDINGS   Left Ventricle: Left ventricular ejection fraction, by estimation, is 40   to 45%. The left ventricle has mildly decreased function. The left  ventricle demonstrates global hypokinesis. The left ventricular internal  cavity size was normal in size. There is   mild concentric left ventricular hypertrophy. Left ventricular diastolic  parameters are indeterminate.   Right Ventricle: The right ventricular size is normal. No increase in  right ventricular wall thickness. Right ventricular systolic function is  normal.   Left Atrium: Left atrial size was moderately dilated.   Right Atrium: Right atrial size was normal in size.   Pericardium: There is no evidence of pericardial effusion. Presence of  epicardial fat layer.   Mitral Valve: The mitral valve is normal in structure. No evidence of  mitral valve regurgitation. No evidence of mitral valve stenosis.   Tricuspid Valve: The tricuspid valve is normal in structure. Tricuspid  valve regurgitation is trivial. No evidence of tricuspid stenosis.   Aortic Valve: The Aortic valve is thickened and moderately calcified.  There is mild calcification of the aortic valve. There is mild thickening  of the aortic valve. Aortic valve regurgitation is moderate. Suspect low  flow low gradient moderate aortic  stenosis. Aortic valve mean gradient measures 11.0 mmHg. Aortic valve peak  gradient measures 16.3 mmHg. Aortic valve area, by VTI measures 1.09 cm  and SVI 33.   Pulmonic Valve: The pulmonic valve was not well visualized. Pulmonic valve  regurgitation is not visualized. No evidence of pulmonic stenosis.   Aorta: The aortic root is normal in size and structure.   Venous: The inferior vena cava is normal in size with greater than 50%  respiratory variability, suggesting right atrial pressure of 3 mmHg.   IAS/Shunts: No atrial level shunt detected by color flow Doppler.   Patient Profile     86 y.o. female with a hx of persistent atrial fibrillation s/p multiple cardioversions, hypertension, OSA on CPAP, CKD,  GERD, COPD and celiac disease who was seen 12/23/2021 for the evaluation of chest pain at the request of Dr. Lorin Mercy.  Assessment & Plan    Chest pain -- Symptoms are somewhat atypical in nature though do  have somewhat of an exertional component as well. High-sensitivity troponin cycled and negative x6.  EKG without acute ischemic changes.  Did complain of increased chest pain with inspiration and palpation over the left anterior chest. Echo probe made the pain worse. 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 -- Baseline appears 1.2, recently 1.6 and up to 1.76 on admission with a GFR of 28, improved to 1.50 -- PTA losartan 25 mg daily held on admission -- Follow BMET   Hyperlipidemia -- hx of statin intolerance -- On Lipitor 10 mg daily, fenofibrate 54 mg daily   Hypertension -- Blood pressures still elevated by did not receives meds -- Continue diltiazem 360 mg daily, Toprol XL 100 mg daily, will further increase hydralazine to 5069mID -- Losartan held   Mild to moderate MR Low-flow low gradient moderate AS -- Noted on echocardiogram 02/2021, no change on echo this admission  For questions or updates, please contact [facility] consult [email or web address] for contact info under        Signed, LinReino BellisP  12/25/2021, 9:29 AM    I have examined the patient and reviewed assessment and plan and discussed with patient.   Agree with above as stated.    Chest pain improved this morning.  I suspect the cause of her pain is more chest wall pain.  ECG not suggestive of pericarditis.  Continue with lidocaine patch for pain relief.  In terms of her atrial fibrillation, continue Eliquis for stroke prevention and her rhythm control strategy with amiodarone.  Increasing hydralazine to 50 mg twice daily.  May need to change to 3 times daily if blood pressure stay elevated.  It looks like the very high readings like she had yesterday in the 190s, have improved.  JayLarae Grooms

## 2021-12-26 ENCOUNTER — Observation Stay (HOSPITAL_COMMUNITY): Payer: Medicare Other

## 2021-12-26 DIAGNOSIS — R0789 Other chest pain: Secondary | ICD-10-CM | POA: Diagnosis not present

## 2021-12-26 DIAGNOSIS — R071 Chest pain on breathing: Secondary | ICD-10-CM | POA: Diagnosis not present

## 2021-12-26 LAB — BASIC METABOLIC PANEL
Anion gap: 10 (ref 5–15)
BUN: 41 mg/dL — ABNORMAL HIGH (ref 8–23)
CO2: 28 mmol/L (ref 22–32)
Calcium: 10.7 mg/dL — ABNORMAL HIGH (ref 8.9–10.3)
Chloride: 98 mmol/L (ref 98–111)
Creatinine, Ser: 2.09 mg/dL — ABNORMAL HIGH (ref 0.44–1.00)
GFR, Estimated: 23 mL/min — ABNORMAL LOW (ref >60)
Glucose, Bld: 102 mg/dL — ABNORMAL HIGH (ref 70–99)
Potassium: 4.5 mmol/L (ref 3.5–5.1)
Sodium: 136 mmol/L (ref 135–145)

## 2021-12-26 LAB — URINALYSIS, ROUTINE W REFLEX MICROSCOPIC
Bilirubin Urine: NEGATIVE
Glucose, UA: NEGATIVE mg/dL
Hgb urine dipstick: NEGATIVE
Ketones, ur: NEGATIVE mg/dL
Leukocytes,Ua: NEGATIVE
Nitrite: NEGATIVE
Protein, ur: NEGATIVE mg/dL
Specific Gravity, Urine: 1.008 (ref 1.005–1.030)
pH: 6 (ref 5.0–8.0)

## 2021-12-26 MED ORDER — APIXABAN 2.5 MG PO TABS
2.5000 mg | ORAL_TABLET | Freq: Two times a day (BID) | ORAL | Status: DC
  Administered 2021-12-26 – 2021-12-27 (×2): 2.5 mg via ORAL
  Filled 2021-12-26 (×2): qty 1

## 2021-12-26 NOTE — Progress Notes (Signed)
PROGRESS NOTE    Amy Hickman  YOV:[id number] DOB: 1935-09-13 DOA: 12/22/2021 PCP: [facility]   Brief Narrative: 86 year old with past medical history significant for A-fib status post successful cardioversion on Eliquis, hypertension, CAD, chronic systolic heart failure presents with chest pain.  The chest pain has been on and off for a long time but getting worse.  She has had for cardioversion last 1 May. Present with chest pain, cardiology has been consulted.  Troponin  multiple times negative.   Assessment & Plan:   Principal Problem:   Chest pain Active Problems:   Atrial fibrillation, chronic (HCC)   Chronic systolic CHF (congestive heart failure) (HCC)   Celiac disease   CKD (chronic kidney disease) stage 4, GFR 15-29 ml/min (HCC)   Hypothyroidism   Essential hypertension   Hyperlipidemia   DNR (do not resuscitate)   Interstitial cystitis   Malnutrition of moderate degree   1-Chest pain: Costochondritis.  Atypical, reports chest pain after she had 2D echo worse with pressure from 2D echo probe. Scheduled Tylenol, lidocaine patch. She is not able to tolerate tramadol or any opioids. I would not want to use NSAIDs due to CKD Echo stable ejection fraction Chest pain improved with lidocaine patch.   CKD stage IV: prior Cr 1.6---1.9 Avoid nephrotoxins.  Continue to hold losartan Monitor cr.  Cr increase to 2, renal US negative for hydronephrosis.  Plan to hold lasix, repeat renal function in am.   Chronic A-fib: Continue with Toprol and diltiazem Continue with amiodarone  Chronic Systolic heart failure Hold lasix due to AKI.   Celiac disease: She was seen by GI recently and had a CT with ?colitis -She appears to need colonoscopy for direct visualization as an outpatient -Continue Librax, Nexium (protonix per formulary), hyoscyamine.   Interstitial cystitis Discontinue Elmiron, patient has not been taking it.    Hypertension: Continue with Toprol.  Holding losartan due to CKD. Continue amlodipine and diltiazem./  Hydralazine increase to 50 mg BID. Holder parameter to avoid hypotension.    Estimated body mass index is 27.32 kg/m as calculated from the following:   Height as of this encounter: 5' (1.524 m).   Weight as of this encounter: 63.5 kg.   DVT prophylaxis: Eliquis Code Status: DNR Family Communication: care discussed with family Disposition Plan:  Status is: Observation The patient remains OBS appropriate and will d/c before 2 midnights.    Consultants:  Cardiology   Procedures:  ECHO  Antimicrobials:    Subjective: Chest pain improved.  Cr increase to 2.   Objective: Vitals:   12/25/21 2050 12/26/21 0030 12/26/21 0404 12/26/21 0842  BP: 130/61 (!) 100/55 132/68 131/65  Pulse:  (!) 56 (!) 56 60  Resp:  16 18 18   Temp:  98.1 F (36.7 C) 98.2 F (36.8 C) 98 F (36.7 C)  TempSrc:  Oral Oral Oral  SpO2:  93% 94% 93%  Weight:   63.5 kg   Height:        Intake/Output Summary (Last 24 hours) at 12/26/2021 1251 Last data filed at 12/26/2021 1100 Gross per 24 hour  Intake 1020 ml  Output 100 ml  Net 920 ml    Filed Weights   12/24/21 0552 12/25/21 0500 12/26/21 0404  Weight: 63.1 kg 63.1 kg 63.5 kg    Examination:  General exam: NAD Respiratory system: CTA Cardiovascular system: S1, S2  RRR Gastrointestinal system: BS present, soft nt Central nervous system: Alert Extremities: no edema  Data Reviewed: I have personally reviewed following labs and imaging studies  CBC: Recent Labs  Lab 12/22/21 2208 12/24/21 0731  WBC 5.9 6.0  HGB 12.3 12.6  HCT 37.7 39.1  MCV 94.5 94.2  PLT 226 026    Basic Metabolic Panel: Recent Labs  Lab 12/22/21 2208 12/24/21 0731 12/25/21 0350 12/26/21 0554  NA 139 138 137 136  K 4.3 3.9 4.0 4.5  CL 106 103 101 98  CO2 25 26 26 28   GLUCOSE 116* 92 104* 102*  BUN 36* 25* 30* 41*  CREATININE 1.76* 1.50* 1.69*  2.09*  CALCIUM 9.5 9.7 10.4* 10.7*    GFR: Estimated Creatinine Clearance: 17 mL/min (A) (by C-G formula based on SCr of 2.09 mg/dL (H)). Liver Function Tests: No results for input(s): "AST", "ALT", "ALKPHOS", "BILITOT", "PROT", "ALBUMIN" in the last 168 hours. No results for input(s): "LIPASE", "AMYLASE" in the last 168 hours. No results for input(s): "AMMONIA" in the last 168 hours. Coagulation Profile: No results for input(s): "INR", "PROTIME" in the last 168 hours. Cardiac Enzymes: No results for input(s): "CKTOTAL", "CKMB", "CKMBINDEX", "TROPONINI" in the last 168 hours. BNP (last 3 results) No results for input(s): "PROBNP" in the last 8760 hours. HbA1C: No results for input(s): "HGBA1C" in the last 72 hours. CBG: No results for input(s): "GLUCAP" in the last 168 hours. Lipid Profile: No results for input(s): "CHOL", "HDL", "LDLCALC", "TRIG", "CHOLHDL", "LDLDIRECT" in the last 72 hours. Thyroid Function Tests: No results for input(s): "TSH", "T4TOTAL", "FREET4", "T3FREE", "THYROIDAB" in the last 72 hours. Anemia Panel: No results for input(s): "VITAMINB12", "FOLATE", "FERRITIN", "TIBC", "IRON", "RETICCTPCT" in the last 72 hours. Sepsis Labs: No results for input(s): "PROCALCITON", "LATICACIDVEN" in the last 168 hours.  No results found for this or any previous visit (from the past 240 hour(s)).       Radiology Studies: US RENAL  Result Date: 12/26/2021 CLINICAL DATA:  Acute kidney injury EXAM: RENAL / URINARY TRACT ULTRASOUND COMPLETE COMPARISON:  None Available. FINDINGS: Right Kidney: Renal measurements: 6.8 x 4.0 x 3.5 cm = volume: 50 mL. Echogenicity within normal limits. No mass or hydronephrosis visualized. Left Kidney: Renal measurements: 9.1 x 3.6 x 4.3 cm = volume: 72.3 mL. Echogenicity within normal limits. No mass or hydronephrosis visualized. Bladder: Appears normal for degree of bladder distention. Other: None. IMPRESSION: No hydronephrosis. Electronically  Signed   By: Yetta Glassman M.D.   On: 12/26/2021 09:51        Scheduled Meds:  acetaminophen  500 mg Oral Q8H   amiodarone  200 mg Oral Daily   apixaban  2.5 mg Oral BID   atorvastatin  10 mg Oral QPM   diltiazem  360 mg Oral Daily   docusate sodium  100 mg Oral BID   feeding supplement  237 mL Oral BID BM   hydrALAZINE  50 mg Oral BID   levothyroxine  75 mcg Oral QAC breakfast   lidocaine  1 patch Transdermal Q24H   metoprolol succinate  100 mg Oral QPM   multivitamin with minerals  1 tablet Oral Daily   pantoprazole  40 mg Oral BID   pilocarpine  1 drop Left Eye BID   sodium chloride flush  3 mL Intravenous Q12H   Continuous Infusions:   LOS: 0 days    Time spent: 35 minutes    Janny Crute A Tejah Brekke, MD [facility]   If 7PM-7AM, please contact night-coverage [email or web address]  12/26/2021, 12:51 PM

## 2021-12-27 DIAGNOSIS — R071 Chest pain on breathing: Secondary | ICD-10-CM | POA: Diagnosis not present

## 2021-12-27 DIAGNOSIS — R0789 Other chest pain: Secondary | ICD-10-CM | POA: Diagnosis not present

## 2021-12-27 DIAGNOSIS — Z66 Do not resuscitate: Secondary | ICD-10-CM | POA: Diagnosis not present

## 2021-12-27 DIAGNOSIS — N184 Chronic kidney disease, stage 4 (severe): Secondary | ICD-10-CM | POA: Diagnosis not present

## 2021-12-27 DIAGNOSIS — I5032 Chronic diastolic (congestive) heart failure: Secondary | ICD-10-CM | POA: Diagnosis not present

## 2021-12-27 DIAGNOSIS — I482 Chronic atrial fibrillation, unspecified: Secondary | ICD-10-CM | POA: Diagnosis not present

## 2021-12-27 LAB — BASIC METABOLIC PANEL
Anion gap: 3 — ABNORMAL LOW (ref 5–15)
BUN: 48 mg/dL — ABNORMAL HIGH (ref 8–23)
CO2: 33 mmol/L — ABNORMAL HIGH (ref 22–32)
Calcium: 10 mg/dL (ref 8.9–10.3)
Chloride: 100 mmol/L (ref 98–111)
Creatinine, Ser: 2.03 mg/dL — ABNORMAL HIGH (ref 0.44–1.00)
GFR, Estimated: 24 mL/min — ABNORMAL LOW (ref >60)
Glucose, Bld: 102 mg/dL — ABNORMAL HIGH (ref 70–99)
Potassium: 4.3 mmol/L (ref 3.5–5.1)
Sodium: 136 mmol/L (ref 135–145)

## 2021-12-27 MED ORDER — HYDRALAZINE HCL 50 MG PO TABS: 50.0000 mg | ORAL_TABLET | Freq: Two times a day (BID) | ORAL | 3 refills | Status: DC

## 2021-12-27 MED ORDER — LIDOCAINE 5 % EX PTCH: 1.0000 | MEDICATED_PATCH | CUTANEOUS | 0 refills | Status: DC

## 2021-12-27 NOTE — Progress Notes (Signed)
Rounding Note    Patient Name: Amy Hickman Date of Encounter: 12/27/2021  [facility] Cardiologist: Buford Dresser, MD   Subjective   Blood pressure has improved; creatinine is at plateu. No CP No further DOE. Had some dizziness this AM Headache has resolved.  Inpatient Medications    Scheduled Meds:  acetaminophen  500 mg Oral Q8H   amiodarone  200 mg Oral Daily   apixaban  2.5 mg Oral BID   atorvastatin  10 mg Oral QPM   diltiazem  360 mg Oral Daily   docusate sodium  100 mg Oral BID   feeding supplement  237 mL Oral BID BM   hydrALAZINE  50 mg Oral BID   levothyroxine  75 mcg Oral QAC breakfast   lidocaine  1 patch Transdermal Q24H   metoprolol succinate  100 mg Oral QPM   multivitamin with minerals  1 tablet Oral Daily   pantoprazole  40 mg Oral BID   pilocarpine  1 drop Left Eye BID   sodium chloride flush  3 mL Intravenous Q12H   Continuous Infusions:  PRN Meds: acetaminophen **OR** acetaminophen, albuterol, bisacodyl, clidinium-chlordiazePOXIDE, hydrALAZINE, hyoscyamine, ondansetron **OR** ondansetron (ZOFRAN) IV, oxyCODONE, polyethylene glycol, polyvinyl alcohol   Vital Signs    Vitals:   12/26/21 1948 12/27/21 0436 12/27/21 0437 12/27/21 0814  BP: (!) 147/65 132/70    Pulse: (!) 59 (!) 58    Resp: 18 17  18   Temp: 97.6 F (36.4 C) 98.7 F (37.1 C)  98.1 F (36.7 C)  TempSrc: Oral Oral  Oral  SpO2: 94% 94%    Weight:   63.9 kg   Height:        Intake/Output Summary (Last 24 hours) at 12/27/2021 0856 Last data filed at 12/27/2021 0827 Gross per 24 hour  Intake 1440 ml  Output 1750 ml  Net -310 ml      12/27/2021    4:37 AM 12/26/2021    4:04 AM 12/25/2021    5:00 AM  Last 3 Weights  Weight (lbs) 140 lb 14.4 oz 139 lb 14.4 oz 139 lb 1.6 oz  Weight (kg) 63.912 kg 63.458 kg 63.095 kg      Telemetry    Sinus bradycardia 1st heart block- Personally Reviewed  Physical Exam   GEN: No acute distress.   Neck: No  JVD Cardiac: regular bradycardia no rubs, or gallops.  Respiratory: Clear to auscultation bilaterally. GI: Soft, nontender, non-distended  MS: No edema; No deformity. Neuro:  Nonfocal  Psych: Normal affect   Labs    High Sensitivity Troponin:   Recent Labs  Lab 12/22/21 2339 12/23/21 0149 12/23/21 0542 12/23/21 0821 12/23/21 1057  TROPONINIHS 7 9 9 8 7      Chemistry Recent Labs  Lab 12/25/21 0350 12/26/21 0554 12/27/21 0307  NA 137 136 136  K 4.0 4.5 4.3  CL 101 98 100  CO2 26 28 33*  GLUCOSE 104* 102* 102*  BUN 30* 41* 48*  CREATININE 1.69* 2.09* 2.03*  CALCIUM 10.4* 10.7* 10.0  GFRNONAA 30* 23* 24*  ANIONGAP 10 10 3*    Lipids No results for input(s): "CHOL", "TRIG", "HDL", "LABVLDL", "LDLCALC", "CHOLHDL" in the last 168 hours.  Hematology Recent Labs  Lab 12/22/21 2208 12/24/21 0731  WBC 5.9 6.0  RBC 3.99 4.15  HGB 12.3 12.6  HCT 37.7 39.1  MCV 94.5 94.2  MCH 30.8 30.4  MCHC 32.6 32.2  RDW 15.1 14.9  PLT 226 222   Thyroid  No results for input(s): "TSH", "FREET4" in the last 168 hours.  BNPNo results for input(s): "BNP", "PROBNP" in the last 168 hours.  DDimer No results for input(s): "DDIMER" in the last 168 hours.   Radiology    US RENAL  Result Date: 12/26/2021 CLINICAL DATA:  Acute kidney injury EXAM: RENAL / URINARY TRACT ULTRASOUND COMPLETE COMPARISON:  None Available. FINDINGS: Right Kidney: Renal measurements: 6.8 x 4.0 x 3.5 cm = volume: 50 mL. Echogenicity within normal limits. No mass or hydronephrosis visualized. Left Kidney: Renal measurements: 9.1 x 3.6 x 4.3 cm = volume: 72.3 mL. Echogenicity within normal limits. No mass or hydronephrosis visualized. Bladder: Appears normal for degree of bladder distention. Other: None. IMPRESSION: No hydronephrosis. Electronically Signed   By: Yetta Glassman M.D.   On: 12/26/2021 09:51    Cardiac Studies   Echo: 12/24/21  IMPRESSIONS     1. Left ventricular ejection fraction, by estimation, is 40  to 45%. The  left ventricle has mildly decreased function. The left ventricle  demonstrates global hypokinesis. There is mild concentric left ventricular  hypertrophy. Left ventricular diastolic  parameters are indeterminate.   2. Right ventricular systolic function is normal. The right ventricular  size is normal.   3. Left atrial size was moderately dilated.   4. The mitral valve is normal in structure. No evidence of mitral valve  regurgitation. No evidence of mitral stenosis.   5. The Aortic valve is thickened and moderately calcified. . There is  mild calcification of the aortic valve. There is mild thickening of the  aortic valve. Aortic valve regurgitation is moderate. Suspect low flow low  gradient moderate aortic stenosis.  Aortic valve area, by VTI measures 1.09 cm. Aortic valve mean gradient  measures 11.0 mmHg. Aortic valve Vmax measures 2.02 m/s.   6. The inferior vena cava is normal in size with greater than 50%  respiratory variability, suggesting right atrial pressure of 3 mmHg.   Comparison(s): No significant change from prior study.   FINDINGS   Left Ventricle: Left ventricular ejection fraction, by estimation, is 40  to 45%. The left ventricle has mildly decreased function. The left  ventricle demonstrates global hypokinesis. The left ventricular internal  cavity size was normal in size. There is   mild concentric left ventricular hypertrophy. Left ventricular diastolic  parameters are indeterminate.   Right Ventricle: The right ventricular size is normal. No increase in  right ventricular wall thickness. Right ventricular systolic function is  normal.   Left Atrium: Left atrial size was moderately dilated.   Right Atrium: Right atrial size was normal in size.   Pericardium: There is no evidence of pericardial effusion. Presence of  epicardial fat layer.   Mitral Valve: The mitral valve is normal in structure. No evidence of  mitral valve regurgitation. No  evidence of mitral valve stenosis.   Tricuspid Valve: The tricuspid valve is normal in structure. Tricuspid  valve regurgitation is trivial. No evidence of tricuspid stenosis.   Aortic Valve: The Aortic valve is thickened and moderately calcified.  There is mild calcification of the aortic valve. There is mild thickening  of the aortic valve. Aortic valve regurgitation is moderate. Suspect low  flow low gradient moderate aortic  stenosis. Aortic valve mean gradient measures 11.0 mmHg. Aortic valve peak  gradient measures 16.3 mmHg. Aortic valve area, by VTI measures 1.09 cm  and SVI 33.   Pulmonic Valve: The pulmonic valve was not well visualized. Pulmonic valve  regurgitation is not  visualized. No evidence of pulmonic stenosis.   Aorta: The aortic root is normal in size and structure.   Venous: The inferior vena cava is normal in size with greater than 50%  respiratory variability, suggesting right atrial pressure of 3 mmHg.   IAS/Shunts: No atrial level shunt detected by color flow Doppler.   Patient Profile     86 y.o. female with a hx of persistent atrial fibrillation s/p multiple cardioversions, hypertension, OSA on CPAP, CKD, GERD, COPD and celiac disease who was seen 12/23/2021 for the evaluation of chest pain at the request of Dr. Lorin Mercy.  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 Tyrell Antonio, may be reasonable to DC without lasix and close nephrology follow up  Chest Wall pain --High-sensitivity troponin cycled and negative x6.   - EKG without acute ischemic changes.   - Lidoderm patch has  improved sx - not consistent with cardiac etiology   Persistent atrial fibrillation s/p multiple cardioversions SR with 1st HB on therapy -- Continue amiodarone, Toprol XL and diltiazem  -- Continue Eliquis     Hyperlipidemia -- hx of statin intolerance -- On Lipitor 10 mg daily, fenofibrate 54 mg daily   Has 01/02/22 f/u with Dr. Harrell Gave  For questions or updates, please contact [place] Please consult [email or web address] for contact info under     Rudean Haskell, MD [facility]  [address] [place], [place] [address] [phone]  8:56 AM

## 2021-12-27 NOTE — Discharge Summary (Signed)
Physician Discharge Summary   Patient: Amy Hickman MRN: [id number] DOB: 06-Feb-1936  Admit date:     12/22/2021  Discharge date: 12/27/21  Discharge Physician: Elmarie Shiley   PCP: [facility]   Recommendations at discharge:    Close follow up with nephrology to monitor renal function. Lasix and Cozaar on hold at discharge.  Further adjustment of BP medications.   Discharge Diagnoses: Principal Problem:   Chest pain Active Problems:   Atrial fibrillation, chronic (HCC)   Chronic systolic CHF (congestive heart failure) (HCC)   Celiac disease   CKD (chronic kidney disease) stage 4, GFR 15-29 ml/min (HCC)   Hypothyroidism   Essential hypertension   Hyperlipidemia   DNR (do not resuscitate)   Interstitial cystitis   Malnutrition of moderate degree  Resolved Problems:   Chronic diastolic CHF (congestive heart failure) [facility])  Hospital Course: 86 year old with past medical history significant for A-fib status post successful cardioversion on Eliquis, hypertension, CAD, chronic systolic heart failure presents with chest pain.  The chest pain has been on and off for a long time but getting worse.  She has had for cardioversion last 1 May. Present with chest pain, cardiology has been consulted.  Troponin  multiple times negative.   Hospital course extended due to uncontrolled HTN, medications adjustment and subsequent increase cr.   Assessment and Plan:   1-Chest pain: Costochondritis.  Atypical, reports chest pain after she had 2D echo worse with pressure from 2D echo probe. Scheduled Tylenol, lidocaine patch. She is not able to tolerate tramadol or any opioids. I would not want to use NSAIDs due to CKD Echo stable ejection fraction Chest pain improved with lidocaine patch.    CKD stage IV: prior Cr 1.6---1.9 Avoid nephrotoxins.  Continue to hold losartan Monitor cr.  Cr increase to 2, renal US negative for hydronephrosis.  Lasix and  Cozaar discontinue due to increase cr. Looking out patient cr peak up to 2.0 out patient.  Close follow up with nephrology    Chronic A-fib: Continue with Toprol and diltiazem Continue with amiodarone   Chronic Systolic heart failure Hold lasix due to AKI.    Celiac disease: She was seen by GI recently and had a CT with ?colitis -She appears to need colonoscopy for direct visualization as an outpatient -Continue Librax, Nexium (protonix per formulary), hyoscyamine.     Interstitial cystitis Discontinue Elmiron, patient has not been taking it.    Hypertension: Continue with Toprol.  Holding losartan due to CKD. Continue amlodipine and diltiazem./  Hydralazine increase to 50 mg BID. Holder parameter to avoid hypotension.      Estimated body mass index is 27.32 kg/m as calculated from the following:   Height as of this encounter: 5' (1.524 m).   Weight as of this encounter: 63.5 kg.         Consultants: Cardiology  Procedures performed: ECHO Disposition: Home Diet recommendation:  Discharge Diet Orders (From admission, onward)     Start     Ordered   12/27/21 0000  Diet - low sodium heart healthy        12/27/21 0949           Cardiac diet DISCHARGE MEDICATION: Allergies as of 12/27/2021       Reactions   Ativan [lorazepam] Other (See Comments)   "Knocked her out, woke up 3 days later"   Augmentin [amoxicillin-pot Clavulanate] Nausea And Vomiting   Avelox [moxifloxacin] Itching, Nausea Only  Chlorzoxazone Other (See Comments)   Went crazy/loopy   Ciprofloxacin Hcl Itching, Nausea Only   Crestor [rosuvastatin] Other (See Comments)   myalgia   Gluten Meal Other (See Comments)   Diarrhea, hot/cold sweat, will sleep for a couple of hours - Celiac disease   Nitrofuran Derivatives Itching, Nausea Only   Phenergan [promethazine Hcl] Other (See Comments)   hallucinations   Codeine Palpitations   Darvon [propoxyphene Hcl] Palpitations   Doxycycline Itching,  Nausea Only        Medication List     STOP taking these medications    amLODipine 2.5 MG tablet Commonly known as: NORVASC   furosemide 40 MG tablet Commonly known as: LASIX   losartan 25 MG tablet Commonly known as: COZAAR   Netarsudil-Latanoprost 0.02-0.005 % Soln   pentosan polysulfate 100 MG capsule Commonly known as: ELMIRON   Turmeric 500 MG Caps       TAKE these medications    acetaminophen 500 MG tablet Commonly known as: TYLENOL Take 1 tablet (500 mg total) by mouth every 6 (six) hours as needed for pain. What changed:  how much to take reasons to take this   amiodarone 200 MG tablet Commonly known as: PACERONE Take 1 tablet (200 mg total) by mouth daily.   apixaban 2.5 MG Tabs tablet Commonly known as: ELIQUIS Take 1 tablet (2.5 mg total) by mouth 2 (two) times daily.   Artificial Tears 1.4 % ophthalmic solution Generic drug: polyvinyl alcohol Place 1 drop into both eyes as needed for dry eyes.   atorvastatin 10 MG tablet Commonly known as: Lipitor Take 1 tablet (10 mg total) by mouth daily. What changed: when to take this   clidinium-chlordiazePOXIDE 5-2.5 MG capsule Commonly known as: LIBRAX Take 2 capsules by mouth daily as needed (cramping).   CoQ10 100 MG Caps Take 100 mg by mouth every evening.   CRANBERRY-VITAMIN C PO Take 1 capsule by mouth in the morning.   diltiazem 360 MG 24 hr capsule Commonly known as: CARDIZEM CD Take 1 capsule (360 mg total) by mouth daily.   esomeprazole 40 MG capsule Commonly known as: NEXIUM Take 40 mg by mouth daily before breakfast.   fenofibrate 54 MG tablet Take 54 mg by mouth daily.   hydrALAZINE 50 MG tablet Commonly known as: APRESOLINE Take 1 tablet (50 mg total) by mouth 2 (two) times daily.   Hyoscyamine Sulfate SL 0.125 MG Subl Place 0.125 mg under the tongue every 4 (four) hours as needed (cramping).   levothyroxine 75 MCG tablet Commonly known as: SYNTHROID Take 75 mcg by  mouth daily before breakfast.   lidocaine 5 % Commonly known as: LIDODERM Place 1 patch onto the skin daily. Remove & Discard patch within 12 hours or as directed by MD Start taking on: December 28, 2021   metoprolol succinate 100 MG 24 hr tablet Commonly known as: TOPROL-XL Take 1 tablet (100 mg total) by mouth daily. Take with or immediately following a meal. What changed:  when to take this additional instructions   pilocarpine 1 % ophthalmic solution Commonly known as: PILOCAR Place 1 drop into the left eye 2 (two) times daily.   Vitamin B Complex Tabs Take 1 tablet by mouth daily.   Vitamin D (Ergocalciferol) 1.25 MG (50000 UNIT) Caps capsule Commonly known as: DRISDOL Take 50,000 Units by mouth every 7 (seven) days.        Follow-up [facility] Follow  up.   Why: Keep your appointment with your nephrology Contact information: [place] [phone]         Buford Dresser, MD .   Specialty: Cardiology Contact information: [place] [address] [phone]                Discharge Exam: [place] [employer]   12/25/21 0500 12/26/21 0404 12/27/21 0437  Weight: 63.1 kg 63.5 kg 63.9 kg   General; NAD  Condition at discharge: stable  The results of significant diagnostics from this hospitalization (including imaging, microbiology, ancillary and laboratory) are listed below for reference.   Imaging Studies: US RENAL  Result Date: 12/26/2021 CLINICAL DATA:  Acute kidney injury EXAM: RENAL / URINARY TRACT ULTRASOUND COMPLETE COMPARISON:  None Available. FINDINGS: Right Kidney: Renal measurements: 6.8 x 4.0 x 3.5 cm = volume: 50 mL. Echogenicity within normal limits. No mass or hydronephrosis visualized. Left Kidney: Renal measurements: 9.1 x 3.6 x 4.3 cm = volume: 72.3 mL. Echogenicity within normal limits. No mass or hydronephrosis visualized. Bladder:  Appears normal for degree of bladder distention. Other: None. IMPRESSION: No hydronephrosis. Electronically Signed   By: Yetta Glassman M.D.   On: 12/26/2021 09:51   ECHOCARDIOGRAM COMPLETE  Result Date: 12/24/2021    ECHOCARDIOGRAM REPORT   Patient Name:   KADYNCE BONDS Date of Exam: 12/24/2021 Medical Rec #:  [id number]       Height:       60.0 in Accession #:    [id number]      Weight:       139.2 lb Date of Birth:  1935-10-31      BSA:          1.600 m Patient Age:    20 years        BP:           178/80 mmHg Patient Gender: F               HR:           62 bpm. Exam Location:  Inpatient Procedure: 2D Echo, Cardiac Doppler and Color Doppler Indications:    Chest Pain  History:        Patient has prior history of Echocardiogram examinations, most                 recent 03/16/2021. CHF, CAD, Aortic Valve Disease,                 Arrythmias:Atrial Fibrillation; Risk Factors:Hypertension.  Sonographer:    Eartha Inch Referring Phys: [phone] LINDSAY B ROBERTS  Sonographer Comments: Suboptimal apical window. IMPRESSIONS  1. Left ventricular ejection fraction, by estimation, is 40 to 45%. The left ventricle has mildly decreased function. The left ventricle demonstrates global hypokinesis. There is mild concentric left ventricular hypertrophy. Left ventricular diastolic parameters are indeterminate.  2. Right ventricular systolic function is normal. The right ventricular size is normal.  3. Left atrial size was moderately dilated.  4. The mitral valve is normal in structure. No evidence of mitral valve regurgitation. No evidence of mitral stenosis.  5. The Aortic valve is thickened and moderately calcified. . There is mild calcification of the aortic valve. There is mild thickening of the aortic valve. Aortic valve regurgitation is moderate. Suspect low flow low gradient moderate aortic stenosis. Aortic valve area, by VTI measures 1.09 cm. Aortic valve mean gradient measures 11.0 mmHg. Aortic valve Vmax measures  2.02 m/s.  6. The inferior  vena cava is normal in size with greater than 50% respiratory variability, suggesting right atrial pressure of 3 mmHg. Comparison(s): No significant change from prior study. FINDINGS  Left Ventricle: Left ventricular ejection fraction, by estimation, is 40 to 45%. The left ventricle has mildly decreased function. The left ventricle demonstrates global hypokinesis. The left ventricular internal cavity size was normal in size. There is  mild concentric left ventricular hypertrophy. Left ventricular diastolic parameters are indeterminate. Right Ventricle: The right ventricular size is normal. No increase in right ventricular wall thickness. Right ventricular systolic function is normal. Left Atrium: Left atrial size was moderately dilated. Right Atrium: Right atrial size was normal in size. Pericardium: There is no evidence of pericardial effusion. Presence of epicardial fat layer. Mitral Valve: The mitral valve is normal in structure. No evidence of mitral valve regurgitation. No evidence of mitral valve stenosis. Tricuspid Valve: The tricuspid valve is normal in structure. Tricuspid valve regurgitation is trivial. No evidence of tricuspid stenosis. Aortic Valve: The Aortic valve is thickened and moderately calcified. There is mild calcification of the aortic valve. There is mild thickening of the aortic valve. Aortic valve regurgitation is moderate. Suspect low flow low gradient moderate aortic stenosis. Aortic valve mean gradient measures 11.0 mmHg. Aortic valve peak gradient measures 16.3 mmHg. Aortic valve area, by VTI measures 1.09 cm and SVI 33. Pulmonic Valve: The pulmonic valve was not well visualized. Pulmonic valve regurgitation is not visualized. No evidence of pulmonic stenosis. Aorta: The aortic root is normal in size and structure. Venous: The inferior vena cava is normal in size with greater than 50% respiratory variability, suggesting right atrial pressure of 3 mmHg.  IAS/Shunts: No atrial level shunt detected by color flow Doppler.  LEFT VENTRICLE PLAX 2D LVIDd:         4.10 cm     Diastology LVIDs:         3.80 cm     LV e' medial:  3.75 cm/s LV PW:         1.00 cm     LV e' lateral: 4.50 cm/s LV IVS:        1.00 cm LVOT diam:     2.00 cm LV SV:         53 LV SV Index:   33 LVOT Area:     3.14 cm  LV Volumes (MOD) LV vol d, MOD A2C: 86.6 ml LV vol d, MOD A4C: 97.5 ml LV vol s, MOD A2C: 42.4 ml LV vol s, MOD A4C: 50.7 ml LV SV MOD A2C:     44.2 ml LV SV MOD A4C:     97.5 ml LV SV MOD BP:      44.4 ml RIGHT VENTRICLE             IVC RV S prime:     11.50 cm/s  IVC diam: 1.10 cm TAPSE (M-mode): 2.1 cm LEFT ATRIUM             Index        RIGHT ATRIUM           Index LA diam:        3.00 cm 1.87 cm/m   RA Area:     13.40 cm LA Vol (A2C):   60.1 ml 37.56 ml/m  RA Volume:   26.00 ml  16.25 ml/m LA Vol (A4C):   63.6 ml 39.75 ml/m LA Biplane Vol: 68.3 ml 42.68 ml/m  AORTIC VALVE AV Area (Vmax):  0.98 cm AV Area (Vmean):   0.97 cm AV Area (VTI):     1.09 cm AV Vmax:           202.00 cm/s AV Vmean:          160.000 cm/s AV VTI:            0.487 m AV Peak Grad:      16.3 mmHg AV Mean Grad:      11.0 mmHg LVOT Vmax:         63.00 cm/s LVOT Vmean:        49.600 cm/s LVOT VTI:          0.169 m LVOT/AV VTI ratio: 0.35  AORTA Ao Root diam: 3.20 cm  SHUNTS Systemic VTI:  0.17 m Systemic Diam: 2.00 cm Kardie Tobb DO Electronically signed by Berniece Salines DO Signature Date/Time: 12/24/2021/11:03:58 AM    Final    DG Chest 2 View  Result Date: 12/22/2021 CLINICAL DATA:  Chest pain. EXAM: CHEST - 2 VIEW COMPARISON:  Radiograph 03/18/2021 FINDINGS: The cardiomediastinal contours are normal. The lungs are clear. Pulmonary vasculature is normal. No consolidation, pleural effusion, or pneumothorax. Bilateral shoulder arthroplasties. No acute osseous abnormalities are seen. Surgical clips in the upper abdomen. IVC filter is partially visualized. IMPRESSION: No acute chest findings.  Electronically Signed   By: Keith Rake M.D.   On: 12/22/2021 22:31    Microbiology: Results for orders placed or performed during the hospital encounter of 03/15/21  Resp Panel by RT-PCR (Flu A&B, Covid) Nasopharyngeal Swab     Status: Abnormal   Collection Time: 03/15/21  3:49 PM   Specimen: Nasopharyngeal Swab; Nasopharyngeal(NP) swabs in vial transport medium  Result Value Ref Range Status   SARS Coronavirus 2 by RT PCR NEGATIVE NEGATIVE Final    Comment: (NOTE) SARS-CoV-2 target nucleic acids are NOT DETECTED.  The SARS-CoV-2 RNA is generally detectable in upper respiratory specimens during the acute phase of infection. The lowest concentration of SARS-CoV-2 viral copies this assay can detect is 138 copies/mL. A negative result does not preclude SARS-Cov-2 infection and should not be used as the sole basis for treatment or other patient management decisions. A negative result may occur with  improper specimen collection/handling, submission of specimen other than nasopharyngeal swab, presence of viral mutation(s) within the areas targeted by this assay, and inadequate number of viral copies(<138 copies/mL). A negative result must be combined with clinical observations, patient history, and epidemiological information. The expected result is Negative.  Fact Sheet for Patients:  EntrepreneurPulse.com.au  Fact Sheet for Healthcare Providers:  IncredibleEmployment.be  This test is no t yet approved or cleared by the [place] FDA and  has been authorized for detection and/or diagnosis of SARS-CoV-2 by FDA under an Emergency Use Authorization (EUA). This EUA will remain  in effect (meaning this test can be used) for the duration of the COVID-19 declaration under Section 564(b)(1) of the Act, 21 U.S.C.section 360bbb-3(b)(1), unless the authorization is terminated  or revoked sooner.       Influenza A by PCR POSITIVE (A) NEGATIVE  Final   Influenza B by PCR NEGATIVE NEGATIVE Final    Comment: (NOTE) The Xpert Xpress SARS-CoV-2/FLU/RSV plus assay is intended as an aid in the diagnosis of influenza from Nasopharyngeal swab specimens and should not be used as a sole basis for treatment. Nasal washings and aspirates are unacceptable for Xpert Xpress SARS-CoV-2/FLU/RSV testing.  Fact Sheet for Patients: EntrepreneurPulse.com.au  Fact Sheet for Healthcare Providers: IncredibleEmployment.be  This test is not yet approved or cleared by the [place] and has been authorized for detection and/or diagnosis of SARS-CoV-2 by FDA under an Emergency Use Authorization (EUA). This EUA will remain in effect (meaning this test can be used) for the duration of the COVID-19 declaration under Section 564(b)(1) of the Act, 21 U.S.C. section 360bbb-3(b)(1), unless the authorization is terminated or revoked.  Performed at [facility], [address] [place], [place] [address]   Culture, blood (x 2)     Status: None   Collection Time: 03/15/21 11:30 PM   Specimen: BLOOD  Result Value Ref Range Status   Specimen Description   Final    BLOOD RIGHT WRIST Performed at [facility] [address] [place], [place] [address]    Special Requests   Final    BOTTLES DRAWN AEROBIC ONLY Blood Culture adequate volume Performed at [facility] [address], [place], [place] [address]    Culture   Final    NO GROWTH 5 DAYS Performed at [facility], [place] [address] [place], [place] [address]    Report Status 03/21/2021 FINAL  Final  Culture, blood (x 2)     Status: None   Collection Time: 03/15/21 11:30 PM   Specimen: BLOOD  Result Value Ref Range Status   Specimen Description   Final    BLOOD BLOOD LEFT FOREARM Performed at [facility] [address], [place], [place] [address]    Special Requests   Final     BOTTLES DRAWN AEROBIC ONLY Blood Culture adequate volume Performed at [facility] [address] [place], [place] [address]    Culture   Final    NO GROWTH 5 DAYS Performed at [facility], [place] [address], [place], [place] [address]    Report Status 03/21/2021 FINAL  Final  MRSA Next Gen by PCR, Nasal     Status: None   Collection Time: 03/16/21 12:56 AM   Specimen: Urine, Clean Catch; Nasal Swab  Result Value Ref Range Status   MRSA by PCR Next Gen NOT DETECTED NOT DETECTED Final    Comment: (NOTE) The GeneXpert MRSA Assay (FDA approved for NASAL specimens only), is one component of a comprehensive MRSA colonization surveillance program. It is not intended to diagnose MRSA infection nor to guide or monitor treatment for MRSA infections. Test performance is not FDA approved in patients less than 25 years old. Performed at [facility], [address] [place], [place] [address]     Labs: CBC: Recent Labs  Lab 12/22/21 2208 12/24/21 0731  WBC 5.9 6.0  HGB 12.3 12.6  HCT 37.7 39.1  MCV 94.5 94.2  PLT 226 878   Basic Metabolic Panel: Recent Labs  Lab 12/22/21 2208 12/24/21 0731 12/25/21 0350 12/26/21 0554 12/27/21 0307  NA 139 138 137 136 136  K 4.3 3.9 4.0 4.5 4.3  CL 106 103 101 98 100  CO2 25 26 26 28  33*  GLUCOSE 116* 92 104* 102* 102*  BUN 36* 25* 30* 41* 48*  CREATININE 1.76* 1.50* 1.69* 2.09* 2.03*  CALCIUM 9.5 9.7 10.4* 10.7* 10.0   Liver Function Tests: No results for input(s): "AST", "ALT", "ALKPHOS", "BILITOT", "PROT", "ALBUMIN" in the last 168 hours. CBG: No results for input(s): "GLUCAP" in the last 168 hours.  Discharge time spent: greater than 30 minutes.  Signed: Elmarie Shiley, MD [facility] 12/27/2021

## 2021-12-27 NOTE — Progress Notes (Signed)
PIV removed. Discharge instructions completed. Patient verbalized understanding of medication regimen, follow up appointments and discharge instructions. Patient belongings gathered and packed to discharge. Granddaughter in route.

## 2022-01-02 ENCOUNTER — Ambulatory Visit (HOSPITAL_BASED_OUTPATIENT_CLINIC_OR_DEPARTMENT_OTHER): Payer: Medicare Other | Admitting: Cardiology

## 2022-01-05 ENCOUNTER — Encounter (HOSPITAL_BASED_OUTPATIENT_CLINIC_OR_DEPARTMENT_OTHER): Payer: Self-pay | Admitting: Cardiology

## 2022-01-05 ENCOUNTER — Ambulatory Visit (INDEPENDENT_AMBULATORY_CARE_PROVIDER_SITE_OTHER): Payer: Medicare Other | Admitting: Cardiology

## 2022-01-05 VITALS — BP 160/72 | HR 56 | Ht 60.0 in | Wt 147.6 lb

## 2022-01-05 DIAGNOSIS — I480 Paroxysmal atrial fibrillation: Secondary | ICD-10-CM | POA: Diagnosis not present

## 2022-01-05 DIAGNOSIS — I872 Venous insufficiency (chronic) (peripheral): Secondary | ICD-10-CM

## 2022-01-05 DIAGNOSIS — I10 Essential (primary) hypertension: Secondary | ICD-10-CM | POA: Diagnosis not present

## 2022-01-05 DIAGNOSIS — D6869 Other thrombophilia: Secondary | ICD-10-CM | POA: Diagnosis not present

## 2022-01-05 DIAGNOSIS — I2510 Atherosclerotic heart disease of native coronary artery without angina pectoris: Secondary | ICD-10-CM

## 2022-01-05 DIAGNOSIS — I5042 Chronic combined systolic (congestive) and diastolic (congestive) heart failure: Secondary | ICD-10-CM | POA: Diagnosis not present

## 2022-01-05 DIAGNOSIS — I428 Other cardiomyopathies: Secondary | ICD-10-CM

## 2022-01-05 DIAGNOSIS — N1832 Chronic kidney disease, stage 3b: Secondary | ICD-10-CM

## 2022-01-05 DIAGNOSIS — Z7901 Long term (current) use of anticoagulants: Secondary | ICD-10-CM

## 2022-01-05 MED ORDER — HYDRALAZINE HCL 50 MG PO TABS: 50.0000 mg | ORAL_TABLET | Freq: Three times a day (TID) | ORAL | 3 refills | Status: DC

## 2022-01-05 NOTE — Progress Notes (Signed)
Cardiology Office Note:    Date:  01/05/2022   ID:  Amy Hickman, DOB 1935-10-31, MRN [id number]  PCP:  [facility]  Cardiologist:  Buford Dresser, MD  Referring MD: [facility]*   CC: follow up  History of Present Illness:    Amy Hickman is a 86 y.o. female with a hx of persistent atrial fibrillation, hypertension, OSA on CPAP, CAD, CKD, GERD, COPD, and celiac disease who is seen for follow up today. I met her during her admission 03/17/21 for the evaluation and management of atrial fibrillation.  History: Admission 03/15/21 for sepsis in the setting of influenza A, found to have afib RVR. EF slightly reduced. Difficult to rate control, required both metoprolol and diltiazem. Cardioversion 04/24/2021 got her into sinus rhythm, but she then returned to atrial fibrillation. Started on amiodarone, repeat DCCV 06/12/21 with restoration of sinus rhythm.   She presented to the ED on 12/22/2021 for left chest pain. She was experiencing chest pain when attempting to breathe hard. This pain has become worse in the last week. She took 6 NTG with transient improvement before she got hot and nauseated. Troponin was negatve x2. Chest X-ray negative. Admission to the hospital was recommended by cardiology for further work up.   Today: She is accompanied by her daughter. She overall is doing well. They enjoyed their trip to [place].   She complains of poor left kidney function at 20%. She is currently dealing with a UTI as well. She is making good urine throughout the day and has several episodes of nocturia each night. She has been balancing staying hydrated with avoiding volume overload. She recently saw [facility], who added imdur and advised follow up in 3 mos.  Her blood pressure was elevated today in clinic. Upon recheck, it is higher than initial reading. She has not been taking her blood pressure at home.   In regards to her diet,  she is having home cooked meals by her niece, who is living with her. She is advised to have at least two meals per day for her kidney, according to the nephrologist.   She denies any palpitations, chest pain, shortness of breath, or peripheral edema. No lightheadedness, headaches, syncope, orthopnea, or PND.  Past Medical History:  Diagnosis Date   Atrial fibrillation, chronic (HCC)    s/p multiple cardioversions with sustained NSR, on Eliquis   Celiac disease    Chronic systolic CHF (congestive heart failure) (HCC)    GERD (gastroesophageal reflux disease)    HOH (hard of hearing)    Hypertension    Interstitial cystitis     Past Surgical History:  Procedure Laterality Date   ABDOMINAL HYSTERECTOMY     APPENDECTOMY     CARDIOVERSION N/A 04/24/2021   Procedure: CARDIOVERSION;  Surgeon: Jerline Pain, MD;  Location: [place];  Service: Cardiovascular;  Laterality: N/A;   CARDIOVERSION N/A 06/12/2021   Procedure: CARDIOVERSION;  Surgeon: Buford Dresser, MD;  Location: [place];  Service: Cardiovascular;  Laterality: N/A;   CARDIOVERSION N/A 08/11/2021   Procedure: CARDIOVERSION;  Surgeon: [name], MD;  Location: [facility] ENDOSCOPY;  Service: Cardiovascular;  Laterality: N/A;   CHOLECYSTECTOMY     ear drum surgery     REPLACEMENT TOTAL KNEE     ROTATOR CUFF REPAIR Right    TONSILLECTOMY      Current Medications: Current Outpatient Medications on File Prior to Visit  Medication Sig   acetaminophen (TYLENOL) 500  MG tablet Take 1 tablet (500 mg total) by mouth every 6 (six) hours as needed for pain. (Patient taking differently: Take 1,000 mg by mouth every 6 (six) hours as needed for pain or moderate pain.)   amiodarone (PACERONE) 200 MG tablet Take 1 tablet (200 mg total) by mouth daily.   apixaban (ELIQUIS) 2.5 MG TABS tablet Take 1 tablet (2.5 mg total) by mouth 2 (two) times daily.   atorvastatin (LIPITOR) 10 MG tablet Take 1 tablet (10 mg total) by mouth  daily. (Patient taking differently: Take 10 mg by mouth every evening.)   B Complex Vitamins (VITAMIN B COMPLEX) TABS Take 1 tablet by mouth daily.   cefdinir (OMNICEF) 300 MG capsule Take by mouth.   clidinium-chlordiazePOXIDE (LIBRAX) 5-2.5 MG capsule Take 2 capsules by mouth daily as needed (cramping).   Coenzyme Q10 (COQ10) 100 MG CAPS Take 100 mg by mouth every evening.   CRANBERRY-VITAMIN C PO Take 1 capsule by mouth in the morning.   diltiazem (CARDIZEM CD) 360 MG 24 hr capsule Take 1 capsule (360 mg total) by mouth daily.   esomeprazole (NEXIUM) 40 MG capsule Take 40 mg by mouth daily before breakfast.   fenofibrate 54 MG tablet Take 54 mg by mouth daily.   Hyoscyamine Sulfate SL 0.125 MG SUBL Place 0.125 mg under the tongue every 4 (four) hours as needed (cramping).   isosorbide dinitrate (ISORDIL) 10 MG tablet Take 10 mg by mouth 2 (two) times daily.   levothyroxine (SYNTHROID) 75 MCG tablet Take 75 mcg by mouth daily before breakfast.   lidocaine (LIDODERM) 5 % Place 1 patch onto the skin daily. Remove & Discard patch within 12 hours or as directed by MD   metoprolol (TOPROL-XL) 100 MG 24 hr tablet Take 1 tablet (100 mg total) by mouth daily. Take with or immediately following a meal. (Patient taking differently: Take 100 mg by mouth every evening.)   Netarsudil-Latanoprost (ROCKLATAN) 0.02-0.005 % SOLN Apply to eye.   pilocarpine (PILOCAR) 1 % ophthalmic solution Place 1 drop into the left eye 2 (two) times daily.   polyvinyl alcohol (ARTIFICIAL TEARS) 1.4 % ophthalmic solution Place 1 drop into both eyes as needed for dry eyes.   Vitamin D, Ergocalciferol, (DRISDOL) 1.25 MG (50000 UNIT) CAPS capsule Take 50,000 Units by mouth every 7 (seven) days.   No current facility-administered medications on file prior to visit.     Allergies:   Ativan [lorazepam], Augmentin [amoxicillin-pot clavulanate], Avelox [moxifloxacin], Brimonidine, Chlorzoxazone, Ciprofloxacin hcl, Clavulanic acid,  Crestor [rosuvastatin], Ezetimibe, Gluten meal, Nitrofuran derivatives, Phenergan [promethazine hcl], Wheat bran, Codeine, Darvon [propoxyphene hcl], and Doxycycline   Social History   Tobacco Use   Smoking status: Former    Packs/day: 2.00    Years: 33.00    Total pack years: 66.00    Types: Cigarettes    Quit date: 1981    Years since quitting: 42.7   Smokeless tobacco: Never  Substance Use Topics   Alcohol use: Not Currently    Comment: rare   Drug use: No    Family History: family history includes Hypertension in an other family member.  ROS:   Please see the history of present illness. All other systems are reviewed and negative.    EKGs/Labs/Other Studies Reviewed:    The following studies were reviewed today:  Echo 12/24/2021: 1. Left ventricular ejection fraction, by estimation, is 40 to 45%. The  left ventricle has mildly decreased function. The left ventricle  demonstrates global hypokinesis. There is  mild concentric left ventricular  hypertrophy. Left ventricular diastolic  parameters are indeterminate.   2. Right ventricular systolic function is normal. The right ventricular  size is normal.   3. Left atrial size was moderately dilated.   4. The mitral valve is normal in structure. No evidence of mitral valve  regurgitation. No evidence of mitral stenosis.   5. The Aortic valve is thickened and moderately calcified. . There is  mild calcification of the aortic valve. There is mild thickening of the  aortic valve. Aortic valve regurgitation is moderate. Suspect low flow low  gradient moderate aortic stenosis.  Aortic valve area, by VTI measures 1.09 cm. Aortic valve mean gradient  measures 11.0 mmHg. Aortic valve Vmax measures 2.02 m/s.   6. The inferior vena cava is normal in size with greater than 50%  respiratory variability, suggesting right atrial pressure of 3 mmHg.   Comparison(s): No significant change from prior study.    CT Chest 05/18/2021  ([place]): COMPARISON:  04/02/2021   FINDINGS:  Cardiovascular: Heart top-normal in size. Three-vessel coronary  artery calcifications. No pericardial effusion. Aortic  atherosclerosis.   Mediastinum/Nodes: No neck base, mediastinal or hilar masses or  enlarged lymph nodes. Trachea and esophagus are unremarkable.   Lungs/Pleura: No evidence of pulmonary contusion or laceration. No  no lung consolidation to suggest pneumonia and no evidence of  pulmonary edema. Several reticulonodular type opacities, most  evident in the anterior right middle lobe, are stable, benign. There  are no masses or suspicious nodules. Mild paraseptal emphysema is  noted posteriorly, also unchanged.   No pleural effusion or pneumothorax.   Upper Abdomen: No acute abnormality.   Musculoskeletal: No fracture or acute finding. Bilateral  well-positioned shoulder prostheses. No bone lesions. No chest wall  mass or contusion.   IMPRESSION:  1. No acute findings or evidence of injury to the chest. No  fractures.  2. Aortic atherosclerosis and coronary artery calcifications.   Aortic Atherosclerosis (ICD10-I70.0) and Emphysema (ICD10-J43.9).   CT Chest 04/02/2021 ([place]): COMPARISON:  Chest x-rays from November of 2022.   FINDINGS:  Cardiovascular: Calcified atherosclerosis of the thoracic aorta.  Calcified coronary artery disease, three-vessel coronary artery  disease. No aortic dilation. Heart size moderately enlarged, no  substantial pericardial effusion. Central pulmonary vasculature is  normal caliber. Limited assessment of cardiovascular structures  given lack of intravenous contrast.   Mediastinum/Nodes: Patulous esophagus. No thoracic inlet or axillary  adenopathy though streak artifact from bilateral reverse shoulder  arthroplasties limits assessment in the axillary regions and at the  thoracic inlet. No mediastinal or hilar adenopathy.   Lungs/Pleura:  Basilar atelectasis at the LEFT lung base. No  effusion. No consolidation. 3 mm RIGHT middle lobe pulmonary nodule  (image 101/3). Airways are patent.   Upper Abdomen: Postoperative changes about the GE junction with  surgical clips in this area. No acute findings relative to  visualized liver, pancreas, spleen or very limited visualization of  other upper abdominal structures.   Musculoskeletal: Post bilateral reverse shoulder arthroplasty. No  acute musculoskeletal process. No destructive bone finding. Spinal  degenerative changes.   IMPRESSION:  No acute findings in the chest.   3 mm RIGHT middle lobe pulmonary nodule. No follow-up needed if  patient is low-risk. Non-contrast chest CT can be considered in 12  months if patient is high-risk. This recommendation follows the  consensus statement: Guidelines for Management of Incidental  Pulmonary Nodules Detected on CT Images: From the Fleischner  Society  2017; Radiology 2017; [phone].   Three-vessel coronary artery disease.   Postoperative changes about the GE junction.   Aortic atherosclerosis.   Echo 03/16/21 1. Left ventricular ejection fraction, by estimation, is 40 to 45%. The  left ventricle has mildly decreased function. The left ventricle  demonstrates global hypokinesis. Left ventricular diastolic parameters are  indeterminate.   2. Right ventricular systolic function is mildly reduced. The right  ventricular size is normal. There is moderately elevated pulmonary artery  systolic pressure. The estimated right ventricular systolic pressure is  59.4 mmHg.   3. Left atrial size was moderately dilated.   4. Right atrial size was severely dilated.   5. The mitral valve is normal in structure. Mild to moderate mitral valve  regurgitation.   6. Tricuspid valve regurgitation is moderate.   7. The aortic valve is tricuspid. There is severe calcifcation of the  aortic valve. Aortic valve regurgitation is mild. Moderate  aortic valve  stenosis. Gradients consistent with mild AS (MG 21mHg, Vmax 2.2 m/s), but moderate by AVA (1.0cm^2) and DI  (0.32). Low SV index, suspect low flow low gradient moderate AS   8. The inferior vena cava is dilated in size with <50% respiratory  variability, suggesting right atrial pressure of 15 mmHg.   EKG:  EKG is personally reviewed.   01/05/22: Sinus bradycardia. Rate 56 bpm. 05/30/2021: atrial flutter at 77 bpm 05/09/2021: atrial fibrillation at 83 bpm 04/08/21: atrial fibrillation at 86 bpm  Recent Labs: 03/16/2021: ALT 22; Magnesium 1.9; TSH 0.589 03/18/2021: B Natriuretic Peptide 653.4 12/24/2021: Hemoglobin 12.6; Platelets 222 12/27/2021: BUN 48; Creatinine, Ser 2.03; Potassium 4.3; Sodium 136   Recent Lipid Panel No results found for: "CHOL", "TRIG", "HDL", "CHOLHDL", "VLDL", "LDLCALC", "LDLDIRECT"  Physical Exam:    VS:  BP (!) 160/72 (BP Location: Left Arm, Patient Position: Sitting, Cuff Size: Normal)   Pulse (!) 56   Ht 5' (1.524 m)   Wt 147 lb 9.6 oz (67 kg)   SpO2 97%   BMI 28.83 kg/m     Wt Readings from Last 3 Encounters:  01/05/22 147 lb 9.6 oz (67 kg)  12/27/21 140 lb 14.4 oz (63.9 kg)  08/21/21 140 lb 6.4 oz (63.7 kg)    GEN: Well nourished, well developed in no acute distress HEENT: Normal, moist mucous membranes NECK: No JVD CARDIAC: regular rhythm, normal S1 and S2, no rubs or gallops. 2/6 systolic murmur. VASCULAR: Radial and DP pulses 2+ bilaterally. No carotid bruits RESPIRATORY:  Clear to auscultation without rales, wheezing or rhonchi  ABDOMEN: Soft, non-tender, non-distended MUSCULOSKELETAL:  Ambulates independently SKIN: Warm and dry, trivial LE edema NEUROLOGIC:  Alert and oriented x 3. No focal neuro deficits noted. PSYCHIATRIC:  Normal affect    ASSESSMENT:    1. Essential hypertension   2. Chronic combined systolic and diastolic heart failure (HCC)   3. Paroxysmal atrial fibrillation ([place]   4. Secondary hypercoagulable  state ([place]   5. Long term current use of anticoagulant   6. Venous insufficiency   7. NICM (nonischemic cardiomyopathy) ([place]   8. Stage 3b chronic kidney disease (HCC)     PLAN:    Paroxysmal atrial fibrillation/atrial flutter Chronic systolic and diastolic heart failure Nonischemic cardiomyopathy -EF 40-45%. Not significantly changed with holding sinus rhythm -Cath with nonobstructive CAD -consolidated metoprolol to succinate at max dose -would prefer not to use diltiazem given low EF, but was not rate controlled on metoprolol alone. Will continue diltiazem for now. -CHA2DS2/VAS Stroke  Risk Points=8  -continue apixaban 5 mg BID -tolerating amiodarone  Chronic kidney disease, stage 3b vs stage 4 Hypertension -ARB held during recent hospitalization -on hydralazine 50 mg BID. BP elevated. Will trial 50 mg TID -imdur added by nephrology, continue -followed by nephrology  OSA: continue CPAP  Moderate aortic stenosis Mild-moderate MR -last echo 12/2021, follow annually or for change in symptoms  Bilateral LE edema, R >L: only trivial today -has history of vein stripping as well as bilateral knee arthroplasty, predisposing her to venous insufficiency -she is otherwise euvolemic on exam today -previously discussed venous insufficiency, compression stockings, elevation  CAD, nonobstructive, without angina -given age, continue apixaban and no aspirin -continue atorvastatin  Plan for follow up: 3-4 weeks.  Time Spent with Patient: I have spent a total of 43 minutes in patient care, including reviewing hospital notes, telemetry, EKGs, labs; discussing with the team; examining the patient; and establishing an assessment and plan that was discussed with the patient.  > 50% of time was spent in direct patient care.    Medication Adjustments/Labs and Tests Ordered: Current medicines are reviewed at length with the patient today.  Concerns regarding medicines are outlined above.    Orders Placed This Encounter  Procedures   EKG 12-Lead   Meds ordered this encounter  Medications   hydrALAZINE (APRESOLINE) 50 MG tablet    Sig: Take 1 tablet (50 mg total) by mouth 3 (three) times daily.    Dispense:  90 tablet    Refill:  3   Patient Instructions  Medication Instructions:  INCREASE: Hydralazine to 50 mg three time a day   *If you need a refill on your cardiac medications before your next appointment, please call your pharmacy*   Lab Work: None ordered today   Testing/Procedures: None ordered today   Follow-Up: At [facility], you and your health needs are our priority.  As part of our continuing mission to provide you with exceptional heart care, we have created designated Provider Care Teams.  These Care Teams include your primary Cardiologist (physician) and Advanced Practice Providers (APPs -  Physician Assistants and Nurse Practitioners) who all work together to provide you with the care you need, when you need it.  We recommend signing up for the patient portal called "MyChart".  Sign up information is provided on this After Visit Summary.  MyChart is used to connect with patients for Virtual Visits (Telemedicine).  Patients are able to view lab/test results, encounter notes, upcoming appointments, etc.  Non-urgent messages can be sent to your provider as well.   To learn more about what you can do with MyChart, go to [email or web address].    Your next appointment:   3-4 week(s)  The format for your next appointment:   In Person  Provider:   Buford Dresser, MD             I,Breanna Adamick,acting as a scribe for Buford Dresser, MD.,have documented all relevant documentation on the behalf of Buford Dresser, MD,as directed by  Buford Dresser, MD while in the presence of Buford Dresser, MD.    I, Buford Dresser, MD, have reviewed all documentation for this visit. The documentation on  01/05/22 for the exam, diagnosis, procedures, and orders are all accurate and complete.   Signed, Buford Dresser, MD PhD 01/05/2022    [place]

## 2022-01-05 NOTE — Patient Instructions (Signed)
Medication Instructions:  INCREASE: Hydralazine to 50 mg three time a day   *If you need a refill on your cardiac medications before your next appointment, please call your pharmacy*   Lab Work: None ordered today   Testing/Procedures: None ordered today   Follow-Up: At [facility], you and your health needs are our priority.  As part of our continuing mission to provide you with exceptional heart care, we have created designated Provider Care Teams.  These Care Teams include your primary Cardiologist (physician) and Advanced Practice Providers (APPs -  Physician Assistants and Nurse Practitioners) who all work together to provide you with the care you need, when you need it.  We recommend signing up for the patient portal called "MyChart".  Sign up information is provided on this After Visit Summary.  MyChart is used to connect with patients for Virtual Visits (Telemedicine).  Patients are able to view lab/test results, encounter notes, upcoming appointments, etc.  Non-urgent messages can be sent to your provider as well.   To learn more about what you can do with MyChart, go to [email or web address].    Your next appointment:   3-4 week(s)  The format for your next appointment:   In Person  Provider:   Buford Dresser, MD

## 2022-02-04 ENCOUNTER — Ambulatory Visit (HOSPITAL_BASED_OUTPATIENT_CLINIC_OR_DEPARTMENT_OTHER): Payer: Medicare Other | Admitting: Cardiology

## 2022-02-05 DIAGNOSIS — R7989 Other specified abnormal findings of blood chemistry: Secondary | ICD-10-CM | POA: Insufficient documentation

## 2022-02-20 ENCOUNTER — Encounter (HOSPITAL_BASED_OUTPATIENT_CLINIC_OR_DEPARTMENT_OTHER): Payer: Self-pay | Admitting: Cardiology

## 2022-02-20 ENCOUNTER — Ambulatory Visit (INDEPENDENT_AMBULATORY_CARE_PROVIDER_SITE_OTHER): Payer: Medicare Other | Admitting: Cardiology

## 2022-02-20 VITALS — BP 152/78 | HR 56 | Ht 60.0 in | Wt 151.9 lb

## 2022-02-20 DIAGNOSIS — I480 Paroxysmal atrial fibrillation: Secondary | ICD-10-CM

## 2022-02-20 DIAGNOSIS — I2510 Atherosclerotic heart disease of native coronary artery without angina pectoris: Secondary | ICD-10-CM | POA: Diagnosis not present

## 2022-02-20 DIAGNOSIS — Z7901 Long term (current) use of anticoagulants: Secondary | ICD-10-CM | POA: Diagnosis not present

## 2022-02-20 DIAGNOSIS — D6869 Other thrombophilia: Secondary | ICD-10-CM | POA: Diagnosis not present

## 2022-02-20 DIAGNOSIS — I10 Essential (primary) hypertension: Secondary | ICD-10-CM

## 2022-02-20 DIAGNOSIS — I428 Other cardiomyopathies: Secondary | ICD-10-CM | POA: Diagnosis not present

## 2022-02-20 DIAGNOSIS — N1832 Chronic kidney disease, stage 3b: Secondary | ICD-10-CM

## 2022-02-20 NOTE — Patient Instructions (Signed)
Medication Instructions:  Your Physician recommend you continue on your current medication as directed.    *If you need a refill on your cardiac medications before your next appointment, please call your pharmacy*   Lab Work: None ordered today   Testing/Procedures: None ordered today   Follow-Up: At [facility], you and your health needs are our priority.  As part of our continuing mission to provide you with exceptional heart care, we have created designated Provider Care Teams.  These Care Teams include your primary Cardiologist (physician) and Advanced Practice Providers (APPs -  Physician Assistants and Nurse Practitioners) who all work together to provide you with the care you need, when you need it.  We recommend signing up for the patient portal called "MyChart".  Sign up information is provided on this After Visit Summary.  MyChart is used to connect with patients for Virtual Visits (Telemedicine).  Patients are able to view lab/test results, encounter notes, upcoming appointments, etc.  Non-urgent messages can be sent to your provider as well.   To learn more about what you can do with MyChart, go to [email or web address].    Your next appointment:   3 month(s)  The format for your next appointment:   In Person  Provider:   Buford Dresser, MD

## 2022-02-20 NOTE — Progress Notes (Signed)
Cardiology Office Note:    Date:  02/20/2022   ID:  Amy Hickman, DOB [birth year], MRN [id number]  PCP:  [facility]  Cardiologist:  Buford Dresser, MD  Referring MD: [facility]*   CC: follow up  History of Present Illness:    Amy Hickman is a 86 y.o. female with a hx of persistent atrial fibrillation, hypertension, OSA on CPAP, CAD, CKD, GERD, COPD, and celiac disease who is seen for follow up today. I met her during her admission 03/17/21 for the evaluation and management of atrial fibrillation.  History: Admission 03/15/21 for sepsis in the setting of influenza A, found to have afib RVR. EF slightly reduced. Difficult to rate control, required both metoprolol and diltiazem. Cardioversion 04/24/2021 got her into sinus rhythm, but she then returned to atrial fibrillation. Started on amiodarone, repeat DCCV 06/12/21 with restoration of sinus rhythm.   She presented to the ED on 12/22/2021 for left chest pain. She was experiencing chest pain when attempting to breathe hard. This pain has become worse in the last week. She took 6 NTG with transient improvement before she got hot and nauseated. Troponin was negatve x2. Chest X-ray negative. Admission to the hospital was recommended by cardiology for further work up.   At her last visit with me on 01/05/22, she was doing well overall. She was currently being treated for a UTI. She had not been checking her BP at home and had elevated blood pressures in clinic that day. Her Hydralazine 51m was increased to TID use at that time.  Today, she is accompanied by her daughter. She states that she has been dealing with an URI and is concerned that she may have gone back into A-fib. She reports mild shortness of breath, cough, and fatigue x2 weeks.  She is taking her Hydralazine TID at least 4 days a week and BID the rest of the week. She feels that he BP is sometimes low. She reports that her home  BP is typically between 1734-193systolic.  Her Synthroid was recently increased. She has not yet felt a difference in her fatigue with this change.  [facility]. She had an unremarkable abdominal [place] 02/17/22. She is scheduled to see her PCP for follow up later this month.  The patient denies chest pain, chest pressure, PND, orthopnea, or leg swelling. Denies fever, chills, nausea, or vomiting. Denies syncope. Denies dizziness or lightheadedness.  Past Medical History:  Diagnosis Date   Atrial fibrillation, chronic (HCC)    s/p multiple cardioversions with sustained NSR, on Eliquis   Celiac disease    Chronic systolic CHF (congestive heart failure) (HCC)    GERD (gastroesophageal reflux disease)    HOH (hard of hearing)    Hypertension    Interstitial cystitis     Past Surgical History:  Procedure Laterality Date   ABDOMINAL HYSTERECTOMY     APPENDECTOMY     CARDIOVERSION N/A 04/24/2021   Procedure: CARDIOVERSION;  Surgeon: SJerline Pain MD;  [place]  Service: Cardiovascular;  Laterality: N/A;   CARDIOVERSION N/A 06/12/2021   Procedure: CARDIOVERSION;  Surgeon: CBuford Dresser MD;  [place]  Service: Cardiovascular;  Laterality: N/A;   CARDIOVERSION N/A 08/11/2021   Procedure: CARDIOVERSION;  Surgeon: PFreada Bergeron MD;  [facility]  Service: Cardiovascular;  Laterality: N/A;   CHOLECYSTECTOMY     ear drum surgery  REPLACEMENT TOTAL KNEE     ROTATOR CUFF REPAIR Right    TONSILLECTOMY      Current Medications: Current Outpatient Medications on File Prior to Visit  Medication Sig   acetaminophen (TYLENOL) 500 MG tablet Take 1 tablet (500 mg total) by mouth every 6 (six) hours as needed for pain. (Patient taking differently: Take 1,000 mg by mouth every 6 (six) hours as needed for pain or moderate pain.)   amiodarone (PACERONE) 200 MG tablet Take 1 tablet (200 mg total)  by mouth daily.   apixaban (ELIQUIS) 2.5 MG TABS tablet Take 1 tablet (2.5 mg total) by mouth 2 (two) times daily.   B Complex Vitamins (VITAMIN B COMPLEX) TABS Take 1 tablet by mouth daily.   cefdinir (OMNICEF) 300 MG capsule Take by mouth.   clidinium-chlordiazePOXIDE (LIBRAX) 5-2.5 MG capsule Take 2 capsules by mouth daily as needed (cramping).   CRANBERRY-VITAMIN C PO Take 1 capsule by mouth in the morning.   diltiazem (CARDIZEM CD) 360 MG 24 hr capsule Take 1 capsule (360 mg total) by mouth daily.   esomeprazole (NEXIUM) 40 MG capsule Take 40 mg by mouth daily before breakfast.   hydrALAZINE (APRESOLINE) 50 MG tablet Take 1 tablet (50 mg total) by mouth 3 (three) times daily.   Hyoscyamine Sulfate SL 0.125 MG SUBL Place 0.125 mg under the tongue every 4 (four) hours as needed (cramping).   isosorbide dinitrate (ISORDIL) 10 MG tablet Take 10 mg by mouth 2 (two) times daily.   levothyroxine (SYNTHROID) 100 MCG tablet Take 100 mcg by mouth daily before breakfast.   metoprolol (TOPROL-XL) 100 MG 24 hr tablet Take 1 tablet (100 mg total) by mouth daily. Take with or immediately following a meal. (Patient taking differently: Take 100 mg by mouth every evening.)   Netarsudil-Latanoprost (ROCKLATAN) 0.02-0.005 % SOLN Apply to eye.   pilocarpine (PILOCAR) 1 % ophthalmic solution Place 1 drop into the left eye 2 (two) times daily.   polyvinyl alcohol (ARTIFICIAL TEARS) 1.4 % ophthalmic solution Place 1 drop into both eyes as needed for dry eyes.   Vitamin D, Ergocalciferol, (DRISDOL) 1.25 MG (50000 UNIT) CAPS capsule Take 50,000 Units by mouth every 7 (seven) days.   No current facility-administered medications on file prior to visit.     Allergies:   Ativan [lorazepam], Augmentin [amoxicillin-pot clavulanate], Avelox [moxifloxacin], Brimonidine, Chlorzoxazone, Ciprofloxacin hcl, Clavulanic acid, Crestor [rosuvastatin], Ezetimibe, Gluten meal, Nitrofuran derivatives, Phenergan [promethazine hcl],  Wheat bran, Codeine, Darvon [propoxyphene hcl], and Doxycycline   Social History   Tobacco Use   Smoking status: Former    Packs/day: 2.00    Years: 33.00    Total pack years: 66.00    Types: Cigarettes    Quit date: 1981    Years since quitting: 42.8   Smokeless tobacco: Never  Substance Use Topics   Alcohol use: Not Currently    Comment: rare   Drug use: No    Family History: family history includes Hypertension in an other family member.  ROS:   Please see the history of present illness. (+) Cough (+) Shortness of breath (+) Fatigue All other systems are reviewed and negative.    EKGs/Labs/Other Studies Reviewed:    The following studies were reviewed today:  Abdominal US 02/17/22: Liver:  No focal lesion identified. Within normal limits in parenchymal  echogenicity. Portal vein is patent on color Doppler imaging with  normal direction of blood flow towards the liver.  IMPRESSION:  1. The common bile duct measures  9 mm which is normal for the  patient's age and status post cholecystectomy.  2. No other abnormalities.   Echo 12/24/2021: 1. Left ventricular ejection fraction, by estimation, is 40 to 45%. The  left ventricle has mildly decreased function. The left ventricle  demonstrates global hypokinesis. There is mild concentric left ventricular  hypertrophy. Left ventricular diastolic  parameters are indeterminate.   2. Right ventricular systolic function is normal. The right ventricular  size is normal.   3. Left atrial size was moderately dilated.   4. The mitral valve is normal in structure. No evidence of mitral valve  regurgitation. No evidence of mitral stenosis.   5. The Aortic valve is thickened and moderately calcified. . There is  mild calcification of the aortic valve. There is mild thickening of the  aortic valve. Aortic valve regurgitation is moderate. Suspect low flow low  gradient moderate aortic stenosis.  Aortic valve area, by VTI measures  1.09 cm. Aortic valve mean gradient  measures 11.0 mmHg. Aortic valve Vmax measures 2.02 m/s.   6. The inferior vena cava is normal in size with greater than 50%  respiratory variability, suggesting right atrial pressure of 3 mmHg.   Comparison(s): No significant change from prior study.    CT Chest 05/18/2021 ([place]): COMPARISON:  04/02/2021   FINDINGS:  Cardiovascular: Heart top-normal in size. Three-vessel coronary  artery calcifications. No pericardial effusion. Aortic  atherosclerosis.   Mediastinum/Nodes: No neck base, mediastinal or hilar masses or  enlarged lymph nodes. Trachea and esophagus are unremarkable.   Lungs/Pleura: No evidence of pulmonary contusion or laceration. No  no lung consolidation to suggest pneumonia and no evidence of  pulmonary edema. Several reticulonodular type opacities, most  evident in the anterior right middle lobe, are stable, benign. There  are no masses or suspicious nodules. Mild paraseptal emphysema is  noted posteriorly, also unchanged.   No pleural effusion or pneumothorax.   Upper Abdomen: No acute abnormality.   Musculoskeletal: No fracture or acute finding. Bilateral  well-positioned shoulder prostheses. No bone lesions. No chest wall  mass or contusion.   IMPRESSION:  1. No acute findings or evidence of injury to the chest. No  fractures.  2. Aortic atherosclerosis and coronary artery calcifications.   Aortic Atherosclerosis (ICD10-I70.0) and Emphysema (ICD10-J43.9).   CT Chest 04/02/2021 ([place]): COMPARISON:  Chest x-rays from November of 2022.   FINDINGS:  Cardiovascular: Calcified atherosclerosis of the thoracic aorta.  Calcified coronary artery disease, three-vessel coronary artery  disease. No aortic dilation. Heart size moderately enlarged, no  substantial pericardial effusion. Central pulmonary vasculature is  normal caliber. Limited assessment of cardiovascular structures   given lack of intravenous contrast.   Mediastinum/Nodes: Patulous esophagus. No thoracic inlet or axillary  adenopathy though streak artifact from bilateral reverse shoulder  arthroplasties limits assessment in the axillary regions and at the  thoracic inlet. No mediastinal or hilar adenopathy.   Lungs/Pleura: Basilar atelectasis at the LEFT lung base. No  effusion. No consolidation. 3 mm RIGHT middle lobe pulmonary nodule  (image 101/3). Airways are patent.   Upper Abdomen: Postoperative changes about the GE junction with  surgical clips in this area. No acute findings relative to  visualized liver, pancreas, spleen or very limited visualization of  other upper abdominal structures.   Musculoskeletal: Post bilateral reverse shoulder arthroplasty. No  acute musculoskeletal process. No destructive bone finding. Spinal  degenerative changes.   IMPRESSION:  No acute findings in the chest.  3 mm RIGHT middle lobe pulmonary nodule. No follow-up needed if  patient is low-risk. Non-contrast chest CT can be considered in 12  months if patient is high-risk. This recommendation follows the  consensus statement: Guidelines for Management of Incidental  Pulmonary Nodules Detected on CT Images: From the Fleischner Society  2017; Radiology 2017; 284:228-243.   Three-vessel coronary artery disease.   Postoperative changes about the GE junction.   Aortic atherosclerosis.   Echo 03/16/21 1. Left ventricular ejection fraction, by estimation, is 40 to 45%. The  left ventricle has mildly decreased function. The left ventricle  demonstrates global hypokinesis. Left ventricular diastolic parameters are  indeterminate.   2. Right ventricular systolic function is mildly reduced. The right  ventricular size is normal. There is moderately elevated pulmonary artery  systolic pressure. The estimated right ventricular systolic pressure is  67.3 mmHg.   3. Left atrial size was moderately dilated.    4. Right atrial size was severely dilated.   5. The mitral valve is normal in structure. Mild to moderate mitral valve  regurgitation.   6. Tricuspid valve regurgitation is moderate.   7. The aortic valve is tricuspid. There is severe calcifcation of the  aortic valve. Aortic valve regurgitation is mild. Moderate aortic valve  stenosis. Gradients consistent with mild AS (MG 58mHg, Vmax 2.2 m/s), but moderate by AVA (1.0cm^2) and DI  (0.32). Low SV index, suspect low flow low gradient moderate AS   8. The inferior vena cava is dilated in size with <50% respiratory  variability, suggesting right atrial pressure of 15 mmHg.   EKG:  EKG is personally reviewed.   02/20/22: sinus bradycardia at 56 bpm 01/05/22: Sinus bradycardia. Rate 56 bpm. 05/30/2021: atrial flutter at 77 bpm 05/09/2021: atrial fibrillation at 83 bpm 04/08/21: atrial fibrillation at 86 bpm  Recent Labs: 03/16/2021: ALT 22; Magnesium 1.9; TSH 0.589 03/18/2021: B Natriuretic Peptide 653.4 12/24/2021: Hemoglobin 12.6; Platelets 222 12/27/2021: BUN 48; Creatinine, Ser 2.03; Potassium 4.3; Sodium 136   Recent Lipid Panel No results found for: "CHOL", "TRIG", "HDL", "CHOLHDL", "VLDL", "LDLCALC", "LDLDIRECT"  Physical Exam:    VS:  BP (!) 152/78 (BP Location: Left Arm, Patient Position: Sitting, Cuff Size: Normal)   Pulse (!) 56   Ht 5' (1.524 m)   Wt 151 lb 14.4 oz (68.9 kg)   BMI 29.67 kg/m     Wt Readings from Last 3 Encounters:  02/20/22 151 lb 14.4 oz (68.9 kg)  01/05/22 147 lb 9.6 oz (67 kg)  12/27/21 140 lb 14.4 oz (63.9 kg)    GEN: Well nourished, well developed in no acute distress HEENT: Normal, moist mucous membranes NECK: No JVD CARDIAC: regular rhythm, normal S1 and S2, no rubs or gallops. 2/6 systolic murmur.  VASCULAR: Radial and DP pulses 2+ bilaterally. No carotid bruits RESPIRATORY:  Clear to auscultation without rales, wheezing or rhonchi. ABDOMEN: Soft, non-tender, non-distended MUSCULOSKELETAL:   Ambulates independently SKIN: Warm and dry, trivial LE edema NEUROLOGIC:  Alert and oriented x 3. No focal neuro deficits noted. PSYCHIATRIC:  Normal affect    ASSESSMENT:    1. Paroxysmal atrial fibrillation (HCC)   2. Secondary hypercoagulable state ([facility]   3. Long term current use of anticoagulant   4. NICM (nonischemic cardiomyopathy) ([place] [place]   5. Stage 3b chronic kidney disease ([facility]   6. Essential hypertension    PLAN:    Shortness of breath, cough, fatigue -in sinus brady today -lungs overall clear -continue current management of her URI  Paroxysmal  atrial fibrillation/atrial flutter Chronic systolic and diastolic heart failure Nonischemic cardiomyopathy -EF 40-45%. Not significantly changed with holding sinus rhythm -Cath with nonobstructive CAD -consolidated metoprolol to succinate at max dose -would prefer not to use diltiazem given low EF, but was not rate controlled on metoprolol alone. Will continue diltiazem for now. -CHA2DS2/VAS Stroke Risk Points=8  -continue apixaban 5 mg BID -tolerating amiodarone  Chronic kidney disease, stage 3b vs stage 4 Hypertension -ARB held during recent hospitalization -on hydralazine 50 mg TID. BP elevated today but she is feeling poorly. If remains elevated once she feels improved, could increase to 75 mg TID -imdur added by nephrology, continue -followed by nephrology  OSA: continue CPAP  Moderate aortic stenosis Mild-moderate MR -last echo 12/2021, follow annually or for change in symptoms  Bilateral LE edema, R >L: only trivial today -has history of vein stripping as well as bilateral knee arthroplasty, predisposing her to venous insufficiency -she is otherwise euvolemic on exam today -previously discussed venous insufficiency, compression stockings, elevation  CAD, nonobstructive, without angina -given age, continue apixaban and no aspirin -continue atorvastatin  Plan for follow up: 3 months or sooner if  needed   Medication Adjustments/Labs and Tests Ordered: Current medicines are reviewed at length with the patient today.  Concerns regarding medicines are outlined above.   Orders Placed This Encounter  Procedures   EKG 12-Lead   No orders of the defined types were placed in this encounter.  Patient Instructions  Medication Instructions:  Your Physician recommend you continue on your current medication as directed.    *If you need a refill on your cardiac medications before your next appointment, please call your pharmacy*   Lab Work: None ordered today   Testing/Procedures: None ordered today   Follow-Up: At [facility], you and your health needs are our priority.  As part of our continuing mission to provide you with exceptional heart care, we have created designated Provider Care Teams.  These Care Teams include your primary Cardiologist (physician) and Advanced Practice Providers (APPs -  Physician Assistants and Nurse Practitioners) who all work together to provide you with the care you need, when you need it.  We recommend signing up for the patient portal called "MyChart".  Sign up information is provided on this After Visit Summary.  MyChart is used to connect with patients for Virtual Visits (Telemedicine).  Patients are able to view lab/test results, encounter notes, upcoming appointments, etc.  Non-urgent messages can be sent to your provider as well.   To learn more about what you can do with MyChart, go to [email or web address].    Your next appointment:   3 month(s)  The format for your next appointment:   In Person  Provider:   Buford Dresser, MD            I,Alexis Herring,acting as a scribe for Buford Dresser, MD.,have documented all relevant documentation on the behalf of Buford Dresser, MD,as directed by  Buford Dresser, MD while in the presence of Buford Dresser, MD.  I, Buford Dresser, MD,  have reviewed all documentation for this visit. The documentation on 03/19/22 for the exam, diagnosis, procedures, and orders are all accurate and complete.   Signed, Buford Dresser, MD PhD 02/20/2022    [place]

## 2022-02-25 ENCOUNTER — Other Ambulatory Visit: Payer: Self-pay | Admitting: Cardiology

## 2022-02-25 NOTE — Telephone Encounter (Signed)
Prescription refill request for Eliquis received. Indication: PAF Last office visit: 02/20/22  C Bridgette MD Scr: 1.48 on 02/04/22 Age: 86 Weight: 68.9kg  Will continue Eliquis 2.13m twice daily since baseline Scr is > 1.5.  Refill approved.

## 2022-03-02 ENCOUNTER — Telehealth: Payer: Self-pay | Admitting: Cardiology

## 2022-03-02 NOTE — Telephone Encounter (Signed)
Discussed with Dr Harrell Gave who suggested increasing Hydralazine from 50 mg to 75 mg three times a day Advised patient, verbalized understanding  Will call back if no improvement

## 2022-03-02 NOTE — Telephone Encounter (Signed)
Returned call to patient,   Patient states that her blood pressure is very high today, has been running from the 180/90s to 200/100s  11/04: 181/82 11/13: 203/99  Headache and blurry vision. Headaches everyday when it has been high. Patient does also have macular degeneration and glaucoma.   Losartan was stopped in the hospital. Patient has still been taking her Hydralazine and Metoprolol.   Routing and printing for Dr. Harrell Gave to review.

## 2022-03-02 NOTE — Telephone Encounter (Signed)
Pt c/o BP issue: STAT if pt c/o blurred vision, one-sided weakness or slurred speech  1. What are your last 5 BP readings?  11/04: 181/82  11/13: 203/99  2. Are you having any other symptoms (ex. Dizziness, headache, blurred vision, passed out)? Headache   3. What is your BP issue?  BP has been elevated.

## 2022-03-05 ENCOUNTER — Other Ambulatory Visit (HOSPITAL_COMMUNITY): Payer: Self-pay | Admitting: Nurse Practitioner

## 2022-03-05 DIAGNOSIS — I4819 Other persistent atrial fibrillation: Secondary | ICD-10-CM

## 2022-03-17 ENCOUNTER — Inpatient Hospital Stay (HOSPITAL_BASED_OUTPATIENT_CLINIC_OR_DEPARTMENT_OTHER)
Admission: EM | Admit: 2022-03-17 | Discharge: 2022-03-22 | DRG: 309 | Disposition: A | Discharge status: Home / Self Care | Payer: Medicare Other | Attending: Internal Medicine | Admitting: Internal Medicine

## 2022-03-17 ENCOUNTER — Encounter (HOSPITAL_COMMUNITY): Payer: Self-pay

## 2022-03-17 ENCOUNTER — Encounter (HOSPITAL_BASED_OUTPATIENT_CLINIC_OR_DEPARTMENT_OTHER): Payer: Self-pay | Admitting: Emergency Medicine

## 2022-03-17 ENCOUNTER — Emergency Department (HOSPITAL_BASED_OUTPATIENT_CLINIC_OR_DEPARTMENT_OTHER): Payer: Medicare Other | Admitting: Radiology

## 2022-03-17 ENCOUNTER — Telehealth (HOSPITAL_BASED_OUTPATIENT_CLINIC_OR_DEPARTMENT_OTHER): Payer: Self-pay | Admitting: Cardiology

## 2022-03-17 ENCOUNTER — Other Ambulatory Visit: Payer: Self-pay

## 2022-03-17 DIAGNOSIS — J449 Chronic obstructive pulmonary disease, unspecified: Secondary | ICD-10-CM | POA: Diagnosis present

## 2022-03-17 DIAGNOSIS — Z91018 Allergy to other foods: Secondary | ICD-10-CM

## 2022-03-17 DIAGNOSIS — R030 Elevated blood-pressure reading, without diagnosis of hypertension: Secondary | ICD-10-CM

## 2022-03-17 DIAGNOSIS — N1831 Chronic kidney disease, stage 3a: Secondary | ICD-10-CM | POA: Diagnosis present

## 2022-03-17 DIAGNOSIS — Z888 Allergy status to other drugs, medicaments and biological substances status: Secondary | ICD-10-CM

## 2022-03-17 DIAGNOSIS — K900 Celiac disease: Secondary | ICD-10-CM | POA: Diagnosis present

## 2022-03-17 DIAGNOSIS — M159 Polyosteoarthritis, unspecified: Secondary | ICD-10-CM | POA: Diagnosis present

## 2022-03-17 DIAGNOSIS — I2510 Atherosclerotic heart disease of native coronary artery without angina pectoris: Secondary | ICD-10-CM | POA: Diagnosis present

## 2022-03-17 DIAGNOSIS — I5022 Chronic systolic (congestive) heart failure: Secondary | ICD-10-CM | POA: Diagnosis present

## 2022-03-17 DIAGNOSIS — Z9049 Acquired absence of other specified parts of digestive tract: Secondary | ICD-10-CM

## 2022-03-17 DIAGNOSIS — Z87891 Personal history of nicotine dependence: Secondary | ICD-10-CM

## 2022-03-17 DIAGNOSIS — Z79890 Hormone replacement therapy (postmenopausal): Secondary | ICD-10-CM

## 2022-03-17 DIAGNOSIS — Z7901 Long term (current) use of anticoagulants: Secondary | ICD-10-CM

## 2022-03-17 DIAGNOSIS — I130 Hypertensive heart and chronic kidney disease with heart failure and stage 1 through stage 4 chronic kidney disease, or unspecified chronic kidney disease: Secondary | ICD-10-CM | POA: Diagnosis present

## 2022-03-17 DIAGNOSIS — X58XXXA Exposure to other specified factors, initial encounter: Secondary | ICD-10-CM | POA: Diagnosis not present

## 2022-03-17 DIAGNOSIS — N3010 Interstitial cystitis (chronic) without hematuria: Secondary | ICD-10-CM | POA: Diagnosis present

## 2022-03-17 DIAGNOSIS — Z881 Allergy status to other antibiotic agents status: Secondary | ICD-10-CM

## 2022-03-17 DIAGNOSIS — I352 Nonrheumatic aortic (valve) stenosis with insufficiency: Secondary | ICD-10-CM | POA: Diagnosis present

## 2022-03-17 DIAGNOSIS — I4891 Unspecified atrial fibrillation: Secondary | ICD-10-CM | POA: Diagnosis present

## 2022-03-17 DIAGNOSIS — G4733 Obstructive sleep apnea (adult) (pediatric): Secondary | ICD-10-CM | POA: Diagnosis present

## 2022-03-17 DIAGNOSIS — K219 Gastro-esophageal reflux disease without esophagitis: Secondary | ICD-10-CM | POA: Diagnosis present

## 2022-03-17 DIAGNOSIS — Z79899 Other long term (current) drug therapy: Secondary | ICD-10-CM

## 2022-03-17 DIAGNOSIS — Z8619 Personal history of other infectious and parasitic diseases: Secondary | ICD-10-CM

## 2022-03-17 DIAGNOSIS — H35039 Hypertensive retinopathy, unspecified eye: Secondary | ICD-10-CM | POA: Diagnosis present

## 2022-03-17 DIAGNOSIS — I10 Essential (primary) hypertension: Secondary | ICD-10-CM

## 2022-03-17 DIAGNOSIS — Z96659 Presence of unspecified artificial knee joint: Secondary | ICD-10-CM | POA: Diagnosis present

## 2022-03-17 DIAGNOSIS — R072 Precordial pain: Secondary | ICD-10-CM

## 2022-03-17 DIAGNOSIS — Z8719 Personal history of other diseases of the digestive system: Secondary | ICD-10-CM

## 2022-03-17 DIAGNOSIS — Z66 Do not resuscitate: Secondary | ICD-10-CM | POA: Diagnosis present

## 2022-03-17 DIAGNOSIS — Z8249 Family history of ischemic heart disease and other diseases of the circulatory system: Secondary | ICD-10-CM

## 2022-03-17 DIAGNOSIS — Z87440 Personal history of urinary (tract) infections: Secondary | ICD-10-CM

## 2022-03-17 DIAGNOSIS — F32A Depression, unspecified: Secondary | ICD-10-CM | POA: Diagnosis present

## 2022-03-17 DIAGNOSIS — E213 Hyperparathyroidism, unspecified: Secondary | ICD-10-CM | POA: Diagnosis present

## 2022-03-17 DIAGNOSIS — Z90710 Acquired absence of both cervix and uterus: Secondary | ICD-10-CM

## 2022-03-17 DIAGNOSIS — H409 Unspecified glaucoma: Secondary | ICD-10-CM | POA: Diagnosis present

## 2022-03-17 DIAGNOSIS — Z885 Allergy status to narcotic agent status: Secondary | ICD-10-CM

## 2022-03-17 DIAGNOSIS — I4819 Other persistent atrial fibrillation: Principal | ICD-10-CM | POA: Diagnosis present

## 2022-03-17 DIAGNOSIS — Z86718 Personal history of other venous thrombosis and embolism: Secondary | ICD-10-CM

## 2022-03-17 DIAGNOSIS — E1122 Type 2 diabetes mellitus with diabetic chronic kidney disease: Secondary | ICD-10-CM | POA: Diagnosis present

## 2022-03-17 DIAGNOSIS — I272 Pulmonary hypertension, unspecified: Secondary | ICD-10-CM | POA: Diagnosis present

## 2022-03-17 DIAGNOSIS — H9190 Unspecified hearing loss, unspecified ear: Secondary | ICD-10-CM | POA: Diagnosis present

## 2022-03-17 DIAGNOSIS — M79671 Pain in right foot: Secondary | ICD-10-CM | POA: Diagnosis not present

## 2022-03-17 DIAGNOSIS — I480 Paroxysmal atrial fibrillation: Secondary | ICD-10-CM | POA: Diagnosis present

## 2022-03-17 DIAGNOSIS — E039 Hypothyroidism, unspecified: Secondary | ICD-10-CM | POA: Diagnosis present

## 2022-03-17 DIAGNOSIS — Z880 Allergy status to penicillin: Secondary | ICD-10-CM

## 2022-03-17 DIAGNOSIS — I4892 Unspecified atrial flutter: Secondary | ICD-10-CM | POA: Diagnosis present

## 2022-03-17 DIAGNOSIS — Z95828 Presence of other vascular implants and grafts: Secondary | ICD-10-CM

## 2022-03-17 DIAGNOSIS — R0789 Other chest pain: Secondary | ICD-10-CM

## 2022-03-17 DIAGNOSIS — M810 Age-related osteoporosis without current pathological fracture: Secondary | ICD-10-CM | POA: Diagnosis present

## 2022-03-17 DIAGNOSIS — S92344A Nondisplaced fracture of fourth metatarsal bone, right foot, initial encounter for closed fracture: Secondary | ICD-10-CM | POA: Diagnosis not present

## 2022-03-17 DIAGNOSIS — E785 Hyperlipidemia, unspecified: Secondary | ICD-10-CM | POA: Diagnosis present

## 2022-03-17 LAB — CBC
HCT: 37.6 % (ref 36.0–46.0)
Hemoglobin: 12 g/dL (ref 12.0–15.0)
MCH: 31.3 pg (ref 26.0–34.0)
MCHC: 31.9 g/dL (ref 30.0–36.0)
MCV: 98.2 fL (ref 80.0–100.0)
Platelets: 199 10*3/uL (ref 150–400)
RBC: 3.83 MIL/uL — ABNORMAL LOW (ref 3.87–5.11)
RDW: 15.8 % — ABNORMAL HIGH (ref 11.5–15.5)
WBC: 6.1 10*3/uL (ref 4.0–10.5)
nRBC: 0 % (ref 0.0–0.2)

## 2022-03-17 LAB — BASIC METABOLIC PANEL
Anion gap: 12 (ref 5–15)
BUN: 20 mg/dL (ref 8–23)
CO2: 23 mmol/L (ref 22–32)
Calcium: 9.6 mg/dL (ref 8.9–10.3)
Chloride: 103 mmol/L (ref 98–111)
Creatinine, Ser: 1.1 mg/dL — ABNORMAL HIGH (ref 0.44–1.00)
GFR, Estimated: 50 mL/min — ABNORMAL LOW (ref >60)
Glucose, Bld: 105 mg/dL — ABNORMAL HIGH (ref 70–99)
Potassium: 3.9 mmol/L (ref 3.5–5.1)
Sodium: 138 mmol/L (ref 135–145)

## 2022-03-17 LAB — TROPONIN I (HIGH SENSITIVITY)
Troponin I (High Sensitivity): 11 ng/L (ref <18)
Troponin I (High Sensitivity): 12 ng/L (ref <18)

## 2022-03-17 MED ORDER — DILTIAZEM HCL-DEXTROSE 125-5 MG/125ML-% IV SOLN (PREMIX)
5.0000 mg/h | INTRAVENOUS | Status: DC
  Administered 2022-03-17 – 2022-03-18 (×2): 5 mg/h via INTRAVENOUS
  Filled 2022-03-17 (×4): qty 125

## 2022-03-17 MED ORDER — ACETAMINOPHEN 325 MG PO TABS
650.0000 mg | ORAL_TABLET | Freq: Once | ORAL | Status: AC
  Administered 2022-03-17: 650 mg via ORAL
  Filled 2022-03-17: qty 2

## 2022-03-17 MED ORDER — ALUM & MAG HYDROXIDE-SIMETH 200-200-20 MG/5ML PO SUSP
30.0000 mL | Freq: Once | ORAL | Status: AC
  Administered 2022-03-17: 30 mL via ORAL
  Filled 2022-03-17: qty 30

## 2022-03-17 MED ORDER — SODIUM CHLORIDE 0.9 % IV BOLUS
500.0000 mL | Freq: Once | INTRAVENOUS | Status: AC
  Administered 2022-03-17: 500 mL via INTRAVENOUS

## 2022-03-17 MED ORDER — DILTIAZEM LOAD VIA INFUSION
10.0000 mg | Freq: Once | INTRAVENOUS | Status: AC
  Administered 2022-03-17: 10 mg via INTRAVENOUS
  Filled 2022-03-17: qty 10

## 2022-03-17 MED ORDER — FAMOTIDINE 20 MG PO TABS
20.0000 mg | ORAL_TABLET | Freq: Once | ORAL | Status: AC
  Administered 2022-03-17: 20 mg via ORAL
  Filled 2022-03-17: qty 1

## 2022-03-17 NOTE — Progress Notes (Signed)
Plan of Care Note for accepted transfer   Patient: Amy Hickman MRN: [id number]   DOA: 03/17/2022  Facility requesting transfer: [facility] Requesting Provider: Redmond School, MD Reason for transfer: Atrial fibrillation with rapid ventricular response Facility course: Amy Hickman is a 86 y.o. female with hx afib, c/o chest discomfort in past couple days, that is mild, dull, discomfort, non radiating, intermittent, at rest, not pleuritic. Also w some burping, no heartburn. States hx afib, compliant w current meds. Denies hx cad/stent - reports prior cath with minor blockages several yrs ago.  Denies exertional chest pain or discomfort. No cough or uri symptoms. No abd pain or nv. No sob. No extremity pain or swelling. No fever or chills. No gu c/o.  The patient has been mostly in atrial fibrillation with RVR on monitor with occasional sinus tachycardia.  When she came to the ER, BP was 182/110 with heart rate of 126 with otherwise normal vital signs.  Labs revealed unremarkable BMP and CBC.-Troponin was 11 and later 12.  EKG showed sinus tachycardia with RVR of 126 with PVCs.  On monitor she may has been mostly in atrial fibrillation with RVR.  2 view chest x-ray showed no acute cardiopulmonary disease.  The patient was given 650 mg of p.o. Tylenol and 30 mL of p.o. Maalox, 10 mg of IV Cardizem bolus followed by Cardizem drip, 20 mg of p.o. Pepcid and hydration with 500 mL IV normal saline per hour.  Heart rate was down to 88 then 104.  Plan of care: The patient is accepted for admission to Progressive unit, at [facility].  The patient will be under the care of responsibility of the ED physician until she arrives to [facility].  Author: Christel Mormon, MD 03/17/2022  Check [email or web address] for on-call coverage.  Nursing staff, Please call [place] number on Amion as soon as patient's arrival, so appropriate admitting provider can evaluate the pt.

## 2022-03-17 NOTE — Telephone Encounter (Signed)
STAT if HR is under 50 or over 120 (normal HR is 60-100 beats per minute)  What is your heart rate? 107 right now and average for today is 129.  Do you have a log of your heart rate readings (document readings)? Has been running between 125-130 yesterday and today  Do you have any other symptoms? BP is 141/87. States that she has not done anything to increase her HR. Says that she has a headache and has pain in the middle of her chest, states that she cannot describe it.  Pt c/o of Chest Pain: STAT if CP now or developed within 24 hours  1. Are you having CP right now? Not right now  2. Are you experiencing any other symptoms (ex. SOB, nausea, vomiting, sweating)? headaches  3. How long have you been experiencing CP? Started last night and into today  4. Is your CP continuous or coming and going? Coming and going  5. Have you taken Nitroglycerin? No  ?

## 2022-03-17 NOTE — ED Triage Notes (Signed)
Pt arrives to ED with c/o chest pain. The chest pain started yesterday. The CP is substernal and described as a tightness. The pain is constant. Hx Afib.

## 2022-03-17 NOTE — Telephone Encounter (Signed)
Transferred stat call from scheduling, HR rate today has been 107-129 w/ AVG 128. This has been going on for about 3 days. Chest feels heavy with pain, when moves breast an applies pressure it gets better. Patient having chest pain within a few minutes of phone call. Advised patient to report to ED for prompt evaluation.

## 2022-03-17 NOTE — ED Provider Notes (Signed)
Amy Hickman Provider Note   CSN: [id number] Arrival date & time: 03/17/22  1751     History  Chief Complaint  Patient presents with   Chest Pain    Amy Hickman is a 86 y.o. female.  Pt with hx afib, c/o chest discomfort in past couple days. Mild, dull, discomfort, non radiating, intermittent, at rest, not pleuritic. Also w some burping, no heartburn. States hx afib, compliant w current meds. Denies hx cad/stent - reports prior cath with minor blockages several yrs ago.  Denies exertional chest pain or discomfort. No cough or uri symptoms. No abd pain or nv. No sob. No extremity pain or swelling. No fever or chills. No gu c/o.   The history is provided by the patient, medical records and a relative.  Chest Pain Associated symptoms: palpitations   Associated symptoms: no abdominal pain, no back pain, no cough, no fever, no headache, no shortness of breath and no vomiting        Home Medications Prior to Admission medications   Medication Sig Start Date End Date Taking? Authorizing Provider  acetaminophen (TYLENOL) 500 MG tablet Take 1 tablet (500 mg total) by mouth every 6 (six) hours as needed for pain. Patient taking differently: Take 1,000 mg by mouth every 6 (six) hours as needed for pain or moderate pain. 06/24/12   [name], MD  amiodarone (PACERONE) 200 MG tablet Take 1 tablet (200 mg total) by mouth daily. 03/05/22   Sherran Needs, NP  apixaban (ELIQUIS) 2.5 MG TABS tablet TAKE 1 TABLET TWICE A DAY (DOSE DECREASE) 02/25/22   Buford Dresser, MD  B Complex Vitamins (VITAMIN B COMPLEX) TABS Take 1 tablet by mouth daily.    [provider]  cefdinir (OMNICEF) 300 MG capsule Take by mouth. 01/01/22   [provider]  clidinium-chlordiazePOXIDE (LIBRAX) 5-2.5 MG capsule Take 2 capsules by mouth daily as needed (cramping). 02/28/21   [provider]  CRANBERRY-VITAMIN C PO Take 1 capsule by mouth in the  morning.    [provider]  diltiazem (CARDIZEM CD) 360 MG 24 hr capsule Take 1 capsule (360 mg total) by mouth daily. 06/12/21   Buford Dresser, MD  esomeprazole (NEXIUM) 40 MG capsule Take 40 mg by mouth daily before breakfast.    [provider]  hydrALAZINE (APRESOLINE) 50 MG tablet Take 50 mg by mouth as directed. Take 1 and 1/2 tablets three times a day    [provider]  Hyoscyamine Sulfate SL 0.125 MG SUBL Place 0.125 mg under the tongue every 4 (four) hours as needed (cramping). 04/24/21   [provider]  isosorbide dinitrate (ISORDIL) 10 MG tablet Take 10 mg by mouth 2 (two) times daily. 12/31/21   [provider]  levothyroxine (SYNTHROID) 100 MCG tablet Take 100 mcg by mouth daily before breakfast. 02/09/22   [provider]  metoprolol (TOPROL-XL) 100 MG 24 hr tablet Take 1 tablet (100 mg total) by mouth daily. Take with or immediately following a meal. Patient taking differently: Take 100 mg by mouth every evening. 08/11/21 08/06/22  Sherran Needs, NP  Netarsudil-Latanoprost (ROCKLATAN) 0.02-0.005 % SOLN Apply to eye. 12/30/21   [provider]  pilocarpine (PILOCAR) 1 % ophthalmic solution Place 1 drop into the left eye 2 (two) times daily. 02/13/21   [provider]  polyvinyl alcohol (ARTIFICIAL TEARS) 1.4 % ophthalmic solution Place 1 drop into both eyes as needed for dry eyes.    [provider]  Vitamin D, Ergocalciferol, (DRISDOL) 1.25 MG (50000 UNIT) CAPS capsule Take 50,000 Units by mouth every 7 (seven) days. 05/22/21   [provider]      Allergies    Ativan [lorazepam], Augmentin [amoxicillin-pot clavulanate], Avelox [moxifloxacin], Brimonidine, Chlorzoxazone, Ciprofloxacin hcl, Clavulanic acid, Crestor [rosuvastatin], Ezetimibe, Gluten meal, Nitrofuran derivatives, Phenergan [promethazine hcl], Wheat bran, Codeine, Darvon [propoxyphene hcl], and Doxycycline    Review of Systems    Review of Systems  Constitutional:  Negative for chills and fever.  HENT:  Negative for sore throat.   Eyes:  Negative for redness.  Respiratory:  Negative for cough and shortness of breath.   Cardiovascular:  Positive for chest pain and palpitations. Negative for leg swelling.  Gastrointestinal:  Negative for abdominal pain and vomiting.  Genitourinary:  Negative for dysuria and flank pain.  Musculoskeletal:  Negative for back pain and neck pain.  Skin:  Negative for rash.  Neurological:  Negative for headaches.  Hematological:  Does not bruise/bleed easily.  Psychiatric/Behavioral:  Negative for confusion.     Physical Exam Updated Vital Signs BP (!) 182/110 (BP Location: Right Arm)   Pulse (!) 126   Temp 97.8 F (36.6 C)   Resp 18   Ht 1.524 m (5')   Wt 64.9 kg   SpO2 99%   BMI 27.93 kg/m  Physical Exam Vitals and nursing note reviewed.  Constitutional:      Appearance: Normal appearance. She is well-developed.  HENT:     Head: Atraumatic.     Nose: Nose normal.     Mouth/Throat:     Mouth: Mucous membranes are moist.  Eyes:     General: No scleral icterus.    Conjunctiva/sclera: Conjunctivae normal.  Neck:     Trachea: No tracheal deviation.     Comments: Trachea midline. Thyroid not grossly enlarged or tender.  Cardiovascular:     Rate and Rhythm: Tachycardia present. Rhythm irregular.     Pulses: Normal pulses.     Heart sounds: Murmur heard.     No friction rub. No gallop.  Pulmonary:     Effort: Pulmonary effort is normal. No respiratory distress.     Breath sounds: Normal breath sounds.  Abdominal:     General: Bowel sounds are normal. There is no distension.     Palpations: Abdomen is soft.     Tenderness: There is no abdominal tenderness.  Genitourinary:    Comments: No cva tenderness.  Musculoskeletal:        General: No swelling or tenderness.     Cervical back: Normal range of motion and neck supple. No rigidity. No muscular tenderness.      Right lower leg: No edema.     Left lower leg: No edema.  Skin:    General: Skin is warm and dry.     Findings: No rash.  Neurological:     Mental Status: She is alert.     Comments: Alert, speech normal. Motor/sens grossly intact bil.   Psychiatric:        Mood and Affect: Mood normal.     ED Results / Procedures / Treatments   Labs (all labs ordered are listed, but only abnormal results are displayed) Results for orders placed or performed during the hospital encounter of 20/25/42  Basic metabolic panel  Result Value Ref Range   Sodium 138 135 - 145 mmol/L   Potassium 3.9 3.5 - 5.1 mmol/L   Chloride 103 98 - 111 mmol/L   CO2  23 22 - 32 mmol/L   Glucose, Bld 105 (H) 70 - 99 mg/dL   BUN 20 8 - 23 mg/dL   Creatinine, Ser 1.10 (H) 0.44 - 1.00 mg/dL   Calcium 9.6 8.9 - 10.3 mg/dL   GFR, Estimated 50 (L) >60 mL/min   Anion gap 12 5 - 15  CBC  Result Value Ref Range   WBC 6.1 4.0 - 10.5 K/uL   RBC 3.83 (L) 3.87 - 5.11 MIL/uL   Hemoglobin 12.0 12.0 - 15.0 g/dL   HCT 37.6 36.0 - 46.0 %   MCV 98.2 80.0 - 100.0 fL   MCH 31.3 26.0 - 34.0 pg   MCHC 31.9 30.0 - 36.0 g/dL   RDW 15.8 (H) 11.5 - 15.5 %   Platelets 199 150 - 400 K/uL   nRBC 0.0 0.0 - 0.2 %  Troponin I (High Sensitivity)  Result Value Ref Range   Troponin I (High Sensitivity) 11 <18 ng/L  Troponin I (High Sensitivity)  Result Value Ref Range   Troponin I (High Sensitivity) 12 <18 ng/L   DG Chest 2 View  Result Date: 03/17/2022 CLINICAL DATA:  Chest pain. EXAM: CHEST - 2 VIEW COMPARISON:  December 22, 2021. FINDINGS: The heart size and mediastinal contours are within normal limits. Both lungs are clear. Status post bilateral shoulder arthroplasties. IMPRESSION: No active cardiopulmonary disease. Electronically Signed   By: Marijo Conception M.D.   On: 03/17/2022 18:59     EKG EKG Interpretation  Date/Time:  Tuesday March 17 2022 18:01:47 EST Ventricular Rate:  128 PR Interval:  206 QRS Duration: 78 QT  Interval:  388 QTC Calculation: 566 R Axis:   -26 Text Interpretation: Sinus tachycardia with Premature ventricular complexes Non-specific ST-t changes Confirmed by Lajean Saver [phone]) on 03/17/2022 7:30:28 PM  Radiology DG Chest 2 View  Result Date: 03/17/2022 CLINICAL DATA:  Chest pain. EXAM: CHEST - 2 VIEW COMPARISON:  December 22, 2021. FINDINGS: The heart size and mediastinal contours are within normal limits. Both lungs are clear. Status post bilateral shoulder arthroplasties. IMPRESSION: No active cardiopulmonary disease. Electronically Signed   By: Marijo Conception M.D.   On: 03/17/2022 18:59    Procedures Procedures    Medications Ordered in ED Medications  sodium chloride 0.9 % bolus 500 mL (has no administration in time range)  diltiazem (CARDIZEM) 1 mg/mL load via infusion 10 mg (has no administration in time range)    And  diltiazem (CARDIZEM) 125 mg in dextrose 5% 125 mL (1 mg/mL) infusion (has no administration in time range)  acetaminophen (TYLENOL) tablet 650 mg (has no administration in time range)  famotidine (PEPCID) tablet 20 mg (has no administration in time range)  alum & mag hydroxide-simeth (MAALOX/MYLANTA) 200-200-20 MG/5ML suspension 30 mL (has no administration in time range)    ED Course/ Medical Decision Making/ A&P                           Medical Decision Making Problems Addressed: Atrial fibrillation with rapid ventricular response ([place]): acute illness or injury with systemic symptoms that poses a threat to life or bodily functions Chest discomfort: acute illness or injury with systemic symptoms that poses a threat to life or bodily functions Elevated blood pressure reading: acute illness or injury Essential hypertension: chronic illness or injury with exacerbation, progression, or side effects of treatment that poses a threat to life or bodily functions Precordial chest pain: acute illness or injury  with systemic symptoms that poses a threat to  life or bodily functions  Amount and/or Complexity of Data Reviewed Independent Historian:     Details: Family, hx External Data Reviewed: labs and notes. Labs: ordered. Decision-making details documented in ED Course. Radiology: ordered and independent interpretation performed. Decision-making details documented in ED Course. ECG/medicine tests: ordered and independent interpretation performed. Decision-making details documented in ED Course. Discussion of management or test interpretation with external provider(s): Medicine/hospitalists  Risk OTC drugs. Prescription drug management. Decision regarding hospitalization.   Iv ns. Continuous pulse ox and cardiac monitoring. Labs ordered/sent. Imaging ordered.   Reviewed nursing notes and prior charts for additional history. External reports reviewed. Additional history from: family.   Ns bolus. Cardizem bolus/gtt.   Cardiac monitor: mostly afib, periods sinus, rate 115.  Labs reviewed/interpreted by me - hgb and wbc normal. Trop normal. K normal   Xrays reviewed/interpreted by me - no pna.   Rate improved on diltiazem gtt, but intermittently in 120-130s.  Given persistent/recurrent chest pain, and uncontrolled/rapid afib, will admit.   Hospitalists consulted for admission.   CRITICAL CARE RE; new/precordial chest pain and afib with rapid ventricular response.  Performed by: Mirna Mires Total critical care time: 40 minutes Critical care time was exclusive of separately billable procedures and treating other patients. Critical care was necessary to treat or prevent imminent or life-threatening deterioration. Critical care was time spent personally by me on the following activities: development of treatment plan with patient and/or surrogate as well as nursing, discussions with consultants, evaluation of patient's response to treatment, examination of patient, obtaining history from patient or surrogate, ordering and performing  treatments and interventions, ordering and review of laboratory studies, ordering and review of radiographic studies, pulse oximetry and re-evaluation of patient's condition.          Final Clinical Impression(s) / ED Diagnoses Final diagnoses:  None    Rx / DC Orders ED Discharge Orders     None         Lajean Saver, MD 03/17/22 2237

## 2022-03-17 NOTE — Discharge Instructions (Addendum)
It was our pleasure to provide your ER care today - we hope that you feel better.  Drink plenty of fluids/stay well hydrated. Continue medications.  Follow up closely with your cardiologist in the next couple of days - call tomorrow AM to arrange appointment time.   Return to ER if worse, new symptoms, fevers, recurrent or persistent chest pain, increased trouble breathing, weak/fainting, or other concern.

## 2022-03-18 ENCOUNTER — Encounter (HOSPITAL_COMMUNITY): Payer: Self-pay | Admitting: Family Medicine

## 2022-03-18 DIAGNOSIS — S52201A Unspecified fracture of shaft of right ulna, initial encounter for closed fracture: Secondary | ICD-10-CM | POA: Insufficient documentation

## 2022-03-18 DIAGNOSIS — I4891 Unspecified atrial fibrillation: Secondary | ICD-10-CM | POA: Diagnosis not present

## 2022-03-18 LAB — CBC WITH DIFFERENTIAL/PLATELET
Abs Immature Granulocytes: 0.02 10*3/uL (ref 0.00–0.07)
Basophils Absolute: 0 10*3/uL (ref 0.0–0.1)
Basophils Relative: 1 %
Eosinophils Absolute: 0.2 10*3/uL (ref 0.0–0.5)
Eosinophils Relative: 3 %
HCT: 40.8 % (ref 36.0–46.0)
Hemoglobin: 12.5 g/dL (ref 12.0–15.0)
Immature Granulocytes: 0 %
Lymphocytes Relative: 17 %
Lymphs Abs: 1.1 10*3/uL (ref 0.7–4.0)
MCH: 30.9 pg (ref 26.0–34.0)
MCHC: 30.6 g/dL (ref 30.0–36.0)
MCV: 101 fL — ABNORMAL HIGH (ref 80.0–100.0)
Monocytes Absolute: 0.6 10*3/uL (ref 0.1–1.0)
Monocytes Relative: 10 %
Neutro Abs: 4.4 10*3/uL (ref 1.7–7.7)
Neutrophils Relative %: 69 %
Platelets: 184 10*3/uL (ref 150–400)
RBC: 4.04 MIL/uL (ref 3.87–5.11)
RDW: 15.7 % — ABNORMAL HIGH (ref 11.5–15.5)
WBC: 6.4 10*3/uL (ref 4.0–10.5)
nRBC: 0 % (ref 0.0–0.2)

## 2022-03-18 LAB — COMPREHENSIVE METABOLIC PANEL
ALT: 36 U/L (ref 0–44)
AST: 40 U/L (ref 15–41)
Albumin: 3.8 g/dL (ref 3.5–5.0)
Alkaline Phosphatase: 65 U/L (ref 38–126)
Anion gap: 9 (ref 5–15)
BUN: 12 mg/dL (ref 8–23)
CO2: 22 mmol/L (ref 22–32)
Calcium: 9.1 mg/dL (ref 8.9–10.3)
Chloride: 108 mmol/L (ref 98–111)
Creatinine, Ser: 0.95 mg/dL (ref 0.44–1.00)
GFR, Estimated: 59 mL/min — ABNORMAL LOW (ref >60)
Glucose, Bld: 92 mg/dL (ref 70–99)
Potassium: 4.1 mmol/L (ref 3.5–5.1)
Sodium: 139 mmol/L (ref 135–145)
Total Bilirubin: 0.7 mg/dL (ref 0.3–1.2)
Total Protein: 6.5 g/dL (ref 6.5–8.1)

## 2022-03-18 LAB — MAGNESIUM: Magnesium: 2.2 mg/dL (ref 1.7–2.4)

## 2022-03-18 LAB — TROPONIN I (HIGH SENSITIVITY)
Troponin I (High Sensitivity): 11 ng/L (ref <18)
Troponin I (High Sensitivity): 10 ng/L (ref <18)

## 2022-03-18 LAB — BRAIN NATRIURETIC PEPTIDE: B Natriuretic Peptide: 187.8 pg/mL — ABNORMAL HIGH (ref 0.0–100.0)

## 2022-03-18 LAB — TSH: TSH: 10.62 u[IU]/mL — ABNORMAL HIGH (ref 0.350–4.500)

## 2022-03-18 MED ORDER — CEFDINIR 300 MG PO CAPS
300.0000 mg | ORAL_CAPSULE | Freq: Every day | ORAL | Status: DC
  Administered 2022-03-18 – 2022-03-22 (×5): 300 mg via ORAL
  Filled 2022-03-18 (×5): qty 1

## 2022-03-18 MED ORDER — ACETAMINOPHEN 325 MG PO TABS
650.0000 mg | ORAL_TABLET | Freq: Four times a day (QID) | ORAL | Status: DC | PRN
  Administered 2022-03-20 – 2022-03-22 (×7): 650 mg via ORAL
  Filled 2022-03-18 (×6): qty 2

## 2022-03-18 MED ORDER — LIDOCAINE 5 % EX PTCH
1.0000 | MEDICATED_PATCH | CUTANEOUS | Status: AC
  Administered 2022-03-18: 1 via TRANSDERMAL
  Filled 2022-03-18: qty 1

## 2022-03-18 MED ORDER — HYOSCYAMINE SULFATE 0.125 MG SL SUBL: 0.1250 mg | SUBLINGUAL_TABLET | SUBLINGUAL | Status: DC | PRN

## 2022-03-18 MED ORDER — HYOSCYAMINE SULFATE SL 0.125 MG SL SUBL: 0.1250 mg | SUBLINGUAL_TABLET | SUBLINGUAL | Status: DC | PRN

## 2022-03-18 MED ORDER — LEVOTHYROXINE SODIUM 100 MCG PO TABS
100.0000 ug | ORAL_TABLET | Freq: Every day | ORAL | Status: DC
  Administered 2022-03-19 – 2022-03-22 (×4): 100 ug via ORAL
  Filled 2022-03-18 (×4): qty 1

## 2022-03-18 MED ORDER — PANTOPRAZOLE SODIUM 40 MG PO TBEC
40.0000 mg | DELAYED_RELEASE_TABLET | Freq: Every day | ORAL | Status: DC
  Administered 2022-03-18 – 2022-03-22 (×5): 40 mg via ORAL
  Filled 2022-03-18 (×5): qty 1

## 2022-03-18 MED ORDER — SODIUM CHLORIDE 0.9 % IV SOLN
INTRAVENOUS | Status: DC | PRN
  Administered 2022-03-18: 250 mL via INTRAVENOUS

## 2022-03-18 MED ORDER — PILOCARPINE HCL 1 % OP SOLN
1.0000 [drp] | Freq: Two times a day (BID) | OPHTHALMIC | Status: DC
  Administered 2022-03-18 – 2022-03-22 (×8): 1 [drp] via OPHTHALMIC
  Filled 2022-03-18: qty 15

## 2022-03-18 MED ORDER — NETARSUDIL-LATANOPROST 0.02-0.005 % OP SOLN
1.0000 [drp] | Freq: Every day | OPHTHALMIC | Status: DC
  Administered 2022-03-18 – 2022-03-21 (×4): 1 [drp] via OPHTHALMIC

## 2022-03-18 MED ORDER — METOPROLOL SUCCINATE ER 100 MG PO TB24
100.0000 mg | ORAL_TABLET | Freq: Every evening | ORAL | Status: DC
  Administered 2022-03-18 – 2022-03-22 (×5): 100 mg via ORAL
  Filled 2022-03-18 (×5): qty 1

## 2022-03-18 MED ORDER — POLYVINYL ALCOHOL 1.4 % OP SOLN: 1.0000 [drp] | OPHTHALMIC | Status: DC | PRN

## 2022-03-18 MED ORDER — APIXABAN 2.5 MG PO TABS
2.5000 mg | ORAL_TABLET | Freq: Two times a day (BID) | ORAL | Status: DC
  Administered 2022-03-18 – 2022-03-19 (×3): 2.5 mg via ORAL
  Filled 2022-03-18 (×3): qty 1

## 2022-03-18 MED ORDER — CILIDINIUM-CHLORDIAZEPOXIDE 2.5-5 MG PO CAPS: 2.0000 | ORAL_CAPSULE | Freq: Every day | ORAL | Status: DC | PRN

## 2022-03-18 MED ORDER — FENTANYL CITRATE PF 50 MCG/ML IJ SOSY
12.5000 ug | PREFILLED_SYRINGE | INTRAMUSCULAR | Status: AC | PRN
  Administered 2022-03-18 – 2022-03-20 (×3): 12.5 ug via INTRAVENOUS
  Filled 2022-03-18 (×3): qty 1

## 2022-03-18 MED ORDER — AMIODARONE HCL 200 MG PO TABS
200.0000 mg | ORAL_TABLET | Freq: Every day | ORAL | Status: DC
  Administered 2022-03-18 – 2022-03-22 (×5): 200 mg via ORAL
  Filled 2022-03-18 (×5): qty 1

## 2022-03-18 MED ORDER — ONDANSETRON HCL 4 MG/2ML IJ SOLN
4.0000 mg | Freq: Four times a day (QID) | INTRAMUSCULAR | Status: DC | PRN
  Administered 2022-03-18 – 2022-03-20 (×2): 4 mg via INTRAVENOUS
  Filled 2022-03-18 (×2): qty 2

## 2022-03-18 MED ORDER — ACETAMINOPHEN 650 MG RE SUPP: 650.0000 mg | Freq: Four times a day (QID) | RECTAL | Status: DC | PRN

## 2022-03-18 MED ORDER — ONDANSETRON HCL 4 MG PO TABS
4.0000 mg | ORAL_TABLET | Freq: Four times a day (QID) | ORAL | Status: DC | PRN
  Administered 2022-03-19 – 2022-03-21 (×2): 4 mg via ORAL
  Filled 2022-03-18 (×2): qty 1

## 2022-03-18 NOTE — ED Notes (Signed)
Handoff report given to [facility] 

## 2022-03-18 NOTE — Care Management Obs Status (Signed)
[facility] NOTIFICATION   Patient Details  Name: Amy Hickman MRN: [id number] Date of Birth: [birth year]   Medicare Observation Status Notification Given:  Yes    Rodney Booze, LCSW 03/18/2022, 4:45 PM

## 2022-03-18 NOTE — Plan of Care (Signed)
  Problem: Health Behavior/Discharge Planning: Goal: Ability to manage health-related needs will improve Outcome: Progressing   

## 2022-03-18 NOTE — Plan of Care (Signed)
  Problem: Education: Goal: Knowledge of General Education information will improve Description Including pain rating scale, medication(s)/side effects and non-pharmacologic comfort measures Outcome: Progressing   Problem: Health Behavior/Discharge Planning: Goal: Ability to manage health-related needs will improve Outcome: Progressing   

## 2022-03-18 NOTE — H&P (Signed)
History and Physical    Patient: Amy Hickman:[id number] DOB: Aug 30, 1935 DOA: 03/17/2022 DOS: the patient was seen and examined on 03/18/2022 PCP: [facility]  Patient coming from: Home  Chief Complaint:  Chief Complaint  Patient presents with   Chest Pain   HPI: Amy Hickman is a 86 y.o. female with medical history significant of GERD, chronic systolic CHF, impaired hearing, hypertension, interstitial's otitis, osteoporosis impaired fasting glucose, hypertensive retinopathy, glaucoma, history of GI bleed, GERD, celiac disease, hyperlipidemia, hyponatremia, hypothyroidism, history of DVT, osteoarthritis of multiple sites, COPD, CKD, depression, elevated LFTs, aortic valve stenosis, CAD, chronic atrial fibrillation who is coming to the emergency department due to substernal chest pain that feels pressure-like and associated with nausea and mild dyspnea.  It gets better when she puts pressure with her hand under her left breast.  The pain does not radiate and is nonpleuritic.  It can happen at rest or with exertion.  She has been having palpitations.  She drinks 2 to 3 cups of coffee daily.  She has been getting frontal headaches in the last 2 weeks, but no blurred vision.  No fever, chills or night sweats. No sore throat, rhinorrhea, dyspnea, wheezing or hemoptysis.  No diaphoresis, PND, orthopnea or pitting edema of the lower extremities.  No appetite changes, abdominal pain, diarrhea, constipation, melena or hematochezia.  No flank pain, dysuria, frequency or hematuria.  No polyuria, polydipsia, polyphagia or blurred vision.  ED course: Initial vital signs were temperature 97.8 F, pulse 126, respiration 18, BP 182/110 mmHg O2 sat 99% on room air.  The patient received acetaminophen 650 mg p.o. x 1, Maalox 30 mL x 1, famotidine 20 mg p.o. x 1, NS 500 mL bolus and diltiazem 10 mg IVP x 1 followed by a continuous infusion.  Lab work: Her CBC showed a r  white count of 6.1, hemoglobin 12.0 g/dL platelets 199.  Troponin x 4 were negative.  BNP was 188 pg/mL.  CMP this morning was normal.  Imaging: 2 view chest radiograph with no active cardiopulmonary disease.  Review of Systems: As mentioned in the history of present illness. All other systems reviewed and are negative.  Past Medical History:  Diagnosis Date   Atrial fibrillation, chronic (HCC)    s/p multiple cardioversions with sustained NSR, on Eliquis   Celiac disease    Chronic systolic CHF (congestive heart failure) (HCC)    GERD (gastroesophageal reflux disease)    HOH (hard of hearing)    Hypertension    Interstitial cystitis    Past Surgical History:  Procedure Laterality Date   ABDOMINAL HYSTERECTOMY     APPENDECTOMY     CARDIOVERSION N/A 04/24/2021   Procedure: CARDIOVERSION;  Surgeon: Jerline Pain, MD;  Location: [place];  Service: Cardiovascular;  Laterality: N/A;   CARDIOVERSION N/A 06/12/2021   Procedure: CARDIOVERSION;  Surgeon: Buford Dresser, MD;  Location: [place];  Service: Cardiovascular;  Laterality: N/A;   CARDIOVERSION N/A 08/11/2021   Procedure: CARDIOVERSION;  Surgeon: [name], MD;  Location: [facility] ENDOSCOPY;  Service: Cardiovascular;  Laterality: N/A;   CHOLECYSTECTOMY     ear drum surgery     REPLACEMENT TOTAL KNEE     ROTATOR CUFF REPAIR Right    TONSILLECTOMY     Social History:  reports that she quit smoking about 42 years ago. Her smoking use included cigarettes. She has a 66.00 pack-year smoking history. She has never used smokeless tobacco. She reports that  she does not currently use alcohol. She reports that she does not use drugs.  Allergies  Allergen Reactions   Ativan [Lorazepam] Other (See Comments)    "Knocked her out, woke up 3 days later"   Augmentin [Amoxicillin-Pot Clavulanate] Nausea And Vomiting   Avelox [Moxifloxacin] Itching and Nausea Only   Brimonidine Itching and Swelling    Other reaction(s): Other  (See Comments) redness   Chlorzoxazone Other (See Comments)    Went crazy/loopy   Ciprofloxacin Hcl Itching and Nausea Only   Clavulanic Acid     Other reaction(s): Unknown   Crestor [Rosuvastatin] Other (See Comments)    myalgia   Ezetimibe Diarrhea    Other reaction(s): Other (See Comments) Stomach cramps   Gluten Meal Other (See Comments)    Diarrhea, hot/cold sweat, will sleep for a couple of hours - Celiac disease   Nitrofuran Derivatives Itching and Nausea Only   Phenergan [Promethazine Hcl] Other (See Comments)    hallucinations   Wheat Bran     Other reaction(s): Other (See Comments) Gi distress- celiac disease   Codeine Palpitations   Darvon [Propoxyphene Hcl] Palpitations   Doxycycline Itching and Nausea Only    Family History  Problem Relation Age of Onset   Hypertension Other     Prior to Admission medications   Medication Sig Start Date End Date Taking? Authorizing Provider  acetaminophen (TYLENOL) 500 MG tablet Take 1 tablet (500 mg total) by mouth every 6 (six) hours as needed for pain. Patient taking differently: Take 1,000 mg by mouth every 6 (six) hours as needed for pain or moderate pain. 06/24/12   [name], MD  amiodarone (PACERONE) 200 MG tablet Take 1 tablet (200 mg total) by mouth daily. 03/05/22   Sherran Needs, NP  apixaban (ELIQUIS) 2.5 MG TABS tablet TAKE 1 TABLET TWICE A DAY (DOSE DECREASE) 02/25/22   Buford Dresser, MD  B Complex Vitamins (VITAMIN B COMPLEX) TABS Take 1 tablet by mouth daily.    [provider]  cefdinir (OMNICEF) 300 MG capsule Take by mouth. 01/01/22   [provider]  clidinium-chlordiazePOXIDE (LIBRAX) 5-2.5 MG capsule Take 2 capsules by mouth daily as needed (cramping). 02/28/21   [provider]  CRANBERRY-VITAMIN C PO Take 1 capsule by mouth in the morning.    [provider]  diltiazem (CARDIZEM CD) 360 MG 24 hr capsule Take 1 capsule (360 mg total) by mouth daily. 06/12/21    Buford Dresser, MD  esomeprazole (NEXIUM) 40 MG capsule Take 40 mg by mouth daily before breakfast.    [provider]  hydrALAZINE (APRESOLINE) 50 MG tablet Take 50 mg by mouth as directed. Take 1 and 1/2 tablets three times a day    [provider]  Hyoscyamine Sulfate SL 0.125 MG SUBL Place 0.125 mg under the tongue every 4 (four) hours as needed (cramping). 04/24/21   [provider]  isosorbide dinitrate (ISORDIL) 10 MG tablet Take 10 mg by mouth 2 (two) times daily. 12/31/21   [provider]  levothyroxine (SYNTHROID) 100 MCG tablet Take 100 mcg by mouth daily before breakfast. 02/09/22   [provider]  metoprolol (TOPROL-XL) 100 MG 24 hr tablet Take 1 tablet (100 mg total) by mouth daily. Take with or immediately following a meal. Patient taking differently: Take 100 mg by mouth every evening. 08/11/21 08/06/22  Sherran Needs, NP  Netarsudil-Latanoprost (ROCKLATAN) 0.02-0.005 % SOLN Apply to eye. 12/30/21   [provider]  pilocarpine (PILOCAR) 1 %  ophthalmic solution Place 1 drop into the left eye 2 (two) times daily. 02/13/21   [provider]  polyvinyl alcohol (ARTIFICIAL TEARS) 1.4 % ophthalmic solution Place 1 drop into both eyes as needed for dry eyes.    [provider]  Vitamin D, Ergocalciferol, (DRISDOL) 1.25 MG (50000 UNIT) CAPS capsule Take 50,000 Units by mouth every 7 (seven) days. 05/22/21   [provider]    Physical Exam: Vitals:   03/18/22 0845 03/18/22 1015 03/18/22 1121 03/18/22 1204  BP: (!) 140/103 133/83  (!) 162/99  Pulse: (!) 124 (!) 101  94  Resp: (!) 23 18  16   Temp:   98.7 F (37.1 C) 98.9 F (37.2 C)  TempSrc:   Oral Oral  SpO2: 96% 96%  99%  Weight:      Height:       Physical Exam Vitals and nursing note reviewed.  Constitutional:      General: She is awake. She is not in acute distress. HENT:     Head: Normocephalic.     Nose: No rhinorrhea.      Mouth/Throat:     Mouth: Mucous membranes are moist.  Eyes:     General: No scleral icterus.    Pupils: Pupils are equal, round, and reactive to light.  Neck:     Vascular: No JVD.  Cardiovascular:     Rate and Rhythm: Normal rate. Rhythm irregular.     Heart sounds: S1 normal and S2 normal. Murmur heard.     Decrescendo systolic murmur is present with a grade of 1/6.     No S3 sounds.  Pulmonary:     Effort: No tachypnea.     Breath sounds: No decreased breath sounds, wheezing, rhonchi or rales.  Abdominal:     General: Bowel sounds are normal.     Palpations: Abdomen is soft.     Tenderness: There is no abdominal tenderness.  Musculoskeletal:     Cervical back: Neck supple.     Right lower leg: No edema.     Left lower leg: No edema.  Skin:    General: Skin is warm and dry.  Neurological:     General: No focal deficit present.     Mental Status: She is alert and oriented to person, place, and time.  Psychiatric:        Mood and Affect: Mood normal.        Behavior: Behavior normal. Behavior is cooperative.   Data Reviewed:  There are no new results to review at this time.  12/24/2021 echocardiogram IMPRESSIONS    1. Left ventricular ejection fraction, by estimation, is 40 to 45%. The  left ventricle has mildly decreased function. The left ventricle  demonstrates global hypokinesis. There is mild concentric left ventricular  hypertrophy. Left ventricular diastolic  parameters are indeterminate.   2. Right ventricular systolic function is normal. The right ventricular  size is normal.   3. Left atrial size was moderately dilated.   4. The mitral valve is normal in structure. No evidence of mitral valve  regurgitation. No evidence of mitral stenosis.   5. The Aortic valve is thickened and moderately calcified. . There is  mild calcification of the aortic valve. There is mild thickening of the  aortic valve. Aortic valve regurgitation is moderate. Suspect low flow low   gradient moderate aortic stenosis.  Aortic valve area, by VTI measures 1.09 cm. Aortic valve mean gradient  measures 11.0 mmHg. Aortic valve Vmax measures  2.02 m/s.   6. The inferior vena cava is normal in size with greater than 50%  respiratory variability, suggesting right atrial pressure of 3 mmHg.   EKG: Vent. rate 128 BPM PR interval 206 ms QRS duration 78 ms QT/QTcB 388/566 ms P-R-T axes * -26 177 Sinus tachycardia with Premature ventricular complexes Non-specific ST-t changes  Assessment and Plan: Principal Problem:   Atrial fibrillation with rapid ventricular response (HCC) Observation/PCU. Continue amiodarone 200 mg daily. Continue diltiazem infusion. Continue DOAC. Continue beta-blocker daily. Keep electrolytes optimized. Cardiology will see in AM. Advised to switch to decaf coffee.  Active Problems:   Chronic systolic CHF (congestive heart failure) (HCC) No signs of decompensation.    Hypothyroidism On amiodarone. Will need adjustment of levothyroxine. Will defer adjustment until heart rate is controlled.    Coronary artery disease involving native  coronary artery of native heart without angina pectoris Her chest pain is noncardiac and reproducible Cardiology recommended Lidoderm patches.    Essential hypertension On Cardizem infusion at the moment. Resume metoprolol succinate 100 mg p.o. in the evening. Holding hydralazine to avoid reflex tachycardia. Monitor blood pressure and heart rate.    Hyperlipidemia Intolerant to statin. Follow-up with PCP and a/or cardiology.    GERD (gastroesophageal reflux disease) Continue esomeprazole or formulary equivalent.    History of DVT (deep vein thrombosis) On apixaban 2.5 mg p.o. twice daily.     Advance Care Planning:   Code Status: Prior   Consults: Cardiology will see in a.m.  Family Communication: Her daughter and granddaughter were at bedside.  Severity of Illness: The appropriate patient  status for this patient is OBSERVATION. Observation status is judged to be reasonable and necessary in order to provide the required intensity of service to ensure the patient's safety. The patient's presenting symptoms, physical exam findings, and initial radiographic and laboratory data in the context of their medical condition is felt to place them at decreased risk for further clinical deterioration. Furthermore, it is anticipated that the patient will be medically stable for discharge from the hospital within 2 midnights of admission.   Author: Reubin Milan, MD 03/18/2022 12:46 PM  For on call review [email or web address].   This document was prepared using Dragon voice recognition software and may contain some unintended transcription errors.

## 2022-03-18 NOTE — ED Notes (Signed)
Called pt.'s granddaughter per their request to advise her that patient still doesn't have a bed assignment as of this time. No  answer received. Granddaughter's name is Kinesha.Phone number  [phone].

## 2022-03-18 NOTE — ED Provider Notes (Signed)
6:27 AM Patient was awakened by nurse for morning check.  Patient states she is having chest discomfort similar to that which she had on arrival.  EKG shows atrial fibrillation with a rate of 90, no ischemic changes. Will repeat troponin.  EKG Interpretation:  Date & Time: 03/18/2022 6:20 AM  Rate: 90  Rhythm: atrial fibrillation  QRS Axis: normal  Intervals: QT prolonged  ST/T Wave abnormalities: normal  Conduction Disutrbances:none  Narrative Interpretation:   Old EKG Reviewed:  Previously sinus rhythm with PVCs       Amy Knee, MD 03/18/22 0630

## 2022-03-18 NOTE — ED Notes (Signed)
Pt. Resting with eyes closed, respirations even and unlabored. Family member at bedside appears to be sleeping.

## 2022-03-18 NOTE — ED Notes (Signed)
After pt. Was awakened to be moved to another room she c/o "chest discomfort that is an 8." Dr. Florina Ou was notified and ordered an EKG. EKG completed and given to MD. No further orders rec'd at this time.

## 2022-03-18 NOTE — Social Work (Signed)
CSW spoke to the daughter about Patient's Code 45. Code 44 given verbally to the daughter.

## 2022-03-18 NOTE — Care Management Important Message (Signed)
Important Message  Patient Details  Name: Amy Hickman MRN: [id number] Date of Birth: 09/09/1935   Medicare Important Message Given:  Yes     Rodney Booze, LCSW 03/18/2022, 4:44 PM

## 2022-03-18 NOTE — Care Management CC44 (Signed)
Condition Code 44 Documentation Completed  Patient Details  Name: Amy Hickman MRN: [id number] Date of Birth: July 08, 1935   Condition Code 44 given:  Yes Patient signature on Condition Code 44 notice:  Yes Documentation of 2 MD's agreement:  Yes Code 44 added to claim:  Yes    Rodney Booze, LCSW 03/18/2022, 4:45 PM

## 2022-03-19 ENCOUNTER — Encounter (HOSPITAL_BASED_OUTPATIENT_CLINIC_OR_DEPARTMENT_OTHER): Payer: Self-pay | Admitting: Cardiology

## 2022-03-19 DIAGNOSIS — I5022 Chronic systolic (congestive) heart failure: Secondary | ICD-10-CM

## 2022-03-19 DIAGNOSIS — K219 Gastro-esophageal reflux disease without esophagitis: Secondary | ICD-10-CM | POA: Diagnosis not present

## 2022-03-19 DIAGNOSIS — I10 Essential (primary) hypertension: Secondary | ICD-10-CM | POA: Diagnosis not present

## 2022-03-19 DIAGNOSIS — I4891 Unspecified atrial fibrillation: Secondary | ICD-10-CM | POA: Diagnosis not present

## 2022-03-19 LAB — BASIC METABOLIC PANEL
Anion gap: 7 (ref 5–15)
BUN: 15 mg/dL (ref 8–23)
CO2: 22 mmol/L (ref 22–32)
Calcium: 8.9 mg/dL (ref 8.9–10.3)
Chloride: 107 mmol/L (ref 98–111)
Creatinine, Ser: 0.95 mg/dL (ref 0.44–1.00)
GFR, Estimated: 59 mL/min — ABNORMAL LOW (ref >60)
Glucose, Bld: 103 mg/dL — ABNORMAL HIGH (ref 70–99)
Potassium: 4.3 mmol/L (ref 3.5–5.1)
Sodium: 136 mmol/L (ref 135–145)

## 2022-03-19 LAB — T4, FREE: Free T4: 1.15 ng/dL — ABNORMAL HIGH (ref 0.61–1.12)

## 2022-03-19 MED ORDER — APIXABAN 5 MG PO TABS
5.0000 mg | ORAL_TABLET | Freq: Two times a day (BID) | ORAL | Status: DC
  Administered 2022-03-19 – 2022-03-22 (×6): 5 mg via ORAL
  Filled 2022-03-19 (×6): qty 1

## 2022-03-19 MED ORDER — ALUM & MAG HYDROXIDE-SIMETH 200-200-20 MG/5ML PO SUSP
30.0000 mL | Freq: Four times a day (QID) | ORAL | Status: DC | PRN
  Administered 2022-03-19: 30 mL via ORAL
  Filled 2022-03-19: qty 30

## 2022-03-19 MED ORDER — DILTIAZEM HCL ER COATED BEADS 180 MG PO CP24
360.0000 mg | ORAL_CAPSULE | Freq: Every day | ORAL | Status: DC
  Administered 2022-03-19 – 2022-03-22 (×4): 360 mg via ORAL
  Filled 2022-03-19 (×4): qty 2

## 2022-03-19 NOTE — Progress Notes (Signed)
Pt refused CPAP

## 2022-03-19 NOTE — Progress Notes (Signed)
[facility]                                                                               Amy Hickman, is a 86 y.o. female, DOB - 12-Jan-1936, [place] date - 03/17/2022    Outpatient Primary MD for the patient is [facility]  LOS - 1  days    Brief summary    Amy Hickman is a 86 y.o. female with medical history significant of GERD, chronic systolic CHF, impaired hearing, hypertension, interstitial's otitis, osteoporosis impaired fasting glucose, hypertensive retinopathy, glaucoma, history of GI bleed, GERD, celiac disease, hyperlipidemia, hyponatremia, hypothyroidism, history of DVT, osteoarthritis of multiple sites, COPD, CKD, depression, elevated LFTs, aortic valve stenosis, CAD, chronic atrial fibrillation who is coming to the emergency department due to substernal chest pain that feels pressure-like and associated with nausea and mild dyspnea. She was admitted for atrial fib with RVR.    Assessment & Plan    Assessment and Plan: Atrial fibrillation with RVR Rate controlled on IV Cardizem transition to oral diltiazem Continue with Eliquis 5 mg twice daily Cardiology consulted and recommendations given. Monitor heart rate overnight and possible discharge home in the morning.    Chronic systolic CHF No signs of decompensation.    Hypothyroidism TSH slightly elevated, get free T4 and T3 levels.    Hypertension Blood pressure parameters are optimal    Hyperlipidemia Continue with statin.     History of DVT On Eliquis.    GERD Stable.   Estimated body mass index is 27.93 kg/m as calculated from the following:   Height as of this encounter: 5' (1.524 m).   Weight as of this encounter: 64.9 kg.  Code Status: DNR.  DVT Prophylaxis:  SCDs Start: 03/18/22 1248 apixaban (ELIQUIS) tablet 2.5 mg Start: 03/18/22 1000 apixaban (ELIQUIS) tablet 2.5 mg   Level of Care: Level of care:  Progressive Family Communication: Updated patient's family at bedside.   Disposition Plan:     Remains inpatient appropriate:  rate control.   Procedures:  None.  Consultants:   Cardiology.   Antimicrobials:   Anti-infectives (From admission, onward)    Start     Dose/Rate Route Frequency Ordered Stop   03/18/22 1700  cefdinir (OMNICEF) capsule 300 mg        300 mg Oral Daily 03/18/22 1437          Medications  Scheduled Meds:  amiodarone  200 mg Oral Daily   apixaban  2.5 mg Oral BID   cefdinir  300 mg Oral Daily   diltiazem  360 mg Oral Daily   levothyroxine  100 mcg Oral QAC breakfast   metoprolol succinate  100 mg Oral QPM   Netarsudil-Latanoprost  1 drop Ophthalmic QHS   pantoprazole  40 mg Oral Daily   pilocarpine  1 drop Left Eye BID   Continuous Infusions:  sodium chloride 10 mL/hr at 03/19/22 0600   PRN Meds:.sodium chloride, acetaminophen **OR** acetaminophen, clidinium-chlordiazePOXIDE, fentaNYL (SUBLIMAZE) injection, hyoscyamine, ondansetron **OR** ondansetron (ZOFRAN) IV, polyvinyl alcohol    Subjective:   Amy Hickman was seen and examined today.  No chest pain or sob.  Objective:   Vitals:   03/19/22 0004 03/19/22 0030 03/19/22 0418 03/19/22 1027  BP:  (!) 152/95 (!) 155/82 (!) 140/74  Pulse:  95 83   Resp: 15 18    Temp:  98.2 F (36.8 C) 98.3 F (36.8 C)   TempSrc:  Oral Oral   SpO2:  94% 93%   Weight:      Height:        Intake/Output Summary (Last 24 hours) at 03/19/2022 1316 Last data filed at 03/19/2022 0900 Gross per 24 hour  Intake 389.22 ml  Output 550 ml  Net -160.78 ml   Filed Weights   03/17/22 1757  Weight: 64.9 kg     Exam General: Alert and oriented x 3, NAD Cardiovascular: S1 S2 auscultated, no murmurs, RRR Respiratory: Clear to auscultation bilaterally, no wheezing, rales or rhonchi Gastrointestinal: Soft, nontender, nondistended, + bowel sounds Ext: no pedal edema bilaterally Neuro: AAOx3, Cr N's II-  XII. Strength 5/5 upper and lower extremities bilaterally Skin: No rashes Psych: Normal affect and demeanor, alert and oriented x3    Data Reviewed:  I have personally reviewed following labs and imaging studies   CBC Lab Results  Component Value Date   WBC 6.4 03/18/2022   RBC 4.04 03/18/2022   HGB 12.5 03/18/2022   HCT 40.8 03/18/2022   MCV 101.0 (H) 03/18/2022   MCH 30.9 03/18/2022   PLT 184 03/18/2022   MCHC 30.6 03/18/2022   RDW 15.7 (H) 03/18/2022   LYMPHSABS 1.1 03/18/2022   MONOABS 0.6 03/18/2022   EOSABS 0.2 03/18/2022   BASOSABS 0.0 82/42/3536     Last metabolic panel Lab Results  Component Value Date   NA 136 03/19/2022   K 4.3 03/19/2022   CL 107 03/19/2022   CO2 22 03/19/2022   BUN 15 03/19/2022   CREATININE 0.95 03/19/2022   GLUCOSE 103 (H) 03/19/2022   GFRNONAA 59 (L) 03/19/2022   GFRAA 54 (L) 01/22/2019   CALCIUM 8.9 03/19/2022   PHOS 2.8 03/16/2021   PROT 6.5 03/18/2022   ALBUMIN 3.8 03/18/2022   BILITOT 0.7 03/18/2022   ALKPHOS 65 03/18/2022   AST 40 03/18/2022   ALT 36 03/18/2022   ANIONGAP 7 03/19/2022    CBG (last 3)  No results for input(s): "GLUCAP" in the last 72 hours.    Coagulation Profile: No results for input(s): "INR", "PROTIME" in the last 168 hours.   Radiology Studies: DG Chest 2 View  Result Date: 03/17/2022 CLINICAL DATA:  Chest pain. EXAM: CHEST - 2 VIEW COMPARISON:  December 22, 2021. FINDINGS: The heart size and mediastinal contours are within normal limits. Both lungs are clear. Status post bilateral shoulder arthroplasties. IMPRESSION: No active cardiopulmonary disease. Electronically Signed   By: Marijo Conception M.D.   On: 03/17/2022 18:59       Hosie Poisson M.D. [facility] 03/19/2022, 1:16 PM  Available via Epic secure chat 7am-7pm After 7 pm, please refer to night coverage provider listed on amion.

## 2022-03-19 NOTE — TOC Initial Note (Signed)
Transition of Care [facility]) - Initial/Assessment Note    Patient Details  Name: Amy Hickman MRN: [id number] Date of Birth: 1935-07-26  Transition of Care [facility]) CM/SW Contact:    Leeroy Cha, RN Phone Number: 03/19/2022, 7:36 AM  Clinical Narrative:                   Transition of Care [facility]) Screening Note   Patient Details  Name: Amy Hickman Date of Birth: 1936-04-01   Transition of Care [facility]) CM/SW Contact:    Leeroy Cha, RN Phone Number: 03/19/2022, 7:36 AM    Transition of Care Department [facility]) has reviewed patient and no TOC needs have been identified at this time. We will continue to monitor patient advancement through interdisciplinary progression rounds. If new patient transition needs arise, please place a TOC consult.   Expected Discharge Plan: Home/Self Care Barriers to Discharge: Continued Medical Work up   Patient Goals and CMS Choice Patient states their goals for this hospitalization and ongoing recovery are:: to go home CMS Medicare.gov Compare Post Acute Care list provided to:: Patient    Expected Discharge Plan and Services Expected Discharge Plan: Home/Self Care   Discharge Planning Services: CM Consult   Living arrangements for the past 2 months: Single Family Home                                      Prior Living Arrangements/Services Living arrangements for the past 2 months: Single Family Home Lives with:: Self Patient language and need for interpreter reviewed:: Yes Do you feel safe going back to the place where you live?: Yes            Criminal Activity/Legal Involvement Pertinent to Current Situation/Hospitalization: No - Comment as needed  Activities of Daily Living Home Assistive Devices/Equipment: Cane (specify quad or straight) ADL Screening (condition at time of admission) Patient's cognitive ability adequate to safely complete daily activities?: Yes Is the patient deaf or have difficulty  hearing?: Yes Does the patient have difficulty seeing, even when wearing glasses/contacts?: No Does the patient have difficulty concentrating, remembering, or making decisions?: No Patient able to express need for assistance with ADLs?: Yes Does the patient have difficulty dressing or bathing?: Yes Independently performs ADLs?: Yes (appropriate for developmental age) Does the patient have difficulty walking or climbing stairs?: Yes Weakness of Legs: Both Weakness of Arms/Hands: None  Permission Sought/Granted                  Emotional Assessment Appearance:: Appears stated age Attitude/Demeanor/Rapport: Engaged Affect (typically observed): Calm Orientation: : Oriented to Self, Oriented to Place, Oriented to  Time, Oriented to Situation Alcohol / Substance Use: Tobacco Use (former smoker quit 42 years ago) Psych Involvement: No (comment)  Admission diagnosis:  Precordial chest pain [R07.2] Chest discomfort [R07.89] Elevated blood pressure reading [R03.0] Atrial fibrillation with rapid ventricular response ([place]) [I48.91] Essential hypertension [I10] Atrial fibrillation with RVR ([place]) [I48.91] Patient Active Problem List   Diagnosis Date Noted   Fracture of right ulna 03/18/2022   Atrial fibrillation with rapid ventricular response ([place]) 03/17/2022   Elevated LFTs 02/05/2022   Malnutrition of moderate degree 12/24/2021   Chest pain 12/23/2021   Atrial fibrillation, chronic ([place]) 12/23/2021   CKD (chronic kidney disease) stage 4, GFR 15-29 ml/min ([place]) 12/23/2021   DNR (do not resuscitate) 12/23/2021   Celiac disease 12/23/2021   Interstitial  cystitis 12/23/2021   Secondary hypercoagulable state ([place]) 08/11/2021   Atrial flutter (H[facility])    NICM (nonischemic cardiomyopathy) ([place]) 05/30/2021   Venous insufficiency 05/30/2021   Persistent atrial fibrillation ([place]) 54/12/8117   Chronic systolic CHF (congestive heart failure) ([place]) 03/17/2021   Biatrial enlargement 03/17/2021    Sepsis ([place]) 03/16/2021   GERD (gastroesophageal reflux disease)    History of DVT (deep vein thrombosis)    Influenza A 03/15/2021   Anemia 03/15/2021   Atrial fibrillation with RVR ([place]) 03/15/2021   COPD with acute exacerbation ([place]) 03/15/2021   Dark stools 03/15/2021   Hyponatremia 03/15/2021   Hypothyroidism 03/15/2021   Coronary artery disease involving native coronary artery of native heart without angina pectoris 03/15/2021   Essential hypertension 03/15/2021   Hyperlipidemia 03/15/2021   Hyperparathyroidism ([place]) 12/12/2020   Depression 01/03/2019   History of pericarditis 08/06/2016   Generalized osteoarthritis of multiple sites 08/19/2015   Age-related osteoporosis without current pathological fracture 08/14/2015   DVT (deep venous thrombosis) ([place]) 08/14/2015   History of GI bleed 06/15/2014   PCP:  [facility] Pharmacy:   [place], [place] [address] Phone: [phone] Fax: [phone]     Social Determinants of Health (SDOH) Interventions    Readmission Risk Interventions   No data to display

## 2022-03-19 NOTE — Evaluation (Signed)
Physical Therapy Evaluation Patient Details Name: Amy Hickman MRN: [id number] DOB: 11-22-1935 Today's Date: 03/19/2022  History of Present Illness  Patient is 86 y.o. female presented to the emergency department due to substernal chest pain that feels pressure-like and associated with nausea and mild dyspnea. Pt admitted for Afib with RVR. PMH significant for GERD, chronic systolic CHF, impaired hearing, hypertension, interstitial's otitis, osteoporosis impaired fasting glucose, hypertensive retinopathy, glaucoma, history of GI bleed, GERD, celiac disease, hyperlipidemia, hyponatremia, hypothyroidism, history of DVT, osteoarthritis of multiple sites, COPD, CKD, depression, elevated LFTs, aortic valve stenosis, CAD, chronic atrial fibrillation.    Clinical Impression  Amy Hickman is 86 y.o. female admitted with above HPI and diagnosis. Patient is currently limited by functional impairments below (see PT problem list). Patient lives with her granddaughter and is independent at baseline with no device however family has tried to encourage use of SPC due to history of falling. Today pt required min guard for transfers and gait, HHA improved pt's stability with ambulation and educated pt on use of cane or RW to improve balance. Patient will benefit from continued skilled PT interventions to address impairments and progress independence with mobility, recommending OPPT follow up for balance training. Acute PT will follow and progress as able.        Recommendations for follow up therapy are one component of a multi-disciplinary discharge planning process, led by the attending physician.  Recommendations may be updated based on patient status, additional functional criteria and insurance authorization.  Follow Up Recommendations Outpatient PT      Assistance Recommended at Discharge Intermittent Supervision/Assistance  Patient can return home with the following  A little help with walking  and/or transfers;A little help with bathing/dressing/bathroom;Assistance with cooking/housework;Assist for transportation;Help with stairs or ramp for entrance    Equipment Recommendations None recommended by PT  Recommendations for Other Services       Functional Status Assessment Patient has had a recent decline in their functional status and demonstrates the ability to make significant improvements in function in a reasonable and predictable amount of time.     Precautions / Restrictions Precautions Precautions: Fall Precaution Comments: 2 falls at front step Restrictions Weight Bearing Restrictions: No      Mobility  Bed Mobility Overal bed mobility: Modified Independent             General bed mobility comments: HOB slightly elevated    Transfers Overall transfer level: Needs assistance Equipment used: None Transfers: Sit to/from Stand Sit to Stand: Min guard, Supervision           General transfer comment: guard/sup for safety for sit<>stand from EOB    Ambulation/Gait Ambulation/Gait assistance: Min assist, Min guard Gait Distance (Feet): 2030 Feet Assistive device: 1 person hand held assist, None Gait Pattern/deviations: Step-through pattern, Decreased step length - right, Decreased step length - left, Decreased stride length, Narrow base of support (low hip flexion) Gait velocity: decr        Stairs            Wheelchair Mobility    Modified Rankin (Stroke Patients Only)       Balance Overall balance assessment: Mild deficits observed, not formally tested, History of Falls                                           Pertinent Vitals/Pain Pain Assessment Pain Assessment:  No/denies pain    Home Living Family/patient expects to be discharged to:: Private residence Living Arrangements: Other relatives (granddaughter lives with her) Available Help at Discharge: Family;Available PRN/intermittently Type of Home:  House Home Access: Stairs to enter Entrance Stairs-Rails: None Entrance Stairs-Number of Steps: 1 (through the basement/back) Alternate Level Stairs-Number of Steps: Chair lift between floors which pt uses consistently Home Layout: Multi-level (finished basement) Home Equipment: Cane - single point;Rollator (4 wheels);Electric scooter      Prior Function Prior Level of Function : Independent/Modified Independent             Mobility Comments: independent without DME; denies falls in the past few months. does not drive due to macular degeneration. very active with senior group at [employer] ADLs Comments: independent with dressing and bathing     Hand Dominance   Dominant Hand: Right    Extremity/Trunk Assessment   Upper Extremity Assessment Upper Extremity Assessment: Overall WFL for tasks assessed    Lower Extremity Assessment Lower Extremity Assessment: Overall WFL for tasks assessed    Cervical / Trunk Assessment Cervical / Trunk Assessment: Normal  Communication   Communication: HOH  Cognition Arousal/Alertness: Awake/alert Behavior During Therapy: WFL for tasks assessed/performed Overall Cognitive Status: Within Functional Limits for tasks assessed                                          General Comments      Exercises     Assessment/Plan    PT Assessment Patient needs continued PT services  PT Problem List Decreased knowledge of use of DME;Decreased mobility;Decreased balance;Decreased activity tolerance;Decreased strength       PT Treatment Interventions DME instruction;Gait training;Stair training;Functional mobility training;Therapeutic activities;Therapeutic exercise;Balance training;Neuromuscular re-education;Patient/family education    PT Goals (Current goals can be found in the Care Plan section)  Acute Rehab PT Goals Patient Stated Goal: get home PT Goal Formulation: With patient Time For Goal Achievement: 04/02/22 Potential  to Achieve Goals: Good    Frequency Min 3X/week     Co-evaluation               AM-PAC PT "6 Clicks" Mobility  Outcome Measure Help needed turning from your back to your side while in a flat bed without using bedrails?: None Help needed moving from lying on your back to sitting on the side of a flat bed without using bedrails?: None Help needed moving to and from a bed to a chair (including a wheelchair)?: A Little Help needed standing up from a chair using your arms (e.g., wheelchair or bedside chair)?: A Little Help needed to walk in hospital room?: A Little Help needed climbing 3-5 steps with a railing? : A Little 6 Click Score: 20    End of Session Equipment Utilized During Treatment: Gait belt Activity Tolerance: Patient tolerated treatment well Patient left: with call bell/phone within reach;in bed;with family/visitor present Nurse Communication: Mobility status PT Visit Diagnosis: Unsteadiness on feet (R26.81);History of falling (Z91.81)    Time: 7510-2585 PT Time Calculation (min) (ACUTE ONLY): 18 min   Charges:   PT Evaluation $PT Eval Low Complexity: 1 Low          Verner Mould, DPT Acute Rehabilitation Services Office [phone]  03/19/22 12:56 PM

## 2022-03-19 NOTE — Consult Note (Addendum)
Cardiology Consultation   Patient ID: [name] MRN: [id number]; DOB: [birth year]  Admit date: 03/17/2022 Date of Consult: 03/19/2022  PCP:  [facility] [place] [facility] Cardiologist:  Buford Dresser, MD        Patient Profile:   Amy Hickman is a 86 y.o. female with a hx of nonobstructive CAD, COPD, OSA, CKD stage III/IV followed by Dr. Cay Schillings of [facility], recurrent UTI, HTN, persistent atrial fibrillation on amiodarone, celiac disease and a history of DVT s/p IVC filter who is being seen 03/19/2022 for the evaluation of atrial flutter and chest pain at the request of Dr. Karleen Hampshire.  History of Present Illness:   Amy Hickman is a 86 year old female with past medical history of nonobstructive CAD, COPD, OSA, CKD stage III/IV followed by Dr. Cay Schillings of [place], recurrent UTI, HTN, persistent atrial fibrillation on amiodarone, celiac disease and a history of DVT s/p IVC filter.  Echocardiogram in March 2022 showed EF 50 to 55%, moderate aortic stenosis.  She underwent left and right heart cath on 08/09/2020 that showed mild aortic stenosis with mean gradient 8 mmHg, mild pulmonary hypertension, normal cardiac output, 25% ostial to proximal LAD, 60% mid LAD, 60% D1, 20% OM 2 and 40% proximal to mid RCA lesion. IFR of the mid LAD and D1 were negative.  Patient was admitted in November 2022 with influenza A and was found to be in atrial flutter with RVR. Heart rate was difficult to control, patient required both metoprolol and diltiazem.  Echocardiogram obtained on 03/16/2021 showed EF 40 to 45%, mild to moderate MR, moderate TR, moderate aortic stenosis.  She eventually underwent cardioversion on 04/24/2021 that converted her to normal sinus rhythm, however she returned back into A-fib.  She was started on amiodarone therapy and underwent repeat DCCV 06/12/2021 with restoration of normal sinus rhythm.  By the time she  followed up by A-fib clinic in March 2023, she was noted to be in rate controlled atrial flutter.  She underwent another DCCV on 08/11/2021.  Patient was evaluated in the hospital in September 2023 with chest pain.  EKG at the time showed normal sinus rhythm.  Echocardiogram obtained on 12/24/2021 continue to show EF 40 to 45%, moderate AI/AS.  Hospital course complicated by AKI, Lasix and losartan were held.  Renal ultrasound negative for hydronephrosis, creatinine trended up to 2 before trending back down.  Eliquis was reduced to 2.5 mg twice a day.  Patient has been seen by Dr. Harrell Gave since her last hospitalization, hydralazine was increased to 75 mg 3 times a day for better blood pressure control.  She was in her usual state of health until 03/16/2022 when she started having substernal chest discomfort.  At the same time her Apple Watch alerted her of high heart rate.  She contacted cardiology service on 11/28 and was instructed to seek medical attention at local ED.  She went to [facility] and was noted to be in atrial flutter with RVR. Hs serial troponin 11--> 12--> 11.  Chest x-ray normal.  She was kept on home metoprolol succinate 100 mg every night and home diltiazem was switched to IV.  She self converted back to normal sinus rhythm in the morning of 03/19/2022.  Cardiology service was asked to evaluate her A-fib/atrial flutter.   Past Medical History:  Diagnosis Date   Atrial fibrillation, chronic (HCC)    s/p multiple cardioversions with sustained NSR, on Eliquis  Celiac disease    Chronic systolic CHF (congestive heart failure) (HCC)    Coronary artery disease involving native coronary artery of native heart without angina pectoris 03/15/2021   GERD (gastroesophageal reflux disease)    HOH (hard of hearing)    Hyperparathyroidism ([place]) 12/12/2020   Hypertension    Interstitial cystitis     Past Surgical History:  Procedure Laterality Date   ABDOMINAL HYSTERECTOMY      APPENDECTOMY     CARDIOVERSION N/A 04/24/2021   Procedure: CARDIOVERSION;  Surgeon: Jerline Pain, MD;  Location: [facility] ENDOSCOPY;  Service: Cardiovascular;  Laterality: N/A;   CARDIOVERSION N/A 06/12/2021   Procedure: CARDIOVERSION;  Surgeon: Buford Dresser, MD;  Location: [facility] ENDOSCOPY;  Service: Cardiovascular;  Laterality: N/A;   CARDIOVERSION N/A 08/11/2021   Procedure: CARDIOVERSION;  Surgeon: [name], MD;  Location: [facility] ENDOSCOPY;  Service: Cardiovascular;  Laterality: N/A;   CHOLECYSTECTOMY     ear drum surgery     REPLACEMENT TOTAL KNEE     ROTATOR CUFF REPAIR Right    TONSILLECTOMY       Home Medications:  Prior to Admission medications   Medication Sig Start Date End Date Taking? Authorizing Provider  acetaminophen (TYLENOL) 500 MG tablet Take 1 tablet (500 mg total) by mouth every 6 (six) hours as needed for pain. Patient taking differently: Take 1,000 mg by mouth every 6 (six) hours as needed for pain or moderate pain. 06/24/12  Yes [name], MD  amiodarone (PACERONE) 200 MG tablet Take 1 tablet (200 mg total) by mouth daily. 03/05/22  Yes Sherran Needs, NP  apixaban (ELIQUIS) 2.5 MG TABS tablet TAKE 1 TABLET TWICE A DAY (DOSE DECREASE) Patient taking differently: Take 2.5 mg by mouth 2 (two) times daily. 02/25/22  Yes Buford Dresser, MD  B Complex Vitamins (VITAMIN B COMPLEX) TABS Take 1 tablet by mouth daily.   Yes [provider]  cefdinir (OMNICEF) 300 MG capsule Take 300 mg by mouth daily. 01/01/22  Yes [provider]  clidinium-chlordiazePOXIDE (LIBRAX) 5-2.5 MG capsule Take 2 capsules by mouth daily as needed (cramping). 02/28/21  Yes [provider]  CRANBERRY-VITAMIN C PO Take 1 capsule by mouth in the morning.   Yes [provider]  diltiazem (CARDIZEM CD) 360 MG 24 hr capsule Take 1 capsule (360 mg total) by mouth daily. 06/12/21  Yes Buford Dresser, MD  esomeprazole (NEXIUM) 40 MG capsule Take  40 mg by mouth daily before breakfast.   Yes [provider]  hydrALAZINE (APRESOLINE) 50 MG tablet Take 50 mg by mouth as directed. Take 1 and 1/2 tablets three times a day   Yes [provider]  Hyoscyamine Sulfate SL 0.125 MG SUBL Place 0.125 mg under the tongue every 4 (four) hours as needed (cramping). 04/24/21  Yes [provider]  isosorbide dinitrate (ISORDIL) 10 MG tablet Take 10 mg by mouth 2 (two) times daily. 12/31/21  Yes [provider]  levothyroxine (SYNTHROID) 100 MCG tablet Take 100 mcg by mouth daily before breakfast. 02/09/22  Yes [provider]  metoprolol (TOPROL-XL) 100 MG 24 hr tablet Take 1 tablet (100 mg total) by mouth daily. Take with or immediately following a meal. Patient taking differently: Take 100 mg by mouth every evening. 08/11/21 08/06/22 Yes Sherran Needs, NP  Netarsudil-Latanoprost (ROCKLATAN) 0.02-0.005 % SOLN Place 1 drop into both eyes at bedtime. 12/30/21  Yes [provider]  pilocarpine (PILOCAR) 1 % ophthalmic solution Place 1 drop into the left  eye 2 (two) times daily. 02/13/21  Yes [provider]  polyvinyl alcohol (ARTIFICIAL TEARS) 1.4 % ophthalmic solution Place 1 drop into both eyes as needed for dry eyes.   Yes [provider]  Vitamin D, Ergocalciferol, (DRISDOL) 1.25 MG (50000 UNIT) CAPS capsule Take 50,000 Units by mouth every 7 (seven) days. 05/22/21  Yes [provider]    Inpatient Medications: Scheduled Meds:  amiodarone  200 mg Oral Daily   apixaban  2.5 mg Oral BID   cefdinir  300 mg Oral Daily   levothyroxine  100 mcg Oral QAC breakfast   metoprolol succinate  100 mg Oral QPM   Netarsudil-Latanoprost  1 drop Ophthalmic QHS   pantoprazole  40 mg Oral Daily   pilocarpine  1 drop Left Eye BID   Continuous Infusions:  sodium chloride 10 mL/hr at 03/19/22 0600   diltiazem (CARDIZEM) infusion 5 mg/hr (03/19/22 0600)   PRN Meds: sodium chloride,  acetaminophen **OR** acetaminophen, clidinium-chlordiazePOXIDE, fentaNYL (SUBLIMAZE) injection, hyoscyamine, ondansetron **OR** ondansetron (ZOFRAN) IV, polyvinyl alcohol  Allergies:    Allergies  Allergen Reactions   Ativan [Lorazepam] Other (See Comments)    "Knocked her out, woke up 3 days later"   Augmentin [Amoxicillin-Pot Clavulanate] Nausea And Vomiting   Avelox [Moxifloxacin] Itching and Nausea Only   Brimonidine Itching and Swelling    Other reaction(s): Other (See Comments) redness   Chlorzoxazone Other (See Comments)    Went crazy/loopy   Ciprofloxacin Hcl Itching and Nausea Only   Clavulanic Acid     Other reaction(s): Unknown   Crestor [Rosuvastatin] Other (See Comments)    myalgia   Ezetimibe Diarrhea    Other reaction(s): Other (See Comments) Stomach cramps   Gluten Meal Other (See Comments)    Diarrhea, hot/cold sweat, will sleep for a couple of hours - Celiac disease   Nitrofuran Derivatives Itching and Nausea Only   Phenergan [Promethazine Hcl] Other (See Comments)    hallucinations   Wheat Bran     Other reaction(s): Other (See Comments) Gi distress- celiac disease   Codeine Palpitations   Darvon [Propoxyphene Hcl] Palpitations   Doxycycline Itching and Nausea Only    Social History:   Social History   Socioeconomic History   Marital status: Widowed    Spouse name: Not on file   Number of children: Not on file   Years of education: Not on file   Highest education level: Not on file  Occupational History   Occupation: retired  Tobacco Use   Smoking status: Former    Packs/day: 2.00    Years: 33.00    Total pack years: 66.00    Types: Cigarettes    Quit date: 1981    Years since quitting: 42.9   Smokeless tobacco: Never  Substance and Sexual Activity   Alcohol use: Not Currently    Comment: rare   Drug use: No   Sexual activity: Not on file  Other Topics Concern   Not on file  Social History Narrative   Not on file   Social  Determinants of Health   Financial Resource Strain: Not on file  Food Insecurity: No Food Insecurity (03/18/2022)   Hunger Vital Sign    Worried About Running Out of Food in the Last Year: Never true    Ran Out of Food in the Last Year: Never true  Transportation Needs: No Transportation Needs (03/18/2022)   PRAPARE - Transportation    Lack of Transportation (Medical): No    Lack of  Transportation (Non-Medical): No  Physical Activity: Not on file  Stress: Not on file  Social Connections: Not on file  Intimate Partner Violence: Not At Risk (03/18/2022)   Humiliation, Afraid, Rape, and Kick questionnaire    Fear of Current or Ex-Partner: No    Emotionally Abused: No    Physically Abused: No    Sexually Abused: No    Family History:    Family History  Problem Relation Age of Onset   Hypertension Other      ROS:  Please see the history of present illness.   All other ROS reviewed and negative.     Physical Exam/Data:   Vitals:   03/18/22 2151 03/19/22 0004 03/19/22 0030 03/19/22 0418  BP:   (!) 152/95 (!) 155/82  Pulse:   95 83  Resp: 17 15 18    Temp:   98.2 F (36.8 C) 98.3 F (36.8 C)  TempSrc:   Oral Oral  SpO2:   94% 93%  Weight:      Height:        Intake/Output Summary (Last 24 hours) at 03/19/2022 0920 Last data filed at 03/19/2022 0600 Gross per 24 hour  Intake 269.22 ml  Output 550 ml  Net -280.78 ml      03/17/2022    5:57 PM 02/20/2022    1:33 PM 01/05/2022    2:24 PM  Last 3 Weights  Weight (lbs) 143 lb 151 lb 14.4 oz 147 lb 9.6 oz  Weight (kg) 64.864 kg 68.901 kg 66.951 kg     Body mass index is 27.93 kg/m.  General:  Well nourished, well developed, in no acute distress HEENT: normal Neck: no JVD Vascular: No carotid bruits; Distal pulses 2+ bilaterally Cardiac:  normal S1, S2; RRR; no murmur  Lungs:  clear to auscultation bilaterally, no wheezing, rhonchi or rales  Abd: soft, nontender, no hepatomegaly  Ext: no edema Musculoskeletal:   No deformities, BUE and BLE strength normal and equal Skin: warm and dry  Neuro:  CNs 2-12 intact, no focal abnormalities noted Psych:  Normal affect   EKG:  The EKG was personally reviewed and demonstrates: Atrial flutter with RVR Telemetry:  Telemetry was personally reviewed and demonstrates: Atrial flutter, converted to normal sinus rhythm around 4:13 AM on 03/19/2022  Relevant CV Studies:  L&RHC 08/09/2020  DIAGNOSTIC FINDINGS:    1.  Mild aortic stenosis.  Mean gradient 8 mmHg.  2.  Borderline coronary artery stenosis involving the LAD and first  diagonal.  The IFR was measured in both the LAD and the diagonal  separately.  The IFR was unremarkable, consistent with no impairment of  coronary blood flow to the segments.  Therefore, coronary artery  intervention was not currently indicated.  3.  Mild pulmonary hypertension, accompanied by mildly elevated right  atrial and left atrial filling pressures.  This is likely manifestation of  salt/volume overload.  4.  Normal cardiac output.   Echo 12/24/2021  1. Left ventricular ejection fraction, by estimation, is 40 to 45%. The  left ventricle has mildly decreased function. The left ventricle  demonstrates global hypokinesis. There is mild concentric left ventricular  hypertrophy. Left ventricular diastolic  parameters are indeterminate.   2. Right ventricular systolic function is normal. The right ventricular  size is normal.   3. Left atrial size was moderately dilated.   4. The mitral valve is normal in structure. No evidence of mitral valve  regurgitation. No evidence of mitral stenosis.   5. The Aortic  valve is thickened and moderately calcified. . There is  mild calcification of the aortic valve. There is mild thickening of the  aortic valve. Aortic valve regurgitation is moderate. Suspect low flow low  gradient moderate aortic stenosis.  Aortic valve area, by VTI measures 1.09 cm. Aortic valve mean gradient  measures 11.0  mmHg. Aortic valve Vmax measures 2.02 m/s.   6. The inferior vena cava is normal in size with greater than 50%  respiratory variability, suggesting right atrial pressure of 3 mmHg.   Comparison(s): No significant change from prior study.   Laboratory Data:  High Sensitivity Troponin:   Recent Labs  Lab 03/17/22 1810 03/17/22 2045 03/18/22 0711 03/18/22 0901  TROPONINIHS 11 12 11 10      Chemistry Recent Labs  Lab 03/17/22 1810 03/18/22 1325 03/19/22 0343  NA 138 139 136  K 3.9 4.1 4.3  CL 103 108 107  CO2 23 22 22   GLUCOSE 105* 92 103*  BUN 20 12 15   CREATININE 1.10* 0.95 0.95  CALCIUM 9.6 9.1 8.9  MG  --  2.2  --   GFRNONAA 50* 59* 59*  ANIONGAP 12 9 7     Recent Labs  Lab 03/18/22 1325  PROT 6.5  ALBUMIN 3.8  AST 40  ALT 36  ALKPHOS 65  BILITOT 0.7   Lipids No results for input(s): "CHOL", "TRIG", "HDL", "LABVLDL", "LDLCALC", "CHOLHDL" in the last 168 hours.  Hematology Recent Labs  Lab 03/17/22 1810 03/18/22 1325  WBC 6.1 6.4  RBC 3.83* 4.04  HGB 12.0 12.5  HCT 37.6 40.8  MCV 98.2 101.0*  MCH 31.3 30.9  MCHC 31.9 30.6  RDW 15.8* 15.7*  PLT 199 184   Thyroid  Recent Labs  Lab 03/18/22 1325  TSH 10.620*    BNP Recent Labs  Lab 03/18/22 1325  BNP 187.8*    DDimer No results for input(s): "DDIMER" in the last 168 hours.   Radiology/Studies:  DG Chest 2 View  Result Date: 03/17/2022 CLINICAL DATA:  Chest pain. EXAM: CHEST - 2 VIEW COMPARISON:  December 22, 2021. FINDINGS: The heart size and mediastinal contours are within normal limits. Both lungs are clear. Status post bilateral shoulder arthroplasties. IMPRESSION: No active cardiopulmonary disease. Electronically Signed   By: Marijo Conception M.D.   On: 03/17/2022 18:59     Assessment and Plan:   Atrial flutter with RVR: History of both atrial fibrillation and atrial flutter.  She underwent cardioversion in January 2023, due to recurrence of A-fib, she was started on amiodarone and  underwent another cardioversion in February.  Last cardioversion was 08/11/2021 for atrial flutter.  She had onset of chest pain and tachycardia on Monday 11/27.  She self converted on IV diltiazem and the home dose of p.o. metoprolol succinate.  Will switch IV diltiazem back to home dose of 360 mg of Cardizem CD.  Chest pain resolved after she self converted.  -Technically, patient qualify for 5 mg twice a day of Eliquis instead of 2.5 mg.  Chest pain: Started on Monday at the same time as a fast heart rate.  Patient has a history of nonobstructive CAD.  Once converted back to oral Cardizem, recommend to ambulate the patient, if no chest pain with exertion, patient can be discharged.  CAD: Previous left and right heart cath performed on 08/09/2020 showed mild aortic stenosis, 25% ostial to proximal LAD, 60% mid LAD, 60% D1, 20% OM 2 and the 40% proximal to mid RCA.  Mid LAD and  D1 lesion had a negative IFR.   COPD: No acute exacerbation  History of CKD stage III/IV: Consider increase home Eliquis to 5 mg twice a day.  Renal function significantly improved after home Lasix and losartan were discontinued to last admission.    Risk Assessment/Risk Scores:          CHA2DS2-VASc Score = 6   This indicates a 9.7% annual risk of stroke. The patient's score is based upon: CHF History: 1 HTN History: 1 Diabetes History: 0 Stroke History: 0 Vascular Disease History: 1 Age Score: 2 Gender Score: 1         For questions or updates, please contact [place] Please consult [email or web address] for contact info under    Signed, Almyra Deforest, [place]  03/19/2022 9:20 AM  Patient seen and examined with Almyra Deforest PA.  Agree as above, with the following exceptions and changes as noted below. 86 yo female with history of atrial fib/flutter, with recurrence prompting hospital presentation, seen with family at the bedside. Gen: NAD, CV: RRR, 2/6 systolic ejection murmur with preserved S2, midpeaking,  Lungs: clear, Abd: soft, Extrem: Warm, well perfused, no edema, Neuro/Psych: alert and oriented x 3, normal mood and affect. All available labs, radiology testing, previous records reviewed.  86 year old female presents with atrial flutter initially with rapid rate, controlled with IV diltiazem, now appropriate for transition back to her home medications metoprolol and diltiazem.  She is feeling well and is having no chest pain while in normal sinus rhythm.  She does have a mid peaking systolic ejection murmur, and may have some component of aortic valve stenosis.  The valve visually appears abnormal but the mean gradient obtained on echo was not significantly elevated.  This may need to be evaluated further on an outpatient basis with her primary cardiologist Dr. Harrell Gave.  Could consider CT aortic valve calcium scoring.  Ejection fraction is likely 45% which is not felt to have significantly changed.    Cardiovascular work-up can continue as an outpatient.  Remainder as above.    Elouise Munroe, MD 03/19/22 1:48 PM

## 2022-03-19 NOTE — Plan of Care (Signed)
  Problem: Education: Goal: Knowledge of General Education information will improve Description Including pain rating scale, medication(s)/side effects and non-pharmacologic comfort measures Outcome: Progressing   Problem: Health Behavior/Discharge Planning: Goal: Ability to manage health-related needs will improve Outcome: Progressing   

## 2022-03-19 NOTE — Plan of Care (Signed)

## 2022-03-20 ENCOUNTER — Inpatient Hospital Stay (HOSPITAL_COMMUNITY): Payer: Medicare Other

## 2022-03-20 DIAGNOSIS — I10 Essential (primary) hypertension: Secondary | ICD-10-CM | POA: Diagnosis not present

## 2022-03-20 DIAGNOSIS — I4891 Unspecified atrial fibrillation: Secondary | ICD-10-CM | POA: Diagnosis not present

## 2022-03-20 DIAGNOSIS — F32A Depression, unspecified: Secondary | ICD-10-CM | POA: Diagnosis present

## 2022-03-20 DIAGNOSIS — H35039 Hypertensive retinopathy, unspecified eye: Secondary | ICD-10-CM | POA: Diagnosis present

## 2022-03-20 DIAGNOSIS — I130 Hypertensive heart and chronic kidney disease with heart failure and stage 1 through stage 4 chronic kidney disease, or unspecified chronic kidney disease: Secondary | ICD-10-CM | POA: Diagnosis present

## 2022-03-20 DIAGNOSIS — I4892 Unspecified atrial flutter: Secondary | ICD-10-CM | POA: Diagnosis present

## 2022-03-20 DIAGNOSIS — N1831 Chronic kidney disease, stage 3a: Secondary | ICD-10-CM | POA: Diagnosis present

## 2022-03-20 DIAGNOSIS — I352 Nonrheumatic aortic (valve) stenosis with insufficiency: Secondary | ICD-10-CM | POA: Diagnosis present

## 2022-03-20 DIAGNOSIS — I5022 Chronic systolic (congestive) heart failure: Secondary | ICD-10-CM | POA: Diagnosis present

## 2022-03-20 DIAGNOSIS — I480 Paroxysmal atrial fibrillation: Secondary | ICD-10-CM | POA: Diagnosis present

## 2022-03-20 DIAGNOSIS — M79671 Pain in right foot: Secondary | ICD-10-CM | POA: Diagnosis not present

## 2022-03-20 DIAGNOSIS — E1122 Type 2 diabetes mellitus with diabetic chronic kidney disease: Secondary | ICD-10-CM | POA: Diagnosis present

## 2022-03-20 DIAGNOSIS — E785 Hyperlipidemia, unspecified: Secondary | ICD-10-CM | POA: Diagnosis present

## 2022-03-20 DIAGNOSIS — M810 Age-related osteoporosis without current pathological fracture: Secondary | ICD-10-CM | POA: Diagnosis present

## 2022-03-20 DIAGNOSIS — E039 Hypothyroidism, unspecified: Secondary | ICD-10-CM | POA: Diagnosis present

## 2022-03-20 DIAGNOSIS — S92344A Nondisplaced fracture of fourth metatarsal bone, right foot, initial encounter for closed fracture: Secondary | ICD-10-CM | POA: Diagnosis not present

## 2022-03-20 DIAGNOSIS — Z79899 Other long term (current) drug therapy: Secondary | ICD-10-CM | POA: Diagnosis not present

## 2022-03-20 DIAGNOSIS — R072 Precordial pain: Secondary | ICD-10-CM | POA: Diagnosis present

## 2022-03-20 DIAGNOSIS — Z95828 Presence of other vascular implants and grafts: Secondary | ICD-10-CM | POA: Diagnosis not present

## 2022-03-20 DIAGNOSIS — E782 Mixed hyperlipidemia: Secondary | ICD-10-CM | POA: Diagnosis not present

## 2022-03-20 DIAGNOSIS — X58XXXA Exposure to other specified factors, initial encounter: Secondary | ICD-10-CM | POA: Diagnosis not present

## 2022-03-20 DIAGNOSIS — E213 Hyperparathyroidism, unspecified: Secondary | ICD-10-CM | POA: Diagnosis present

## 2022-03-20 DIAGNOSIS — I4819 Other persistent atrial fibrillation: Secondary | ICD-10-CM | POA: Diagnosis present

## 2022-03-20 DIAGNOSIS — Z86718 Personal history of other venous thrombosis and embolism: Secondary | ICD-10-CM | POA: Diagnosis not present

## 2022-03-20 DIAGNOSIS — I272 Pulmonary hypertension, unspecified: Secondary | ICD-10-CM | POA: Diagnosis present

## 2022-03-20 DIAGNOSIS — J449 Chronic obstructive pulmonary disease, unspecified: Secondary | ICD-10-CM | POA: Diagnosis present

## 2022-03-20 DIAGNOSIS — K219 Gastro-esophageal reflux disease without esophagitis: Secondary | ICD-10-CM | POA: Diagnosis present

## 2022-03-20 DIAGNOSIS — I2510 Atherosclerotic heart disease of native coronary artery without angina pectoris: Secondary | ICD-10-CM | POA: Diagnosis present

## 2022-03-20 DIAGNOSIS — Z66 Do not resuscitate: Secondary | ICD-10-CM | POA: Diagnosis present

## 2022-03-20 DIAGNOSIS — Z87891 Personal history of nicotine dependence: Secondary | ICD-10-CM | POA: Diagnosis not present

## 2022-03-20 LAB — T3, FREE: T3, Free: 1.2 pg/mL — ABNORMAL LOW (ref 2.0–4.4)

## 2022-03-20 MED ORDER — TRAMADOL HCL 50 MG PO TABS: 50.0000 mg | ORAL_TABLET | Freq: Once | ORAL | Status: DC

## 2022-03-20 MED ORDER — SENNOSIDES-DOCUSATE SODIUM 8.6-50 MG PO TABS
2.0000 | ORAL_TABLET | Freq: Two times a day (BID) | ORAL | Status: DC
  Administered 2022-03-20 – 2022-03-22 (×5): 2 via ORAL
  Filled 2022-03-20 (×5): qty 2

## 2022-03-20 MED ORDER — POLYETHYLENE GLYCOL 3350 17 G PO PACK
17.0000 g | PACK | Freq: Every day | ORAL | Status: DC
  Administered 2022-03-20 – 2022-03-22 (×3): 17 g via ORAL
  Filled 2022-03-20 (×3): qty 1

## 2022-03-20 MED ORDER — COLCHICINE 0.6 MG PO TABS
0.6000 mg | ORAL_TABLET | Freq: Two times a day (BID) | ORAL | Status: DC
  Administered 2022-03-20 – 2022-03-21 (×3): 0.6 mg via ORAL
  Filled 2022-03-20 (×3): qty 1

## 2022-03-20 NOTE — Plan of Care (Signed)
  Problem: Education: Goal: Knowledge of General Education information will improve Description Including pain rating scale, medication(s)/side effects and non-pharmacologic comfort measures Outcome: Progressing   Problem: Health Behavior/Discharge Planning: Goal: Ability to manage health-related needs will improve Outcome: Progressing   

## 2022-03-20 NOTE — Progress Notes (Addendum)
[facility]                                                                               Amy Hickman, is a 86 y.o. female, DOB - [birth year], [place] date - 03/17/2022    Outpatient Primary MD for the patient is [facility]  LOS - 1  days    Brief summary    Amy Hickman is a 86 y.o. female with medical history significant of GERD, chronic systolic CHF, impaired hearing, hypertension, interstitial's otitis, osteoporosis impaired fasting glucose, hypertensive retinopathy, glaucoma, history of GI bleed, GERD, celiac disease, hyperlipidemia, hyponatremia, hypothyroidism, history of DVT, osteoarthritis of multiple sites, COPD, CKD, depression, elevated LFTs, aortic valve stenosis, CAD, chronic atrial fibrillation who is coming to the emergency department due to substernal chest pain that feels pressure-like and associated with nausea and mild dyspnea. She was admitted for atrial fib with RVR.    Assessment & Plan    Assessment and Plan: Atrial fibrillation with RVR Rate controlled on IV Cardizem transition to oral diltiazem 360 mg daily in addition to amiodarone 200 mg and metoprolol 100 mg  daily.  Continue with Eliquis 5 mg twice daily Cardiology consulted and recommendations given.     Chronic systolic CHF No signs of decompensation. She is on RA.     Hypothyroidism TSH slightly elevated, get free T4 and T3 levels. Free t4 levels elevated.    Hypertension Blood pressure parameters are slightly elevated.  Continue with cardizem, metoprolol. Add hydralazine prn.    Right foot pain  , unable to bear weight on the right foot and unable to work with PT.  Suspect gout. Added colchicine, will monitor. Get x rays of the foot.  No swelling or redness.  Tenderness in the plantar aspect of the  first metatarsal area     Hyperlipidemia Continue with statin.     History of DVT On  Eliquis.    GERD Stable.   Estimated body mass index is 27.93 kg/m as calculated from the following:   Height as of this encounter: 5' (1.524 m).   Weight as of this encounter: 64.9 kg.  Code Status: DNR.  DVT Prophylaxis:  SCDs Start: 03/18/22 1248 apixaban (ELIQUIS) tablet 5 mg   Level of Care: Level of care: Progressive Family Communication: Updated patient's family at bedside.   Disposition Plan:     Remains inpatient appropriate:  rate control.   Procedures:  None.  Consultants:   Cardiology.   Antimicrobials:   Anti-infectives (From admission, onward)    Start     Dose/Rate Route Frequency Ordered Stop   03/18/22 1700  cefdinir (OMNICEF) capsule 300 mg        300 mg Oral Daily 03/18/22 1437          Medications  Scheduled Meds:  amiodarone  200 mg Oral Daily   apixaban  5 mg Oral BID   cefdinir  300 mg Oral Daily   colchicine  0.6 mg Oral BID   diltiazem  360 mg Oral Daily   levothyroxine  100 mcg Oral QAC breakfast   metoprolol succinate  100 mg Oral  QPM   Netarsudil-Latanoprost  1 drop Ophthalmic QHS   pantoprazole  40 mg Oral Daily   pilocarpine  1 drop Left Eye BID   Continuous Infusions:  sodium chloride 10 mL/hr at 03/19/22 0600   PRN Meds:.sodium chloride, acetaminophen **OR** acetaminophen, alum & mag hydroxide-simeth, clidinium-chlordiazePOXIDE, hyoscyamine, ondansetron **OR** ondansetron (ZOFRAN) IV, polyvinyl alcohol    Subjective:   Amy Hickman was seen and examined today.  Right foot pain. No chest pain. Or sob.  No nausea, or vomiting.   Objective:   Vitals:   03/19/22 1827 03/19/22 1927 03/20/22 0407 03/20/22 1048  BP: (!) 161/80 (!) 140/78 (!) 156/75 (!) 159/78  Pulse: 71 72 68   Resp:  20 20   Temp:  97.8 F (36.6 C) 99.3 F (37.4 C)   TempSrc:  Oral Oral   SpO2:  92% 93%   Weight:      Height:        Intake/Output Summary (Last 24 hours) at 03/20/2022 1206 Last data filed at 03/20/2022 0827 Gross per 24 hour   Intake 886.6 ml  Output --  Net 886.6 ml    Filed Weights   03/17/22 1757  Weight: 64.9 kg     Exam General exam: Appears calm and comfortable  Respiratory system: Clear to auscultation. Respiratory effort normal. Cardiovascular system: S1 & S2 heard, RRR.  No pedal edema. Gastrointestinal system: Abdomen is nondistended, soft and nontender.  Central nervous system: Alert and oriented. No focal neurological deficits. Extremities: right foot tenderness .  Skin: No rashes, lesions or ulcers Psychiatry:Mood & affect appropriate.     Data Reviewed:  I have personally reviewed following labs and imaging studies   CBC Lab Results  Component Value Date   WBC 6.4 03/18/2022   RBC 4.04 03/18/2022   HGB 12.5 03/18/2022   HCT 40.8 03/18/2022   MCV 101.0 (H) 03/18/2022   MCH 30.9 03/18/2022   PLT 184 03/18/2022   MCHC 30.6 03/18/2022   RDW 15.7 (H) 03/18/2022   LYMPHSABS 1.1 03/18/2022   MONOABS 0.6 03/18/2022   EOSABS 0.2 03/18/2022   BASOSABS 0.0 80/16/5537     Last metabolic panel Lab Results  Component Value Date   NA 136 03/19/2022   K 4.3 03/19/2022   CL 107 03/19/2022   CO2 22 03/19/2022   BUN 15 03/19/2022   CREATININE 0.95 03/19/2022   GLUCOSE 103 (H) 03/19/2022   GFRNONAA 59 (L) 03/19/2022   GFRAA 54 (L) 01/22/2019   CALCIUM 8.9 03/19/2022   PHOS 2.8 03/16/2021   PROT 6.5 03/18/2022   ALBUMIN 3.8 03/18/2022   BILITOT 0.7 03/18/2022   ALKPHOS 65 03/18/2022   AST 40 03/18/2022   ALT 36 03/18/2022   ANIONGAP 7 03/19/2022    CBG (last 3)  No results for input(s): "GLUCAP" in the last 72 hours.    Coagulation Profile: No results for input(s): "INR", "PROTIME" in the last 168 hours.   Radiology Studies: No results found.     Hosie Poisson M.D. [facility] 03/20/2022, 12:06 PM  Available via Epic secure chat 7am-7pm After 7 pm, please refer to night coverage provider listed on amion.

## 2022-03-20 NOTE — Progress Notes (Signed)
Mobility Specialist Cancellation/Refusal Note:  Pt declined mobility at this time. Pt having some superior pain on foot. Requested ambulation tomorrow morning. Will check back as schedule permits.      [facility]

## 2022-03-20 NOTE — Progress Notes (Signed)
Patient declines nocturnal CPAP at this time. She complains of discomfort wit the hospital apparatus and is asking family to bring her home mask in for use tomorrow night. She states her pressures on her home machine start out low and increase over time but the hospital machine she previously tried was too high for her and she did not tolerate it. Education provided about auto-titration CPAP. She is agreeable to try once family has produced her home mask. RT will continue to follow.

## 2022-03-21 ENCOUNTER — Inpatient Hospital Stay (HOSPITAL_COMMUNITY): Payer: Medicare Other

## 2022-03-21 DIAGNOSIS — E782 Mixed hyperlipidemia: Secondary | ICD-10-CM | POA: Diagnosis not present

## 2022-03-21 DIAGNOSIS — K219 Gastro-esophageal reflux disease without esophagitis: Secondary | ICD-10-CM | POA: Diagnosis not present

## 2022-03-21 DIAGNOSIS — E039 Hypothyroidism, unspecified: Secondary | ICD-10-CM

## 2022-03-21 DIAGNOSIS — I4891 Unspecified atrial fibrillation: Secondary | ICD-10-CM | POA: Diagnosis not present

## 2022-03-21 LAB — URIC ACID: Uric Acid, Serum: 4.6 mg/dL (ref 2.5–7.1)

## 2022-03-21 NOTE — Progress Notes (Signed)
[facility]                                                                               Stehanie Ekstrom, is a 86 y.o. female, DOB - 12-15-1935, [place] date - 03/17/2022    Outpatient Primary MD for the patient is [facility]  LOS - 2  days    Brief summary    Amy Hickman is a 86 y.o. female with medical history significant of GERD, chronic systolic CHF, impaired hearing, hypertension, interstitial's otitis, osteoporosis impaired fasting glucose, hypertensive retinopathy, glaucoma, history of GI bleed, GERD, celiac disease, hyperlipidemia, hyponatremia, hypothyroidism, history of DVT, osteoarthritis of multiple sites, COPD, CKD, depression, elevated LFTs, aortic valve stenosis, CAD, chronic atrial fibrillation who is coming to the emergency department due to substernal chest pain that feels pressure-like and associated with nausea and mild dyspnea. She was admitted for atrial fib with RVR.    Assessment & Plan    Assessment and Plan: Atrial fibrillation with RVR Rate controlled on IV Cardizem transition to oral diltiazem 360 mg daily in addition to amiodarone 200 mg and metoprolol 100 mg  daily.  Continue with Eliquis 5 mg twice daily Cardiology consulted and recommendations given.     Chronic systolic CHF No signs of decompensation. She is on RA.     Hypothyroidism TSH slightly elevated, get free T4 and T3 levels. Free t4 levels elevated. T3 levels are low.  Patient on 100 mcg of levothyroxine, would deter to PCP for further adjustment of the dose.    Hypertension Blood pressure parameters are slightly elevated, possibly from rigt foot pain.  Continue with cardizem, metoprolol. Add hydralazine prn.    Right foot pain  , unable to bear weight on the right foot and unable to work with PT.   X rays shows non displaced fracture on the 4 th metatarsal.  Orthopedics consulted, recommended cam boot.  Will  request PT to work with HER with the boot on.      Hyperlipidemia Continue with statin.     History of DVT On Eliquis.    GERD Stable.   Estimated body mass index is 27.93 kg/m as calculated from the following:   Height as of this encounter: 5' (1.524 m).   Weight as of this encounter: 64.9 kg.  Code Status: DNR.  DVT Prophylaxis:  SCDs Start: 03/18/22 1248 apixaban (ELIQUIS) tablet 5 mg   Level of Care: Level of care: Telemetry Family Communication: Updated patient's family at bedside.   Disposition Plan:     Remains inpatient appropriate:  rate control.   Procedures:  None.  Consultants:   Cardiology.   Antimicrobials:   Anti-infectives (From admission, onward)    Start     Dose/Rate Route Frequency Ordered Stop   03/18/22 1700  cefdinir (OMNICEF) capsule 300 mg        300 mg Oral Daily 03/18/22 1437          Medications  Scheduled Meds:  amiodarone  200 mg Oral Daily   apixaban  5 mg Oral BID   cefdinir  300 mg Oral Daily   diltiazem  360  mg Oral Daily   levothyroxine  100 mcg Oral QAC breakfast   metoprolol succinate  100 mg Oral QPM   Netarsudil-Latanoprost  1 drop Ophthalmic QHS   pantoprazole  40 mg Oral Daily   pilocarpine  1 drop Left Eye BID   polyethylene glycol  17 g Oral Daily   senna-docusate  2 tablet Oral BID   Continuous Infusions:  sodium chloride 10 mL/hr at 03/19/22 0600   PRN Meds:.sodium chloride, acetaminophen **OR** acetaminophen, alum & mag hydroxide-simeth, hyoscyamine, ondansetron **OR** ondansetron (ZOFRAN) IV, polyvinyl alcohol    Subjective:   Amy Hickman was seen and examined today.  Right foot pain. Unable to bear weight.   Objective:   Vitals:   03/20/22 1949 03/20/22 1950 03/21/22 0502 03/21/22 1340  BP: (!) 168/74 (!) 148/73 (!) 154/70 (!) 162/75  Pulse: 68 67 62 61  Resp: 20  16 18   Temp: 99.3 F (37.4 C)  98.8 F (37.1 C) 98.4 F (36.9 C)  TempSrc: Oral  Oral Oral  SpO2: 91%  91% 92%   Weight:      Height:        Intake/Output Summary (Last 24 hours) at 03/21/2022 1517 Last data filed at 03/21/2022 1019 Gross per 24 hour  Intake 460 ml  Output 300 ml  Net 160 ml    Filed Weights   03/17/22 1757  Weight: 64.9 kg     Exam General exam: Appears calm and comfortable  Respiratory system: Clear to auscultation. Respiratory effort normal. Cardiovascular system: S1 & S2 heard, RRR. No JVD, murmurs, rubs, gallops or clicks. No pedal edema. Gastrointestinal system: Abdomen is nondistended, soft and nontender.  Central nervous system: Alert and oriented. No focal neurological deficits. Extremities: Symmetric 5 x 5 power. Skin: No rashes, lesions or ulcers Psychiatry: Judgement and insight appear normal. Mood & affect appropriate.      Data Reviewed:  I have personally reviewed following labs and imaging studies   CBC Lab Results  Component Value Date   WBC 6.4 03/18/2022   RBC 4.04 03/18/2022   HGB 12.5 03/18/2022   HCT 40.8 03/18/2022   MCV 101.0 (H) 03/18/2022   MCH 30.9 03/18/2022   PLT 184 03/18/2022   MCHC 30.6 03/18/2022   RDW 15.7 (H) 03/18/2022   LYMPHSABS 1.1 03/18/2022   MONOABS 0.6 03/18/2022   EOSABS 0.2 03/18/2022   BASOSABS 0.0 76/72/0947     Last metabolic panel Lab Results  Component Value Date   NA 136 03/19/2022   K 4.3 03/19/2022   CL 107 03/19/2022   CO2 22 03/19/2022   BUN 15 03/19/2022   CREATININE 0.95 03/19/2022   GLUCOSE 103 (H) 03/19/2022   GFRNONAA 59 (L) 03/19/2022   GFRAA 54 (L) 01/22/2019   CALCIUM 8.9 03/19/2022   PHOS 2.8 03/16/2021   PROT 6.5 03/18/2022   ALBUMIN 3.8 03/18/2022   BILITOT 0.7 03/18/2022   ALKPHOS 65 03/18/2022   AST 40 03/18/2022   ALT 36 03/18/2022   ANIONGAP 7 03/19/2022    CBG (last 3)  No results for input(s): "GLUCAP" in the last 72 hours.    Coagulation Profile: No results for input(s): "INR", "PROTIME" in the last 168 hours.   Radiology Studies: DG Foot 2 Views  Right  Result Date: 03/20/2022 CLINICAL DATA:  Foot pain EXAM: RIGHT FOOT - 2 VIEW COMPARISON:  Right foot radiograph 03/24/2015 FINDINGS: There is cortical irregularity at the base of the fourth metatarsal. There is a chronic posttraumatic deformity of  the fifth metatarsal shaft. 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 Mild first MTP and diffuse interphalangeal joint degenerative change. Tiny os navicularis. Plantar calcaneal spur. Vascular calcifications. IMPRESSION: Possible nondisplaced fracture at the base of the fourth metatarsal, correlate with point tenderness. Electronically Signed   By: Maurine Simmering M.D.   On: 03/20/2022 13:53       Hosie Poisson M.D. [facility] 03/21/2022, 3:17 PM  Available via Epic secure chat 7am-7pm After 7 pm, please refer to night coverage provider listed on amion.

## 2022-03-21 NOTE — Progress Notes (Signed)
Orthopedic Tech Progress Note Patient Details:  Amy Hickman 02/12/1936 [id number] Dropped off cam walker in patient room. Talked to Rn about having PT come to patients room to try to walk with her with cam boot on before she goes home. Ortho Devices Type of Ortho Device: CAM walker Ortho Device/Splint Location: Rle Ortho Device/Splint Interventions: Ordered, Other (comment)   Post Interventions Patient Tolerated: Other (comment) Instructions Provided: Adjustment of device, Care of device, Poper ambulation with device  Amy Hickman 03/21/2022, 2:54 PM

## 2022-03-22 DIAGNOSIS — I5022 Chronic systolic (congestive) heart failure: Secondary | ICD-10-CM | POA: Diagnosis not present

## 2022-03-22 DIAGNOSIS — I4891 Unspecified atrial fibrillation: Secondary | ICD-10-CM | POA: Diagnosis not present

## 2022-03-22 DIAGNOSIS — Z86718 Personal history of other venous thrombosis and embolism: Secondary | ICD-10-CM

## 2022-03-22 DIAGNOSIS — K219 Gastro-esophageal reflux disease without esophagitis: Secondary | ICD-10-CM | POA: Diagnosis not present

## 2022-03-22 MED ORDER — TRAMADOL HCL 50 MG PO TABS: 50.0000 mg | ORAL_TABLET | Freq: Two times a day (BID) | ORAL | 0 refills | Status: AC | PRN

## 2022-03-22 MED ORDER — APIXABAN 5 MG PO TABS: 5.0000 mg | ORAL_TABLET | Freq: Two times a day (BID) | ORAL | 2 refills | Status: DC

## 2022-03-22 MED ORDER — SENNOSIDES-DOCUSATE SODIUM 8.6-50 MG PO TABS: 2.0000 | ORAL_TABLET | Freq: Two times a day (BID) | ORAL | Status: DC

## 2022-03-22 MED ORDER — TRAMADOL HCL 50 MG PO TABS
50.0000 mg | ORAL_TABLET | Freq: Two times a day (BID) | ORAL | Status: DC | PRN
  Administered 2022-03-22: 50 mg via ORAL
  Filled 2022-03-22: qty 1

## 2022-03-22 NOTE — Progress Notes (Signed)
Physical Therapy Treatment Patient Details Name: Amy Hickman MRN: [id number] DOB: Aug 30, 1935 Today's Date: 03/22/2022   History of Present Illness Patient is 86 y.o. female presented to the emergency department due to substernal chest pain that feels pressure-like and associated with nausea and mild dyspnea. Pt admitted for Afib with RVR.Pt diagnosed with non-displaced fx of 5th MT on 03/21/22 with order for ambulation in CAM boot. PMH significant for GERD, chronic systolic CHF, impaired hearing, hypertension, interstitial's otitis, osteoporosis impaired fasting glucose, hypertensive retinopathy, glaucoma, history of GI bleed, GERD, celiac disease, hyperlipidemia, hyponatremia, hypothyroidism, history of DVT, osteoarthritis of multiple sites, COPD, CKD, depression, elevated LFTs, aortic valve stenosis, CAD, chronic atrial fibrillation.    PT Comments    Pt fatigued with gait with RW and CAM boot and recommend family be with her for gait initially.  Changing recommendation to HHPT now due to boot and unsteadiness with it.   Recommendations for follow up therapy are one component of a multi-disciplinary discharge planning process, led by the attending physician.  Recommendations may be updated based on patient status, additional functional criteria and insurance authorization.  Follow Up Recommendations  Home health PT     Assistance Recommended at Discharge Intermittent Supervision/Assistance  Patient can return home with the following A little help with walking and/or transfers;A little help with bathing/dressing/bathroom;Assistance with cooking/housework;Assist for transportation;Help with stairs or ramp for entrance   Equipment Recommendations  None recommended by PT    Recommendations for Other Services       Precautions / Restrictions Precautions Precautions: Fall Precaution Comments: 2 falls at front step Required Braces or Orthoses: Other Brace (CAM Boot) Restrictions RLE  Weight Bearing: Weight bearing as tolerated     Mobility  Bed Mobility Overal bed mobility: Modified Independent             General bed mobility comments: HOB slightly elevated    Transfers Overall transfer level: Needs assistance Equipment used: Rolling walker (2 wheels) Transfers: Sit to/from Stand Sit to Stand: Min guard           General transfer comment: min/guard for safety/balance    Ambulation/Gait Ambulation/Gait assistance: Min guard, Min assist Gait Distance (Feet): 80 Feet Assistive device: Rollator (4 wheels) Gait Pattern/deviations: Decreased stance time - right Gait velocity: decr     General Gait Details: Amb with CAM boot on and recommended to family she wears her sneaker on her L foot to help even her out.  progressed from min to min/guard, but she fatigued.   Stairs             Wheelchair Mobility    Modified Rankin (Stroke Patients Only)       Balance Overall balance assessment: Needs assistance           Standing balance-Leahy Scale: Fair Standing balance comment: fair static, poor dynamic                            Cognition Arousal/Alertness: Awake/alert Behavior During Therapy: WFL for tasks assessed/performed Overall Cognitive Status: Within Functional Limits for tasks assessed                                          Exercises      General Comments        Pertinent Vitals/Pain Pain Assessment Pain Assessment: Faces Faces Pain  Scale: Hurts little more Pain Location: R foot Pain Intervention(s): Monitored during session    Home Living                          Prior Function            PT Goals (current goals can now be found in the care plan section) Acute Rehab PT Goals PT Goal Formulation: With patient/family Time For Goal Achievement: 04/02/22 Potential to Achieve Goals: Good Progress towards PT goals: Progressing toward goals    Frequency    Min  3X/week      PT Plan Discharge plan needs to be updated    Co-evaluation              AM-PAC PT "6 Clicks" Mobility   Outcome Measure  Help needed turning from your back to your side while in a flat bed without using bedrails?: None Help needed moving from lying on your back to sitting on the side of a flat bed without using bedrails?: None Help needed moving to and from a bed to a chair (including a wheelchair)?: A Little Help needed standing up from a chair using your arms (e.g., wheelchair or bedside chair)?: A Little Help needed to walk in hospital room?: A Little Help needed climbing 3-5 steps with a railing? : A Lot 6 Click Score: 19    End of Session Equipment Utilized During Treatment: Gait belt Activity Tolerance: Patient tolerated treatment well Patient left: in chair;with call bell/phone within reach;with family/visitor present Nurse Communication: Mobility status PT Visit Diagnosis: Unsteadiness on feet (R26.81);History of falling (Z91.81)     Time: 3435-6861 PT Time Calculation (min) (ACUTE ONLY): 25 min  Charges:  $Gait Training: 8-22 mins $Therapeutic Activity: 8-22 mins                     Amy Hickman., PT Office [phone] Acute Rehab 03/22/2022    Galen Manila 03/22/2022, 2:18 PM

## 2022-03-22 NOTE — Discharge Summary (Signed)
Physician Discharge Summary   Patient: Amy Hickman MRN: [id number] DOB: [birth year]  Admit date:     03/17/2022  Discharge date: {dischdate:26783}  Discharge Physician: Hosie Poisson   PCP: [facility]   Recommendations at discharge:  {Tip this will not be part of the note when signed- Example include specific recommendations for outpatient follow-up, pending tests to follow-up on. (Optional):26781}  ***  Discharge Diagnoses: Principal Problem:   Atrial fibrillation with rapid ventricular response (HCC) Active Problems:   Chronic systolic CHF (congestive heart failure) (HCC)   Atrial fibrillation with RVR (HCC)   Hypothyroidism   Coronary artery disease involving native coronary artery of native heart without angina pectoris   Essential hypertension   Hyperlipidemia   GERD (gastroesophageal reflux disease)   History of DVT (deep vein thrombosis)   Atrial fibrillation ([place])  Resolved Problems:   * No resolved hospital problems. [facility] Course: No notes on file  Assessment and Plan: No notes have been filed under this hospital service. Service: Hospitalist     {Tip this will not be part of the note when signed Body mass index is 27.93 kg/m. , ,  (Optional):26781}  {(NOTE) Pain control PDMP Statment (Optional):26782} Consultants: *** Procedures performed: ***  Disposition: {Plan; Disposition:26390} Diet recommendation:  Discharge Diet Orders (From admission, onward)     Start     Ordered   03/22/22 0000  Diet - low sodium heart healthy        03/22/22 1638           {Diet_Plan:26776} DISCHARGE MEDICATION: Allergies as of 03/22/2022       Reactions   Ativan [lorazepam] Other (See Comments)   "Knocked her out, woke up 3 days later"   Augmentin [amoxicillin-pot Clavulanate] Nausea And Vomiting   Avelox [moxifloxacin] Itching, Nausea Only   Brimonidine Itching, Swelling   Other reaction(s): Other (See  Comments) redness   Chlorzoxazone Other (See Comments)   Went crazy/loopy   Ciprofloxacin Hcl Itching, Nausea Only   Clavulanic Acid    Other reaction(s): Unknown   Crestor [rosuvastatin] Other (See Comments)   myalgia   Ezetimibe Diarrhea   Other reaction(s): Other (See Comments) Stomach cramps   Gluten Meal Other (See Comments)   Diarrhea, hot/cold sweat, will sleep for a couple of hours - Celiac disease   Nitrofuran Derivatives Itching, Nausea Only   Phenergan [promethazine Hcl] Other (See Comments)   hallucinations   Wheat Bran    Other reaction(s): Other (See Comments) Gi distress- celiac disease   Codeine Palpitations   Darvon [propoxyphene Hcl] Palpitations   Doxycycline Itching, Nausea Only        Medication List     TAKE these medications    acetaminophen 500 MG tablet Commonly known as: TYLENOL Take 1 tablet (500 mg total) by mouth every 6 (six) hours as needed for pain. What changed:  how much to take reasons to take this   amiodarone 200 MG tablet Commonly known as: PACERONE Take 1 tablet (200 mg total) by mouth daily.   apixaban 5 MG Tabs tablet Commonly known as: ELIQUIS Take 1 tablet (5 mg total) by mouth 2 (two) times daily. What changed:  medication strength See the new instructions.   Artificial Tears 1.4 % ophthalmic solution Generic drug: polyvinyl alcohol Place 1 drop into both eyes as needed for dry eyes.   cefdinir 300 MG capsule Commonly known as: OMNICEF Take 300 mg by mouth daily.   clidinium-chlordiazePOXIDE  5-2.5 MG capsule Commonly known as: LIBRAX Take 2 capsules by mouth daily as needed (cramping).   CRANBERRY-VITAMIN C PO Take 1 capsule by mouth in the morning.   diltiazem 360 MG 24 hr capsule Commonly known as: CARDIZEM CD Take 1 capsule (360 mg total) by mouth daily.   esomeprazole 40 MG capsule Commonly known as: NEXIUM Take 40 mg by mouth daily before breakfast.   hydrALAZINE 50 MG tablet Commonly known as:  APRESOLINE Take 50 mg by mouth as directed. Take 1 and 1/2 tablets three times a day   Hyoscyamine Sulfate SL 0.125 MG Subl Place 0.125 mg under the tongue every 4 (four) hours as needed (cramping).   isosorbide dinitrate 10 MG tablet Commonly known as: ISORDIL Take 10 mg by mouth 2 (two) times daily.   levothyroxine 100 MCG tablet Commonly known as: SYNTHROID Take 100 mcg by mouth daily before breakfast.   metoprolol succinate 100 MG 24 hr tablet Commonly known as: TOPROL-XL Take 1 tablet (100 mg total) by mouth daily. Take with or immediately following a meal. What changed:  when to take this additional instructions   pilocarpine 1 % ophthalmic solution Commonly known as: PILOCAR Place 1 drop into the left eye 2 (two) times daily.   Rocklatan 0.02-0.005 % Soln Generic drug: Netarsudil-Latanoprost Place 1 drop into both eyes at bedtime.   senna-docusate 8.6-50 MG tablet Commonly known as: Senokot-S Take 2 tablets by mouth 2 (two) times daily.   traMADol 50 MG tablet Commonly known as: ULTRAM Take 1 tablet (50 mg total) by mouth every 12 (twelve) hours as needed for up to 5 days for moderate pain or severe pain.   Vitamin B Complex Tabs Take 1 tablet by mouth daily.   Vitamin D (Ergocalciferol) 1.25 MG (50000 UNIT) Caps capsule Commonly known as: DRISDOL Take 50,000 Units by mouth every 7 (seven) days.        Follow-up Information     Loel Dubonnet, NP Follow up on 04/22/2022.   Specialty: Cardiology Why: @3 :10PM. Post hospital follow up with cardiology Contact information: [place] [address] [phone]         Buford Dresser, MD Follow up on 05/26/2022.   Specialty: Cardiology Why: 11:20AM. Cardiology visit Contact information: [place] [address] [phone]         Armond Hang, MD Follow up in 3 day(s).   Specialty: Orthopedic Surgery Why: Follow up xrays and evaluation Contact  information: [address] [place] [address] [phone]                Discharge Exam: Danley Danker Weights   03/17/22 1757  Weight: 64.9 kg   ***  Condition at discharge: {DC Condition:26389}  The results of significant diagnostics from this hospitalization (including imaging, microbiology, ancillary and laboratory) are listed below for reference.   Imaging Studies: DG Foot 2 Views Left  Result Date: 03/21/2022 CLINICAL DATA:  Increasing left foot pain. EXAM: LEFT FOOT - 2 VIEW COMPARISON:  Left ankle radiographs 10/20/2012 FINDINGS: No acute bone or soft tissue abnormalities are present. Asymmetric densities are present within the plantar fascia low a small calcaneal spur. No acute or healing fractures are present. IMPRESSION: 1. Asymmetric densities within the plantar fascia likely reflect changes of plantar tendinopathy. 2. Small calcaneal spur. 3. No acute or healing fractures. Electronically Signed   By: San Morelle M.D.   On: 03/21/2022 19:20   DG Foot 2 Views Right  Result  Date: 03/20/2022 CLINICAL DATA:  Foot pain EXAM: RIGHT FOOT - 2 VIEW COMPARISON:  Right foot radiograph 03/24/2015 FINDINGS: There is cortical irregularity at the base of the fourth metatarsal. There is a chronic posttraumatic deformity of the fifth metatarsal shaft. 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 Mild first MTP and diffuse interphalangeal joint degenerative change. Tiny os navicularis. Plantar calcaneal spur. Vascular calcifications. IMPRESSION: Possible nondisplaced fracture at the base of the fourth metatarsal, correlate with point tenderness. Electronically Signed   By: Maurine Simmering M.D.   On: 03/20/2022 13:53   DG Chest 2 View  Result Date: 03/17/2022 CLINICAL DATA:  Chest pain. EXAM: CHEST - 2 VIEW COMPARISON:  December 22, 2021. FINDINGS: The heart size and mediastinal contours are within normal limits. Both lungs are clear. Status post bilateral shoulder  arthroplasties. IMPRESSION: No active cardiopulmonary disease. Electronically Signed   By: Marijo Conception M.D.   On: 03/17/2022 18:59    Microbiology: Results for orders placed or performed during the hospital encounter of 03/15/21  Resp Panel by RT-PCR (Flu A&B, Covid) Nasopharyngeal Swab     Status: Abnormal   Collection Time: 03/15/21  3:49 PM   Specimen: Nasopharyngeal Swab; Nasopharyngeal(NP) swabs in vial transport medium  Result Value Ref Range Status   SARS Coronavirus 2 by RT PCR NEGATIVE NEGATIVE Final    Comment: (NOTE) SARS-CoV-2 target nucleic acids are NOT DETECTED.  The SARS-CoV-2 RNA is generally detectable in upper respiratory specimens during the acute phase of infection. The lowest concentration of SARS-CoV-2 viral copies this assay can detect is 138 copies/mL. A negative result does not preclude SARS-Cov-2 infection and should not be used as the sole basis for treatment or other patient management decisions. A negative result may occur with  improper specimen collection/handling, submission of specimen other than nasopharyngeal swab, presence of viral mutation(s) within the areas targeted by this assay, and inadequate number of viral copies(<138 copies/mL). A negative result must be combined with clinical observations, patient history, and epidemiological information. The expected result is Negative.  Fact Sheet for Patients:  EntrepreneurPulse.com.au  Fact Sheet for Healthcare Providers:  IncredibleEmployment.be  This test is no t yet approved or cleared by the [place] FDA and  has been authorized for detection and/or diagnosis of SARS-CoV-2 by FDA under an Emergency Use Authorization (EUA). This EUA will remain  in effect (meaning this test can be used) for the duration of the COVID-19 declaration under Section 564(b)(1) of the Act, 21 U.S.C.section 360bbb-3(b)(1), unless the authorization is terminated  or revoked  sooner.       Influenza A by PCR POSITIVE (A) NEGATIVE Final   Influenza B by PCR NEGATIVE NEGATIVE Final    Comment: (NOTE) The Xpert Xpress SARS-CoV-2/FLU/RSV plus assay is intended as an aid in the diagnosis of influenza from Nasopharyngeal swab specimens and should not be used as a sole basis for treatment. Nasal washings and aspirates are unacceptable for Xpert Xpress SARS-CoV-2/FLU/RSV testing.  Fact Sheet for Patients: EntrepreneurPulse.com.au  Fact Sheet for Healthcare Providers: IncredibleEmployment.be  This test is not yet approved or cleared by the [place] FDA and has been authorized for detection and/or diagnosis of SARS-CoV-2 by FDA under an Emergency Use Authorization (EUA). This EUA will remain in effect (meaning this test can be used) for the duration of the COVID-19 declaration under Section 564(b)(1) of the Act, 21 U.S.C. section 360bbb-3(b)(1), unless the authorization is terminated or revoked.  Performed at [facility], [address] [place],  [place] [address]   Culture, blood (x 2)     Status: None   Collection Time: 03/15/21 11:30 PM   Specimen: BLOOD  Result Value Ref Range Status   Specimen Description   Final    BLOOD RIGHT WRIST Performed at [facility] [address] [place], [place] [address]    Special Requests   Final    BOTTLES DRAWN AEROBIC ONLY Blood Culture adequate volume Performed at [facility] [address], [place], [place] [address]    Culture   Final    NO GROWTH 5 DAYS Performed at [facility], [place] [address] [place], [place] [address]    Report Status 03/21/2021 FINAL  Final  Culture, blood (x 2)     Status: None   Collection Time: 03/15/21 11:30 PM   Specimen: BLOOD  Result Value Ref Range Status   Specimen Description   Final    BLOOD BLOOD LEFT FOREARM Performed at [facility] [address], [place], [place] [address]    Special Requests   Final    BOTTLES DRAWN AEROBIC ONLY Blood Culture adequate volume Performed at [facility] [address], [place], [place] [address]    Culture   Final    NO GROWTH 5 DAYS Performed at [facility], [place] [address], [place], [place] [address]    Report Status 03/21/2021 FINAL  Final  MRSA Next Gen by PCR, Nasal     Status: None   Collection Time: 03/16/21 12:56 AM   Specimen: Urine, Clean Catch; Nasal Swab  Result Value Ref Range Status   MRSA by PCR Next Gen NOT DETECTED NOT DETECTED Final    Comment: (NOTE) The GeneXpert MRSA Assay (FDA approved for NASAL specimens only), is one component of a comprehensive MRSA colonization surveillance program. It is not intended to diagnose MRSA infection nor to guide or monitor treatment for MRSA infections. Test performance is not FDA approved in patients less than 35 years old. Performed at [facility], [place] [address] [place], [place] [address]     Labs: CBC: Recent Labs  Lab 03/17/22 1810 03/18/22 1325  WBC 6.1 6.4  NEUTROABS  --  4.4  HGB 12.0 12.5  HCT 37.6 40.8  MCV 98.2 101.0*  PLT 199 539   Basic Metabolic Panel: Recent Labs  Lab 03/17/22 1810 03/18/22 1325 03/19/22 0343  NA 138 139 136  K 3.9 4.1 4.3  CL 103 108 107  CO2 23 22 22   GLUCOSE 105* 92 103*  BUN 20 12 15   CREATININE 1.10* 0.95 0.95  CALCIUM 9.6 9.1 8.9  MG  --  2.2  --    Liver Function Tests: Recent Labs  Lab 03/18/22 1325  AST 40  ALT 36  ALKPHOS 65  BILITOT 0.7  PROT 6.5  ALBUMIN 3.8   CBG: No results for input(s): "GLUCAP" in the last 168 hours.  Discharge time spent: {LESS THAN/GREATER YDSW:97915} 30 minutes.  Signed: Hosie Poisson, MD [facility] 03/22/2022

## 2022-04-20 ENCOUNTER — Other Ambulatory Visit (HOSPITAL_COMMUNITY): Payer: Self-pay | Admitting: Nurse Practitioner

## 2022-04-20 DIAGNOSIS — I4819 Other persistent atrial fibrillation: Secondary | ICD-10-CM

## 2022-04-20 DIAGNOSIS — I5022 Chronic systolic (congestive) heart failure: Secondary | ICD-10-CM

## 2022-04-22 ENCOUNTER — Ambulatory Visit (INDEPENDENT_AMBULATORY_CARE_PROVIDER_SITE_OTHER): Payer: Medicare Other | Admitting: Family

## 2022-04-22 ENCOUNTER — Encounter (HOSPITAL_BASED_OUTPATIENT_CLINIC_OR_DEPARTMENT_OTHER): Payer: Self-pay | Admitting: Family

## 2022-04-22 ENCOUNTER — Other Ambulatory Visit (INDEPENDENT_AMBULATORY_CARE_PROVIDER_SITE_OTHER): Payer: Medicare Other

## 2022-04-22 VITALS — BP 145/90 | HR 59 | Ht 60.0 in | Wt 149.1 lb

## 2022-04-22 DIAGNOSIS — N1832 Chronic kidney disease, stage 3b: Secondary | ICD-10-CM

## 2022-04-22 DIAGNOSIS — I480 Paroxysmal atrial fibrillation: Secondary | ICD-10-CM

## 2022-04-22 DIAGNOSIS — I5042 Chronic combined systolic (congestive) and diastolic (congestive) heart failure: Secondary | ICD-10-CM | POA: Diagnosis not present

## 2022-04-22 DIAGNOSIS — D6859 Other primary thrombophilia: Secondary | ICD-10-CM | POA: Diagnosis not present

## 2022-04-22 DIAGNOSIS — I872 Venous insufficiency (chronic) (peripheral): Secondary | ICD-10-CM

## 2022-04-22 DIAGNOSIS — I10 Essential (primary) hypertension: Secondary | ICD-10-CM

## 2022-04-22 NOTE — Patient Instructions (Addendum)
Medication Instructions:  Your Physician recommend you continue on your current medication as directed.    Please take midday Hydralazine dose and check blood pressure at least one hour after medications.   *If you need a refill on your cardiac medications before your next appointment, please call your pharmacy*  Testing/Procedures: Your physician has recommended that you wear a Zio monitor.   This monitor is a medical device that records the heart's electrical activity. Doctors most often use these monitors to diagnose arrhythmias. Arrhythmias are problems with the speed or rhythm of the heartbeat. The monitor is a small device applied to your chest. You can wear one while you do your normal daily activities. While wearing this monitor if you have any symptoms to push the button and record what you felt. Once you have worn this monitor for the period of time provider prescribed (Usually 14 days), you will return the monitor device in the postage paid box. Once it is returned they will download the data collected and provide [place] with a report which the provider will then review and we will call you with those results. Important tips:  Avoid showering during the first 24 hours of wearing the monitor. Avoid excessive sweating to help maximize wear time. Do not submerge the device, no hot tubs, and no swimming pools. Keep any lotions or oils away from the patch. After 24 hours you may shower with the patch on. Take brief showers with your back facing the shower head.  Do not remove patch once it has been placed because that will interrupt data and decrease adhesive wear time. Push the button when you have any symptoms and write down what you were feeling. Once you have completed wearing your monitor, remove and place into box which has postage paid and place in your outgoing mailbox.  If for some reason you have misplaced your box then call our office and we can provide another box and/or mail it off  for you.   Follow-Up: At [facility], you and your health needs are our priority.  As part of our continuing mission to provide you with exceptional heart care, we have created designated Provider Care Teams.  These Care Teams include your primary Cardiologist (physician) and Advanced Practice Providers (APPs -  Physician Assistants and Nurse Practitioners) who all work together to provide you with the care you need, when you need it.  We recommend signing up for the patient portal called "MyChart".  Sign up information is provided on this After Visit Summary.  MyChart is used to connect with patients for Virtual Visits (Telemedicine).  Patients are able to view lab/test results, encounter notes, upcoming appointments, etc.  Non-urgent messages can be sent to your provider as well.   To learn more about what you can do with MyChart, go to [email or web address].    Your next appointment:   Follow up as scheduled with Dr. Harrell Gave

## 2022-04-22 NOTE — Progress Notes (Addendum)
Office Visit    Patient Name: Amy Hickman Date of Encounter: 04/25/2022  PCP:  [facility], [place]  Cardiologist:  Buford Dresser, MD  Advanced Practice Provider:  No care team member to display Electrophysiologist:  None      Chief Complaint    Amy Hickman is a 87 y.o. female presents today for hospital follow up   Past Medical History    Past Medical History:  Diagnosis Date   Atrial fibrillation, chronic ([place])    s/p multiple cardioversions with sustained NSR, on Eliquis   Celiac disease    Chronic systolic CHF (congestive heart failure) ([place])    Coronary artery disease involving native coronary artery of native heart without angina pectoris 03/15/2021   GERD (gastroesophageal reflux disease)    HOH (hard of hearing)    Hyperparathyroidism ([place]) 12/12/2020   Hypertension    Interstitial cystitis    Past Surgical History:  Procedure Laterality Date   ABDOMINAL HYSTERECTOMY     APPENDECTOMY     CARDIOVERSION N/A 04/24/2021   Procedure: CARDIOVERSION;  Surgeon: Jerline Pain, MD;  Location: [place];  Service: Cardiovascular;  Laterality: N/A;   CARDIOVERSION N/A 06/12/2021   Procedure: CARDIOVERSION;  Surgeon: Buford Dresser, MD;  Location: [place];  Service: Cardiovascular;  Laterality: N/A;   CARDIOVERSION N/A 08/11/2021   Procedure: CARDIOVERSION;  Surgeon: [name], MD;  Location: [facility];  Service: Cardiovascular;  Laterality: N/A;   CHOLECYSTECTOMY     ear drum surgery     REPLACEMENT TOTAL KNEE     ROTATOR CUFF REPAIR Right    TONSILLECTOMY      Allergies  Allergies  Allergen Reactions   Ativan [Lorazepam] Other (See Comments)    "Knocked her out, woke up 3 days later"   Augmentin [Amoxicillin-Pot Clavulanate] Nausea And Vomiting   Avelox [Moxifloxacin] Itching and Nausea Only   Brimonidine Itching and Swelling    Other reaction(s): Other  (See Comments) redness   Chlorzoxazone Other (See Comments)    Went crazy/loopy   Ciprofloxacin Hcl Itching and Nausea Only   Clavulanic Acid     Other reaction(s): Unknown   Crestor [Rosuvastatin] Other (See Comments)    myalgia   Ezetimibe Diarrhea    Other reaction(s): Other (See Comments) Stomach cramps   Gluten Meal Other (See Comments)    Diarrhea, hot/cold sweat, will sleep for a couple of hours - Celiac disease   Nitrofuran Derivatives Itching and Nausea Only   Phenergan [Promethazine Hcl] Other (See Comments)    hallucinations   Wheat Bran     Other reaction(s): Other (See Comments) Gi distress- celiac disease   Codeine Palpitations   Darvon [Propoxyphene Hcl] Palpitations   Doxycycline Itching and Nausea Only    History of Present Illness    Amy Hickman is a 87 y.o. female with a hx of atrial fibrillation, chronic systolic heart failure, hypothyroidism, hypertension, COPD, CKDIII/IV last seen while hospitalized.  Prior [facility] 08/09/20 mild aortic stenosis, 25% osital to prox LAD, 60% mid LAD, 60% D1, 20% OM 2, 40% prox to mid RCA. Mid LAD and D2 lesion negative iFR. Admission 03/15/21 for sepsis due to influenza A found to have atrisl fib with RVR. 04/24/21 DCCV with recurrent atrial fibrillation. Amiodarone started and repeat DCCV 06/12/21.   ED visit 12/22/21 with chest pain and negative workup. Seen 01/05/22 doing well though due to elevated BP Hydralazine increased to  36m TID.   [facility] [place] [facility]   Admitted 03/17/22-04/03/22 with atrial fibrillation with RVR. Rate controlled with Cardizem and started on Amiodarone. Hydralazine PRN added for BP control.   Presents today for follow up with her daughter. Right foot injury now wearing a foot boot but ambulating well. Since leaving the hospital will have an episode of her discomfort in her chest described as "something on her chest" or like "electrical  shock". Feels slight palpitations as well as shortness of breath associated with it. Some improvement with tylenol. Occurs at rest or with activity. Lasts about 10-15 minutes. Blood pressure at home 145-187/90. She has not been taking her Hydralazine midday regularly as often forgets  EKGs/Labs/Other Studies Reviewed:   The following studies were reviewed today:   EKG:  EKG is  ordered today.  The ekg ordered today demonstrates SB 59 bpmw with first degree AV block PR 59 ms with left axis deviation and no acute ST/T wave changes.   Recent Labs: 03/18/2022: ALT 36; B Natriuretic Peptide 187.8; Hemoglobin 12.5; Magnesium 2.2; Platelets 184; TSH 10.620 03/19/2022: BUN 15; Creatinine, Ser 0.95; Potassium 4.3; Sodium 136  Recent Lipid Panel No results found for: "CHOL", "TRIG", "HDL", "CHOLHDL", "VLDL", "LDLCALC", "LDLDIRECT"   Home Medications   Current Meds  Medication Sig   acetaminophen (TYLENOL) 500 MG tablet Take 1 tablet (500 mg total) by mouth every 6 (six) hours as needed for pain. (Patient taking differently: Take 1,000 mg by mouth every 6 (six) hours as needed for pain or moderate pain.)   amiodarone (PACERONE) 200 MG tablet Take 1 tablet (200 mg total) by mouth daily.   apixaban (ELIQUIS) 5 MG TABS tablet Take 1 tablet (5 mg total) by mouth 2 (two) times daily.   B Complex Vitamins (VITAMIN B COMPLEX) TABS Take 1 tablet by mouth daily.   clidinium-chlordiazePOXIDE (LIBRAX) 5-2.5 MG capsule Take 2 capsules by mouth daily as needed (cramping).   CRANBERRY-VITAMIN C PO Take 1 capsule by mouth in the morning.   diltiazem (CARDIZEM CD) 360 MG 24 hr capsule Take 1 capsule (360 mg total) by mouth daily.   esomeprazole (NEXIUM) 40 MG capsule Take 40 mg by mouth daily before breakfast.   hydrALAZINE (APRESOLINE) 50 MG tablet Take 50 mg by mouth as directed. Take 1 and 1/2 tablets three times a day   Hyoscyamine Sulfate SL 0.125 MG SUBL Place 0.125 mg under the tongue every 4 (four) hours  as needed (cramping).   isosorbide dinitrate (ISORDIL) 10 MG tablet Take 10 mg by mouth 2 (two) times daily.   levothyroxine (SYNTHROID) 100 MCG tablet Take 100 mcg by mouth daily before breakfast.   metoprolol succinate (TOPROL-XL) 100 MG 24 hr tablet TAKE 1 TABLET DAILY TAKE WITH OR IMMEDIATELY FOLLOWING A MEAL   Netarsudil-Latanoprost (ROCKLATAN) 0.02-0.005 % SOLN Place 1 drop into both eyes at bedtime.   pilocarpine (PILOCAR) 1 % ophthalmic solution Place 1 drop into the left eye 2 (two) times daily.   polyvinyl alcohol (ARTIFICIAL TEARS) 1.4 % ophthalmic solution Place 1 drop into both eyes as needed for dry eyes.   senna-docusate (SENOKOT-S) 8.6-50 MG tablet Take 2 tablets by mouth 2 (two) times daily.   Vitamin D, Ergocalciferol, (DRISDOL) 1.25 MG (50000 UNIT) CAPS capsule Take 50,000 Units by mouth every 7 (seven) days.     Review of Systems      All other systems reviewed and are otherwise negative except as noted  above.  Physical Exam    VS:  BP (!) 148/70 (BP Location: Left Arm, Patient Position: Sitting, Cuff Size: Normal)   Pulse (!) 59   Ht 5' (1.524 m)   Wt 149 lb 1.6 oz (67.6 kg)   BMI 29.12 kg/m  , BMI Body mass index is 29.12 kg/m.  Wt Readings from Last 3 Encounters:  04/22/22 149 lb 1.6 oz (67.6 kg)  03/17/22 143 lb (64.9 kg)  02/20/22 151 lb 14.4 oz (68.9 kg)     GEN: Well nourished, well developed, in no acute distress. HEENT: normal. Neck: Supple, no JVD, carotid bruits, or masses. Cardiac: RRR, no murmurs, rubs, or gallops. No clubbing, cyanosis, edema.  Radials/PT 2+ and equal bilaterally.  Respiratory:  Respirations regular and unlabored, clear to auscultation bilaterally. GI: Soft, nontender, nondistended. MS: No deformity or atrophy. Skin: Warm and dry, no rash. Neuro:  Strength and sensation are intact. Psych: Normal affect.  Assessment & Plan    PAF / Hypercoagulable state - In NSR today. Continue Amiodarone, Eliquis, Diltiazem. Mildly  bradycardic but asymptomatic with no lightheadedness, dizziness. Reports episodes of palpitations. 14 day ZIO to determine afib burden, assess rate control when in atrial fibrillation, and rule out more dangerous arrhythmia such as VT.  Amiodarone monitoring: TSH monitored by PCP given hypothyroidism. 03/18/22 normal ALT/AST. No evidence of toxicity.  Hypothyroidism - Continue to follow with PCP. Predates Amiodarone.   HTN - Elevated in clinic. Often missing afternoon dose of Hydralazine. She is agreeable to improve her compliance with antihypertensive regimen and monitor at home. Educated to report BP consistently >130/80.   OSA - CPAP compliance encouraged.   Aortic stenosis / MItral regurgitation - Last echo 12/2021. Consider echo 12/2022. Continue optimal BP control.  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  Follows with nephrology.   CHF / Deconditioning - Euvolemic and well compensated on exam. GDMT limited by CKD but does include Isordil, Metoprolol, Hydralazine. Not requiring loop diuretic at this time. Benefits of Diltiazem with her atrial fibrillation outweigh risks at this time with only mildly reduced EF 40-45%. Referred to home health today for deconditioning and management of heart failure. Request HH PT and Alexander RN.    Disposition: Follow up  as scheduled  with Buford Dresser, MD or APP.  Signed, Loel Dubonnet, NP 04/25/2022, 11:53 AM [place]

## 2022-04-25 ENCOUNTER — Encounter (HOSPITAL_BASED_OUTPATIENT_CLINIC_OR_DEPARTMENT_OTHER): Payer: Self-pay | Admitting: Family

## 2022-05-07 ENCOUNTER — Encounter (HOSPITAL_BASED_OUTPATIENT_CLINIC_OR_DEPARTMENT_OTHER): Payer: Self-pay

## 2022-05-07 NOTE — Telephone Encounter (Signed)
BP update

## 2022-05-26 ENCOUNTER — Ambulatory Visit (INDEPENDENT_AMBULATORY_CARE_PROVIDER_SITE_OTHER): Payer: Medicare Other | Admitting: Cardiology

## 2022-05-26 ENCOUNTER — Encounter (HOSPITAL_BASED_OUTPATIENT_CLINIC_OR_DEPARTMENT_OTHER): Payer: Self-pay | Admitting: Cardiology

## 2022-05-26 VITALS — BP 172/90 | HR 58 | Ht 60.0 in | Wt 149.5 lb

## 2022-05-26 DIAGNOSIS — I480 Paroxysmal atrial fibrillation: Secondary | ICD-10-CM

## 2022-05-26 DIAGNOSIS — D6869 Other thrombophilia: Secondary | ICD-10-CM

## 2022-05-26 DIAGNOSIS — Z7901 Long term (current) use of anticoagulants: Secondary | ICD-10-CM | POA: Diagnosis not present

## 2022-05-26 DIAGNOSIS — I428 Other cardiomyopathies: Secondary | ICD-10-CM

## 2022-05-26 DIAGNOSIS — I10 Essential (primary) hypertension: Secondary | ICD-10-CM

## 2022-05-26 DIAGNOSIS — I5042 Chronic combined systolic (congestive) and diastolic (congestive) heart failure: Secondary | ICD-10-CM

## 2022-05-26 MED ORDER — CARVEDILOL 12.5 MG PO TABS: 12.5000 mg | ORAL_TABLET | Freq: Two times a day (BID) | ORAL | 3 refills | Status: DC

## 2022-05-26 NOTE — Patient Instructions (Addendum)
Medication Instructions:  STOP METOPROLOL   START CARVEDILOL 12.5 MG TWICE A DAY   *If you need a refill on your cardiac medications before your next appointment, please call your pharmacy*  Lab Work: NONE  Testing/Procedures: NONE   Follow-Up: At [facility], you and your health needs are our priority.  As part of our continuing mission to provide you with exceptional heart care, we have created designated Provider Care Teams.  These Care Teams include your primary Cardiologist (physician) and Advanced Practice Providers (APPs -  Physician Assistants and Nurse Practitioners) who all work together to provide you with the care you need, when you need it.  We recommend signing up for the patient portal called "MyChart".  Sign up information is provided on this After Visit Summary.  MyChart is used to connect with patients for Virtual Visits (Telemedicine).  Patients are able to view lab/test results, encounter notes, upcoming appointments, etc.  Non-urgent messages can be sent to your provider as well.   To learn more about what you can do with MyChart, go to [email or web address].    Your next appointment:   4 week(s)  Provider:   Buford Dresser, MD   Other Instructions MONITOR YOUR BLOOD PRESSURE DAILY AND LOG. BRING LOG AND BLOOD PRESSURE MACHINE TO YOUR FOLLOW UP

## 2022-05-26 NOTE — Progress Notes (Incomplete)
Cardiology Office Note:    Date:  05/26/2022   ID:  Amy Hickman, DOB November 25, 1935, MRN [id number]  PCP:  [facility]  Cardiologist:  Buford Dresser, MD  Referring MD: [facility]*   CC: follow up  History of Present Illness:    Amy Hickman is a 87 y.o. female with a hx of persistent atrial fibrillation, hypertension, OSA on CPAP, CAD, CKD, GERD, COPD, and celiac disease who is seen for follow up today. I met her during her admission 03/17/21 for the evaluation and management of atrial fibrillation.  History: Admission 03/15/21 for sepsis in the setting of influenza A, found to have afib RVR. EF slightly reduced. Difficult to rate control, required both metoprolol and diltiazem. Cardioversion 04/24/2021 got her into sinus rhythm, but she then returned to atrial fibrillation. Started on amiodarone, repeat DCCV 06/12/21 with restoration of sinus rhythm.   She presented to the ED on 12/22/2021 for left chest pain. She was experiencing chest pain when attempting to breathe hard. This pain has become worse in the last week. She took 6 NTG with transient improvement before she got hot and nauseated. Troponin was negatve x2. Chest X-ray negative. Admission to the hospital was recommended by cardiology for further work up.   At her last visit with me on 01/05/22, she was doing well overall. She was currently being treated for a UTI. She had not been checking her BP at home and had elevated blood pressures in clinic that day. Her Hydralazine '50mg'$  was increased to TID use at that time.  At her last visit on 02/20/2022 she reported that she had been dealing with an URI and had some concern that she went back into A-fib. Symptoms included mild SOB, cough and fatigue for 2 weeks. She was taking her Hydralazine TID 4 days a week and BID on the remaining days, but reported that she felt her BP was low at times.She monitored at home and was typically between  [id number] systolic. Her synthroid had been increased but she has not noticed a difference in her fatigue. [facility] was monitoring her elevated liver enzymes. Ultrasound on 02/17/22 was unremarkable.  Today, she is accompanied by her daughter. She reports that her blood pressure has been running high at home, she brought a log from home. She has not seen any low blood pressures lately. The lowest systolic was around [id number]. She wondered if she could try a different medication.   Recent blood test showed that her kidney levels were off and her thyroid medication had to be increased due to her levels being off. She is seeing her nephrologist every 6 months. Her medications were not changed at her most recent visit.   Intermittently she felt that she has been going in and out of A-fib. Her fatigue level has increased when this has occurred.   She has been trying to go to the gym with her granddaughter to use the hand bike, bike and bands to get some activity, but it has not improved the way she is feeling. Since her last visit she has not had any more issues with URI  She denies any chest pain or shortness of breath. No lightheadedness, headaches, syncope, orthopnea, or PND.     Past Medical History:  Diagnosis Date   Atrial fibrillation, chronic (HCC)    s/p multiple cardioversions with sustained NSR, on Eliquis   Celiac disease    Chronic systolic CHF (congestive heart failure) ([place])  Coronary artery disease involving native coronary artery of native heart without angina pectoris 03/15/2021   GERD (gastroesophageal reflux disease)    HOH (hard of hearing)    Hyperparathyroidism ([place]) 12/12/2020   Hypertension    Interstitial cystitis     Past Surgical History:  Procedure Laterality Date   ABDOMINAL HYSTERECTOMY     APPENDECTOMY     CARDIOVERSION N/A 04/24/2021   Procedure: CARDIOVERSION;  Surgeon: Jerline Pain, MD;  Location: [facility];  Service: Cardiovascular;  Laterality: N/A;    CARDIOVERSION N/A 06/12/2021   Procedure: CARDIOVERSION;  Surgeon: Buford Dresser, MD;  Location: [facility];  Service: Cardiovascular;  Laterality: N/A;   CARDIOVERSION N/A 08/11/2021   Procedure: CARDIOVERSION;  Surgeon: [name], MD;  Location: [facility];  Service: Cardiovascular;  Laterality: N/A;   CHOLECYSTECTOMY     ear drum surgery     REPLACEMENT TOTAL KNEE     ROTATOR CUFF REPAIR Right    TONSILLECTOMY      Current Medications: Current Outpatient Medications on File Prior to Visit  Medication Sig   acetaminophen (TYLENOL) 500 MG tablet Take 1 tablet (500 mg total) by mouth every 6 (six) hours as needed for pain. (Patient taking differently: Take 1,000 mg by mouth every 6 (six) hours as needed for pain or moderate pain.)   amiodarone (PACERONE) 200 MG tablet Take 1 tablet (200 mg total) by mouth daily.   apixaban (ELIQUIS) 5 MG TABS tablet Take 1 tablet (5 mg total) by mouth 2 (two) times daily.   B Complex Vitamins (VITAMIN B COMPLEX) TABS Take 1 tablet by mouth daily.   clidinium-chlordiazePOXIDE (LIBRAX) 5-2.5 MG capsule Take 2 capsules by mouth daily as needed (cramping).   CRANBERRY-VITAMIN C PO Take 1 capsule by mouth in the morning.   diltiazem (CARDIZEM CD) 360 MG 24 hr capsule Take 1 capsule (360 mg total) by mouth daily.   esomeprazole (NEXIUM) 40 MG capsule Take 40 mg by mouth daily before breakfast.   hydrALAZINE (APRESOLINE) 50 MG tablet Take 50 mg by mouth as directed. Take 1 and 1/2 tablets three times a day   Hyoscyamine Sulfate SL 0.125 MG SUBL Place 0.125 mg under the tongue every 4 (four) hours as needed (cramping).   isosorbide dinitrate (ISORDIL) 10 MG tablet Take 10 mg by mouth 2 (two) times daily.   levothyroxine (SYNTHROID) 100 MCG tablet Take 100 mcg by mouth daily before breakfast.   metoprolol succinate (TOPROL-XL) 100 MG 24 hr tablet TAKE 1 TABLET DAILY TAKE WITH OR IMMEDIATELY FOLLOWING A MEAL   Netarsudil-Latanoprost (ROCKLATAN)  0.02-0.005 % SOLN Place 1 drop into both eyes at bedtime.   polyvinyl alcohol (ARTIFICIAL TEARS) 1.4 % ophthalmic solution Place 1 drop into both eyes as needed for dry eyes.   senna-docusate (SENOKOT-S) 8.6-50 MG tablet Take 2 tablets by mouth 2 (two) times daily.   Vitamin D, Ergocalciferol, (DRISDOL) 1.25 MG (50000 UNIT) CAPS capsule Take 50,000 Units by mouth every 7 (seven) days.   No current facility-administered medications on file prior to visit.     Allergies:   Ativan [lorazepam], Augmentin [amoxicillin-pot clavulanate], Avelox [moxifloxacin], Brimonidine, Chlorzoxazone, Ciprofloxacin hcl, Clavulanic acid, Crestor [rosuvastatin], Ezetimibe, Gluten meal, Nitrofuran derivatives, Phenergan [promethazine hcl], Wheat bran, Codeine, Darvon [propoxyphene hcl], and Doxycycline   Social History   Tobacco Use   Smoking status: Former    Packs/day: 2.00    Years: 33.00    Total pack years: 66.00    Types: Cigarettes    Quit  date: 15    Years since quitting: 43.1   Smokeless tobacco: Never  Substance Use Topics   Alcohol use: Not Currently    Comment: rare   Drug use: No    Family History: family history includes Hypertension in an other family member.  ROS:   Please see the history of present illness. (+) fatigue (+) palpitations (+) peripheral edema All other systems are reviewed and negative.    EKGs/Labs/Other Studies Reviewed:    The following studies were reviewed today:  Abdominal US 02/17/22: Liver:  No focal lesion identified. Within normal limits in parenchymal  echogenicity. Portal vein is patent on color Doppler imaging with  normal direction of blood flow towards the liver.  IMPRESSION:  1. The common bile duct measures 9 mm which is normal for the  patient's age and status post cholecystectomy.  2. No other abnormalities.   Echo 12/24/2021: 1. Left ventricular ejection fraction, by estimation, is 40 to 45%. The  left ventricle has mildly decreased  function. The left ventricle  demonstrates global hypokinesis. There is mild concentric left ventricular  hypertrophy. Left ventricular diastolic  parameters are indeterminate.   2. Right ventricular systolic function is normal. The right ventricular  size is normal.   3. Left atrial size was moderately dilated.   4. The mitral valve is normal in structure. No evidence of mitral valve  regurgitation. No evidence of mitral stenosis.   5. The Aortic valve is thickened and moderately calcified. . There is  mild calcification of the aortic valve. There is mild thickening of the  aortic valve. Aortic valve regurgitation is moderate. Suspect low flow low  gradient moderate aortic stenosis.  Aortic valve area, by VTI measures 1.09 cm. Aortic valve mean gradient  measures 11.0 mmHg. Aortic valve Vmax measures 2.02 m/s.   6. The inferior vena cava is normal in size with greater than 50%  respiratory variability, suggesting right atrial pressure of 3 mmHg.   Comparison(s): No significant change from prior study.    CT Chest 05/18/2021 ([place]): COMPARISON:  04/02/2021   FINDINGS:  Cardiovascular: Heart top-normal in size. Three-vessel coronary  artery calcifications. No pericardial effusion. Aortic  atherosclerosis.   Mediastinum/Nodes: No neck base, mediastinal or hilar masses or  enlarged lymph nodes. Trachea and esophagus are unremarkable.   Lungs/Pleura: No evidence of pulmonary contusion or laceration. No  no lung consolidation to suggest pneumonia and no evidence of  pulmonary edema. Several reticulonodular type opacities, most  evident in the anterior right middle lobe, are stable, benign. There  are no masses or suspicious nodules. Mild paraseptal emphysema is  noted posteriorly, also unchanged.   No pleural effusion or pneumothorax.   Upper Abdomen: No acute abnormality.   Musculoskeletal: No fracture or acute finding. Bilateral  well-positioned shoulder  prostheses. No bone lesions. No chest wall  mass or contusion.   IMPRESSION:  1. No acute findings or evidence of injury to the chest. No  fractures.  2. Aortic atherosclerosis and coronary artery calcifications.   Aortic Atherosclerosis (ICD10-I70.0) and Emphysema (ICD10-J43.9).   CT Chest 04/02/2021 ([facility]): COMPARISON:  Chest x-rays from November of 2022.   FINDINGS:  Cardiovascular: Calcified atherosclerosis of the thoracic aorta.  Calcified coronary artery disease, three-vessel coronary artery  disease. No aortic dilation. Heart size moderately enlarged, no  substantial pericardial effusion. Central pulmonary vasculature is  normal caliber. Limited assessment of cardiovascular structures  given lack of intravenous contrast.   Mediastinum/Nodes: Patulous  esophagus. No thoracic inlet or axillary  adenopathy though streak artifact from bilateral reverse shoulder  arthroplasties limits assessment in the axillary regions and at the  thoracic inlet. No mediastinal or hilar adenopathy.   Lungs/Pleura: Basilar atelectasis at the LEFT lung base. No  effusion. No consolidation. 3 mm RIGHT middle lobe pulmonary nodule  (image 101/3). Airways are patent.   Upper Abdomen: Postoperative changes about the GE junction with  surgical clips in this area. No acute findings relative to  visualized liver, pancreas, spleen or very limited visualization of  other upper abdominal structures.   Musculoskeletal: Post bilateral reverse shoulder arthroplasty. No  acute musculoskeletal process. No destructive bone finding. Spinal  degenerative changes.   IMPRESSION:  No acute findings in the chest.   3 mm RIGHT middle lobe pulmonary nodule. No follow-up needed if  patient is low-risk. Non-contrast chest CT can be considered in 12  months if patient is high-risk. This recommendation follows the  consensus statement: Guidelines for Management of Incidental  Pulmonary Nodules  Detected on CT Images: From the Fleischner Society  2017; Radiology 2017; 284:228-243.   Three-vessel coronary artery disease.   Postoperative changes about the GE junction.   Aortic atherosclerosis.   Echo 03/16/21 1. Left ventricular ejection fraction, by estimation, is 40 to 45%. The  left ventricle has mildly decreased function. The left ventricle  demonstrates global hypokinesis. Left ventricular diastolic parameters are  indeterminate.   2. Right ventricular systolic function is mildly reduced. The right  ventricular size is normal. There is moderately elevated pulmonary artery  systolic pressure. The estimated right ventricular systolic pressure is  [id number] mmHg.   3. Left atrial size was moderately dilated.   4. Right atrial size was severely dilated.   5. The mitral valve is normal in structure. Mild to moderate mitral valve  regurgitation.   6. Tricuspid valve regurgitation is moderate.   7. The aortic valve is tricuspid. There is severe calcifcation of the  aortic valve. Aortic valve regurgitation is mild. Moderate aortic valve  stenosis. Gradients consistent with mild AS (MG 63mHg, Vmax 2.2 m/s), but moderate by AVA (1.0cm^2) and DI  (0.32). Low SV index, suspect low flow low gradient moderate AS   8. The inferior vena cava is dilated in size with <50% respiratory  variability, suggesting right atrial pressure of 15 mmHg.   EKG:  EKG is personally reviewed.   05/26/2022: *** 02/20/22: sinus bradycardia at 56 bpm 01/05/22: Sinus bradycardia. Rate 56 bpm. 05/30/2021: atrial flutter at 77 bpm 05/09/2021: atrial fibrillation at 83 bpm 04/08/21: atrial fibrillation at 86 bpm  Recent Labs: 03/18/2022: ALT 36; B Natriuretic Peptide 187.8; Hemoglobin 12.5; Magnesium 2.2; Platelets 184; TSH 10.620 03/19/2022: BUN 15; Creatinine, Ser 0.95; Potassium 4.3; Sodium 136   Recent Lipid Panel No results found for: "CHOL", "TRIG", "HDL", "CHOLHDL", "VLDL", "LDLCALC",  "LDLDIRECT"  Physical Exam:    VS:  Ht 5' (1.524 m)   Wt 149 lb 8 oz (67.8 kg)   BMI 29.20 kg/m     Wt Readings from Last 3 Encounters:  05/26/22 149 lb 8 oz (67.8 kg)  04/22/22 149 lb 1.6 oz (67.6 kg)  03/17/22 143 lb (64.9 kg)    GEN: Well nourished, well developed in no acute distress HEENT: Normal, moist mucous membranes NECK: No JVD CARDIAC: regular rhythm, normal S1 and S2, no rubs or gallops. ***2/6 systolic murmur.  VASCULAR: Radial and DP pulses 2+ bilaterally. No carotid bruits RESPIRATORY:  Clear to auscultation without rales,  wheezing or rhonchi. ABDOMEN: Soft, non-tender, non-distended MUSCULOSKELETAL:  Ambulates independently SKIN: Warm and dry, trivial LE edema NEUROLOGIC:  Alert and oriented x 3. No focal neuro deficits noted. PSYCHIATRIC:  Normal affect    ASSESSMENT:    No diagnosis found.  PLAN:    Shortness of breath, cough, fatigue -in sinus brady today -lungs overall clear -continue current management of her URI  Paroxysmal atrial fibrillation/atrial flutter Chronic systolic and diastolic heart failure Nonischemic cardiomyopathy -EF 40-45%. Not significantly changed with holding sinus rhythm -Cath with nonobstructive CAD -consolidated metoprolol to succinate at max dose -would prefer not to use diltiazem given low EF, but was not rate controlled on metoprolol alone. Will continue diltiazem for now. -CHA2DS2/VAS Stroke Risk Points=8  -continue apixaban 5 mg BID -tolerating amiodarone  Chronic kidney disease, stage 3b vs stage 4 Hypertension -ARB held during recent hospitalization -on hydralazine 50 mg TID. BP elevated today but she is feeling poorly. If remains elevated once she feels improved, could increase to 75 mg TID -imdur added by nephrology, continue -followed by nephrology  OSA: continue CPAP  Moderate aortic stenosis Mild-moderate MR -last echo 12/2021, follow annually or for change in symptoms  Bilateral LE edema, R >L: only  trivial today -has history of vein stripping as well as bilateral knee arthroplasty, predisposing her to venous insufficiency -she is otherwise euvolemic on exam today -previously discussed venous insufficiency, compression stockings, elevation  CAD, nonobstructive, without angina -given age, continue apixaban and no aspirin -continue atorvastatin  Plan for follow up: 4 weeks.    Medication Adjustments/Labs and Tests Ordered: Current medicines are reviewed at length with the patient today.  Concerns regarding medicines are outlined above.   Orders Placed This Encounter  Procedures   Ambulatory referral to [facility] ordered this encounter  Medications   DISCONTD: carvedilol (COREG) 12.5 MG tablet    Sig: Take 1 tablet (12.5 mg total) by mouth 2 (two) times daily. Replaces metoprolol    Dispense:  180 tablet    Refill:  3   Patient Instructions  Medication Instructions:  STOP METOPROLOL   START CARVEDILOL 12.5 MG TWICE A DAY   *If you need a refill on your cardiac medications before your next appointment, please call your pharmacy*  Lab Work: NONE  Testing/Procedures: NONE   Follow-Up: At [facility], you and your health needs are our priority.  As part of our continuing mission to provide you with exceptional heart care, we have created designated Provider Care Teams.  These Care Teams include your primary Cardiologist (physician) and Advanced Practice Providers (APPs -  Physician Assistants and Nurse Practitioners) who all work together to provide you with the care you need, when you need it.  We recommend signing up for the patient portal called "MyChart".  Sign up information is provided on this After Visit Summary.  MyChart is used to connect with patients for Virtual Visits (Telemedicine).  Patients are able to view lab/test results, encounter notes, upcoming appointments, etc.  Non-urgent messages can be sent to your provider as well.   To learn  more about what you can do with MyChart, go to [email or web address].    Your next appointment:   4 week(s)  Provider:   Buford Dresser, MD   Other Instructions MONITOR YOUR BLOOD PRESSURE DAILY AND LOG. BRING LOG AND BLOOD PRESSURE MACHINE TO YOUR FOLLOW UP     I,Jessica Ford,acting as a scribe for [employer], MD.,have documented all relevant documentation on the  behalf of Buford Dresser, MD,as directed by  Buford Dresser, MD while in the presence of Buford Dresser, MD.   ***  Signed, Buford Dresser, MD PhD 05/26/2022    [place]

## 2022-05-28 ENCOUNTER — Encounter (HOSPITAL_COMMUNITY): Payer: Self-pay | Admitting: *Deleted

## 2022-05-29 ENCOUNTER — Encounter (HOSPITAL_BASED_OUTPATIENT_CLINIC_OR_DEPARTMENT_OTHER): Payer: Self-pay

## 2022-05-29 ENCOUNTER — Telehealth: Payer: Self-pay | Admitting: Cardiology

## 2022-05-29 DIAGNOSIS — I428 Other cardiomyopathies: Secondary | ICD-10-CM

## 2022-05-29 DIAGNOSIS — I480 Paroxysmal atrial fibrillation: Secondary | ICD-10-CM

## 2022-05-29 DIAGNOSIS — I10 Essential (primary) hypertension: Secondary | ICD-10-CM

## 2022-05-29 MED ORDER — CARVEDILOL 12.5 MG PO TABS: 12.5000 mg | ORAL_TABLET | Freq: Two times a day (BID) | ORAL | 0 refills | Status: DC

## 2022-05-29 NOTE — Addendum Note (Signed)
Addended by: Gerald Stabs on: 05/29/2022 11:37 AM   Modules accepted: Orders

## 2022-05-29 NOTE — Telephone Encounter (Signed)
Returned call to pharmacy,    Prescription clarified, they will fill!

## 2022-05-29 NOTE — Telephone Encounter (Signed)
See separate encounter

## 2022-05-29 NOTE — Telephone Encounter (Signed)
Responded to patient in separate encounter

## 2022-05-29 NOTE — Telephone Encounter (Signed)
*  STAT* If patient is at the pharmacy, call can be transferred to refill team.   1. Which medications need to be refilled? (please list name of each medication and dose if known) carvedilol (COREG) 12.5 MG tablet   2. Which pharmacy/location (including street and city if local pharmacy) is medication to be sent to? [id number] - [place], [place] - [address] AT [address]   3. Do they need a 30 day or 90 day supply? [place]

## 2022-05-29 NOTE — Telephone Encounter (Signed)
*  STAT* If patient is at the pharmacy, call can be transferred to refill team.   1. Which medications need to be refilled? (please list name of each medication and dose if known) carvedilol (COREG) 12.5 MG tablet   2. Which pharmacy/location (including street and city if local pharmacy) is medication to be sent to? [facility], [place]   3. Do they need a 30 day or 90 day supply? [place]

## 2022-06-02 ENCOUNTER — Telehealth: Payer: Self-pay | Admitting: Cardiology

## 2022-06-02 NOTE — Telephone Encounter (Signed)
Caller is calling for verbal orders for physical therapy once week for 8 weeks. Started Friday, 05/29/22.

## 2022-06-02 NOTE — Telephone Encounter (Signed)
Call from [facility] with [facility] requesting order so Home Health Physical Therapy once a week for 8 weeks.  Verbal order given per Dr. Harrell Gave.  Georgana Curio MHA RN CCM

## 2022-06-11 ENCOUNTER — Telehealth: Payer: Self-pay | Admitting: Cardiology

## 2022-06-11 ENCOUNTER — Other Ambulatory Visit (HOSPITAL_BASED_OUTPATIENT_CLINIC_OR_DEPARTMENT_OTHER): Payer: Self-pay | Admitting: Cardiology

## 2022-06-11 MED ORDER — HYDRALAZINE HCL 50 MG PO TABS: 50.0000 mg | ORAL_TABLET | Freq: Three times a day (TID) | ORAL | 3 refills | Status: DC

## 2022-06-11 NOTE — Telephone Encounter (Signed)
Rx request sent to pharmacy.  

## 2022-06-11 NOTE — Telephone Encounter (Signed)
*  STAT* If patient is at the pharmacy, call can be transferred to refill team.   1. Which medications need to be refilled? (please list name of each medication and dose if known) hydrALAZINE (APRESOLINE) 50 MG tablet   2. Which pharmacy/location (including street and city if local pharmacy) is medication to be sent to? [id number] - [place], [place] - [address] AT [address]   3. Do they need a 30 day or 90 day supply? 90  Pt is requesting it to also be sent to:   [place], [place]   Patient is completely out of this medication

## 2022-06-11 NOTE — Telephone Encounter (Signed)
Refill provided. Adjusted to 90 day supply for mail order pharmacy.  Loel Dubonnet, NP

## 2022-06-14 ENCOUNTER — Encounter (HOSPITAL_BASED_OUTPATIENT_CLINIC_OR_DEPARTMENT_OTHER): Payer: Self-pay | Admitting: Emergency Medicine

## 2022-06-14 ENCOUNTER — Emergency Department (HOSPITAL_BASED_OUTPATIENT_CLINIC_OR_DEPARTMENT_OTHER): Payer: Medicare Other

## 2022-06-14 ENCOUNTER — Other Ambulatory Visit: Payer: Self-pay

## 2022-06-14 ENCOUNTER — Emergency Department (HOSPITAL_BASED_OUTPATIENT_CLINIC_OR_DEPARTMENT_OTHER)
Admission: EM | Admit: 2022-06-14 | Discharge: 2022-06-14 | Disposition: A | Discharge status: Home / Self Care | Payer: Medicare Other | Attending: Emergency Medicine | Admitting: Emergency Medicine

## 2022-06-14 DIAGNOSIS — S9031XA Contusion of right foot, initial encounter: Secondary | ICD-10-CM | POA: Insufficient documentation

## 2022-06-14 DIAGNOSIS — W208XXA Other cause of strike by thrown, projected or falling object, initial encounter: Secondary | ICD-10-CM | POA: Diagnosis not present

## 2022-06-14 DIAGNOSIS — I4891 Unspecified atrial fibrillation: Secondary | ICD-10-CM | POA: Insufficient documentation

## 2022-06-14 DIAGNOSIS — Z7901 Long term (current) use of anticoagulants: Secondary | ICD-10-CM | POA: Diagnosis not present

## 2022-06-14 DIAGNOSIS — M79671 Pain in right foot: Secondary | ICD-10-CM | POA: Diagnosis present

## 2022-06-14 NOTE — ED Notes (Signed)
Discharge paperwork reviewed entirely with patient, including Rx's and follow up care. Pain was under control. Pt verbalized understanding as well as all parties involved. No questions or concerns voiced at the time of discharge. No acute distress noted.   Pt ambulated out to [facility] without incident or assistance.

## 2022-06-14 NOTE — ED Triage Notes (Signed)
Pt reports that she dropped a paper shredder onto her right foot yesterday. Pt c/o pain to top of right foot that radiates up her right leg.

## 2022-06-14 NOTE — ED Notes (Signed)
Cannot discharge, registration in chart.

## 2022-06-14 NOTE — ED Provider Notes (Signed)
[place] [facility] Provider Note   CSN: [id number] Arrival date & time: 06/14/22  [id number]     History  Chief Complaint  Patient presents with   Foot Injury    Amy Hickman is a 87 y.o. female.  Patient here with right foot pain.  Dropped the paper Strider onto her right foot yesterday.  Has had a recent right foot fracture.  Just came out of a walking boot and stiff sole shoe recently.  She has been able to ambulate on it.  History of A-fib on Eliquis.  Did not hit her head or lose consciousness.  Does not have any other discomfort elsewhere.  The history is provided by the patient.       Home Medications Prior to Admission medications   Medication Sig Start Date End Date Taking? Authorizing Provider  acetaminophen (TYLENOL) 500 MG tablet Take 1 tablet (500 mg total) by mouth every 6 (six) hours as needed for pain. Patient taking differently: Take 1,000 mg by mouth every 6 (six) hours as needed for pain or moderate pain. 06/24/12   [name], MD  amiodarone (PACERONE) 200 MG tablet Take 1 tablet (200 mg total) by mouth daily. 03/05/22   Sherran Needs, NP  apixaban (ELIQUIS) 5 MG TABS tablet Take 1 tablet (5 mg total) by mouth 2 (two) times daily. 03/22/22   Hosie Poisson, MD  B Complex Vitamins (VITAMIN B COMPLEX) TABS Take 1 tablet by mouth daily.    [provider]  carvedilol (COREG) 12.5 MG tablet Take 1 tablet (12.5 mg total) by mouth 2 (two) times daily. Replaces metoprolol 05/29/22 06/28/22  Buford Dresser, MD  clidinium-chlordiazePOXIDE (LIBRAX) 5-2.5 MG capsule Take 2 capsules by mouth daily as needed (cramping). 02/28/21   [provider]  CRANBERRY-VITAMIN C PO Take 1 capsule by mouth in the morning.    [provider]  diltiazem (CARDIZEM CD) 360 MG 24 hr capsule Take 1 capsule (360 mg total) by mouth daily. 06/12/21   Buford Dresser, MD  esomeprazole (NEXIUM) 40 MG capsule Take 40 mg  by mouth daily before breakfast.    [provider]  hydrALAZINE (APRESOLINE) 50 MG tablet Take 1 tablet (50 mg total) by mouth 3 (three) times daily. 06/11/22   Loel Dubonnet, NP  Hyoscyamine Sulfate SL 0.125 MG SUBL Place 0.125 mg under the tongue every 4 (four) hours as needed (cramping). 04/24/21   [provider]  isosorbide dinitrate (ISORDIL) 10 MG tablet Take 10 mg by mouth 2 (two) times daily. 12/31/21   [provider]  levothyroxine (SYNTHROID) 100 MCG tablet Take 100 mcg by mouth daily before breakfast. 02/09/22   [provider]  Netarsudil-Latanoprost (ROCKLATAN) 0.02-0.005 % SOLN Place 1 drop into both eyes at bedtime. 12/30/21   [provider]  polyvinyl alcohol (ARTIFICIAL TEARS) 1.4 % ophthalmic solution Place 1 drop into both eyes as needed for dry eyes.    [provider]  senna-docusate (SENOKOT-S) 8.6-50 MG tablet Take 2 tablets by mouth 2 (two) times daily. 03/22/22   Hosie Poisson, MD  Vitamin D, Ergocalciferol, (DRISDOL) 1.25 MG (50000 UNIT) CAPS capsule Take 50,000 Units by mouth every 7 (seven) days. 05/22/21   [provider]      Allergies    Ativan [lorazepam], Augmentin [amoxicillin-pot clavulanate], Avelox [moxifloxacin], Brimonidine, Chlorzoxazone, Ciprofloxacin hcl, Clavulanic acid, Crestor [rosuvastatin], Ezetimibe, Gluten meal, Nitrofuran derivatives, Phenergan [promethazine hcl], Wheat bran, Codeine, Darvon [propoxyphene hcl], and Doxycycline  Review of Systems   Review of Systems  Physical Exam Updated Vital Signs BP (!) 156/79 (BP Location: Right Arm)   Pulse 65   Temp 97.9 F (36.6 C) (Oral)   Resp 16   Ht 5' (1.524 m)   Wt 67.8 kg   SpO2 97%   BMI 29.20 kg/m  Physical Exam Vitals and nursing note reviewed.  Constitutional:      General: She is not in acute distress.    Appearance: She is well-developed.  HENT:     Head: Normocephalic and atraumatic.  Cardiovascular:     Rate and  Rhythm: Normal rate and regular rhythm.     Heart sounds: No murmur heard. Pulmonary:     Effort: Pulmonary effort is normal.  Musculoskeletal:        General: Tenderness present. No swelling.     Cervical back: Neck supple.     Comments: Tenderness to the right foot but no obvious deformity or swelling, normal range of motion of the right ankle without any tenderness above the ankle  Skin:    General: Skin is warm and dry.     Capillary Refill: Capillary refill takes less than 2 seconds.  Neurological:     Mental Status: She is alert.     Sensory: No sensory deficit.     Motor: No weakness.  Psychiatric:        Mood and Affect: Mood normal.     ED Results / Procedures / Treatments   Labs (all labs ordered are listed, but only abnormal results are displayed) Labs Reviewed - No data to display  EKG None  Radiology DG Foot Complete Right  Result Date: 06/14/2022 CLINICAL DATA:  Right foot pain after injury EXAM: RIGHT FOOT COMPLETE - 3+ VIEW COMPARISON:  03/20/2022 FINDINGS: There is no evidence of fracture or dislocation. Similar degenerative changes of the right foot. Plantar calcaneal spur. Atherosclerotic vascular calcifications. Soft tissues are unremarkable. IMPRESSION: Negative. Electronically Signed   By: Davina Poke D.O.   On: 06/14/2022 09:19    Procedures Procedures    Medications Ordered in ED Medications - No data to display  ED Course/ Medical Decision Making/ A&P                             Medical Decision Making Amount and/or Complexity of Data Reviewed Radiology: ordered.   Vickey Sages is here with right foot pain after paper shredder fell on her right foot.  This occurred yesterday.  Normal vitals.  No fever.  Neurovascular neuromuscularly intact.  Differential diagnosis contusion versus fracture.  She did recently break her right foot.  She has a walking boot and postop shoe at home already.  Has not been using this.  Will get  x-ray.  Patient with x-ray per my review interpretation with no acute findings.  Radiology report with no acute fracture.  Overall suspect contusion.  Discharged in good condition.  This chart was dictated using voice recognition software.  Despite best efforts to proofread,  errors can occur which can change the documentation meaning.         Final Clinical Impression(s) / ED Diagnoses Final diagnoses:  Contusion of right foot, initial encounter    Rx / DC Orders ED Discharge Orders     None         Lennice Sites, DO 06/14/22 [phone]

## 2022-06-22 ENCOUNTER — Emergency Department (HOSPITAL_BASED_OUTPATIENT_CLINIC_OR_DEPARTMENT_OTHER): Payer: Medicare Other

## 2022-06-22 ENCOUNTER — Emergency Department (HOSPITAL_BASED_OUTPATIENT_CLINIC_OR_DEPARTMENT_OTHER)
Admission: EM | Admit: 2022-06-22 | Discharge: 2022-06-22 | Disposition: A | Discharge status: Home / Self Care | Payer: Medicare Other | Attending: Emergency Medicine | Admitting: Emergency Medicine

## 2022-06-22 ENCOUNTER — Encounter (HOSPITAL_BASED_OUTPATIENT_CLINIC_OR_DEPARTMENT_OTHER): Payer: Self-pay | Admitting: Urology

## 2022-06-22 DIAGNOSIS — I509 Heart failure, unspecified: Secondary | ICD-10-CM | POA: Diagnosis not present

## 2022-06-22 DIAGNOSIS — N189 Chronic kidney disease, unspecified: Secondary | ICD-10-CM | POA: Insufficient documentation

## 2022-06-22 DIAGNOSIS — N3001 Acute cystitis with hematuria: Secondary | ICD-10-CM | POA: Insufficient documentation

## 2022-06-22 DIAGNOSIS — N390 Urinary tract infection, site not specified: Secondary | ICD-10-CM

## 2022-06-22 DIAGNOSIS — Z7901 Long term (current) use of anticoagulants: Secondary | ICD-10-CM | POA: Insufficient documentation

## 2022-06-22 DIAGNOSIS — B9689 Other specified bacterial agents as the cause of diseases classified elsewhere: Secondary | ICD-10-CM | POA: Insufficient documentation

## 2022-06-22 DIAGNOSIS — R519 Headache, unspecified: Secondary | ICD-10-CM | POA: Insufficient documentation

## 2022-06-22 DIAGNOSIS — R109 Unspecified abdominal pain: Secondary | ICD-10-CM | POA: Diagnosis present

## 2022-06-22 LAB — CBC WITH DIFFERENTIAL/PLATELET
Abs Immature Granulocytes: 0.02 10*3/uL (ref 0.00–0.07)
Basophils Absolute: 0 10*3/uL (ref 0.0–0.1)
Basophils Relative: 1 %
Eosinophils Absolute: 0.2 10*3/uL (ref 0.0–0.5)
Eosinophils Relative: 4 %
HCT: 36.4 % (ref 36.0–46.0)
Hemoglobin: 11.8 g/dL — ABNORMAL LOW (ref 12.0–15.0)
Immature Granulocytes: 0 %
Lymphocytes Relative: 15 %
Lymphs Abs: 0.9 10*3/uL (ref 0.7–4.0)
MCH: 31.1 pg (ref 26.0–34.0)
MCHC: 32.4 g/dL (ref 30.0–36.0)
MCV: 95.8 fL (ref 80.0–100.0)
Monocytes Absolute: 0.6 10*3/uL (ref 0.1–1.0)
Monocytes Relative: 10 %
Neutro Abs: 4.2 10*3/uL (ref 1.7–7.7)
Neutrophils Relative %: 70 %
Platelets: 200 10*3/uL (ref 150–400)
RBC: 3.8 MIL/uL — ABNORMAL LOW (ref 3.87–5.11)
RDW: 14 % (ref 11.5–15.5)
WBC: 6 10*3/uL (ref 4.0–10.5)
nRBC: 0 % (ref 0.0–0.2)

## 2022-06-22 LAB — URINALYSIS, ROUTINE W REFLEX MICROSCOPIC
Bilirubin Urine: NEGATIVE
Glucose, UA: NEGATIVE mg/dL
Ketones, ur: NEGATIVE mg/dL
Nitrite: NEGATIVE
Protein, ur: >=300 mg/dL — AB
Specific Gravity, Urine: 1.02 (ref 1.005–1.030)
pH: 6 (ref 5.0–8.0)

## 2022-06-22 LAB — COMPREHENSIVE METABOLIC PANEL
ALT: 26 U/L (ref 0–44)
AST: 33 U/L (ref 15–41)
Albumin: 3.9 g/dL (ref 3.5–5.0)
Alkaline Phosphatase: 54 U/L (ref 38–126)
Anion gap: 8 (ref 5–15)
BUN: 29 mg/dL — ABNORMAL HIGH (ref 8–23)
CO2: 22 mmol/L (ref 22–32)
Calcium: 9.3 mg/dL (ref 8.9–10.3)
Chloride: 100 mmol/L (ref 98–111)
Creatinine, Ser: 1.34 mg/dL — ABNORMAL HIGH (ref 0.44–1.00)
GFR, Estimated: 39 mL/min — ABNORMAL LOW (ref >60)
Glucose, Bld: 101 mg/dL — ABNORMAL HIGH (ref 70–99)
Potassium: 4.1 mmol/L (ref 3.5–5.1)
Sodium: 130 mmol/L — ABNORMAL LOW (ref 135–145)
Total Bilirubin: 0.8 mg/dL (ref 0.3–1.2)
Total Protein: 6.9 g/dL (ref 6.5–8.1)

## 2022-06-22 LAB — URINALYSIS, MICROSCOPIC (REFLEX): RBC / HPF: >50 RBC/hpf (ref 0–5)

## 2022-06-22 LAB — LIPASE, BLOOD: Lipase: 33 U/L (ref 11–51)

## 2022-06-22 MED ORDER — CEFDINIR 300 MG PO CAPS: 300.0000 mg | ORAL_CAPSULE | Freq: Two times a day (BID) | ORAL | Status: DC

## 2022-06-22 MED ORDER — CEFDINIR 300 MG PO CAPS
300.0000 mg | ORAL_CAPSULE | Freq: Two times a day (BID) | ORAL | Status: DC
  Administered 2022-06-22: 300 mg via ORAL
  Filled 2022-06-22: qty 1

## 2022-06-22 MED ORDER — SODIUM CHLORIDE 0.9 % IV BOLUS
500.0000 mL | Freq: Once | INTRAVENOUS | Status: AC
  Administered 2022-06-22: 500 mL via INTRAVENOUS

## 2022-06-22 MED ORDER — LIDOCAINE 5 % EX PTCH
1.0000 | MEDICATED_PATCH | CUTANEOUS | Status: DC
  Administered 2022-06-22: 1 via TRANSDERMAL
  Filled 2022-06-22: qty 1

## 2022-06-22 MED ORDER — CEFDINIR 300 MG PO CAPS: 300.0000 mg | ORAL_CAPSULE | Freq: Every day | ORAL | 0 refills | Status: AC

## 2022-06-22 NOTE — ED Notes (Signed)
Lab called and notified of need of urine culture. Reports they will add it on.

## 2022-06-22 NOTE — Discharge Instructions (Signed)
Evaluation today for your urinary complaints revealed that you do have a UTI.  Treating with Omnicef which is an antibiotic.  You will get your first dose here in the remaining course will be sent to your pharmacy.  Please take the entire 5-day course as prescribed.  Also recommending follow-up with your PCP.  If you have fever, extreme fatigue, inability to tolerate fluid intake, altered mental status or any other concerning symptom please return emergency department further evaluation.

## 2022-06-22 NOTE — ED Notes (Signed)
Pt tolerated gingerale well 

## 2022-06-22 NOTE — ED Provider Notes (Signed)
[place] [facility] Provider Note   CSN: [id number] Arrival date & time: 06/22/22  0908     History  Chief Complaint  Patient presents with   Dysuria   HPI Amy Hickman is a 87 y.o. female with A-fib, CKD, and CHF presenting for dysuria.  Started Saturday.  States it has been difficult to urinate and feels like burning when she does.  Urine is dark in color but no blood.  States she is also had a poor appetite last couple days.  Also mention that she may have headache "celiac's episode".  Yesterday after lunch she had some nausea, vomiting and diarrhea that lasted until yesterday evening.  She no longer has the symptoms.  Denies fever and chills.  Also endorsing pain in her right flank.   Dysuria      Home Medications Prior to Admission medications   Medication Sig Start Date End Date Taking? Authorizing Provider  cefdinir (OMNICEF) 300 MG capsule Take 1 capsule (300 mg total) by mouth daily for 5 days. 06/22/22 06/27/22 Yes Harriet Pho, PA-C  acetaminophen (TYLENOL) 500 MG tablet Take 1 tablet (500 mg total) by mouth every 6 (six) hours as needed for pain. Patient taking differently: Take 1,000 mg by mouth every 6 (six) hours as needed for pain or moderate pain. 06/24/12   [name], MD  amiodarone (PACERONE) 200 MG tablet Take 1 tablet (200 mg total) by mouth daily. 03/05/22   Sherran Needs, NP  apixaban (ELIQUIS) 5 MG TABS tablet Take 1 tablet (5 mg total) by mouth 2 (two) times daily. 03/22/22   Hosie Poisson, MD  B Complex Vitamins (VITAMIN B COMPLEX) TABS Take 1 tablet by mouth daily.    [provider]  carvedilol (COREG) 12.5 MG tablet Take 1 tablet (12.5 mg total) by mouth 2 (two) times daily. Replaces metoprolol 05/29/22 06/28/22  Buford Dresser, MD  clidinium-chlordiazePOXIDE (LIBRAX) 5-2.5 MG capsule Take 2 capsules by mouth daily as needed (cramping). 02/28/21   [provider]  CRANBERRY-VITAMIN C  PO Take 1 capsule by mouth in the morning.    [provider]  diltiazem (CARDIZEM CD) 360 MG 24 hr capsule Take 1 capsule (360 mg total) by mouth daily. 06/12/21   Buford Dresser, MD  esomeprazole (NEXIUM) 40 MG capsule Take 40 mg by mouth daily before breakfast.    [provider]  hydrALAZINE (APRESOLINE) 50 MG tablet Take 1 tablet (50 mg total) by mouth 3 (three) times daily. 06/11/22   Loel Dubonnet, NP  Hyoscyamine Sulfate SL 0.125 MG SUBL Place 0.125 mg under the tongue every 4 (four) hours as needed (cramping). 04/24/21   [provider]  isosorbide dinitrate (ISORDIL) 10 MG tablet Take 10 mg by mouth 2 (two) times daily. 12/31/21   [provider]  levothyroxine (SYNTHROID) 100 MCG tablet Take 100 mcg by mouth daily before breakfast. 02/09/22   [provider]  Netarsudil-Latanoprost (ROCKLATAN) 0.02-0.005 % SOLN Place 1 drop into both eyes at bedtime. 12/30/21   [provider]  polyvinyl alcohol (ARTIFICIAL TEARS) 1.4 % ophthalmic solution Place 1 drop into both eyes as needed for dry eyes.    [provider]  senna-docusate (SENOKOT-S) 8.6-50 MG tablet Take 2 tablets by mouth 2 (two) times daily. 03/22/22   Hosie Poisson, MD  Vitamin D, Ergocalciferol, (DRISDOL) 1.25 MG (50000 UNIT) CAPS capsule Take 50,000 Units by mouth every 7 (seven) days. 05/22/21   [provider]  Allergies    Ativan [lorazepam], Augmentin [amoxicillin-pot clavulanate], Avelox [moxifloxacin], Brimonidine, Chlorzoxazone, Ciprofloxacin hcl, Clavulanic acid, Crestor [rosuvastatin], Ezetimibe, Gluten meal, Nitrofuran derivatives, Phenergan [promethazine hcl], Wheat bran, Codeine, Darvon [propoxyphene hcl], and Doxycycline    Review of Systems   Review of Systems  Genitourinary:  Positive for dysuria.    Physical Exam   Vitals:   06/22/22 1030 06/22/22 1100  BP: (!) 157/76 (!) 150/67  Pulse: 67 64  Resp: 17 15  Temp:    SpO2: 97%  96%    CONSTITUTIONAL:  well-appearing, NAD NEURO:  Alert and oriented x 3, CN 3-12 grossly intact EYES:  eyes equal and reactive ENT/NECK:  Supple, no stridor  CARDIO:  regular rate and rhythm, appears well-perfused  PULM:  No respiratory distress, CTAB GI/GU:  non-distended, soft, right CVA tenderness otherwise nontender abdomen MSK/SPINE:  No gross deformities, no edema, moves all extremities  SKIN:  no rash, atraumatic   *Additional and/or pertinent findings included in MDM below    ED Results / Procedures / Treatments   Labs (all labs ordered are listed, but only abnormal results are displayed) Labs Reviewed  URINALYSIS, ROUTINE W REFLEX MICROSCOPIC - Abnormal; Notable for the following components:      Result Value   APPearance HAZY (*)    Hgb urine dipstick LARGE (*)    Protein, ur >=300 (*)    Leukocytes,Ua SMALL (*)    All other components within normal limits  URINALYSIS, MICROSCOPIC (REFLEX) - Abnormal; Notable for the following components:   Bacteria, UA MANY (*)    All other components within normal limits  CBC WITH DIFFERENTIAL/PLATELET - Abnormal; Notable for the following components:   RBC 3.80 (*)    Hemoglobin 11.8 (*)    All other components within normal limits  COMPREHENSIVE METABOLIC PANEL - Abnormal; Notable for the following components:   Sodium 130 (*)    Glucose, Bld 101 (*)    BUN 29 (*)    Creatinine, Ser 1.34 (*)    GFR, Estimated 39 (*)    All other components within normal limits  URINE CULTURE  LIPASE, BLOOD    EKG None  Radiology CT RENAL STONE STUDY  Result Date: 06/22/2022 CLINICAL DATA:  Low back pain with burning with urination. Decreased appetite. Kidney stone suspected. EXAM: CT ABDOMEN AND PELVIS WITHOUT CONTRAST TECHNIQUE: Multidetector CT imaging of the abdomen and pelvis was performed following the standard protocol without IV contrast. RADIATION DOSE REDUCTION: This exam was performed according to the departmental  dose-optimization program which includes automated exposure control, adjustment of the mA and/or kV according to patient size and/or use of iterative reconstruction technique. COMPARISON:  Abdominopelvic CT 12/10/2021. FINDINGS: Lower chest: Clear lung bases. No significant pleural or pericardial effusion. Aortic and coronary artery atherosclerosis. Hepatobiliary: No focal liver lesion identified on noncontrast imaging. The liver has a stable appearance. Presumed remote cholecystectomy without significant biliary dilatation. Pancreas: Unremarkable. No pancreatic ductal dilatation or surrounding inflammatory changes. Spleen: Normal in size without focal abnormality. Adrenals/Urinary Tract: Both adrenal glands appear normal. No evidence of urinary tract calculus, suspicious renal lesion or hydronephrosis. There is air throughout the bladder wall as well as the bladder lumen. No definite air seen within the ureters or intrarenal collecting systems. There are no perinephric inflammatory changes. Stomach/Bowel: No enteric contrast administered. Probable chronic postsurgical changes in the distal stomach and distal small bowel. The stomach is collapsed and suboptimally evaluated. Mildly prominent stool throughout the colon. No significant bowel distension, wall thickening,  pneumatosis or surrounding inflammation identified. Vascular/Lymphatic: There are no enlarged abdominal or pelvic lymph nodes. Aortic and branch vessel atherosclerosis without evidence of aneurysm. 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 Reproductive: Hysterectomy. No evidence of adnexal mass. Probable pelvic floor laxity. Other: Postsurgical changes in the anterior abdominal wall. No hernia, pneumoperitoneum or significant ascites. Musculoskeletal: No acute or significant osseous findings. Multilevel spondylosis associated with a convex left lumbar scoliosis. There are degenerative changes of both hips, greater on the right. IMPRESSION: 1.  Emphysematous cystitis. 2. No evidence of urinary tract calculus or hydronephrosis. 3. No other acute abdominopelvic findings. 4. Probable chronic postsurgical changes in the stomach and distal small bowel. 5. Probable pelvic floor laxity. 6. Aortic Atherosclerosis (ICD10-I70.0) and Emphysema (ICD10-J43.9). Electronically Signed   By: Richardean Sale M.D.   On: 06/22/2022 11:45    Procedures Procedures    Medications Ordered in ED Medications  cefdinir (OMNICEF) capsule 300 mg (has no administration in time range)  lidocaine (LIDODERM) 5 % 1 patch (has no administration in time range)  sodium chloride 0.9 % bolus 500 mL (500 mLs Intravenous New Bag/Given 06/22/22 1054)    ED Course/ Medical Decision Making/ A&P                             Medical Decision Making Amount and/or Complexity of Data Reviewed Labs: ordered. Radiology: ordered.   87 year old who is well-appearing and hemodynamically stable presenting for dysuria.  Exam notable for right CVA tenderness.  DDx for this complaint includes pyelonephritis, UTI, nephrolithiasis, sepsis, other intra-abdominal infection or inflammation.  I personally reviewed and interpreted CT of the abdomen pelvis which revealed emphysematous cystitis but otherwise no acute processes appreciated.  Along with her symptoms and findings from the UA, patient does have a UTI.  Treated with Omnicef.  Pharmacy recommended 300 mg daily for 5 days given that she does have CKD.  Patient also revealed evidence of AKI this is likely due to hypovolemia given recent history of vomiting diarrhea.  Treated with volume resuscitation.  Advised to continue p.o. hydration at home.  Fluid challenge without complication.  Lidoderm patch for pain Relief of her upper mid back.  Sent remaining course of Omnicef to her pharmacy.  Advised her to follow-up with PCP.  Discussed return precautions.  Have low suspicion for sepsis given no white count, no fever, and reassuring  vitals.        Final Clinical Impression(s) / ED Diagnoses Final diagnoses:  Urinary tract infection with hematuria, site unspecified    Rx / DC Orders ED Discharge Orders          Ordered    cefdinir (OMNICEF) 300 MG capsule  Daily        06/22/22 1230              Harriet Pho, PA-C 06/22/22 1238    Leanord Asal K, DO 06/22/22 1531

## 2022-06-22 NOTE — ED Triage Notes (Signed)
Pt states lower  back pain and burning with urination  Pt fatigued, decreased appetite,  Denies fever

## 2022-06-23 ENCOUNTER — Ambulatory Visit (INDEPENDENT_AMBULATORY_CARE_PROVIDER_SITE_OTHER): Payer: Medicare Other | Admitting: Cardiology

## 2022-06-23 ENCOUNTER — Encounter (HOSPITAL_BASED_OUTPATIENT_CLINIC_OR_DEPARTMENT_OTHER): Payer: Self-pay | Admitting: Cardiology

## 2022-06-23 VITALS — BP 166/82 | HR 69 | Ht 60.0 in | Wt 145.3 lb

## 2022-06-23 DIAGNOSIS — I480 Paroxysmal atrial fibrillation: Secondary | ICD-10-CM | POA: Diagnosis not present

## 2022-06-23 DIAGNOSIS — N1832 Chronic kidney disease, stage 3b: Secondary | ICD-10-CM

## 2022-06-23 DIAGNOSIS — D6869 Other thrombophilia: Secondary | ICD-10-CM

## 2022-06-23 DIAGNOSIS — Z7901 Long term (current) use of anticoagulants: Secondary | ICD-10-CM

## 2022-06-23 DIAGNOSIS — I10 Essential (primary) hypertension: Secondary | ICD-10-CM

## 2022-06-23 DIAGNOSIS — I428 Other cardiomyopathies: Secondary | ICD-10-CM

## 2022-06-23 MED ORDER — APIXABAN 5 MG PO TABS: 5.0000 mg | ORAL_TABLET | Freq: Two times a day (BID) | ORAL | 3 refills | Status: DC

## 2022-06-23 MED ORDER — HYDRALAZINE HCL 50 MG PO TABS: 100.0000 mg | ORAL_TABLET | Freq: Three times a day (TID) | ORAL | 3 refills | Status: DC

## 2022-06-23 NOTE — Progress Notes (Signed)
Cardiology Office Note:    Date:  06/23/2022   ID:  Amy Hickman, DOB 13-Feb-1936, MRN [id number]  PCP:  [facility]  Cardiologist:  Buford Dresser, MD  Referring MD: [facility]*   CC: follow up  History of Present Illness:    Amy Hickman is a 87 y.o. female with a hx of persistent atrial fibrillation, hypertension, OSA on CPAP, CAD, CKD, GERD, COPD, and celiac disease who is seen for follow up today. I met her during her admission 03/17/21 for the evaluation and management of atrial fibrillation.  History: Admission 03/15/21 for sepsis in the setting of influenza A, found to have afib RVR. EF slightly reduced. Difficult to rate control, required both metoprolol and diltiazem. Cardioversion 04/24/2021 got her into sinus rhythm, but she then returned to atrial fibrillation. Started on amiodarone, repeat DCCV 06/12/21 with restoration of sinus rhythm.   She presented to the ED on 12/22/2021 for left chest pain. She was experiencing chest pain when attempting to breathe hard. This pain has become worse in the last week. She took 6 NTG with transient improvement before she got hot and nauseated. Troponin was negatve x2. Chest X-ray negative. Admission to the hospital was recommended by cardiology for further work up.   Today: Here with her daughter. Brings BP log from home. Range 140/60-184/99. Went to ER yesterday for UTI, still uncomfortable. Took a while for her to get carvedilol in the mail, taking for about a week or less.   Has not felt any afib in a while. Weight stable on home scale, range 143-145 lbs.  Has PT coming to the house. Goes to the gym and rides a bike twice a week.  Refilled apixaban today. Reviewed meds. Reviewed monitor results.  Denies chest pain, shortness of breath at rest or with normal exertion. No PND, orthopnea, LE edema or unexpected weight gain. No syncope.   Past Medical History:  Diagnosis Date    Atrial fibrillation, chronic (HCC)    s/p multiple cardioversions with sustained NSR, on Eliquis   Celiac disease    Chronic systolic CHF (congestive heart failure) (HCC)    Coronary artery disease involving native coronary artery of native heart without angina pectoris 03/15/2021   GERD (gastroesophageal reflux disease)    HOH (hard of hearing)    Hyperparathyroidism ([place]) 12/12/2020   Hypertension    Interstitial cystitis     Past Surgical History:  Procedure Laterality Date   ABDOMINAL HYSTERECTOMY     APPENDECTOMY     CARDIOVERSION N/A 04/24/2021   Procedure: CARDIOVERSION;  Surgeon: Jerline Pain, MD;  Location: [facility];  Service: Cardiovascular;  Laterality: N/A;   CARDIOVERSION N/A 06/12/2021   Procedure: CARDIOVERSION;  Surgeon: Buford Dresser, MD;  Location: [facility];  Service: Cardiovascular;  Laterality: N/A;   CARDIOVERSION N/A 08/11/2021   Procedure: CARDIOVERSION;  Surgeon: [name], MD;  Location: [facility];  Service: Cardiovascular;  Laterality: N/A;   CHOLECYSTECTOMY     ear drum surgery     REPLACEMENT TOTAL KNEE     ROTATOR CUFF REPAIR Right    TONSILLECTOMY      Current Medications: Current Outpatient Medications on File Prior to Visit  Medication Sig   acetaminophen (TYLENOL) 500 MG tablet Take 1 tablet (500 mg total) by mouth every 6 (six) hours as needed for pain. (Patient taking differently: Take 1,000 mg by mouth every 6 (six) hours as needed for pain or moderate pain.)  amiodarone (PACERONE) 200 MG tablet Take 1 tablet (200 mg total) by mouth daily.   B Complex Vitamins (VITAMIN B COMPLEX) TABS Take 1 tablet by mouth daily.   carvedilol (COREG) 12.5 MG tablet Take 1 tablet (12.5 mg total) by mouth 2 (two) times daily. Replaces metoprolol   cefdinir (OMNICEF) 300 MG capsule Take 1 capsule (300 mg total) by mouth daily for 5 days.   clidinium-chlordiazePOXIDE (LIBRAX) 5-2.5 MG capsule Take 2 capsules by mouth daily as needed  (cramping).   CRANBERRY-VITAMIN C PO Take 1 capsule by mouth in the morning.   diltiazem (CARDIZEM CD) 360 MG 24 hr capsule Take 1 capsule (360 mg total) by mouth daily.   esomeprazole (NEXIUM) 40 MG capsule Take 40 mg by mouth daily before breakfast.   Hyoscyamine Sulfate SL 0.125 MG SUBL Place 0.125 mg under the tongue every 4 (four) hours as needed (cramping).   isosorbide dinitrate (ISORDIL) 10 MG tablet Take 10 mg by mouth 2 (two) times daily.   levothyroxine (SYNTHROID) 100 MCG tablet Take 100 mcg by mouth daily before breakfast.   Netarsudil-Latanoprost (ROCKLATAN) 0.02-0.005 % SOLN Place 1 drop into both eyes at bedtime.   polyvinyl alcohol (ARTIFICIAL TEARS) 1.4 % ophthalmic solution Place 1 drop into both eyes as needed for dry eyes.   senna-docusate (SENOKOT-S) 8.6-50 MG tablet Take 2 tablets by mouth 2 (two) times daily.   Vitamin D, Ergocalciferol, (DRISDOL) 1.25 MG (50000 UNIT) CAPS capsule Take 50,000 Units by mouth every 7 (seven) days.   No current facility-administered medications on file prior to visit.     Allergies:   Ativan [lorazepam], Augmentin [amoxicillin-pot clavulanate], Avelox [moxifloxacin], Brimonidine, Chlorzoxazone, Ciprofloxacin hcl, Clavulanic acid, Crestor [rosuvastatin], Ezetimibe, Gluten meal, Nitrofuran derivatives, Phenergan [promethazine hcl], Wheat bran, Codeine, Darvon [propoxyphene hcl], and Doxycycline   Social History   Tobacco Use   Smoking status: Former    Packs/day: 2.00    Years: 33.00    Total pack years: 66.00    Types: Cigarettes    Quit date: 1981    Years since quitting: 43.2   Smokeless tobacco: Never  Substance Use Topics   Alcohol use: Not Currently    Comment: rare   Drug use: No    Family History: family history includes Hypertension in an other family member.  ROS:   Please see the history of present illness. All other systems are reviewed and negative.    EKGs/Labs/Other Studies Reviewed:    The following  studies were reviewed today: Monitor 05/2022 Patch Wear Time:  7 days and 3 hours (2024-01-03T16:15:35-0500 to 2024-01-10T19:36:55-0500)  Patient had a min HR of 49 bpm, max HR of 95 bpm, and avg HR of 60 bpm. Predominant underlying rhythm was Sinus Rhythm. 1 run of Supraventricular Tachycardia occurred lasting 6 beats with a max rate of 95 bpm (avg 88 bpm). No VT, atrial fibrillation, high degree block, or pauses noted. Isolated atrial and ventricular ectopy was rare (<1%). There were 5 triggered events, which were largely sinus (a few with PVC). No significant arrhythmias detected.   Abdominal US 02/17/22: Liver:  No focal lesion identified. Within normal limits in parenchymal  echogenicity. Portal vein is patent on color Doppler imaging with  normal direction of blood flow towards the liver.  IMPRESSION:  1. The common bile duct measures 9 mm which is normal for the  patient's age and status post cholecystectomy.  2. No other abnormalities.   Echo 12/24/2021: 1. Left ventricular ejection fraction, by estimation, is 40  to 45%. The  left ventricle has mildly decreased function. The left ventricle  demonstrates global hypokinesis. There is mild concentric left ventricular  hypertrophy. Left ventricular diastolic  parameters are indeterminate.   2. Right ventricular systolic function is normal. The right ventricular  size is normal.   3. Left atrial size was moderately dilated.   4. The mitral valve is normal in structure. No evidence of mitral valve  regurgitation. No evidence of mitral stenosis.   5. The Aortic valve is thickened and moderately calcified. . There is  mild calcification of the aortic valve. There is mild thickening of the  aortic valve. Aortic valve regurgitation is moderate. Suspect low flow low  gradient moderate aortic stenosis.  Aortic valve area, by VTI measures 1.09 cm. Aortic valve mean gradient  measures 11.0 mmHg. Aortic valve Vmax measures 2.02 m/s.   6. The  inferior vena cava is normal in size with greater than 50%  respiratory variability, suggesting right atrial pressure of 3 mmHg.   Comparison(s): No significant change from prior study.    CT Chest 05/18/2021 ([place]): COMPARISON:  04/02/2021   FINDINGS:  Cardiovascular: Heart top-normal in size. Three-vessel coronary  artery calcifications. No pericardial effusion. Aortic  atherosclerosis.   Mediastinum/Nodes: No neck base, mediastinal or hilar masses or  enlarged lymph nodes. Trachea and esophagus are unremarkable.   Lungs/Pleura: No evidence of pulmonary contusion or laceration. No  no lung consolidation to suggest pneumonia and no evidence of  pulmonary edema. Several reticulonodular type opacities, most  evident in the anterior right middle lobe, are stable, benign. There  are no masses or suspicious nodules. Mild paraseptal emphysema is  noted posteriorly, also unchanged.   No pleural effusion or pneumothorax.   Upper Abdomen: No acute abnormality.   Musculoskeletal: No fracture or acute finding. Bilateral  well-positioned shoulder prostheses. No bone lesions. No chest wall  mass or contusion.   IMPRESSION:  1. No acute findings or evidence of injury to the chest. No  fractures.  2. Aortic atherosclerosis and coronary artery calcifications.   Aortic Atherosclerosis (ICD10-I70.0) and Emphysema (ICD10-J43.9).   CT Chest 04/02/2021 ([place]): COMPARISON:  Chest x-rays from November of 2022.   FINDINGS:  Cardiovascular: Calcified atherosclerosis of the thoracic aorta.  Calcified coronary artery disease, three-vessel coronary artery  disease. No aortic dilation. Heart size moderately enlarged, no  substantial pericardial effusion. Central pulmonary vasculature is  normal caliber. Limited assessment of cardiovascular structures  given lack of intravenous contrast.   Mediastinum/Nodes: Patulous esophagus. No thoracic inlet or axillary   adenopathy though streak artifact from bilateral reverse shoulder  arthroplasties limits assessment in the axillary regions and at the  thoracic inlet. No mediastinal or hilar adenopathy.   Lungs/Pleura: Basilar atelectasis at the LEFT lung base. No  effusion. No consolidation. 3 mm RIGHT middle lobe pulmonary nodule  (image 101/3). Airways are patent.   Upper Abdomen: Postoperative changes about the GE junction with  surgical clips in this area. No acute findings relative to  visualized liver, pancreas, spleen or very limited visualization of  other upper abdominal structures.   Musculoskeletal: Post bilateral reverse shoulder arthroplasty. No  acute musculoskeletal process. No destructive bone finding. Spinal  degenerative changes.   IMPRESSION:  No acute findings in the chest.   3 mm RIGHT middle lobe pulmonary nodule. No follow-up needed if  patient is low-risk. Non-contrast chest CT can be considered in 12  months if patient is high-risk. This recommendation follows  the  consensus statement: Guidelines for Management of Incidental  Pulmonary Nodules Detected on CT Images: From the Fleischner Society  2017; Radiology 2017; 284:228-243.   Three-vessel coronary artery disease.   Postoperative changes about the GE junction.   Aortic atherosclerosis.   Echo 03/16/21 1. Left ventricular ejection fraction, by estimation, is 40 to 45%. The  left ventricle has mildly decreased function. The left ventricle  demonstrates global hypokinesis. Left ventricular diastolic parameters are  indeterminate.   2. Right ventricular systolic function is mildly reduced. The right  ventricular size is normal. There is moderately elevated pulmonary artery  systolic pressure. The estimated right ventricular systolic pressure is  [id number] mmHg.   3. Left atrial size was moderately dilated.   4. Right atrial size was severely dilated.   5. The mitral valve is normal in structure. Mild to moderate  mitral valve  regurgitation.   6. Tricuspid valve regurgitation is moderate.   7. The aortic valve is tricuspid. There is severe calcifcation of the  aortic valve. Aortic valve regurgitation is mild. Moderate aortic valve  stenosis. Gradients consistent with mild AS (MG 34mHg, Vmax 2.2 m/s), but moderate by AVA (1.0cm^2) and DI  (0.32). Low SV index, suspect low flow low gradient moderate AS   8. The inferior vena cava is dilated in size with <50% respiratory  variability, suggesting right atrial pressure of 15 mmHg.   EKG:  EKG is personally reviewed.   06/23/22: SR at 69 bpm with single PVC 02/20/22: sinus bradycardia at 56 bpm 01/05/22: Sinus bradycardia. Rate 56 bpm. 05/30/2021: atrial flutter at 77 bpm 05/09/2021: atrial fibrillation at 83 bpm 04/08/21: atrial fibrillation at 86 bpm  Recent Labs: 03/18/2022: B Natriuretic Peptide 187.8; Magnesium 2.2; TSH 10.620 06/22/2022: ALT 26; BUN 29; Creatinine, Ser 1.34; Hemoglobin 11.8; Platelets 200; Potassium 4.1; Sodium 130   Recent Lipid Panel No results found for: "CHOL", "TRIG", "HDL", "CHOLHDL", "VLDL", "LDLCALC", "LDLDIRECT"  Physical Exam:    VS:  BP (!) 166/82 (BP Location: Left Arm, Patient Position: Sitting, Cuff Size: Normal)   Pulse 69   Ht 5' (1.524 m)   Wt 145 lb 4.8 oz (65.9 kg)   BMI 28.38 kg/m     Wt Readings from Last 3 Encounters:  06/23/22 145 lb 4.8 oz (65.9 kg)  06/22/22 149 lb 7.6 oz (67.8 kg)  06/14/22 149 lb 8 oz (67.8 kg)    GEN: Well nourished, well developed in no acute distress HEENT: Normal, moist mucous membranes NECK: No JVD CARDIAC: regular rhythm, normal S1 and S2, no rubs or gallops. 2/6 systolic murmur. VASCULAR: Radial and DP pulses 2+ bilaterally. No carotid bruits RESPIRATORY:  Clear to auscultation without rales, wheezing or rhonchi  ABDOMEN: Soft, non-tender, non-distended MUSCULOSKELETAL:  Ambulates independently SKIN: Warm and dry, no edema NEUROLOGIC:  Alert and oriented x 3. No focal  neuro deficits noted. PSYCHIATRIC:  Normal affect     ASSESSMENT:    1. PAF ([place]oxysmal atrial fibrillation) ([place]   2. Primary hypertension   3. Secondary hypercoagulable state ([place]   4. NICM (nonischemic cardiomyopathy) ([place]   5. Long term current use of anticoagulant   6. Stage 3b chronic kidney disease (HCC)     PLAN:    Chronic kidney disease, stage 3b vs stage 4 Hypertension -ARB held during prior hospitalization -on hydralazine 75 mg tid, increasing to 100 mg TID today -changed metoprolol to carvedilol at last visit, tolerating well -imdur added by nephrology, continue -followed by nephrology -next steps would  be to change diltiazem to amlodipine, follow HR -clonidine would be last step  Paroxysmal atrial fibrillation/atrial flutter Chronic systolic and diastolic heart failure Nonischemic cardiomyopathy -EF 40-45%. Not significantly changed with holding sinus rhythm -Cath with nonobstructive CAD -tolerating carvedilol -would prefer not to use diltiazem given low EF, but was not rate controlled on beta blocker alone. Will continue diltiazem for now, consider changing to amlodipine and increasing carvedilol dose in the future if needed -CHA2DS2/VAS Stroke Risk Points=8  -continue apixaban 5 mg BID -tolerating amiodarone  OSA: continue CPAP  Moderate aortic stenosis Mild-moderate MR -last echo 12/2021, follow annually or for change in symptoms  Bilateral LE edema, R >L: none today -has history of vein stripping as well as bilateral knee arthroplasty, predisposing her to venous insufficiency -previously compression stockings, elevation  CAD, nonobstructive, without angina -given age, continue apixaban and no aspirin -continue atorvastatin  Plan for follow up: 4 weeks.   Buford Dresser, MD, PhD, [facility] at [facility] at [facility] [address] [place],  [place] [address] [phone]   Medication Adjustments/Labs and Tests Ordered: Current medicines are reviewed at length with the patient today.  Concerns regarding medicines are outlined above.   Orders Placed This Encounter  Procedures   EKG 12-Lead   Meds ordered this encounter  Medications   apixaban (ELIQUIS) 5 MG TABS tablet    Sig: Take 1 tablet (5 mg total) by mouth 2 (two) times daily.    Dispense:  180 tablet    Refill:  3   hydrALAZINE (APRESOLINE) 50 MG tablet    Sig: Take 2 tablets (100 mg total) by mouth 3 (three) times daily.    Dispense:  270 tablet    Refill:  3   Patient Instructions  Medication Instructions:   I would continue the carvedilol 12.5 mg twice a day for now. I would increase hydralazine from the 75 mg (one and a half pills) to 100 mg (two pills) three times a day for now   The changes we made at the last visit helped, but we aren't where we want to be yet. I would continue the carvedilol 12.5 mg twice a day for now.  We will see how this treats your blood pressure and check in after another few weeks to see how we are doing.  *If you need a refill on your cardiac medications before your next appointment, please call your pharmacy*   Lab Work: None   Testing/Procedures: None   Follow-Up: At [facility], you and your health needs are our priority.  As part of our continuing mission to provide you with exceptional heart care, we have created designated Provider Care Teams.  These Care Teams include your primary Cardiologist (physician) and Advanced Practice Providers (APPs -  Physician Assistants and Nurse Practitioners) who all work together to provide you with the care you need, when you need it.  We recommend signing up for the patient portal called "MyChart".  Sign up information is provided on this After Visit Summary.  MyChart is used to connect with patients for Virtual Visits (Telemedicine).  Patients are able to view lab/test results,  encounter notes, upcoming appointments, etc.  Non-urgent messages can be sent to your provider as well.   To learn more about what you can do with MyChart, go to [email or web address].    Your next appointment:   4 week(s)  Provider:   Shawna Orleans  Harrell Gave, MD    Other Instructions Since you haven't had afib recently, next step would be to change the diltiazem to amlodipine. This may make your heart rate a bit faster, which means we may have to change the carvedilol as well.  Signed, Buford Dresser, MD PhD 06/23/2022    [place]

## 2022-06-23 NOTE — Patient Instructions (Signed)
Medication Instructions:   I would continue the carvedilol 12.5 mg twice a day for now. I would increase hydralazine from the 75 mg (one and a half pills) to 100 mg (two pills) three times a day for now   The changes we made at the last visit helped, but we aren't where we want to be yet. I would continue the carvedilol 12.5 mg twice a day for now.  We will see how this treats your blood pressure and check in after another few weeks to see how we are doing.  *If you need a refill on your cardiac medications before your next appointment, please call your pharmacy*   Lab Work: None   Testing/Procedures: None   Follow-Up: At [facility], you and your health needs are our priority.  As part of our continuing mission to provide you with exceptional heart care, we have created designated Provider Care Teams.  These Care Teams include your primary Cardiologist (physician) and Advanced Practice Providers (APPs -  Physician Assistants and Nurse Practitioners) who all work together to provide you with the care you need, when you need it.  We recommend signing up for the patient portal called "MyChart".  Sign up information is provided on this After Visit Summary.  MyChart is used to connect with patients for Virtual Visits (Telemedicine).  Patients are able to view lab/test results, encounter notes, upcoming appointments, etc.  Non-urgent messages can be sent to your provider as well.   To learn more about what you can do with MyChart, go to [email or web address].    Your next appointment:   4 week(s)  Provider:   Buford Dresser, MD    Other Instructions Since you haven't had afib recently, next step would be to change the diltiazem to amlodipine. This may make your heart rate a bit faster, which means we may have to change the carvedilol as well.

## 2022-06-24 ENCOUNTER — Telehealth: Payer: Self-pay | Admitting: Cardiology

## 2022-06-24 LAB — URINE CULTURE: Culture: >=100000 — AB

## 2022-06-24 NOTE — Telephone Encounter (Signed)
Returned call to [place] with [facility], message left with verbal order to continue home health PT for 1x weekly for 5 weeks. Georgana Curio MHA RN CCM

## 2022-06-24 NOTE — Progress Notes (Signed)
Cardiology Office Note:    Date:  06/24/2022   ID:  Amy Hickman, DOB [birth year], MRN [id number]  PCP:  [facility]  Cardiologist:  Buford Dresser, MD  Referring MD: [facility]*   CC: follow up  History of Present Illness:    Amy Hickman is a 87 y.o. female with a hx of persistent atrial fibrillation, hypertension, OSA on CPAP, CAD, CKD, GERD, COPD, and celiac disease who is seen for follow up today. I met her during her admission 03/17/21 for the evaluation and management of atrial fibrillation.  History: Admission 03/15/21 for sepsis in the setting of influenza A, found to have afib RVR. EF slightly reduced. Difficult to rate control, required both metoprolol and diltiazem. Cardioversion 04/24/2021 got her into sinus rhythm, but she then returned to atrial fibrillation. Started on amiodarone, repeat DCCV 06/12/21 with restoration of sinus rhythm.   She presented to the ED on 12/22/2021 for left chest pain. She was experiencing chest pain when attempting to breathe hard. This pain has become worse in the last week. She took 6 NTG with transient improvement before she got hot and nauseated. Troponin was negatve x2. Chest X-ray negative. Admission to the hospital was recommended by cardiology for further work up.   At her last visit with me on 01/05/22, she was doing well overall. She was currently being treated for a UTI. She had not been checking her BP at home and had elevated blood pressures in clinic that day. Her Hydralazine '50mg'$  was increased to TID use at that time.  Today: BP remains elevated. Continues to feel fatigued. Discussed options for management, see below  Denies chest pain, shortness of breath at rest or with normal exertion. No PND, orthopnea, LE edema or unexpected weight gain. No syncope or palpitations.   Past Medical History:  Diagnosis Date   Atrial fibrillation, chronic (HCC)    s/p multiple cardioversions  with sustained NSR, on Eliquis   Celiac disease    Chronic systolic CHF (congestive heart failure) (HCC)    Coronary artery disease involving native coronary artery of native heart without angina pectoris 03/15/2021   GERD (gastroesophageal reflux disease)    HOH (hard of hearing)    Hyperparathyroidism ([place]) 12/12/2020   Hypertension    Interstitial cystitis     Past Surgical History:  Procedure Laterality Date   ABDOMINAL HYSTERECTOMY     APPENDECTOMY     CARDIOVERSION N/A 04/24/2021   Procedure: CARDIOVERSION;  Surgeon: Jerline Pain, MD;  Location: [place] [facility];  Service: Cardiovascular;  Laterality: N/A;   CARDIOVERSION N/A 06/12/2021   Procedure: CARDIOVERSION;  Surgeon: Buford Dresser, MD;  Location: [place];  Service: Cardiovascular;  Laterality: N/A;   CARDIOVERSION N/A 08/11/2021   Procedure: CARDIOVERSION;  Surgeon: [name], MD;  Location: [facility];  Service: Cardiovascular;  Laterality: N/A;   CHOLECYSTECTOMY     ear drum surgery     REPLACEMENT TOTAL KNEE     ROTATOR CUFF REPAIR Right    TONSILLECTOMY      Current Medications: Current Outpatient Medications on File Prior to Visit  Medication Sig   acetaminophen (TYLENOL) 500 MG tablet Take 1 tablet (500 mg total) by mouth every 6 (six) hours as needed for pain. (Patient taking differently: Take 1,000 mg by mouth every 6 (six) hours as needed for pain or moderate pain.)   amiodarone (PACERONE) 200 MG tablet Take 1 tablet (200 mg total) by mouth daily.  B Complex Vitamins (VITAMIN B COMPLEX) TABS Take 1 tablet by mouth daily.   clidinium-chlordiazePOXIDE (LIBRAX) 5-2.5 MG capsule Take 2 capsules by mouth daily as needed (cramping).   CRANBERRY-VITAMIN C PO Take 1 capsule by mouth in the morning.   diltiazem (CARDIZEM CD) 360 MG 24 hr capsule Take 1 capsule (360 mg total) by mouth daily.   esomeprazole (NEXIUM) 40 MG capsule Take 40 mg by mouth daily before breakfast.   Hyoscyamine Sulfate  SL 0.125 MG SUBL Place 0.125 mg under the tongue every 4 (four) hours as needed (cramping).   isosorbide dinitrate (ISORDIL) 10 MG tablet Take 10 mg by mouth 2 (two) times daily.   levothyroxine (SYNTHROID) 100 MCG tablet Take 100 mcg by mouth daily before breakfast.   Netarsudil-Latanoprost (ROCKLATAN) 0.02-0.005 % SOLN Place 1 drop into both eyes at bedtime.   polyvinyl alcohol (ARTIFICIAL TEARS) 1.4 % ophthalmic solution Place 1 drop into both eyes as needed for dry eyes.   senna-docusate (SENOKOT-S) 8.6-50 MG tablet Take 2 tablets by mouth 2 (two) times daily.   Vitamin D, Ergocalciferol, (DRISDOL) 1.25 MG (50000 UNIT) CAPS capsule Take 50,000 Units by mouth every 7 (seven) days.   No current facility-administered medications on file prior to visit.     Allergies:   Ativan [lorazepam], Augmentin [amoxicillin-pot clavulanate], Avelox [moxifloxacin], Brimonidine, Chlorzoxazone, Ciprofloxacin hcl, Clavulanic acid, Crestor [rosuvastatin], Ezetimibe, Gluten meal, Nitrofuran derivatives, Phenergan [promethazine hcl], Wheat bran, Codeine, Darvon [propoxyphene hcl], and Doxycycline   Social History   Tobacco Use   Smoking status: Former    Packs/day: 2.00    Years: 33.00    Total pack years: 66.00    Types: Cigarettes    Quit date: 1981    Years since quitting: 43.2   Smokeless tobacco: Never  Substance Use Topics   Alcohol use: Not Currently    Comment: rare   Drug use: No    Family History: family history includes Hypertension in an other family member.  ROS:   Please see the history of present illness. All other systems are reviewed and negative.    EKGs/Labs/Other Studies Reviewed:    The following studies were reviewed today:  Abdominal US 02/17/22: Liver:  No focal lesion identified. Within normal limits in parenchymal  echogenicity. Portal vein is patent on color Doppler imaging with  normal direction of blood flow towards the liver.  IMPRESSION:  1. The common bile  duct measures 9 mm which is normal for the  patient's age and status post cholecystectomy.  2. No other abnormalities.   Echo 12/24/2021: 1. Left ventricular ejection fraction, by estimation, is 40 to 45%. The  left ventricle has mildly decreased function. The left ventricle  demonstrates global hypokinesis. There is mild concentric left ventricular  hypertrophy. Left ventricular diastolic  parameters are indeterminate.   2. Right ventricular systolic function is normal. The right ventricular  size is normal.   3. Left atrial size was moderately dilated.   4. The mitral valve is normal in structure. No evidence of mitral valve  regurgitation. No evidence of mitral stenosis.   5. The Aortic valve is thickened and moderately calcified. . There is  mild calcification of the aortic valve. There is mild thickening of the  aortic valve. Aortic valve regurgitation is moderate. Suspect low flow low  gradient moderate aortic stenosis.  Aortic valve area, by VTI measures 1.09 cm. Aortic valve mean gradient  measures 11.0 mmHg. Aortic valve Vmax measures 2.02 m/s.   6. The inferior  vena cava is normal in size with greater than 50%  respiratory variability, suggesting right atrial pressure of 3 mmHg.   Comparison(s): No significant change from prior study.    CT Chest 05/18/2021 ([place]): COMPARISON:  04/02/2021   FINDINGS:  Cardiovascular: Heart top-normal in size. Three-vessel coronary  artery calcifications. No pericardial effusion. Aortic  atherosclerosis.   Mediastinum/Nodes: No neck base, mediastinal or hilar masses or  enlarged lymph nodes. Trachea and esophagus are unremarkable.   Lungs/Pleura: No evidence of pulmonary contusion or laceration. No  no lung consolidation to suggest pneumonia and no evidence of  pulmonary edema. Several reticulonodular type opacities, most  evident in the anterior right middle lobe, are stable, benign. There  are no masses or  suspicious nodules. Mild paraseptal emphysema is  noted posteriorly, also unchanged.   No pleural effusion or pneumothorax.   Upper Abdomen: No acute abnormality.   Musculoskeletal: No fracture or acute finding. Bilateral  well-positioned shoulder prostheses. No bone lesions. No chest wall  mass or contusion.   IMPRESSION:  1. No acute findings or evidence of injury to the chest. No  fractures.  2. Aortic atherosclerosis and coronary artery calcifications.   Aortic Atherosclerosis (ICD10-I70.0) and Emphysema (ICD10-J43.9).   CT Chest 04/02/2021 ([place]): COMPARISON:  Chest x-rays from November of 2022.   FINDINGS:  Cardiovascular: Calcified atherosclerosis of the thoracic aorta.  Calcified coronary artery disease, three-vessel coronary artery  disease. No aortic dilation. Heart size moderately enlarged, no  substantial pericardial effusion. Central pulmonary vasculature is  normal caliber. Limited assessment of cardiovascular structures  given lack of intravenous contrast.   Mediastinum/Nodes: Patulous esophagus. No thoracic inlet or axillary  adenopathy though streak artifact from bilateral reverse shoulder  arthroplasties limits assessment in the axillary regions and at the  thoracic inlet. No mediastinal or hilar adenopathy.   Lungs/Pleura: Basilar atelectasis at the LEFT lung base. No  effusion. No consolidation. 3 mm RIGHT middle lobe pulmonary nodule  (image 101/3). Airways are patent.   Upper Abdomen: Postoperative changes about the GE junction with  surgical clips in this area. No acute findings relative to  visualized liver, pancreas, spleen or very limited visualization of  other upper abdominal structures.   Musculoskeletal: Post bilateral reverse shoulder arthroplasty. No  acute musculoskeletal process. No destructive bone finding. Spinal  degenerative changes.   IMPRESSION:  No acute findings in the chest.   3 mm RIGHT middle lobe  pulmonary nodule. No follow-up needed if  patient is low-risk. Non-contrast chest CT can be considered in 12  months if patient is high-risk. This recommendation follows the  consensus statement: Guidelines for Management of Incidental  Pulmonary Nodules Detected on CT Images: From the Fleischner Society  2017; Radiology 2017; 284:228-243.   Three-vessel coronary artery disease.   Postoperative changes about the GE junction.   Aortic atherosclerosis.   Echo 03/16/21 1. Left ventricular ejection fraction, by estimation, is 40 to 45%. The  left ventricle has mildly decreased function. The left ventricle  demonstrates global hypokinesis. Left ventricular diastolic parameters are  indeterminate.   2. Right ventricular systolic function is mildly reduced. The right  ventricular size is normal. There is moderately elevated pulmonary artery  systolic pressure. The estimated right ventricular systolic pressure is  [id number] mmHg.   3. Left atrial size was moderately dilated.   4. Right atrial size was severely dilated.   5. The mitral valve is normal in structure. Mild to moderate mitral valve  regurgitation.  6. Tricuspid valve regurgitation is moderate.   7. The aortic valve is tricuspid. There is severe calcifcation of the  aortic valve. Aortic valve regurgitation is mild. Moderate aortic valve  stenosis. Gradients consistent with mild AS (MG 30mHg, Vmax 2.2 m/s), but moderate by AVA (1.0cm^2) and DI  (0.32). Low SV index, suspect low flow low gradient moderate AS   8. The inferior vena cava is dilated in size with <50% respiratory  variability, suggesting right atrial pressure of 15 mmHg.   EKG:  EKG is personally reviewed.   05/26/22: not ordered today 02/20/22: sinus bradycardia at 56 bpm 01/05/22: Sinus bradycardia. Rate 56 bpm. 05/30/2021: atrial flutter at 77 bpm 05/09/2021: atrial fibrillation at 83 bpm 04/08/21: atrial fibrillation at 86 bpm  Recent Labs: 03/18/2022: B  Natriuretic Peptide 187.8; Magnesium 2.2; TSH 10.620 06/22/2022: ALT 26; BUN 29; Creatinine, Ser 1.34; Hemoglobin 11.8; Platelets 200; Potassium 4.1; Sodium 130   Recent Lipid Panel No results found for: "CHOL", "TRIG", "HDL", "CHOLHDL", "VLDL", "LDLCALC", "LDLDIRECT"  Physical Exam:    VS:  BP (!) 172/90 (BP Location: Right Arm, Patient Position: Sitting, Cuff Size: Normal)   Pulse (!) 58   Ht 5' (1.524 m)   Wt 149 lb 8 oz (67.8 kg)   SpO2 97%   BMI 29.20 kg/m     Wt Readings from Last 3 Encounters:  06/23/22 145 lb 4.8 oz (65.9 kg)  06/22/22 149 lb 7.6 oz (67.8 kg)  06/14/22 149 lb 8 oz (67.8 kg)    GEN: Well nourished, well developed in no acute distress HEENT: Normal, moist mucous membranes NECK: No JVD CARDIAC: regular rhythm, normal S1 and S2, no rubs or gallops. 2/6 systolic murmur.  VASCULAR: Radial and DP pulses 2+ bilaterally. No carotid bruits RESPIRATORY:  Clear to auscultation without rales, wheezing or rhonchi. ABDOMEN: Soft, non-tender, non-distended MUSCULOSKELETAL:  Ambulates independently SKIN: Warm and dry, trivial LE edema NEUROLOGIC:  Alert and oriented x 3. No focal neuro deficits noted. PSYCHIATRIC:  Normal affect    ASSESSMENT:    1. Primary hypertension   2. PAF (paroxysmal atrial fibrillation) ([place]   3. Secondary hypercoagulable state ([place]   4. Long term current use of anticoagulant   5. Chronic combined systolic and diastolic heart failure ([place]   6. NICM (nonischemic cardiomyopathy) (HCC)    PLAN:    Paroxysmal atrial fibrillation/atrial flutter Chronic systolic and diastolic heart failure Nonischemic cardiomyopathy -EF 40-45%. Not significantly changed with holding sinus rhythm -Cath with nonobstructive CAD -changing metoprolol to carvedilol -would prefer not to use diltiazem given low EF, but was not rate controlled on metoprolol alone. Will continue diltiazem for now.  -CHA2DS2/VAS Stroke Risk Points=8  -continue apixaban 5 mg  BID -tolerating amiodarone  Weakness, fatigue: will order home PT  Chronic kidney disease, stage 3b vs stage 4 Hypertension -ARB held during recent hospitalization -on hydralazine 50 mg TID. BP elevated today but she is feeling poorly. If remains elevated once she feels improved, could increase to 75 mg TID -imdur added by nephrology, continue -followed by nephrology -change metoprolol to carvedilol  OSA: continue CPAP  Moderate aortic stenosis Mild-moderate MR -last echo 12/2021, follow annually or for change in symptoms  Bilateral LE edema, R >L: only trivial today -has history of vein stripping as well as bilateral knee arthroplasty, predisposing her to venous insufficiency -she is otherwise euvolemic on exam today -previously discussed venous insufficiency, compression stockings, elevation  CAD, nonobstructive, without angina -given age, continue apixaban and no aspirin -continue  atorvastatin  Plan for follow up: 3 months or sooner if needed   Medication Adjustments/Labs and Tests Ordered: Current medicines are reviewed at length with the patient today.  Concerns regarding medicines are outlined above.   Orders Placed This Encounter  Procedures   Ambulatory referral to [facility] ordered this encounter  Medications   DISCONTD: carvedilol (COREG) 12.5 MG tablet    Sig: Take 1 tablet (12.5 mg total) by mouth 2 (two) times daily. Replaces metoprolol    Dispense:  180 tablet    Refill:  3   Patient Instructions  Medication Instructions:  STOP METOPROLOL   START CARVEDILOL 12.5 MG TWICE A DAY   *If you need a refill on your cardiac medications before your next appointment, please call your pharmacy*  Lab Work: NONE  Testing/Procedures: NONE   Follow-Up: At [facility], you and your health needs are our priority.  As part of our continuing mission to provide you with exceptional heart care, we have created designated Provider Care Teams.   These Care Teams include your primary Cardiologist (physician) and Advanced Practice Providers (APPs -  Physician Assistants and Nurse Practitioners) who all work together to provide you with the care you need, when you need it.  We recommend signing up for the patient portal called "MyChart".  Sign up information is provided on this After Visit Summary.  MyChart is used to connect with patients for Virtual Visits (Telemedicine).  Patients are able to view lab/test results, encounter notes, upcoming appointments, etc.  Non-urgent messages can be sent to your provider as well.   To learn more about what you can do with MyChart, go to [email or web address].    Your next appointment:   4 week(s)  Provider:   Buford Dresser, MD   Other Instructions MONITOR YOUR BLOOD PRESSURE DAILY AND LOG. BRING LOG AND BLOOD PRESSURE MACHINE TO YOUR FOLLOW UP   Signed, Buford Dresser, MD PhD 06/24/2022    [facility]

## 2022-06-24 NOTE — Telephone Encounter (Signed)
Amy Hickman with [facility] call to get verbal orders to extend home physical therapy and frequency for 1 x weekly for 5 weeks.

## 2022-06-25 ENCOUNTER — Telehealth (HOSPITAL_BASED_OUTPATIENT_CLINIC_OR_DEPARTMENT_OTHER): Payer: Self-pay | Admitting: *Deleted

## 2022-06-25 NOTE — Telephone Encounter (Signed)
Post ED Visit - Positive Culture Follow-up  Culture report reviewed by antimicrobial stewardship pharmacist: [place] Team '[]'$  Elenor Quinones, Pharm.D. '[]'$  Heide Guile, Pharm.D., BCPS AQ-ID '[]'$  Parks Neptune, Pharm.D., BCPS '[]'$  Alycia Rossetti, Pharm.D., BCPS '[]'$  [place], [place].D., BCPS, AAHIVP '[]'$  Legrand Como, Pharm.D., BCPS, AAHIVP '[]'$  Salome Arnt, PharmD, BCPS '[]'$  Johnnette Gourd, PharmD, BCPS '[]'$  Hughes Better, PharmD, BCPS '[]'$  Leeroy Cha, PharmD '[]'$  Laqueta Linden, PharmD, BCPS '[]'$  Albertina Parr, PharmD  [place] Team '[]'$  Leodis Sias, PharmD '[]'$  Lindell Spar, PharmD '[]'$  Royetta Asal, PharmD '[]'$  Graylin Shiver, Rph '[]'$  Rema Fendt) Glennon Mac, PharmD '[]'$  Arlyn Dunning, PharmD '[]'$  Netta Cedars, PharmD '[]'$  Dia Sitter, PharmD '[]'$  Leone Haven, PharmD '[]'$  Gretta Arab, PharmD '[]'$  Theodis Shove, PharmD '[]'$  Peggyann Juba, PharmD '[]'$  Reuel Boom, PharmD   Positive urine culture Treated with Cefdinir, organism sensitive to the same and no further patient follow-up is required at this time.  [name], PharmD  [name] 06/25/2022, 12:33 PM

## 2022-07-06 ENCOUNTER — Other Ambulatory Visit (HOSPITAL_COMMUNITY): Payer: Self-pay | Admitting: Nurse Practitioner

## 2022-07-12 ENCOUNTER — Encounter (HOSPITAL_BASED_OUTPATIENT_CLINIC_OR_DEPARTMENT_OTHER): Payer: Self-pay | Admitting: Urology

## 2022-07-12 ENCOUNTER — Emergency Department (HOSPITAL_BASED_OUTPATIENT_CLINIC_OR_DEPARTMENT_OTHER): Payer: Medicare Other

## 2022-07-12 ENCOUNTER — Observation Stay (HOSPITAL_BASED_OUTPATIENT_CLINIC_OR_DEPARTMENT_OTHER)
Admission: EM | Admit: 2022-07-12 | Discharge: 2022-07-13 | Disposition: A | Discharge status: Home / Self Care | Payer: Medicare Other | Attending: Family Medicine | Admitting: Family Medicine

## 2022-07-12 ENCOUNTER — Other Ambulatory Visit: Payer: Self-pay

## 2022-07-12 DIAGNOSIS — Z79899 Other long term (current) drug therapy: Secondary | ICD-10-CM | POA: Diagnosis not present

## 2022-07-12 DIAGNOSIS — J449 Chronic obstructive pulmonary disease, unspecified: Secondary | ICD-10-CM | POA: Diagnosis not present

## 2022-07-12 DIAGNOSIS — D6869 Other thrombophilia: Secondary | ICD-10-CM | POA: Diagnosis present

## 2022-07-12 DIAGNOSIS — Z87891 Personal history of nicotine dependence: Secondary | ICD-10-CM | POA: Insufficient documentation

## 2022-07-12 DIAGNOSIS — I5022 Chronic systolic (congestive) heart failure: Secondary | ICD-10-CM | POA: Insufficient documentation

## 2022-07-12 DIAGNOSIS — N183 Chronic kidney disease, stage 3 unspecified: Secondary | ICD-10-CM | POA: Insufficient documentation

## 2022-07-12 DIAGNOSIS — Z96659 Presence of unspecified artificial knee joint: Secondary | ICD-10-CM | POA: Insufficient documentation

## 2022-07-12 DIAGNOSIS — I4891 Unspecified atrial fibrillation: Secondary | ICD-10-CM | POA: Insufficient documentation

## 2022-07-12 DIAGNOSIS — E039 Hypothyroidism, unspecified: Secondary | ICD-10-CM | POA: Diagnosis not present

## 2022-07-12 DIAGNOSIS — I130 Hypertensive heart and chronic kidney disease with heart failure and stage 1 through stage 4 chronic kidney disease, or unspecified chronic kidney disease: Secondary | ICD-10-CM | POA: Diagnosis not present

## 2022-07-12 DIAGNOSIS — E440 Moderate protein-calorie malnutrition: Secondary | ICD-10-CM | POA: Diagnosis present

## 2022-07-12 DIAGNOSIS — I2510 Atherosclerotic heart disease of native coronary artery without angina pectoris: Secondary | ICD-10-CM | POA: Insufficient documentation

## 2022-07-12 DIAGNOSIS — K567 Ileus, unspecified: Secondary | ICD-10-CM

## 2022-07-12 DIAGNOSIS — Z86718 Personal history of other venous thrombosis and embolism: Secondary | ICD-10-CM | POA: Diagnosis not present

## 2022-07-12 DIAGNOSIS — K5900 Constipation, unspecified: Principal | ICD-10-CM | POA: Diagnosis present

## 2022-07-12 DIAGNOSIS — I480 Paroxysmal atrial fibrillation: Secondary | ICD-10-CM | POA: Diagnosis present

## 2022-07-12 DIAGNOSIS — I10 Essential (primary) hypertension: Secondary | ICD-10-CM | POA: Diagnosis present

## 2022-07-12 DIAGNOSIS — Z7901 Long term (current) use of anticoagulants: Secondary | ICD-10-CM | POA: Diagnosis not present

## 2022-07-12 DIAGNOSIS — I82409 Acute embolism and thrombosis of unspecified deep veins of unspecified lower extremity: Secondary | ICD-10-CM | POA: Diagnosis present

## 2022-07-12 LAB — CBC WITH DIFFERENTIAL/PLATELET
Abs Immature Granulocytes: 0.02 10*3/uL (ref 0.00–0.07)
Basophils Absolute: 0 10*3/uL (ref 0.0–0.1)
Basophils Relative: 0 %
Eosinophils Absolute: 0 10*3/uL (ref 0.0–0.5)
Eosinophils Relative: 0 %
HCT: 39.2 % (ref 36.0–46.0)
Hemoglobin: 12.7 g/dL (ref 12.0–15.0)
Immature Granulocytes: 0 %
Lymphocytes Relative: 12 %
Lymphs Abs: 0.9 10*3/uL (ref 0.7–4.0)
MCH: 30.8 pg (ref 26.0–34.0)
MCHC: 32.4 g/dL (ref 30.0–36.0)
MCV: 94.9 fL (ref 80.0–100.0)
Monocytes Absolute: 0.7 10*3/uL (ref 0.1–1.0)
Monocytes Relative: 10 %
Neutro Abs: 5.5 10*3/uL (ref 1.7–7.7)
Neutrophils Relative %: 78 %
Platelets: 221 10*3/uL (ref 150–400)
RBC: 4.13 MIL/uL (ref 3.87–5.11)
RDW: 14.6 % (ref 11.5–15.5)
WBC: 7.2 10*3/uL (ref 4.0–10.5)
nRBC: 0 % (ref 0.0–0.2)

## 2022-07-12 LAB — COMPREHENSIVE METABOLIC PANEL
ALT: 30 U/L (ref 0–44)
AST: 35 U/L (ref 15–41)
Albumin: 3.8 g/dL (ref 3.5–5.0)
Alkaline Phosphatase: 51 U/L (ref 38–126)
Anion gap: 7 (ref 5–15)
BUN: 33 mg/dL — ABNORMAL HIGH (ref 8–23)
CO2: 26 mmol/L (ref 22–32)
Calcium: 9.3 mg/dL (ref 8.9–10.3)
Chloride: 100 mmol/L (ref 98–111)
Creatinine, Ser: 1.33 mg/dL — ABNORMAL HIGH (ref 0.44–1.00)
GFR, Estimated: 39 mL/min — ABNORMAL LOW (ref >60)
Glucose, Bld: 113 mg/dL — ABNORMAL HIGH (ref 70–99)
Potassium: 4.8 mmol/L (ref 3.5–5.1)
Sodium: 133 mmol/L — ABNORMAL LOW (ref 135–145)
Total Bilirubin: 0.7 mg/dL (ref 0.3–1.2)
Total Protein: 6.7 g/dL (ref 6.5–8.1)

## 2022-07-12 MED ORDER — SODIUM CHLORIDE 0.9 % IV BOLUS
1000.0000 mL | Freq: Once | INTRAVENOUS | Status: AC
  Administered 2022-07-12: 1000 mL via INTRAVENOUS

## 2022-07-12 MED ORDER — BISACODYL 10 MG RE SUPP
10.0000 mg | Freq: Once | RECTAL | Status: AC
  Administered 2022-07-12: 10 mg via RECTAL
  Filled 2022-07-12: qty 1

## 2022-07-12 MED ORDER — ONDANSETRON HCL 4 MG/2ML IJ SOLN
4.0000 mg | Freq: Once | INTRAMUSCULAR | Status: AC
  Administered 2022-07-12: 4 mg via INTRAVENOUS
  Filled 2022-07-12: qty 2

## 2022-07-12 MED ORDER — IOHEXOL 300 MG/ML  SOLN
80.0000 mL | Freq: Once | INTRAMUSCULAR | Status: AC | PRN
  Administered 2022-07-12: 80 mL via INTRAVENOUS

## 2022-07-12 NOTE — ED Notes (Signed)
[facility] called for transport. 

## 2022-07-12 NOTE — Progress Notes (Signed)
Hospitalist Transfer Note:  Transferring facility: [facility] Requesting provider: Lurena Nida (EDP at [facility]) Reason for transfer: admission for further evaluation and management of constipation.     87  year old female who presented to [facility] ED complaining of 2 weeks of constipation, noting no bowel movement over that time in spite of multiple oral bowel regimen agents as an outpatient over the last 2 weeks.  Continues to produce flatus. Mild, crampy abdominal discomfort, with physical exam reportedly revealing no evidence of peritoneal signs.  Received Dulcolax suppository in the ED today, without any ensuing bowel movement noted.  Vital signs in the ED were notable for the following: Afebrile, normotensive.  Imaging notable for CT abdomen/pelvis showed significant stool burden, but in the absence of any evidence of bowel obstruction, perforation, or abscess.   Subsequently, I accepted this patient for transfer for observation to a MedSurg bed at [facility] or [facility] (first available) for further work-up and management of the above .       Nursing staff, Please call [facility] number on Amion [phone]) as soon as patient's arrival, so appropriate admitting provider can evaluate the pt.     Babs Bertin, DO Hospitalist

## 2022-07-12 NOTE — ED Provider Notes (Signed)
[facility] Provider Note   CSN: [id number] Arrival date & time: 07/12/22  1623     History  Chief Complaint  Patient presents with   Constipation    Amy Hickman is a 87 y.o. female, history of celiac's disease, who presents to the ED secondary to diffuse abdominal distention, nausea, dry heaving for the last 2 weeks.  She states she has not had a bowel movement in last 2 weeks, and has attempted to take MiraLAX, prune juice, Colace, mag citrate without any relief.  States that today she gets sick after drinking mag citrate she vomited, and turned beet red.  Notes that she feels just very uncomfortable, and full.  Her pants have not been fitting because her stomach is so enlarged.  Typically has a bowel movement every day. Not passing gas.  History of appendectomy, bowel resection, cholecystectomy, hysterectomy.   Home Medications Prior to Admission medications   Medication Sig Start Date End Date Taking? Authorizing Provider  acetaminophen (TYLENOL) 500 MG tablet Take 1 tablet (500 mg total) by mouth every 6 (six) hours as needed for pain. Patient taking differently: Take 1,000 mg by mouth every 6 (six) hours as needed for pain or moderate pain. 06/24/12   [name], MD  amiodarone (PACERONE) 200 MG tablet Take 1 tablet (200 mg total) by mouth daily. 03/05/22   Sherran Needs, NP  apixaban (ELIQUIS) 5 MG TABS tablet Take 1 tablet (5 mg total) by mouth 2 (two) times daily. 06/23/22   Buford Dresser, MD  B Complex Vitamins (VITAMIN B COMPLEX) TABS Take 1 tablet by mouth daily.    [provider]  carvedilol (COREG) 12.5 MG tablet Take 1 tablet (12.5 mg total) by mouth 2 (two) times daily. Replaces metoprolol 05/29/22 06/28/22  Buford Dresser, MD  clidinium-chlordiazePOXIDE (LIBRAX) 5-2.5 MG capsule Take 2 capsules by mouth daily as needed (cramping). 02/28/21   [provider]  CRANBERRY-VITAMIN C PO  Take 1 capsule by mouth in the morning.    [provider]  diltiazem (CARDIZEM CD) 360 MG 24 hr capsule Take 1 capsule (360 mg total) by mouth daily. 06/12/21   Buford Dresser, MD  esomeprazole (NEXIUM) 40 MG capsule Take 40 mg by mouth daily before breakfast.    [provider]  hydrALAZINE (APRESOLINE) 50 MG tablet Take 2 tablets (100 mg total) by mouth 3 (three) times daily. 06/23/22   Buford Dresser, MD  Hyoscyamine Sulfate SL 0.125 MG SUBL Place 0.125 mg under the tongue every 4 (four) hours as needed (cramping). 04/24/21   [provider]  isosorbide dinitrate (ISORDIL) 10 MG tablet Take 10 mg by mouth 2 (two) times daily. 12/31/21   [provider]  levothyroxine (SYNTHROID) 100 MCG tablet Take 100 mcg by mouth daily before breakfast. 02/09/22   [provider]  Netarsudil-Latanoprost (ROCKLATAN) 0.02-0.005 % SOLN Place 1 drop into both eyes at bedtime. 12/30/21   [provider]  polyvinyl alcohol (ARTIFICIAL TEARS) 1.4 % ophthalmic solution Place 1 drop into both eyes as needed for dry eyes.    [provider]  senna-docusate (SENOKOT-S) 8.6-50 MG tablet Take 2 tablets by mouth 2 (two) times daily. 03/22/22   Hosie Poisson, MD  Vitamin D, Ergocalciferol, (DRISDOL) 1.25 MG (50000 UNIT) CAPS capsule Take 50,000 Units by mouth every 7 (seven) days. 05/22/21   [provider]      Allergies    Ativan [lorazepam], Augmentin [amoxicillin-pot clavulanate], Avelox [moxifloxacin],  Brimonidine, Chlorzoxazone, Ciprofloxacin hcl, Clavulanic acid, Crestor [rosuvastatin], Ezetimibe, Gluten meal, Nitrofuran derivatives, Phenergan [promethazine hcl], Wheat, Codeine, Darvon [propoxyphene hcl], and Doxycycline    Review of Systems   Review of Systems  Gastrointestinal:  Positive for abdominal distention, abdominal pain, constipation, nausea and vomiting. Negative for diarrhea.    Physical Exam Updated Vital Signs BP (!)  154/78 (BP Location: Right Arm)   Pulse 66   Temp 97.6 F (36.4 C) (Oral)   Resp 18   Ht 5' (1.524 m)   Wt 65.9 kg   SpO2 97%   BMI 28.37 kg/m  Physical Exam Vitals and nursing note reviewed.  Constitutional:      General: She is not in acute distress.    Appearance: She is well-developed.  HENT:     Head: Normocephalic and atraumatic.  Eyes:     Conjunctiva/sclera: Conjunctivae normal.  Cardiovascular:     Rate and Rhythm: Normal rate and regular rhythm.     Heart sounds: No murmur heard. Pulmonary:     Effort: Pulmonary effort is normal. No respiratory distress.     Breath sounds: Normal breath sounds.  Abdominal:     General: There is distension.     Palpations: Abdomen is soft.     Tenderness: There is generalized abdominal tenderness.  Musculoskeletal:        General: No swelling.     Cervical back: Neck supple.  Skin:    General: Skin is warm and dry.     Capillary Refill: Capillary refill takes less than 2 seconds.  Neurological:     Mental Status: She is alert.  Psychiatric:        Mood and Affect: Mood normal.     ED Results / Procedures / Treatments   Labs (all labs ordered are listed, but only abnormal results are displayed) Labs Reviewed  CBC WITH DIFFERENTIAL/PLATELET  COMPREHENSIVE METABOLIC PANEL    EKG None  Radiology No results found.  Procedures Procedures    Medications Ordered in ED Medications  sodium chloride 0.9 % bolus 1,000 mL (1,000 mLs Intravenous New Bag/Given 07/12/22 1851)  ondansetron (ZOFRAN) injection 4 mg (4 mg Intravenous Given 07/12/22 1851)    ED Course/ Medical Decision Making/ A&P                             Medical Decision Making Patient is an 87 year old female, here for nausea no BM for the last 2 weeks, history of for abdominal surgeries, not passing gas, now having nausea, no p.o. intake today.  Has tried multiple laxatives without relief.  We will obtain a CT abdomen pelvis, for further evaluation I am  concerned for bowel obstruction as she is very distended.  We will start her on IV fluids and Zofran for relief of symptoms.  Amount and/or Complexity of Data Reviewed Labs: ordered. Radiology: ordered. Discussion of management or test interpretation with external provider(s): No labs no radiology back at this time, handed off to [facility] for further evaluation and disposition follow-up on results.  Risk Prescription drug management.   Final Clinical Impression(s) / ED Diagnoses Final diagnoses:  None    Rx / DC Orders ED Discharge Orders     None         Osvaldo Shipper, PA 07/12/22 1906    Wyvonnia Dusky, MD 07/12/22 2154

## 2022-07-12 NOTE — ED Provider Notes (Signed)
Patient was given at signout from [employer], PA-C.  Please review her note for patient's HPI and workup.  At this time awaiting blood labs and CT scan to evaluate for possible SBO.  Patient's been given Zofran for nausea and fluids.  Patient CT came back positive for large amount of stool in her bowel however no obstruction.  There did not appear to be stool in the rectal vault that could be dislodged rectally.  It appears after reading the previous provider's note patient has tried multiple laxatives without relief.  Patient may need admission for bowel cleanout due to failed outpatient treatment. Hospitalist will be consulted. Patient stable at this time.  Howerter, MD Hospitalist was consulted and patient was accepted to [facility].  Patient stable for admission at this time.  Clinical Course as of 07/12/22 2129  Amy Hickman Jul 12, 2022  2039 This is an 87 year old female with a history of multiple abdominal surgeries presenting to the ED with lack of bowel movement in 2 weeks and abdominal bloating.  She has been belching.  Difficulty with keeping down food, although no persistent vomiting.  On exam she does have some abdominal distention without significant tenderness or pain or complaint of abdominal pain.  Her labs are largely unremarkable.  Potassium is 4.8.  CT scan of the abdomen does not show bowel obstruction but shows a significant stool burden, without evident stool ball in the rectal vault.  I suspect this may be an ileus.  The patient has already attempted multiple laxative medications at home including MiraLAX, Dulcolax, GoLytely, none of which have produced a bowel movement.  At this point we would anticipate admitting her to the hospital, continuing with a bowel regimen.  The patient and her granddaughter are in agreement with the plan. [MT]    Clinical Course User Index [MT] Trifan, Carola Rhine, MD      Elvina Sidle 07/12/22 2212    Wyvonnia Dusky, MD 07/12/22 [phone]

## 2022-07-12 NOTE — ED Notes (Signed)
Pt with steady gait ambulating to restroom with daughter.

## 2022-07-12 NOTE — ED Triage Notes (Signed)
Pt state constipation with no BM in 2 weeks  Has tried miralax, prune juice, and colace with no relief  Drank Mag Citrate today with no relief  Reports abd distention and generalized discomfort

## 2022-07-12 NOTE — ED Notes (Signed)
[facility] at bedside 

## 2022-07-13 DIAGNOSIS — I4891 Unspecified atrial fibrillation: Secondary | ICD-10-CM | POA: Diagnosis not present

## 2022-07-13 DIAGNOSIS — K5900 Constipation, unspecified: Secondary | ICD-10-CM | POA: Diagnosis not present

## 2022-07-13 DIAGNOSIS — E039 Hypothyroidism, unspecified: Secondary | ICD-10-CM

## 2022-07-13 DIAGNOSIS — I10 Essential (primary) hypertension: Secondary | ICD-10-CM

## 2022-07-13 DIAGNOSIS — I824Y9 Acute embolism and thrombosis of unspecified deep veins of unspecified proximal lower extremity: Secondary | ICD-10-CM

## 2022-07-13 DIAGNOSIS — J449 Chronic obstructive pulmonary disease, unspecified: Secondary | ICD-10-CM

## 2022-07-13 LAB — CBC WITH DIFFERENTIAL/PLATELET
Abs Immature Granulocytes: 0.02 10*3/uL (ref 0.00–0.07)
Basophils Absolute: 0 10*3/uL (ref 0.0–0.1)
Basophils Relative: 0 %
Eosinophils Absolute: 0 10*3/uL (ref 0.0–0.5)
Eosinophils Relative: 1 %
HCT: 38.4 % (ref 36.0–46.0)
Hemoglobin: 12.1 g/dL (ref 12.0–15.0)
Immature Granulocytes: 0 %
Lymphocytes Relative: 12 %
Lymphs Abs: 0.8 10*3/uL (ref 0.7–4.0)
MCH: 30.6 pg (ref 26.0–34.0)
MCHC: 31.5 g/dL (ref 30.0–36.0)
MCV: 97 fL (ref 80.0–100.0)
Monocytes Absolute: 0.7 10*3/uL (ref 0.1–1.0)
Monocytes Relative: 10 %
Neutro Abs: 5.3 10*3/uL (ref 1.7–7.7)
Neutrophils Relative %: 77 %
Platelets: 204 10*3/uL (ref 150–400)
RBC: 3.96 MIL/uL (ref 3.87–5.11)
RDW: 14.6 % (ref 11.5–15.5)
WBC: 6.8 10*3/uL (ref 4.0–10.5)
nRBC: 0 % (ref 0.0–0.2)

## 2022-07-13 LAB — BASIC METABOLIC PANEL
Anion gap: 8 (ref 5–15)
BUN: 27 mg/dL — ABNORMAL HIGH (ref 8–23)
CO2: 23 mmol/L (ref 22–32)
Calcium: 8.8 mg/dL — ABNORMAL LOW (ref 8.9–10.3)
Chloride: 103 mmol/L (ref 98–111)
Creatinine, Ser: 1.16 mg/dL — ABNORMAL HIGH (ref 0.44–1.00)
GFR, Estimated: 46 mL/min — ABNORMAL LOW (ref >60)
Glucose, Bld: 135 mg/dL — ABNORMAL HIGH (ref 70–99)
Potassium: 5 mmol/L (ref 3.5–5.1)
Sodium: 134 mmol/L — ABNORMAL LOW (ref 135–145)

## 2022-07-13 LAB — TSH: TSH: 5.231 u[IU]/mL — ABNORMAL HIGH (ref 0.350–4.500)

## 2022-07-13 MED ORDER — DILTIAZEM HCL ER COATED BEADS 240 MG PO CP24
360.0000 mg | ORAL_CAPSULE | Freq: Every day | ORAL | Status: DC
  Administered 2022-07-13: 360 mg via ORAL
  Filled 2022-07-13: qty 1

## 2022-07-13 MED ORDER — SODIUM CHLORIDE 0.9 % IV SOLN: INTRAVENOUS | Status: AC

## 2022-07-13 MED ORDER — POLYETHYLENE GLYCOL 3350 17 G PO PACK: 17.0000 g | PACK | Freq: Two times a day (BID) | ORAL | 0 refills | Status: AC

## 2022-07-13 MED ORDER — SORBITOL 70 % SOLN
400.0000 mL | TOPICAL_OIL | Freq: Once | ORAL | Status: AC
  Administered 2022-07-13: 400 mL via RECTAL
  Filled 2022-07-13: qty 120

## 2022-07-13 MED ORDER — ISOSORBIDE DINITRATE 10 MG PO TABS
10.0000 mg | ORAL_TABLET | Freq: Two times a day (BID) | ORAL | Status: DC
  Administered 2022-07-13 (×2): 10 mg via ORAL
  Filled 2022-07-13 (×3): qty 1

## 2022-07-13 MED ORDER — HEPARIN SODIUM (PORCINE) 5000 UNIT/ML IJ SOLN: 5000.0000 [IU] | Freq: Three times a day (TID) | INTRAMUSCULAR | Status: DC

## 2022-07-13 MED ORDER — AMIODARONE HCL 200 MG PO TABS
200.0000 mg | ORAL_TABLET | Freq: Every day | ORAL | Status: DC
  Administered 2022-07-13: 200 mg via ORAL
  Filled 2022-07-13: qty 1

## 2022-07-13 MED ORDER — APIXABAN 5 MG PO TABS
5.0000 mg | ORAL_TABLET | Freq: Two times a day (BID) | ORAL | Status: DC
  Administered 2022-07-13: 5 mg via ORAL
  Filled 2022-07-13: qty 1

## 2022-07-13 MED ORDER — LEVOTHYROXINE SODIUM 100 MCG PO TABS
100.0000 ug | ORAL_TABLET | Freq: Every day | ORAL | Status: DC
  Administered 2022-07-13: 100 ug via ORAL
  Filled 2022-07-13: qty 1

## 2022-07-13 MED ORDER — ONDANSETRON HCL 4 MG/2ML IJ SOLN
4.0000 mg | Freq: Four times a day (QID) | INTRAMUSCULAR | Status: DC | PRN
  Administered 2022-07-13: 4 mg via INTRAVENOUS
  Filled 2022-07-13: qty 2

## 2022-07-13 MED ORDER — ACETAMINOPHEN 325 MG PO TABS: 650.0000 mg | ORAL_TABLET | Freq: Four times a day (QID) | ORAL | Status: DC | PRN

## 2022-07-13 MED ORDER — CARVEDILOL 12.5 MG PO TABS
12.5000 mg | ORAL_TABLET | Freq: Two times a day (BID) | ORAL | Status: DC
  Administered 2022-07-13 (×2): 12.5 mg via ORAL
  Filled 2022-07-13 (×2): qty 1

## 2022-07-13 MED ORDER — PROCHLORPERAZINE EDISYLATE 10 MG/2ML IJ SOLN
5.0000 mg | Freq: Once | INTRAMUSCULAR | Status: AC
  Administered 2022-07-13: 5 mg via INTRAVENOUS
  Filled 2022-07-13: qty 2

## 2022-07-13 MED ORDER — POLYETHYLENE GLYCOL 3350 17 G PO PACK
17.0000 g | PACK | Freq: Two times a day (BID) | ORAL | Status: DC
  Administered 2022-07-13: 17 g via ORAL
  Filled 2022-07-13: qty 1

## 2022-07-13 MED ORDER — ACETAMINOPHEN 650 MG RE SUPP: 650.0000 mg | Freq: Four times a day (QID) | RECTAL | Status: DC | PRN

## 2022-07-13 MED ORDER — HYDRALAZINE HCL 50 MG PO TABS
100.0000 mg | ORAL_TABLET | Freq: Three times a day (TID) | ORAL | Status: DC
  Administered 2022-07-13: 100 mg via ORAL
  Filled 2022-07-13: qty 2

## 2022-07-13 MED ORDER — ONDANSETRON HCL 4 MG PO TABS: 4.0000 mg | ORAL_TABLET | Freq: Four times a day (QID) | ORAL | Status: DC | PRN

## 2022-07-13 MED ORDER — SENNA 8.6 MG PO TABS
1.0000 | ORAL_TABLET | Freq: Two times a day (BID) | ORAL | Status: DC
  Administered 2022-07-13 (×2): 8.6 mg via ORAL
  Filled 2022-07-13 (×2): qty 1

## 2022-07-13 NOTE — TOC CM/SW Note (Signed)
Transition of Care [facility]) Screening Note  Patient Details  Name: Amy Hickman Date of Birth: 1935/12/19  Transition of Care [facility] CM/SW Contact:    Sherie Don, LCSW Phone Number: 07/13/2022, 9:31 AM  Transition of Care Department [facility]) has reviewed patient and no TOC needs have been identified at this time. We will continue to monitor patient advancement through interdisciplinary progression rounds. If new patient transition needs arise, please place a TOC consult.

## 2022-07-13 NOTE — H&P (Addendum)
PCP:   [facility]   Chief Complaint:  Constipation  HPI: This is a 87 y.o. female with a hx of persistent atrial fibrillation, HTN, OSA on CPAP, CAD, CKD stage 3, GERD, COPD, and celiac disease.  Patient presents after she has not had a bowel movement in 2 weeks.  Per patient she has tried every laxative there is.  She has tried senna, hot and cold prune juice, MiraLAX, Colace, mag citrate and IV fluid hydration.  Nothing has come.  Per patient her stomach has become very distended and painful and hurt quite a bit.  She came to the [facility].  Her last colonoscopy was in 2015.  She states that the caliber of her stool has not changed nor has her weight.  Her appetite is great.  No history of coronary artery disease, chest pains or congestive heart failure history.  Patient lives alone in a [place] home.  Patient pretty active, he does his ADLs without any issues.  Review of Systems:  The patient denies anorexia, fever, weight loss,, vision loss, decreased hearing, hoarseness, chest pain, syncope, dyspnea on exertion, peripheral edema, balance deficits, hemoptysis, melena, hematochezia, severe indigestion/heartburn, hematuria, incontinence, genital sores, muscle weakness, suspicious skin lesions, transient blindness, difficulty walking, depression, unusual weight change, abnormal bleeding, enlarged lymph nodes, angioedema, and breast masses. Positives: Constipation, abdominal pain  Past Medical History: Past Medical History:  Diagnosis Date   Atrial fibrillation, chronic (HCC)    s/p multiple cardioversions with sustained NSR, on Eliquis   Celiac disease    Chronic systolic CHF (congestive heart failure) (HCC)    Coronary artery disease involving native coronary artery of native heart without angina pectoris 03/15/2021   GERD (gastroesophageal reflux disease)    HOH (hard of hearing)    Hyperparathyroidism ([place]) 12/12/2020   Hypertension     Interstitial cystitis    Past Surgical History:  Procedure Laterality Date   ABDOMINAL HYSTERECTOMY     APPENDECTOMY     CARDIOVERSION N/A 04/24/2021   Procedure: CARDIOVERSION;  Surgeon: Jerline Pain, MD;  Location: [facility];  Service: Cardiovascular;  Laterality: N/A;   CARDIOVERSION N/A 06/12/2021   Procedure: CARDIOVERSION;  Surgeon: Buford Dresser, MD;  Location: [facility] ENDOSCOPY;  Service: Cardiovascular;  Laterality: N/A;   CARDIOVERSION N/A 08/11/2021   Procedure: CARDIOVERSION;  Surgeon: [name], MD;  Location: [facility] ENDOSCOPY;  Service: Cardiovascular;  Laterality: N/A;   CHOLECYSTECTOMY     ear drum surgery     REPLACEMENT TOTAL KNEE     ROTATOR CUFF REPAIR Right    TONSILLECTOMY      Medications: Prior to Admission medications   Medication Sig Start Date End Date Taking? Authorizing Provider  acetaminophen (TYLENOL) 500 MG tablet Take 1 tablet (500 mg total) by mouth every 6 (six) hours as needed for pain. Patient taking differently: Take 1,000 mg by mouth every 6 (six) hours as needed for pain or moderate pain. 06/24/12   [name], MD  amiodarone (PACERONE) 200 MG tablet Take 1 tablet (200 mg total) by mouth daily. 03/05/22   Sherran Needs, NP  apixaban (ELIQUIS) 5 MG TABS tablet Take 1 tablet (5 mg total) by mouth 2 (two) times daily. 06/23/22   Buford Dresser, MD  B Complex Vitamins (VITAMIN B COMPLEX) TABS Take 1 tablet by mouth daily.    [provider]  carvedilol (COREG) 12.5 MG tablet Take 1 tablet (12.5 mg total) by mouth 2 (two) times daily. Replaces  metoprolol 05/29/22 06/28/22  Buford Dresser, MD  clidinium-chlordiazePOXIDE (LIBRAX) 5-2.5 MG capsule Take 2 capsules by mouth daily as needed (cramping). 02/28/21   [provider]  CRANBERRY-VITAMIN C PO Take 1 capsule by mouth in the morning.    [provider]  diltiazem (CARDIZEM CD) 360 MG 24 hr capsule Take 1 capsule (360 mg total) by mouth daily.  06/12/21   Buford Dresser, MD  esomeprazole (NEXIUM) 40 MG capsule Take 40 mg by mouth daily before breakfast.    [provider]  hydrALAZINE (APRESOLINE) 50 MG tablet Take 2 tablets (100 mg total) by mouth 3 (three) times daily. 06/23/22   Buford Dresser, MD  Hyoscyamine Sulfate SL 0.125 MG SUBL Place 0.125 mg under the tongue every 4 (four) hours as needed (cramping). 04/24/21   [provider]  isosorbide dinitrate (ISORDIL) 10 MG tablet Take 10 mg by mouth 2 (two) times daily. 12/31/21   [provider]  levothyroxine (SYNTHROID) 100 MCG tablet Take 100 mcg by mouth daily before breakfast. 02/09/22   [provider]  Netarsudil-Latanoprost (ROCKLATAN) 0.02-0.005 % SOLN Place 1 drop into both eyes at bedtime. 12/30/21   [provider]  polyvinyl alcohol (ARTIFICIAL TEARS) 1.4 % ophthalmic solution Place 1 drop into both eyes as needed for dry eyes.    [provider]  senna-docusate (SENOKOT-S) 8.6-50 MG tablet Take 2 tablets by mouth 2 (two) times daily. 03/22/22   Hosie Poisson, MD  Vitamin D, Ergocalciferol, (DRISDOL) 1.25 MG (50000 UNIT) CAPS capsule Take 50,000 Units by mouth every 7 (seven) days. 05/22/21   [provider]    Allergies:   Allergies  Allergen Reactions   Ativan [Lorazepam] Other (See Comments)    "Knocked her out, woke up 3 days later"   Augmentin [Amoxicillin-Pot Clavulanate] Nausea And Vomiting   Avelox [Moxifloxacin] Itching and Nausea Only   Brimonidine Itching and Swelling    Other reaction(s): Other (See Comments) redness   Chlorzoxazone Other (See Comments)    Went crazy/loopy   Ciprofloxacin Hcl Itching and Nausea Only   Clavulanic Acid     Other reaction(s): Unknown   Crestor [Rosuvastatin] Other (See Comments)    myalgia   Ezetimibe Diarrhea    Other reaction(s): Other (See Comments) Stomach cramps   Gluten Meal Other (See Comments)    Diarrhea, hot/cold sweat, will sleep for a  couple of hours - Celiac disease   Nitrofuran Derivatives Itching and Nausea Only   Phenergan [Promethazine Hcl] Other (See Comments)    hallucinations   Wheat     Other reaction(s): Other (See Comments) Gi distress- celiac disease   Codeine Palpitations   Darvon [Propoxyphene Hcl] Palpitations   Doxycycline Itching and Nausea Only    Social History:  reports that she quit smoking about 43 years ago. Her smoking use included cigarettes. She has a 66.00 pack-year smoking history. She has never used smokeless tobacco. She reports that she does not currently use alcohol. She reports that she does not use drugs.  Family History: Family History  Problem Relation Age of Onset   Hypertension Other     Physical Exam: Vitals:   07/12/22 2130 07/12/22 2200 07/12/22 2249 07/13/22 0012  BP: (!) 169/69  (!) 185/78 (!) 182/76  Pulse: (!) 59 62 64 61  Resp: 14  16 20   Temp:    98.3 F (36.8 C)  TempSrc:    Oral  SpO2: 96% 97% 96% 97%  Weight:      Height:  General:  Alert and oriented times three, well developed and nourished, no acute distress Eyes: PERRLA, pink conjunctiva, no scleral icterus ENT: Moist oral mucosa, neck supple, no thyromegaly Lungs: clear to ascultation, no wheeze, no crackles, no use of accessory muscles Cardiovascular: regular rate and rhythm, no regurgitation, no gallops, no murmurs. No carotid bruits, no JVD Abdomen: soft, positive BS, non-tender, somewhat distended, no organomegaly, not an acute abdomen GU: not examined Neuro: CN II - XII grossly intact, sensation intact Musculoskeletal: strength 5/5 all extremities, no clubbing, cyanosis or edema Skin: no rash, no subcutaneous crepitation, no decubitus Psych: appropriate patient   Labs on Admission:  Recent Labs    07/12/22 1845  NA 133*  K 4.8  CL 100  CO2 26  GLUCOSE 113*  BUN 33*  CREATININE 1.33*  CALCIUM 9.3   Recent Labs    07/12/22 1845  AST 35  ALT 30  ALKPHOS 51  BILITOT 0.7   PROT 6.7  ALBUMIN 3.8    Recent Labs    07/12/22 1845  WBC 7.2  NEUTROABS 5.5  HGB 12.7  HCT 39.2  MCV 94.9  PLT 221    Radiological Exams on Admission: CT ABDOMEN PELVIS W CONTRAST  Result Date: 07/12/2022 CLINICAL DATA:  Bowel obstruction, constipation, abdominal distention and discomfort, no bowel movement for 2 weeks, no relief with MiraLax, prune juice, Colace, Mag citrate EXAM: CT ABDOMEN AND PELVIS WITH CONTRAST TECHNIQUE: Multidetector CT imaging of the abdomen and pelvis was performed using the standard protocol following bolus administration of intravenous contrast. RADIATION DOSE REDUCTION: This exam was performed according to the departmental dose-optimization program which includes automated exposure control, adjustment of the mA and/or kV according to patient size and/or use of iterative reconstruction technique. CONTRAST:  58mL OMNIPAQUE IOHEXOL 300 MG/ML SOLN IV. No oral contrast. COMPARISON:  06/22/2022 FINDINGS: Lower chest: Lung bases clear. Enlargement of cardiac chambers. Coronary arterial calcifications. Hepatobiliary: Gallbladder surgically absent. Mild intrahepatic and extrahepatic biliary dilatation, may be physiologic post cholecystectomy, correlate with LFTs. No focal hepatic masses. Pancreas: Normal appearance Spleen: Normal appearance Adrenals/Urinary Tract: Adrenal glands thickened without focal abnormality. Atrophic kidneys without mass or hydronephrosis. No ureteral calcification or dilatation. Low descent of bladder in pelvis consistent with cystocele. Stomach/Bowel: Increased stool in colon. Food debris in stomach. Small bowel loops decompressed. Appendix surgically absent by history. No bowel wall thickening or discrete point of obstruction. Vascular/Lymphatic: Atherosclerotic calcifications aorta, iliac arteries, visceral arteries. Aorta normal caliber. IVC filter noted, tip at the level of the LEFT renal vein. No adenopathy. Reproductive: Uterus surgically  absent. Nonvisualization of ovaries. Other: No free air or free fluid. Surgical clips at GE junction region. No inflammatory process or hernia. Musculoskeletal: Osseous demineralization. IMPRESSION: Increased stool in colon. No evidence of bowel obstruction. Mild intrahepatic and extrahepatic biliary dilatation, may be physiologic post cholecystectomy, correlate with LFTs. Low descent of bladder in pelvis consistent with cystocele. Aortic Atherosclerosis (ICD10-I70.0). Electronically Signed   By: Lavonia Dana M.D.   On: 07/12/2022 19:55    Assessment/Plan Present on Admission:  Constipation -Smog enema ordered -IVF hydration initiated -TSH and CEA levels ordered -Bowel regimen miralax BID   Atrial fibrillation (HCC)/ DVT (deep venous thrombosis) (HCC)  Secondary hypercoagulable state ([place]) -Stable, resume diltiazem, amiodarone and Eliquis   Essential hypertension/ CAD -Resume Coreg, diltiazem, hydralazine -Resume Imdur, Eliquis   Hypothyroidism -Stable, resume Synthroid  Amy Hickman 07/13/2022, 12:20 AM

## 2022-07-13 NOTE — Hospital Course (Signed)
Amy Hickman is an 87 y.o. F with pAF on amio/Eliquis, HTN, OSA on CPAP, CKD IIIb baseline 1.3, Celiac and COPD not on O2 who presented with constipation.  In the ER, CT shwoed constipation only.  Manual disimpaction not attmepted, admitted for enema.

## 2022-07-13 NOTE — Discharge Summary (Signed)
Physician Discharge Summary   Patient: Amy Hickman MRN: [id number] DOB: 10-14-1935  Admit date:     07/12/2022  Discharge date: 07/13/22  Discharge Physician: Edwin Dada   PCP: [facility]     Recommendations at discharge:  Follow up with PCP for constipation in 1 week     Discharge Diagnoses: Principal Problem:   Constipation Active Problems:   Hypothyroidism   Essential hypertension   Secondary hypercoagulable state ([place])   Malnutrition of moderate degree   DVT (deep venous thrombosis) (HCC)   Atrial fibrillation (HCC)   COPD (chronic obstructive pulmonary disease) [facility])      Hospital Course: Amy Hickman is an 87 y.o. F with pAF on amio/Eliquis, HTN, OSA on CPAP, CKD IIIb baseline 1.3, Celiac and COPD not on O2 who presented with constipation.  In the ER, CT shwoed constipation only.  Manual disimpaction not attmepted, admitted for enema.   Overnight, patient administered MiraLAX, senna, bisacodyl suppository and enema.  Had 2 small bowel movements.  Able to eat breakfast and lunch without difficulty.            The [facility] Controlled Substances Registry was reviewed for this patient prior to discharge.   Consultants: None Procedures performed: Enema  Disposition: Home Diet recommendation:  Cardiac diet  DISCHARGE MEDICATION: Allergies as of 07/13/2022       Reactions   Cephalexin Other (See Comments)   Abdominal pain , GI distress, Abdominal pain , GI distress   Hydrocodone-acetaminophen Other (See Comments), Rash   Palpitations, flushing, hyperactivity, Chest pain, Chest pain   Ativan [lorazepam] Other (See Comments)   "Knocked her out, woke up 3 days later"   Augmentin [amoxicillin-pot Clavulanate] Nausea And Vomiting   Avelox [moxifloxacin] Itching, Nausea Only   Brimonidine Itching, Swelling   redness   Chlorzoxazone Other (See Comments)   Went crazy/loopy   Ciprofloxacin Hcl  Itching, Nausea Only   Clavulanic Acid Other (See Comments)    Unknown   Crestor [rosuvastatin] Other (See Comments)   myalgia   Ezetimibe Diarrhea   Stomach cramps   Gluten Meal Other (See Comments)   Diarrhea, hot/cold sweat, will sleep for a couple of hours - Celiac disease   Lansoprazole Other (See Comments)   GI intolerance   Nitrofuran Derivatives Itching, Nausea Only   Phenergan [promethazine Hcl] Other (See Comments)   hallucinations   Wheat Other (See Comments)   Gi distress- celiac disease   Codeine Palpitations   Darvon [propoxyphene Hcl] Palpitations   Doxycycline Itching, Nausea Only        Medication List     STOP taking these medications    Hyoscyamine Sulfate SL 0.125 MG Subl       TAKE these medications    acetaminophen 500 MG tablet Commonly known as: TYLENOL Take 1 tablet (500 mg total) by mouth every 6 (six) hours as needed for pain. What changed:  how much to take reasons to take this   amiodarone 200 MG tablet Commonly known as: PACERONE Take 1 tablet (200 mg total) by mouth daily.   apixaban 5 MG Tabs tablet Commonly known as: ELIQUIS Take 1 tablet (5 mg total) by mouth 2 (two) times daily.   Artificial Tears 1.4 % ophthalmic solution Generic drug: polyvinyl alcohol Place 1 drop into both eyes as needed for dry eyes.   carvedilol 12.5 MG tablet Commonly known as: COREG Take 1 tablet (12.5 mg total) by mouth 2 (two) times  daily. Replaces metoprolol   clidinium-chlordiazePOXIDE 5-2.5 MG capsule Commonly known as: LIBRAX Take 2 capsules by mouth daily as needed (cramping).   CRANBERRY-VITAMIN C PO Take 1 capsule by mouth in the morning.   diltiazem 360 MG 24 hr capsule Commonly known as: CARDIZEM CD Take 1 capsule (360 mg total) by mouth daily.   esomeprazole 40 MG capsule Commonly known as: NEXIUM Take 40 mg by mouth daily before breakfast.   hydrALAZINE 50 MG tablet Commonly known as: APRESOLINE Take 2 tablets (100 mg  total) by mouth 3 (three) times daily.   isosorbide dinitrate 10 MG tablet Commonly known as: ISORDIL Take 10 mg by mouth 2 (two) times daily.   levothyroxine 100 MCG tablet Commonly known as: SYNTHROID Take 100 mcg by mouth daily before breakfast.   polyethylene glycol 17 g packet Commonly known as: MIRALAX / GLYCOLAX Take 17 g by mouth 2 (two) times daily.   Rocklatan 0.02-0.005 % Soln Generic drug: Netarsudil-Latanoprost Place 1 drop into both eyes at bedtime.   senna-docusate 8.6-50 MG tablet Commonly known as: Senokot-S Take 2 tablets by mouth 2 (two) times daily.   Vitamin B Complex Tabs Take 1 tablet by mouth daily.   Vitamin D (Ergocalciferol) 1.25 MG (50000 UNIT) Caps capsule Commonly known as: DRISDOL Take 50,000 Units by mouth every 7 (seven) days.        Follow-up [facility] Schedule an appointment as soon as possible for a visit in 1 week(s).   Contact information: [address] [place] [address] [phone]                 Discharge Instructions     Discharge instructions   Complete by: As directed    **IMPORTANT DISCHARGE INSTRUCTIONS**   From Dr. Loleta Books: You were admitted for constipation. Thankfully, your CT scan showed no signs of bowel obstruction, mass, perforation or bleeding  For the constipation, you should take MiraLAX twice daily and Senokot twice daily for the next 2-3 days Stay hydrated  After a few days, you can back off and just take things like probiotics, prune juice Stay hydrated  Go see your primary doctor in 1 week  Levbid (hyoscyamine) is a medicine on your medicine list as an "as needed" medicine for abdominal cramps This can cause some constipation, so avoid it Stay hydrated   Another option to try in the future, which is safer for an 87 y.o. than Mag Citrate is bisacodyl Dulcolax suppositories.   Increase activity slowly   Complete by: As  directed        Discharge Exam: Filed Weights   07/12/22 1632  Weight: 65.9 kg    General: Pt is alert, awake, not in acute distress Cardiovascular: RRR, nl S1-S2, no murmurs appreciated.   No LE edema.   Respiratory: Normal respiratory rate and rhythm.  CTAB without rales or wheezes. Abdominal: Abdomen soft and non-tender.  Mild distension, no HSM.   Neuro/Psych: Strength symmetric in upper and lower extremities.  Judgment and insight appear normal.   Condition at discharge: good  The results of significant diagnostics from this hospitalization (including imaging, microbiology, ancillary and laboratory) are listed below for reference.   Imaging Studies: CT ABDOMEN PELVIS W CONTRAST  Result Date: 07/12/2022 CLINICAL DATA:  Bowel obstruction, constipation, abdominal distention and discomfort, no bowel movement for 2 weeks, no relief with MiraLax, prune juice, Colace, Mag citrate EXAM: CT ABDOMEN AND PELVIS WITH  CONTRAST TECHNIQUE: Multidetector CT imaging of the abdomen and pelvis was performed using the standard protocol following bolus administration of intravenous contrast. RADIATION DOSE REDUCTION: This exam was performed according to the departmental dose-optimization program which includes automated exposure control, adjustment of the mA and/or kV according to patient size and/or use of iterative reconstruction technique. CONTRAST:  55mL OMNIPAQUE IOHEXOL 300 MG/ML SOLN IV. No oral contrast. COMPARISON:  06/22/2022 FINDINGS: Lower chest: Lung bases clear. Enlargement of cardiac chambers. Coronary arterial calcifications. Hepatobiliary: Gallbladder surgically absent. Mild intrahepatic and extrahepatic biliary dilatation, may be physiologic post cholecystectomy, correlate with LFTs. No focal hepatic masses. Pancreas: Normal appearance Spleen: Normal appearance Adrenals/Urinary Tract: Adrenal glands thickened without focal abnormality. Atrophic kidneys without mass or hydronephrosis. No  ureteral calcification or dilatation. Low descent of bladder in pelvis consistent with cystocele. Stomach/Bowel: Increased stool in colon. Food debris in stomach. Small bowel loops decompressed. Appendix surgically absent by history. No bowel wall thickening or discrete point of obstruction. Vascular/Lymphatic: Atherosclerotic calcifications aorta, iliac arteries, visceral arteries. Aorta normal caliber. IVC filter noted, tip at the level of the LEFT renal vein. No adenopathy. Reproductive: Uterus surgically absent. Nonvisualization of ovaries. Other: No free air or free fluid. Surgical clips at GE junction region. No inflammatory process or hernia. Musculoskeletal: Osseous demineralization. IMPRESSION: Increased stool in colon. No evidence of bowel obstruction. Mild intrahepatic and extrahepatic biliary dilatation, may be physiologic post cholecystectomy, correlate with LFTs. Low descent of bladder in pelvis consistent with cystocele. Aortic Atherosclerosis (ICD10-I70.0). Electronically Signed   By: Lavonia Dana M.D.   On: 07/12/2022 19:55   CT RENAL STONE STUDY  Result Date: 06/22/2022 CLINICAL DATA:  Low back pain with burning with urination. Decreased appetite. Kidney stone suspected. EXAM: CT ABDOMEN AND PELVIS WITHOUT CONTRAST TECHNIQUE: Multidetector CT imaging of the abdomen and pelvis was performed following the standard protocol without IV contrast. RADIATION DOSE REDUCTION: This exam was performed according to the departmental dose-optimization program which includes automated exposure control, adjustment of the mA and/or kV according to patient size and/or use of iterative reconstruction technique. COMPARISON:  Abdominopelvic CT 12/10/2021. FINDINGS: Lower chest: Clear lung bases. No significant pleural or pericardial effusion. Aortic and coronary artery atherosclerosis. Hepatobiliary: No focal liver lesion identified on noncontrast imaging. The liver has a stable appearance. Presumed remote  cholecystectomy without significant biliary dilatation. Pancreas: Unremarkable. No pancreatic ductal dilatation or surrounding inflammatory changes. Spleen: Normal in size without focal abnormality. Adrenals/Urinary Tract: Both adrenal glands appear normal. No evidence of urinary tract calculus, suspicious renal lesion or hydronephrosis. There is air throughout the bladder wall as well as the bladder lumen. No definite air seen within the ureters or intrarenal collecting systems. There are no perinephric inflammatory changes. Stomach/Bowel: No enteric contrast administered. Probable chronic postsurgical changes in the distal stomach and distal small bowel. The stomach is collapsed and suboptimally evaluated. Mildly prominent stool throughout the colon. No significant bowel distension, wall thickening, pneumatosis or surrounding inflammation identified. Vascular/Lymphatic: There are no enlarged abdominal or pelvic lymph nodes. Aortic and branch vessel atherosclerosis without evidence of aneurysm. 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 Reproductive: Hysterectomy. No evidence of adnexal mass. Probable pelvic floor laxity. Other: Postsurgical changes in the anterior abdominal wall. No hernia, pneumoperitoneum or significant ascites. Musculoskeletal: No acute or significant osseous findings. Multilevel spondylosis associated with a convex left lumbar scoliosis. There are degenerative changes of both hips, greater on the right. IMPRESSION: 1. Emphysematous cystitis. 2. No evidence of urinary tract calculus or hydronephrosis. 3. No other acute  abdominopelvic findings. 4. Probable chronic postsurgical changes in the stomach and distal small bowel. 5. Probable pelvic floor laxity. 6. Aortic Atherosclerosis (ICD10-I70.0) and Emphysema (ICD10-J43.9). Electronically Signed   By: Richardean Sale M.D.   On: 06/22/2022 11:45   DG Foot Complete Right  Result Date: 06/14/2022 CLINICAL DATA:  Right foot pain after injury  EXAM: RIGHT FOOT COMPLETE - 3+ VIEW COMPARISON:  03/20/2022 FINDINGS: There is no evidence of fracture or dislocation. Similar degenerative changes of the right foot. Plantar calcaneal spur. Atherosclerotic vascular calcifications. Soft tissues are unremarkable. IMPRESSION: Negative. Electronically Signed   By: Davina Poke D.O.   On: 06/14/2022 09:19    Microbiology: Results for orders placed or performed during the hospital encounter of 06/22/22  Urine Culture     Status: Abnormal   Collection Time: 06/22/22  9:23 AM   Specimen: Urine, Random  Result Value Ref Range Status   Specimen Description   Final    URINE, RANDOM Performed at [facility], [address] [place], [place] [address]    Special Requests   Final    NONE Performed at [facility], [address] [place], [place] [address]    Culture >=100,000 COLONIES/mL KLEBSIELLA PNEUMONIAE (A)  Final   Report Status 06/24/2022 FINAL  Final   Organism ID, Bacteria KLEBSIELLA PNEUMONIAE (A)  Final      Susceptibility   Klebsiella pneumoniae - MIC*    AMPICILLIN >=32 RESISTANT Resistant     CEFAZOLIN <=4 SENSITIVE Sensitive     CEFEPIME <=0.12 SENSITIVE Sensitive     CEFTRIAXONE <=0.25 SENSITIVE Sensitive     CIPROFLOXACIN <=0.25 SENSITIVE Sensitive     GENTAMICIN <=1 SENSITIVE Sensitive     IMIPENEM <=0.25 SENSITIVE Sensitive     NITROFURANTOIN 128 RESISTANT Resistant     TRIMETH/SULFA <=20 SENSITIVE Sensitive     AMPICILLIN/SULBACTAM 8 SENSITIVE Sensitive     PIP/TAZO <=4 SENSITIVE Sensitive     * >=100,000 COLONIES/mL KLEBSIELLA PNEUMONIAE    Labs: CBC: Recent Labs  Lab 07/12/22 1845 07/13/22 0111  WBC 7.2 6.8  NEUTROABS 5.5 5.3  HGB 12.7 12.1  HCT 39.2 38.4  MCV 94.9 97.0  PLT 221 0000000   Basic Metabolic Panel: Recent Labs  Lab 07/12/22 1845 07/13/22 0111  NA 133* 134*  K 4.8 5.0  CL 100 103  CO2 26 23  GLUCOSE 113* 135*  BUN 33* 27*  CREATININE 1.33* 1.16*  CALCIUM 9.3  8.8*   Liver Function Tests: Recent Labs  Lab 07/12/22 1845  AST 35  ALT 30  ALKPHOS 51  BILITOT 0.7  PROT 6.7  ALBUMIN 3.8   CBG: No results for input(s): "GLUCAP" in the last 168 hours.  Discharge time spent: approximately 35 minutes spent on discharge counseling, evaluation of patient on day of discharge, and coordination of discharge planning with nursing, social work, pharmacy and case management  Signed: Edwin Dada, MD [facility] 07/13/2022

## 2022-07-15 LAB — CEA: CEA: 4.6 ng/mL (ref 0.0–4.7)

## 2022-07-17 ENCOUNTER — Encounter (HOSPITAL_BASED_OUTPATIENT_CLINIC_OR_DEPARTMENT_OTHER): Payer: Self-pay | Admitting: *Deleted

## 2022-07-17 ENCOUNTER — Emergency Department (HOSPITAL_BASED_OUTPATIENT_CLINIC_OR_DEPARTMENT_OTHER): Payer: Medicare Other

## 2022-07-17 ENCOUNTER — Inpatient Hospital Stay (HOSPITAL_BASED_OUTPATIENT_CLINIC_OR_DEPARTMENT_OTHER)
Admission: EM | Admit: 2022-07-17 | Discharge: 2022-07-20 | DRG: 392 | Disposition: A | Discharge status: Home Health | Payer: Medicare Other | Attending: Internal Medicine | Admitting: Internal Medicine

## 2022-07-17 ENCOUNTER — Inpatient Hospital Stay (HOSPITAL_COMMUNITY): Payer: Medicare Other

## 2022-07-17 ENCOUNTER — Other Ambulatory Visit: Payer: Self-pay

## 2022-07-17 DIAGNOSIS — N179 Acute kidney failure, unspecified: Secondary | ICD-10-CM | POA: Diagnosis present

## 2022-07-17 DIAGNOSIS — E213 Hyperparathyroidism, unspecified: Secondary | ICD-10-CM | POA: Diagnosis not present

## 2022-07-17 DIAGNOSIS — I509 Heart failure, unspecified: Secondary | ICD-10-CM | POA: Diagnosis not present

## 2022-07-17 DIAGNOSIS — N184 Chronic kidney disease, stage 4 (severe): Secondary | ICD-10-CM | POA: Diagnosis present

## 2022-07-17 DIAGNOSIS — Z8249 Family history of ischemic heart disease and other diseases of the circulatory system: Secondary | ICD-10-CM

## 2022-07-17 DIAGNOSIS — K6389 Other specified diseases of intestine: Secondary | ICD-10-CM | POA: Diagnosis present

## 2022-07-17 DIAGNOSIS — E039 Hypothyroidism, unspecified: Secondary | ICD-10-CM | POA: Diagnosis present

## 2022-07-17 DIAGNOSIS — Z87891 Personal history of nicotine dependence: Secondary | ICD-10-CM

## 2022-07-17 DIAGNOSIS — K900 Celiac disease: Secondary | ICD-10-CM | POA: Diagnosis present

## 2022-07-17 DIAGNOSIS — Z90710 Acquired absence of both cervix and uterus: Secondary | ICD-10-CM

## 2022-07-17 DIAGNOSIS — K5901 Slow transit constipation: Secondary | ICD-10-CM | POA: Diagnosis present

## 2022-07-17 DIAGNOSIS — Z79890 Hormone replacement therapy (postmenopausal): Secondary | ICD-10-CM | POA: Diagnosis not present

## 2022-07-17 DIAGNOSIS — Z8719 Personal history of other diseases of the digestive system: Secondary | ICD-10-CM

## 2022-07-17 DIAGNOSIS — K5900 Constipation, unspecified: Secondary | ICD-10-CM | POA: Diagnosis present

## 2022-07-17 DIAGNOSIS — I5022 Chronic systolic (congestive) heart failure: Secondary | ICD-10-CM | POA: Diagnosis present

## 2022-07-17 DIAGNOSIS — Z881 Allergy status to other antibiotic agents status: Secondary | ICD-10-CM

## 2022-07-17 DIAGNOSIS — N3010 Interstitial cystitis (chronic) without hematuria: Secondary | ICD-10-CM | POA: Diagnosis present

## 2022-07-17 DIAGNOSIS — Z91018 Allergy to other foods: Secondary | ICD-10-CM

## 2022-07-17 DIAGNOSIS — M549 Dorsalgia, unspecified: Secondary | ICD-10-CM | POA: Diagnosis present

## 2022-07-17 DIAGNOSIS — R933 Abnormal findings on diagnostic imaging of other parts of digestive tract: Secondary | ICD-10-CM

## 2022-07-17 DIAGNOSIS — Z96659 Presence of unspecified artificial knee joint: Secondary | ICD-10-CM | POA: Diagnosis present

## 2022-07-17 DIAGNOSIS — Z79899 Other long term (current) drug therapy: Secondary | ICD-10-CM | POA: Diagnosis not present

## 2022-07-17 DIAGNOSIS — E669 Obesity, unspecified: Secondary | ICD-10-CM | POA: Diagnosis present

## 2022-07-17 DIAGNOSIS — G8929 Other chronic pain: Secondary | ICD-10-CM | POA: Diagnosis present

## 2022-07-17 DIAGNOSIS — Z885 Allergy status to narcotic agent status: Secondary | ICD-10-CM

## 2022-07-17 DIAGNOSIS — K219 Gastro-esophageal reflux disease without esophagitis: Secondary | ICD-10-CM | POA: Diagnosis present

## 2022-07-17 DIAGNOSIS — I4892 Unspecified atrial flutter: Secondary | ICD-10-CM | POA: Diagnosis present

## 2022-07-17 DIAGNOSIS — E875 Hyperkalemia: Secondary | ICD-10-CM | POA: Diagnosis present

## 2022-07-17 DIAGNOSIS — I2510 Atherosclerotic heart disease of native coronary artery without angina pectoris: Secondary | ICD-10-CM | POA: Diagnosis present

## 2022-07-17 DIAGNOSIS — I482 Chronic atrial fibrillation, unspecified: Secondary | ICD-10-CM | POA: Diagnosis present

## 2022-07-17 DIAGNOSIS — Z9049 Acquired absence of other specified parts of digestive tract: Secondary | ICD-10-CM

## 2022-07-17 DIAGNOSIS — Z9889 Other specified postprocedural states: Secondary | ICD-10-CM | POA: Diagnosis not present

## 2022-07-17 DIAGNOSIS — Z7901 Long term (current) use of anticoagulants: Secondary | ICD-10-CM | POA: Diagnosis not present

## 2022-07-17 DIAGNOSIS — I152 Hypertension secondary to endocrine disorders: Secondary | ICD-10-CM | POA: Diagnosis present

## 2022-07-17 DIAGNOSIS — I484 Atypical atrial flutter: Secondary | ICD-10-CM | POA: Diagnosis not present

## 2022-07-17 DIAGNOSIS — Z888 Allergy status to other drugs, medicaments and biological substances status: Secondary | ICD-10-CM

## 2022-07-17 DIAGNOSIS — N1832 Chronic kidney disease, stage 3b: Secondary | ICD-10-CM | POA: Diagnosis present

## 2022-07-17 DIAGNOSIS — Z6830 Body mass index (BMI) 30.0-30.9, adult: Secondary | ICD-10-CM

## 2022-07-17 LAB — CBC
HCT: 35.8 % — ABNORMAL LOW (ref 36.0–46.0)
HCT: 34.8 % — ABNORMAL LOW (ref 36.0–46.0)
Hemoglobin: 11.4 g/dL — ABNORMAL LOW (ref 12.0–15.0)
Hemoglobin: 11.3 g/dL — ABNORMAL LOW (ref 12.0–15.0)
MCH: 30.9 pg (ref 26.0–34.0)
MCH: 31.5 pg (ref 26.0–34.0)
MCHC: 31.8 g/dL (ref 30.0–36.0)
MCHC: 32.5 g/dL (ref 30.0–36.0)
MCV: 97 fL (ref 80.0–100.0)
MCV: 96.9 fL (ref 80.0–100.0)
Platelets: 215 10*3/uL (ref 150–400)
Platelets: 180 10*3/uL (ref 150–400)
RBC: 3.69 MIL/uL — ABNORMAL LOW (ref 3.87–5.11)
RBC: 3.59 MIL/uL — ABNORMAL LOW (ref 3.87–5.11)
RDW: 14.8 % (ref 11.5–15.5)
RDW: 14.8 % (ref 11.5–15.5)
WBC: 7 10*3/uL (ref 4.0–10.5)
WBC: 7.5 10*3/uL (ref 4.0–10.5)
nRBC: 0 % (ref 0.0–0.2)
nRBC: 0 % (ref 0.0–0.2)

## 2022-07-17 LAB — COMPREHENSIVE METABOLIC PANEL
ALT: 33 U/L (ref 0–44)
AST: 28 U/L (ref 15–41)
Albumin: 3.8 g/dL (ref 3.5–5.0)
Alkaline Phosphatase: 49 U/L (ref 38–126)
Anion gap: 6 (ref 5–15)
BUN: 22 mg/dL (ref 8–23)
CO2: 22 mmol/L (ref 22–32)
Calcium: 8.6 mg/dL — ABNORMAL LOW (ref 8.9–10.3)
Chloride: 103 mmol/L (ref 98–111)
Creatinine, Ser: 1.52 mg/dL — ABNORMAL HIGH (ref 0.44–1.00)
GFR, Estimated: 34 mL/min — ABNORMAL LOW (ref >60)
Glucose, Bld: 112 mg/dL — ABNORMAL HIGH (ref 70–99)
Potassium: 5.2 mmol/L — ABNORMAL HIGH (ref 3.5–5.1)
Sodium: 131 mmol/L — ABNORMAL LOW (ref 135–145)
Total Bilirubin: 0.3 mg/dL (ref 0.3–1.2)
Total Protein: 6.5 g/dL (ref 6.5–8.1)

## 2022-07-17 LAB — BASIC METABOLIC PANEL
Anion gap: 9 (ref 5–15)
BUN: 14 mg/dL (ref 8–23)
CO2: 23 mmol/L (ref 22–32)
Calcium: 8.7 mg/dL — ABNORMAL LOW (ref 8.9–10.3)
Chloride: 103 mmol/L (ref 98–111)
Creatinine, Ser: 1.3 mg/dL — ABNORMAL HIGH (ref 0.44–1.00)
GFR, Estimated: 41 mL/min — ABNORMAL LOW (ref >60)
Glucose, Bld: 116 mg/dL — ABNORMAL HIGH (ref 70–99)
Potassium: 5.9 mmol/L — ABNORMAL HIGH (ref 3.5–5.1)
Sodium: 135 mmol/L (ref 135–145)

## 2022-07-17 MED ORDER — SORBITOL 70 % SOLN
960.0000 mL | TOPICAL_OIL | Freq: Once | ORAL | Status: DC
  Filled 2022-07-17 (×2): qty 240

## 2022-07-17 MED ORDER — MAGNESIUM CITRATE PO SOLN
1.0000 | Freq: Once | ORAL | Status: AC
  Administered 2022-07-17: 1 via ORAL
  Filled 2022-07-17: qty 296

## 2022-07-17 MED ORDER — ENOXAPARIN SODIUM 40 MG/0.4ML IJ SOSY: 40.0000 mg | PREFILLED_SYRINGE | INTRAMUSCULAR | Status: DC

## 2022-07-17 MED ORDER — LACTULOSE ENEMA
300.0000 mL | Freq: Every day | ORAL | Status: DC
  Administered 2022-07-17: 300 mL via RECTAL
  Filled 2022-07-17: qty 300

## 2022-07-17 MED ORDER — ACETAMINOPHEN 325 MG PO TABS
650.0000 mg | ORAL_TABLET | Freq: Four times a day (QID) | ORAL | Status: DC | PRN
  Administered 2022-07-19 (×2): 650 mg via ORAL
  Filled 2022-07-17 (×2): qty 2

## 2022-07-17 MED ORDER — ACETAMINOPHEN 650 MG RE SUPP: 650.0000 mg | Freq: Four times a day (QID) | RECTAL | Status: DC | PRN

## 2022-07-17 MED ORDER — ALUM & MAG HYDROXIDE-SIMETH 200-200-20 MG/5ML PO SUSP
30.0000 mL | Freq: Once | ORAL | Status: AC
  Administered 2022-07-17: 30 mL via ORAL
  Filled 2022-07-17: qty 30

## 2022-07-17 MED ORDER — SODIUM CHLORIDE 0.9% FLUSH
3.0000 mL | Freq: Two times a day (BID) | INTRAVENOUS | Status: DC
  Administered 2022-07-18 – 2022-07-20 (×6): 3 mL via INTRAVENOUS

## 2022-07-17 MED ORDER — SENNOSIDES-DOCUSATE SODIUM 8.6-50 MG PO TABS
1.0000 | ORAL_TABLET | Freq: Two times a day (BID) | ORAL | Status: DC
  Administered 2022-07-18 – 2022-07-20 (×6): 1 via ORAL
  Filled 2022-07-17 (×6): qty 1

## 2022-07-17 MED ORDER — ENOXAPARIN SODIUM 30 MG/0.3ML IJ SOSY
30.0000 mg | PREFILLED_SYRINGE | INTRAMUSCULAR | Status: DC
  Administered 2022-07-18 – 2022-07-19 (×2): 30 mg via SUBCUTANEOUS
  Filled 2022-07-17 (×2): qty 0.3

## 2022-07-17 MED ORDER — ACETAMINOPHEN 325 MG PO TABS
650.0000 mg | ORAL_TABLET | Freq: Once | ORAL | Status: AC
  Administered 2022-07-17: 650 mg via ORAL
  Filled 2022-07-17: qty 2

## 2022-07-17 MED ORDER — SODIUM CHLORIDE 0.9 % IV BOLUS
500.0000 mL | Freq: Once | INTRAVENOUS | Status: AC
  Administered 2022-07-17: 500 mL via INTRAVENOUS

## 2022-07-17 MED ORDER — IOHEXOL 300 MG/ML  SOLN: 100.0000 mL | Freq: Once | INTRAMUSCULAR | Status: DC | PRN

## 2022-07-17 MED ORDER — PEG 3350-KCL-NA BICARB-NACL 420 G PO SOLR
4000.0000 mL | Freq: Once | ORAL | Status: AC
  Administered 2022-07-17: 4000 mL via ORAL
  Filled 2022-07-17: qty 4000

## 2022-07-17 MED ORDER — DEXTROSE-NACL 5-0.45 % IV SOLN: INTRAVENOUS | Status: DC

## 2022-07-17 NOTE — ED Triage Notes (Signed)
Pt is here for continued constipation.  Pt was seen 3/24 for same and admitted to the hospital where she had an enema which produced a small BM and was discharged Monday 3/25.  Since dc pt has been taking prune juice, 4 scoops of miralax twice a day with 20oz of gatorade, liquid dulcolax, magnesium citrate and she has done 2 enemas at home and a glycerine suppository.  Pt has only had 3 pellets of stool since dc, this was last night.  Pt has a distended abdomen and reports nausea and belching and abdominal pain.

## 2022-07-17 NOTE — ED Notes (Signed)
ED TO INPATIENT HANDOFF REPORT  ED Nurse Name and Phone #: Orpah Cobb Name/Age/Gender Amy Hickman 87 y.o. female Room/Bed: MH01/MH01  Code Status   Code Status: Prior  Home/SNF/Other Home Patient oriented to: self, place, time, and situation Is this baseline? Yes   Triage Complete: Triage complete  Chief Complaint AKI (acute kidney injury) [N17.9]  Triage Note Pt is here for continued constipation.  Pt was seen 3/24 for same and admitted to the hospital where she had an enema which produced a small BM and was discharged Monday 3/25.  Since dc pt has been taking prune juice, 4 scoops of miralax twice a day with 20oz of gatorade, liquid dulcolax, magnesium citrate and she has done 2 enemas at home and a glycerine suppository.  Pt has only had 3 pellets of stool since dc, this was last night.  Pt has a distended abdomen and reports nausea and belching and abdominal pain.     Allergies Allergies  Allergen Reactions   Cephalexin Other (See Comments)    Abdominal pain , GI distress, Abdominal pain , GI distress   Hydrocodone-Acetaminophen Other (See Comments) and Rash    Palpitations, flushing, hyperactivity, Chest pain, Chest pain   Ativan [Lorazepam] Other (See Comments)    "Knocked her out, woke up 3 days later"   Augmentin [Amoxicillin-Pot Clavulanate] Nausea And Vomiting   Avelox [Moxifloxacin] Itching and Nausea Only   Brimonidine Itching and Swelling    redness   Chlorzoxazone Other (See Comments)    Went crazy/loopy   Ciprofloxacin Hcl Itching and Nausea Only   Clavulanic Acid Other (See Comments)     Unknown   Crestor [Rosuvastatin] Other (See Comments)    myalgia   Ezetimibe Diarrhea    Stomach cramps   Gluten Meal Other (See Comments)    Diarrhea, hot/cold sweat, will sleep for a couple of hours - Celiac disease   Lansoprazole Other (See Comments)    GI intolerance   Nitrofuran Derivatives Itching and Nausea Only   Phenergan [Promethazine Hcl] Other (See  Comments)    hallucinations   Wheat Other (See Comments)    Gi distress- celiac disease   Codeine Palpitations   Darvon [Propoxyphene Hcl] Palpitations   Doxycycline Itching and Nausea Only    Level of Care/Admitting Diagnosis ED Disposition     ED Disposition  Admit   Condition  --   [place]: [place] [[id number]]  Level of Care: Telemetry Medical [104]  May admit patient to [facility] or [facility] if equivalent level of care is available:: No  Interfacility transfer: Yes  Covid Evaluation: Confirmed COVID Negative  Diagnosis: AKI (acute kidney injuryFG:[id number]  Admitting Physician: Florencia Reasons [id number]  Attending Physician: Florencia Reasons [id number]  Certification:: I certify this patient will need inpatient services for at least 2 midnights  Estimated Length of Stay: 2          B Medical/Surgery History Past Medical History:  Diagnosis Date   Atrial fibrillation, chronic ([place])    s/p multiple cardioversions with sustained NSR, on Eliquis   Celiac disease    Chronic systolic CHF (congestive heart failure) (HCC)    Coronary artery disease involving native coronary artery of native heart without angina pectoris 03/15/2021   GERD (gastroesophageal reflux disease)    HOH (hard of hearing)    Hyperparathyroidism ([place]) 12/12/2020   Hypertension    Interstitial cystitis    Past Surgical History:  Procedure Laterality Date  ABDOMINAL HYSTERECTOMY     APPENDECTOMY     CARDIOVERSION N/A 04/24/2021   Procedure: CARDIOVERSION;  Surgeon: Jerline Pain, MD;  Location: [facility] ENDOSCOPY;  Service: Cardiovascular;  Laterality: N/A;   CARDIOVERSION N/A 06/12/2021   Procedure: CARDIOVERSION;  Surgeon: Buford Dresser, MD;  Location: [place];  Service: Cardiovascular;  Laterality: N/A;   CARDIOVERSION N/A 08/11/2021   Procedure: CARDIOVERSION;  Surgeon: [name], MD;  Location: [facility] ENDOSCOPY;  Service: Cardiovascular;  Laterality: N/A;    CHOLECYSTECTOMY     ear drum surgery     REPLACEMENT TOTAL KNEE     ROTATOR CUFF REPAIR Right    TONSILLECTOMY       A IV Location/Drains/Wounds Patient Lines/Drains/Airways Status     Active Line/Drains/Airways     Name Placement date Placement time Site Days   Peripheral IV 07/17/22 22 G Anterior;Proximal;Right Forearm 07/17/22  0943  Forearm  less than 1            Intake/Output Last 24 hours  Intake/Output Summary (Last 24 hours) at 07/17/2022 1632 Last data filed at 07/17/2022 1147 Gross per 24 hour  Intake 500 ml  Output --  Net 500 ml    Labs/Imaging Results for orders placed or performed during the hospital encounter of 07/17/22 (from the past 48 hour(s))  CBC     Status: Abnormal   Collection Time: 07/17/22  9:38 AM  Result Value Ref Range   WBC 7.0 4.0 - 10.5 K/uL   RBC 3.69 (L) 3.87 - 5.11 MIL/uL   Hemoglobin 11.4 (L) 12.0 - 15.0 g/dL   HCT 35.8 (L) 36.0 - 46.0 %   MCV 97.0 80.0 - 100.0 fL   MCH 30.9 26.0 - 34.0 pg   MCHC 31.8 30.0 - 36.0 g/dL   RDW 14.8 11.5 - 15.5 %   Platelets 215 150 - 400 K/uL   nRBC 0.0 0.0 - 0.2 %    Comment: Performed at [facility], [address] [place], [place] [address]  Comprehensive metabolic panel     Status: Abnormal   Collection Time: 07/17/22  9:38 AM  Result Value Ref Range   Sodium 131 (L) 135 - 145 mmol/L   Potassium 5.2 (H) 3.5 - 5.1 mmol/L   Chloride 103 98 - 111 mmol/L   CO2 22 22 - 32 mmol/L   Glucose, Bld 112 (H) 70 - 99 mg/dL    Comment: Glucose reference range applies only to samples taken after fasting for at least 8 hours.   BUN 22 8 - 23 mg/dL   Creatinine, Ser 1.52 (H) 0.44 - 1.00 mg/dL   Calcium 8.6 (L) 8.9 - 10.3 mg/dL   Total Protein 6.5 6.5 - 8.1 g/dL   Albumin 3.8 3.5 - 5.0 g/dL   AST 28 15 - 41 U/L   ALT 33 0 - 44 U/L   Alkaline Phosphatase 49 38 - 126 U/L   Total Bilirubin 0.3 0.3 - 1.2 mg/dL   GFR, Estimated 34 (L) >60 mL/min    Comment: (NOTE) Calculated using the  CKD-EPI Creatinine Equation (2021)    Anion gap 6 5 - 15    Comment: Performed at [facility], [address] [place], [place] [address]   CT ABDOMEN PELVIS WO CONTRAST  Result Date: 07/17/2022 CLINICAL DATA:  Constipation.  Abdominal pain. EXAM: CT ABDOMEN AND PELVIS WITHOUT CONTRAST TECHNIQUE: Multidetector CT imaging of the abdomen and pelvis was performed following the standard protocol  without IV contrast. RADIATION DOSE REDUCTION: This exam was performed according to the departmental dose-optimization program which includes automated exposure control, adjustment of the mA and/or kV according to patient size and/or use of iterative reconstruction technique. COMPARISON:  CT 07/12/2022 and older FINDINGS: Lower chest: Heart is enlarged. Coronary artery calcifications are seen. Mild atelectasis at the lung bases. No pleural effusion. Hepatobiliary: Previous cholecystectomy. On this non IV contrast exam, grossly the liver is preserved. Pancreas: Mild atrophy of the pancreas.  No obvious pancreatic mass. Spleen: Normal in size without focal abnormality. Adrenals/Urinary Tract: Adrenal glands are preserved. No abnormal calcifications are seen within either kidney nor along the course of either ureter. Preserved contours of the urinary bladder. There is a low-lying urinary bladder. Please correlate with prolapse. Stomach/Bowel: Once again there is a very large amount of diffuse colonic stool. Several loops of colon are ectatic as well measuring up to 8.5 cm. There is moderate debris in the stomach. Small bowel is nondilated. Surgical changes identified along loops of small bowel in the central pelvis above the bladder. Vascular/Lymphatic: Diffuse vascular calcifications are identified along the aorta and branch vessels. There an IVC filter in place once again. The IVC is small in caliber at the level of the tip of the filter. No obvious abnormal lymph node enlargement seen on this noncontrast  examination. Reproductive: Status post hysterectomy. No adnexal masses. Ovaries are poorly seen. Other: Mild anasarca.  No free air or free fluid Musculoskeletal: Curvature of the spine with degenerative changes. Degenerative changes seen of the hip joints as well. Osteopenia. IMPRESSION: Once again as on prior there is a large amount of diffuse colonic stool. No secondary changes of obstruction. Some of the loops of colon are ectatic measuring up to 8.5 cm. No free air or free fluid clearly seen. No obstructing renal stones. Low-lying urinary bladder.  Please correlate with pelvic prolapse. IVC filter. Enlarged heart Electronically Signed   By: Jill Side M.D.   On: 07/17/2022 11:14    Pending Labs Unresulted Labs (From admission, onward)    None       Vitals/Pain Today's Vitals   07/17/22 1342 07/17/22 1345 07/17/22 1430 07/17/22 1600  BP: (!) 116/51 (!) 122/47 (!) 121/56 (!) 136/58  Pulse: 61 (!) 59 (!) 56 (!) 53  Resp: 18 16 16    Temp:   (!) 97.4 F (36.3 C)   TempSrc:   Oral   SpO2: 99% 99% 98% 97%  PainSc:   8      Isolation Precautions No active isolations  Medications Medications  sorbitol, milk of mag, mineral oil, glycerin (SMOG) enema (960 mLs Rectal Not Given 07/17/22 1259)  sodium chloride 0.9 % bolus 500 mL (0 mLs Intravenous Stopped 07/17/22 1147)  magnesium citrate solution 1 Bottle (1 Bottle Oral Given 07/17/22 1259)  alum & mag hydroxide-simeth (MAALOX/MYLANTA) 200-200-20 MG/5ML suspension 30 mL (30 mLs Oral Given 07/17/22 1606)  sodium chloride 0.9 % bolus 500 mL (500 mLs Intravenous New Bag/Given 07/17/22 1610)  acetaminophen (TYLENOL) tablet 650 mg (650 mg Oral Given 07/17/22 1625)    Mobility walks     Focused Assessments Pt is here for profound constipation (see CT).  Pt has distended abdomen and discomfort, belching and nausea. She states that it has been "weeks" since she had a proper BM.  She was recently hospitalized and reprots that she did not have  real BM during hospitalization but had some liquid return from half of soap suds that was given.  Since d/c only 3 small pellets.  2 soap suds enemas given  R Recommendations: See Admitting Provider Note  Report given to:   Additional Notes:

## 2022-07-17 NOTE — H&P (Signed)
History and Physical    Patient: Amy Hickman [id number] DOB: 1935-12-10 DOA: 07/17/2022 DOS: the patient was seen and examined on 07/17/2022 PCP: [facility]  Patient coming from: Home  Chief Complaint:  Chief Complaint  Patient presents with   Constipation   HPI: Amy Hickman is a 87 y.o. female with medical history significant of ?intussusception as an 65 yo child that was treated with laparotomy. Patient has since sufered, based on her description, other episodes of bowel obstruction that have requireed her to drink barium etc. Patient was in her usual state of health til about 3 weeks ago when she reports a new onset of constipation. She reports not passing gas since, abd distention and having maybe 3 small soft formed BM in total inspite of multiple laxatives/enema ingestion. She has poor apetite, but tolerated drinks. No vomting, positvie belching. No fever. No trauama , no back pain (that is acute, she has mild chronic back pain) . No trouble urinating.  She has generalized abdominal discomfort crampy  [place] was actually hospitalized for above symptoms and released just 4 days ago after tiral of laxatives. Hwoever, she described pretty much unremitting symptoms and returned via ER [facility]..    Review of Systems: As mentioned in the history of present illness. All other systems reviewed and are negative. Past Medical History:  Diagnosis Date   Atrial fibrillation, chronic (HCC)    s/p multiple cardioversions with sustained NSR, on Eliquis   Celiac disease    Chronic systolic CHF (congestive heart failure) (HCC)    Coronary artery disease involving native coronary artery of native heart without angina pectoris 03/15/2021   GERD (gastroesophageal reflux disease)    HOH (hard of hearing)    Hyperparathyroidism ([place]) 12/12/2020   Hypertension    Interstitial cystitis    Past Surgical History:  Procedure  Laterality Date   ABDOMINAL HYSTERECTOMY     APPENDECTOMY     CARDIOVERSION N/A 04/24/2021   Procedure: CARDIOVERSION;  Surgeon: Jerline Pain, MD;  Location: [facility] ENDOSCOPY;  Service: Cardiovascular;  Laterality: N/A;   CARDIOVERSION N/A 06/12/2021   Procedure: CARDIOVERSION;  Surgeon: Buford Dresser, MD;  Location: [place];  Service: Cardiovascular;  Laterality: N/A;   CARDIOVERSION N/A 08/11/2021   Procedure: CARDIOVERSION;  Surgeon: [name], MD;  Location: [facility] ENDOSCOPY;  Service: Cardiovascular;  Laterality: N/A;   CHOLECYSTECTOMY     ear drum surgery     REPLACEMENT TOTAL KNEE     ROTATOR CUFF REPAIR Right    TONSILLECTOMY     Social History:  reports that she quit smoking about 43 years ago. Her smoking use included cigarettes. She has a 66.00 pack-year smoking history. She has never used smokeless tobacco. She reports that she does not currently use alcohol. She reports that she does not use drugs.  Allergies  Allergen Reactions   Cephalexin Other (See Comments)    Abdominal pain , GI distress, Abdominal pain , GI distress   Hydrocodone-Acetaminophen Other (See Comments) and Rash    Palpitations, flushing, hyperactivity, Chest pain, Chest pain   Ativan [Lorazepam] Other (See Comments)    "Knocked her out, woke up 3 days later"   Augmentin [Amoxicillin-Pot Clavulanate] Nausea And Vomiting   Avelox [Moxifloxacin] Itching and Nausea Only   Brimonidine Itching and Swelling    redness   Chlorzoxazone Other (See Comments)    Went crazy/loopy   Ciprofloxacin Hcl Itching and Nausea Only  Clavulanic Acid Other (See Comments)     Unknown   Crestor [Rosuvastatin] Other (See Comments)    myalgia   Ezetimibe Diarrhea    Stomach cramps   Gluten Meal Other (See Comments)    Diarrhea, hot/cold sweat, will sleep for a couple of hours - Celiac disease   Lansoprazole Other (See Comments)    GI intolerance   Nitrofuran Derivatives Itching and Nausea Only    Phenergan [Promethazine Hcl] Other (See Comments)    hallucinations   Wheat Other (See Comments)    Gi distress- celiac disease   Codeine Palpitations   Darvon [Propoxyphene Hcl] Palpitations   Doxycycline Itching and Nausea Only    Family History  Problem Relation Age of Onset   Hypertension Other     Prior to Admission medications   Medication Sig Start Date End Date Taking? Authorizing Provider  acetaminophen (TYLENOL) 500 MG tablet Take 1 tablet (500 mg total) by mouth every 6 (six) hours as needed for pain. Patient taking differently: Take 1,000 mg by mouth every 6 (six) hours as needed for pain or moderate pain. 06/24/12   [name], MD  amiodarone (PACERONE) 200 MG tablet Take 1 tablet (200 mg total) by mouth daily. 03/05/22   Sherran Needs, NP  apixaban (ELIQUIS) 5 MG TABS tablet Take 1 tablet (5 mg total) by mouth 2 (two) times daily. 06/23/22   Buford Dresser, MD  B Complex Vitamins (VITAMIN B COMPLEX) TABS Take 1 tablet by mouth daily.    [provider]  carvedilol (COREG) 12.5 MG tablet Take 1 tablet (12.5 mg total) by mouth 2 (two) times daily. Replaces metoprolol 05/29/22 06/28/22  Buford Dresser, MD  clidinium-chlordiazePOXIDE (LIBRAX) 5-2.5 MG capsule Take 2 capsules by mouth daily as needed (cramping). 02/28/21   [provider]  CRANBERRY-VITAMIN C PO Take 1 capsule by mouth in the morning.    [provider]  diltiazem (CARDIZEM CD) 360 MG 24 hr capsule Take 1 capsule (360 mg total) by mouth daily. 06/12/21   Buford Dresser, MD  esomeprazole (NEXIUM) 40 MG capsule Take 40 mg by mouth daily before breakfast.    [provider]  hydrALAZINE (APRESOLINE) 50 MG tablet Take 2 tablets (100 mg total) by mouth 3 (three) times daily. 06/23/22   Buford Dresser, MD  isosorbide dinitrate (ISORDIL) 10 MG tablet Take 10 mg by mouth 2 (two) times daily. 12/31/21   [provider]  levothyroxine (SYNTHROID)  100 MCG tablet Take 100 mcg by mouth daily before breakfast. 02/09/22   [provider]  Netarsudil-Latanoprost (ROCKLATAN) 0.02-0.005 % SOLN Place 1 drop into both eyes at bedtime. 12/30/21   [provider]  polyethylene glycol (MIRALAX / GLYCOLAX) 17 g packet Take 17 g by mouth 2 (two) times daily. 07/13/22   Danford, Suann Larry, MD  polyvinyl alcohol (ARTIFICIAL TEARS) 1.4 % ophthalmic solution Place 1 drop into both eyes as needed for dry eyes.    [provider]  senna-docusate (SENOKOT-S) 8.6-50 MG tablet Take 2 tablets by mouth 2 (two) times daily. 03/22/22   Hosie Poisson, MD  Vitamin D, Ergocalciferol, (DRISDOL) 1.25 MG (50000 UNIT) CAPS capsule Take 50,000 Units by mouth every 7 (seven) days. 05/22/21   [provider]  Home med collection is still pending.  Physical Exam: Vitals:   07/17/22 1700 07/17/22 1810 07/17/22 1826 07/17/22 2000  BP: 139/63 (!) 159/64  (!) 144/68  Pulse: (!) 54 61  (!) 59  Resp:  17  18  Temp:  (!) 97.4 F (36.3 C)  97.6 F (36.4 C)  TempSrc:  Oral  Oral  SpO2: 99%   98%  Weight:   71 kg   Height:   5' (1.524 m)    Alert awake oriented x 3 no immediate distress apparent on encountering. Respiratory exam: Bilateral air entry vesicular Cardiovascular exam S1-S2 normal Abdomen is distended Bowel sounds are positive there is tenderness in all quadrants without guarding or rebound. Rectal exam was done with patient's RN Leane Call present at all times.  Sensation is preserved and there is good tone of the sphincter, wall has mild stool as is evident by smearing on the finger.  However no fecolith was palpated Extremities: Warm without edema Neurologic exam no focal motor deficit Data Reviewed:  Labs on Admission:  Results for orders placed or performed during the hospital encounter of 07/17/22 (from the past 24 hour(s))  CBC     Status: Abnormal   Collection Time: 07/17/22  9:38 AM  Result Value Ref Range    WBC 7.0 4.0 - 10.5 K/uL   RBC 3.69 (L) 3.87 - 5.11 MIL/uL   Hemoglobin 11.4 (L) 12.0 - 15.0 g/dL   HCT 35.8 (L) 36.0 - 46.0 %   MCV 97.0 80.0 - 100.0 fL   MCH 30.9 26.0 - 34.0 pg   MCHC 31.8 30.0 - 36.0 g/dL   RDW 14.8 11.5 - 15.5 %   Platelets 215 150 - 400 K/uL   nRBC 0.0 0.0 - 0.2 %  Comprehensive metabolic panel     Status: Abnormal   Collection Time: 07/17/22  9:38 AM  Result Value Ref Range   Sodium 131 (L) 135 - 145 mmol/L   Potassium 5.2 (H) 3.5 - 5.1 mmol/L   Chloride 103 98 - 111 mmol/L   CO2 22 22 - 32 mmol/L   Glucose, Bld 112 (H) 70 - 99 mg/dL   BUN 22 8 - 23 mg/dL   Creatinine, Ser 1.52 (H) 0.44 - 1.00 mg/dL   Calcium 8.6 (L) 8.9 - 10.3 mg/dL   Total Protein 6.5 6.5 - 8.1 g/dL   Albumin 3.8 3.5 - 5.0 g/dL   AST 28 15 - 41 U/L   ALT 33 0 - 44 U/L   Alkaline Phosphatase 49 38 - 126 U/L   Total Bilirubin 0.3 0.3 - 1.2 mg/dL   GFR, Estimated 34 (L) >60 mL/min   Anion gap 6 5 - 15   Radiological Exams on Admission:  CT ABDOMEN PELVIS WO CONTRAST  Result Date: 07/17/2022 CLINICAL DATA:  Constipation.  Abdominal pain. EXAM: CT ABDOMEN AND PELVIS WITHOUT CONTRAST TECHNIQUE: Multidetector CT imaging of the abdomen and pelvis was performed following the standard protocol without IV contrast. RADIATION DOSE REDUCTION: This exam was performed according to the departmental dose-optimization program which includes automated exposure control, adjustment of the mA and/or kV according to patient size and/or use of iterative reconstruction technique. COMPARISON:  CT 07/12/2022 and older FINDINGS: Lower chest: Heart is enlarged. Coronary artery calcifications are seen. Mild atelectasis at the lung bases. No pleural effusion. Hepatobiliary: Previous cholecystectomy. On this non IV contrast exam, grossly the liver is preserved. Pancreas: Mild atrophy of the pancreas.  No obvious pancreatic mass. Spleen: Normal in size without focal abnormality. Adrenals/Urinary Tract: Adrenal glands are  preserved. No abnormal calcifications are seen within either kidney nor along the course of either ureter. Preserved contours of the urinary bladder. There is a low-lying urinary bladder. Please correlate with prolapse.  Stomach/Bowel: Once again there is a very large amount of diffuse colonic stool. Several loops of colon are ectatic as well measuring up to 8.5 cm. There is moderate debris in the stomach. Small bowel is nondilated. Surgical changes identified along loops of small bowel in the central pelvis above the bladder. Vascular/Lymphatic: Diffuse vascular calcifications are identified along the aorta and branch vessels. There an IVC filter in place once again. The IVC is small in caliber at the level of the tip of the filter. No obvious abnormal lymph node enlargement seen on this noncontrast examination. Reproductive: Status post hysterectomy. No adnexal masses. Ovaries are poorly seen. Other: Mild anasarca.  No free air or free fluid Musculoskeletal: Curvature of the spine with degenerative changes. Degenerative changes seen of the hip joints as well. Osteopenia. IMPRESSION: Once again as on prior there is a large amount of diffuse colonic stool. No secondary changes of obstruction. Some of the loops of colon are ectatic measuring up to 8.5 cm. No free air or free fluid clearly seen. No obstructing renal stones. Low-lying urinary bladder.  Please correlate with pelvic prolapse. IVC filter. Enlarged heart Electronically Signed   By: Jill Side M.D.   On: 07/17/2022 11:14    EKG: Independently reviewed.  EKG ordered   Assessment and Plan: * AKI (acute kidney injury) ([place]) Will check urinalysis, creatinine and sodium.  Overall impression at this time is that this is likely prerenal from patient's reduced p.o. intake.  I will treat the patient with IV fluids and see how he responds  Hyperkalemia Due to above, patient has received IV hydration in outside facility.  Recheck BMP now.  Continue with IV  hydration, continue with telemetry for now.  Constipation At this time patient seems to have failed outpatient trial of laxatives.  I have no objection with continuing trial of laxatives including GoLytely and enema.  However we have to consider secondary causes of constipation at this time.  And therefore do favor having GI eval for possible colonoscopy to rule out occult stricture/stenosis/obstruction in the colon. Check TSH.   Atrial flutter ([place]) Holding eliquis      Advance Care Planning:   Code Status: Prior patient describes complaining a DNR in the past, however then states that she should be resuscitated if there is any chance of her being resuscitated.  We have no documentation here in the electronic medical record of patient completing such form.  At this time I will document patient is full code and request a palliative care evaluation for further clarification of CODE STATUS.  Consults: Gastroenterology was consulted prior to acceptance of the case.  I have sent a staff message to Dr. Hilarie Fredrickson to consult.  Family Communication: per patient.  Severity of Illness: The appropriate patient status for this patient is INPATIENT. Inpatient status is judged to be reasonable and necessary in order to provide the required intensity of service to ensure the patient's safety. The patient's presenting symptoms, physical exam findings, and initial radiographic and laboratory data in the context of their chronic comorbidities is felt to place them at high risk for further clinical deterioration. Furthermore, it is not anticipated that the patient will be medically stable for discharge from the hospital within 2 midnights of admission.   * I certify that at the point of admission it is my clinical judgment that the patient will require inpatient hospital care spanning beyond 2 midnights from the point of admission due to high intensity of service, high risk for  further deterioration and high frequency  of surveillance required.*  Author: Gertie Fey, MD 07/17/2022 8:02 PM  For on call review [email or web address].

## 2022-07-17 NOTE — ED Notes (Signed)
Slowly instilled warm soap suds enema, able to instill whole liter, pt attempting to hold it in after instillation but some clear liquid is returning.  Pt has some continued belching and is reporting heart burn after magnesium citrate.

## 2022-07-17 NOTE — ED Notes (Signed)
Patient transported to CT 

## 2022-07-17 NOTE — Progress Notes (Addendum)
RN received report and assumed care for PT. MD is at bedside to evaluate. Rectal exam completed with RN. Pt continues to complain of RUQ pain plus abdominal distention plus constipation. RN will continue to assess and follow MD orders. Pt is alert and oriented times 4. Resp even and unlabored. Pale color. Vitals updated in [facility].   2045 assisted pt to [facility] for 1 large BM soft blobs with loose stool. Pt passing gas, urinating without issues. Pt states she feels a little better. NPO status explained. Ice chips provided.   2055 pt assisted to the bathroom but only positive for 1 count yellow clear urine plus gas. RN will continue to monitor.   2200 transported to X Ray  2236 Pt back from Xray  Pt c/o chest pain, EKG performed. Provider contacted.  Pt is asleep. Sinus Brady 55 noted on the monitor. Pt easily aroused. Chest pain has improved. Pt had a total of 3 large BM since 1930. Last stool was liquid with small amount of soft blobs without stool. Pt had 3 large BMs. She is drinking golytely as she can tolerate it. Abd slightly less distended but still harder in RUQ with some tenderness. Sent pharmacy a message to send the SMOG to be given as it arrives. MD is aware.

## 2022-07-17 NOTE — Assessment & Plan Note (Signed)
Will check urinalysis, creatinine and sodium.  Overall impression at this time is that this is likely prerenal from patient's reduced p.o. intake.  I will treat the patient with IV fluids and see how he responds

## 2022-07-17 NOTE — ED Notes (Signed)
Pt had results from enema, liquid.  About 1L+, no formed stool.  Pt cleaned up (soapy water and peri cleanser and linen and gown changed and helped back to stretcher.  Pt requests depend due to some incontinence in bed before transferring to [facility] after second enema. Pt given warm tea (black and lemon) to drink.  Family attentive at the bedside

## 2022-07-17 NOTE — Assessment & Plan Note (Signed)
Due to above, patient has received IV hydration in outside facility.  Recheck BMP now.  Continue with IV hydration, continue with telemetry for now.

## 2022-07-17 NOTE — Progress Notes (Signed)
Plan of Care Note for accepted transfer   Patient: Amy Hickman MRN: [id number]   DOA: 07/17/2022  Facility requesting transfer: [facility] Requesting Provider: EDP Reason for transfer: AKI, severe constipation, colonic distension  Facility course:  Blood pressure (!) 121/56, pulse (!) 56, temperature (!) 97.4 F (36.3 C), temperature source Oral, resp. rate 16, SpO2 98 %.  Ms Kikuye, Midgette was in the hospital from 3/24 to 3/25 for constipation, discharged home on stool softener, but returned with ab distension, nausea , found to have AKI, ( cr 1.52, k, 5,2, ns 131)  ct showed constipation, colon distension at 8.5cm, EDP talked to GI Dr Hilarie Fredrickson who will see patient once arrives, please inform GI once pt arrives. Requested EDP to give hydration.  Plan of care: The patient is accepted for admission to Telemetry unit, at [facility]..    Author: Florencia Reasons, MD PhD FACP 07/17/2022  Check [email or web address] for on-call coverage.  Nursing staff, Please call [place] number on Amion as soon as patient's arrival, so appropriate admitting provider can evaluate the pt.

## 2022-07-17 NOTE — ED Provider Notes (Signed)
[place] EMERGENCY DEPARTMENT AT [facility] [place] Provider Note   CSN: [id number] Arrival date & time: 07/17/22  [id number]     History  Chief Complaint  Patient presents with   Constipation    Amy Hickman is a 87 y.o. female.  Patient with history of CHF, CKD, afib on Elliquis, Celiac disease, GERD presents today with complaints of constipation. She states that same has been ongoing for about 3 weeks. She was just admitted for same on 3/24 and received Miralax, Senna, Doculax, and an enema and states that she had one small bowel movement and was discharged on 3/25 with a bowel regimen. Since her discharge she has been taking prune juice, 4 scoops Miralax twice a day, Doculax, magnesium citrate, 2 enemas, and a glycerin suppository and has one had 2 small 'pellets' of stool since discharge. She states that her abdominal distention had gotten worse and she is having increased nausea and belching. Denies vomiting, states she has been taking Zofran regularly for her nausea. Endorses decreased po intake.   The history is provided by the patient. No language interpreter was used.  Constipation      Home Medications Prior to Admission medications   Medication Sig Start Date End Date Taking? Authorizing Provider  acetaminophen (TYLENOL) 500 MG tablet Take 1 tablet (500 mg total) by mouth every 6 (six) hours as needed for pain. Patient taking differently: Take 1,000 mg by mouth every 6 (six) hours as needed for pain or moderate pain. 06/24/12   [name], MD  amiodarone (PACERONE) 200 MG tablet Take 1 tablet (200 mg total) by mouth daily. 03/05/22   Sherran Needs, NP  apixaban (ELIQUIS) 5 MG TABS tablet Take 1 tablet (5 mg total) by mouth 2 (two) times daily. 06/23/22   Buford Dresser, MD  B Complex Vitamins (VITAMIN B COMPLEX) TABS Take 1 tablet by mouth daily.    [provider]  carvedilol (COREG) 12.5 MG tablet Take 1 tablet (12.5 mg total) by mouth 2 (two)  times daily. Replaces metoprolol 05/29/22 06/28/22  Buford Dresser, MD  clidinium-chlordiazePOXIDE (LIBRAX) 5-2.5 MG capsule Take 2 capsules by mouth daily as needed (cramping). 02/28/21   [provider]  CRANBERRY-VITAMIN C PO Take 1 capsule by mouth in the morning.    [provider]  diltiazem (CARDIZEM CD) 360 MG 24 hr capsule Take 1 capsule (360 mg total) by mouth daily. 06/12/21   Buford Dresser, MD  esomeprazole (NEXIUM) 40 MG capsule Take 40 mg by mouth daily before breakfast.    [provider]  hydrALAZINE (APRESOLINE) 50 MG tablet Take 2 tablets (100 mg total) by mouth 3 (three) times daily. 06/23/22   Buford Dresser, MD  isosorbide dinitrate (ISORDIL) 10 MG tablet Take 10 mg by mouth 2 (two) times daily. 12/31/21   [provider]  levothyroxine (SYNTHROID) 100 MCG tablet Take 100 mcg by mouth daily before breakfast. 02/09/22   [provider]  Netarsudil-Latanoprost (ROCKLATAN) 0.02-0.005 % SOLN Place 1 drop into both eyes at bedtime. 12/30/21   [provider]  polyethylene glycol (MIRALAX / GLYCOLAX) 17 g packet Take 17 g by mouth 2 (two) times daily. 07/13/22   Danford, Suann Larry, MD  polyvinyl alcohol (ARTIFICIAL TEARS) 1.4 % ophthalmic solution Place 1 drop into both eyes as needed for dry eyes.    [provider]  senna-docusate (SENOKOT-S) 8.6-50 MG tablet Take 2 tablets by mouth 2 (two) times daily. 03/22/22   Hosie Poisson, MD  Vitamin D, Ergocalciferol, (DRISDOL) 1.25 MG (50000 UNIT) CAPS capsule Take 50,000 Units by mouth every 7 (seven) days. 05/22/21   [provider]      Allergies    Cephalexin, Hydrocodone-acetaminophen, Ativan [lorazepam], Augmentin [amoxicillin-pot clavulanate], Avelox [moxifloxacin], Brimonidine, Chlorzoxazone, Ciprofloxacin hcl, Clavulanic acid, Crestor [rosuvastatin], Ezetimibe, Gluten meal, Lansoprazole, Nitrofuran derivatives, Phenergan [promethazine hcl],  Wheat, Codeine, Darvon [propoxyphene hcl], and Doxycycline    Review of Systems   Review of Systems  Gastrointestinal:  Positive for constipation.  All other systems reviewed and are negative.   Physical Exam Updated Vital Signs BP (!) 164/70 (BP Location: Right Arm)   Pulse 63   Temp 97.6 F (36.4 C) (Oral)   Resp 16   SpO2 99%  Physical Exam Vitals and nursing note reviewed. Exam conducted with a chaperone present.  Constitutional:      General: She is not in acute distress.    Appearance: Normal appearance. She is normal weight. She is not ill-appearing, toxic-appearing or diaphoretic.  HENT:     Head: Normocephalic and atraumatic.  Cardiovascular:     Rate and Rhythm: Normal rate.  Pulmonary:     Effort: Pulmonary effort is normal. No respiratory distress.  Abdominal:     General: There is distension.     Tenderness: There is abdominal tenderness.  Genitourinary:    Comments: No stool present in the rectal vault Musculoskeletal:        General: Normal range of motion.     Cervical back: Normal range of motion.  Skin:    General: Skin is warm and dry.  Neurological:     General: No focal deficit present.     Mental Status: She is alert.  Psychiatric:        Mood and Affect: Mood normal.        Behavior: Behavior normal.     ED Results / Procedures / Treatments   Labs (all labs ordered are listed, but only abnormal results are displayed) Labs Reviewed  CBC - Abnormal; Notable for the following components:      Result Value   RBC 3.69 (*)    Hemoglobin 11.4 (*)    HCT 35.8 (*)    All other components within normal limits  COMPREHENSIVE METABOLIC PANEL - Abnormal; Notable for the following components:   Sodium 131 (*)    Potassium 5.2 (*)    Glucose, Bld 112 (*)    Creatinine, Ser 1.52 (*)    Calcium 8.6 (*)    GFR, Estimated 34 (*)    All other components within normal limits    EKG None  Radiology CT ABDOMEN PELVIS WO CONTRAST  Result Date:  07/17/2022 CLINICAL DATA:  Constipation.  Abdominal pain. EXAM: CT ABDOMEN AND PELVIS WITHOUT CONTRAST TECHNIQUE: Multidetector CT imaging of the abdomen and pelvis was performed following the standard protocol without IV contrast. RADIATION DOSE REDUCTION: This exam was performed according to the departmental dose-optimization program which includes automated exposure control, adjustment of the mA and/or kV according to patient size and/or use of iterative reconstruction technique. COMPARISON:  CT 07/12/2022 and older FINDINGS: Lower chest: Heart is enlarged. Coronary artery calcifications are seen. Mild atelectasis at the lung bases. No pleural effusion. Hepatobiliary: Previous cholecystectomy. On this non IV contrast exam, grossly the liver is preserved. Pancreas: Mild atrophy of the pancreas.  No obvious pancreatic mass. Spleen: Normal in size without focal abnormality. Adrenals/Urinary Tract: Adrenal glands are preserved. No abnormal calcifications are seen within either kidney nor along  the course of either ureter. Preserved contours of the urinary bladder. There is a low-lying urinary bladder. Please correlate with prolapse. Stomach/Bowel: Once again there is a very large amount of diffuse colonic stool. Several loops of colon are ectatic as well measuring up to 8.5 cm. There is moderate debris in the stomach. Small bowel is nondilated. Surgical changes identified along loops of small bowel in the central pelvis above the bladder. Vascular/Lymphatic: Diffuse vascular calcifications are identified along the aorta and branch vessels. There an IVC filter in place once again. The IVC is small in caliber at the level of the tip of the filter. No obvious abnormal lymph node enlargement seen on this noncontrast examination. Reproductive: Status post hysterectomy. No adnexal masses. Ovaries are poorly seen. Other: Mild anasarca.  No free air or free fluid Musculoskeletal: Curvature of the spine with degenerative  changes. Degenerative changes seen of the hip joints as well. Osteopenia. IMPRESSION: Once again as on prior there is a large amount of diffuse colonic stool. No secondary changes of obstruction. Some of the loops of colon are ectatic measuring up to 8.5 cm. No free air or free fluid clearly seen. No obstructing renal stones. Low-lying urinary bladder.  Please correlate with pelvic prolapse. IVC filter. Enlarged heart Electronically Signed   By: Jill Side M.D.   On: 07/17/2022 11:14    Procedures Fecal disimpaction  Date/Time: 07/17/2022 3:07 PM  Performed by: Bud Face, PA-C Authorized by: Bud Face, PA-C  Consent: Verbal consent obtained. Risks and benefits: risks, benefits and alternatives were discussed Consent given by: patient Patient understanding: patient states understanding of the procedure being performed Patient consent: the patient's understanding of the procedure matches consent given Procedure consent: procedure consent matches procedure scheduled Relevant documents: relevant documents present and verified Test results: test results available and properly labeled Imaging studies: imaging studies available Patient identity confirmed: verbally with patient Local anesthesia used: no  Anesthesia: Local anesthesia used: no  Sedation: Patient sedated: no  Patient tolerance: patient tolerated the procedure well with no immediate complications Comments: No palpable stool present in rectum for disimpaction       Medications Ordered in ED Medications  sorbitol, milk of mag, mineral oil, glycerin (SMOG) enema (960 mLs Rectal Not Given 07/17/22 1259)  alum & mag hydroxide-simeth (MAALOX/MYLANTA) 200-200-20 MG/5ML suspension 30 mL (has no administration in time range)  sodium chloride 0.9 % bolus 500 mL (0 mLs Intravenous Stopped 07/17/22 1147)  magnesium citrate solution 1 Bottle (1 Bottle Oral Given 07/17/22 1259)    ED Course/ Medical Decision Making/ A&P                              Medical Decision Making Amount and/or Complexity of Data Reviewed Labs: ordered. Radiology: ordered.  Risk Prescription drug management.   This patient is a 87 y.o. female who presents to the ED for concern of constipation, this involves an extensive number of treatment options, and is a complaint that carries with it a high risk of complications and morbidity. The emergent differential diagnosis prior to evaluation includes, but is not limited to,  constipation, Bowel obstruction, Bowel perforation, Inflammatory bowel disease, peritonitis SBP, volvulus.  This is not an exhaustive differential.   Past Medical History / Co-morbidities / Social History: history of CHF, CKD, afib on Elliquis, Celiac disease, GERD  Additional history: Chart reviewed. Pertinent results include: recently seen and admitted 3/24-3/25 given Miralax, Senna, Doculax,  and an enema and had 1 small bowel movement, d/ced with bowel regimen  Physical Exam: Physical exam performed. The pertinent findings include: abdominal distention  Lab Tests: I ordered, and personally interpreted labs.  The pertinent results include:  Na 131, K 5.2, creatinine 1.52 up from 1.16 4 days ago   Imaging Studies: I ordered imaging studies including CT abdomen. I independently visualized and interpreted imaging which showed   Once again as on prior there is a large amount of diffuse colonic stool. No secondary changes of obstruction. Some of the loops of colon are ectatic measuring up to 8.5 cm. No free air or free fluid clearly seen.   No obstructing renal stones.   Low-lying urinary bladder.  Please correlate with pelvic prolapse.   IVC filter.   Enlarged heart  I agree with the radiologist interpretation.   Medications: I ordered medication including fluids  for dehydration, magnesium citrate and smog enema for constipation. Reevaluation of the patient after these medicines showed that the patient  stayed the same. I have reviewed the patients home medicines and have made adjustments as needed.  Consultations Obtained: I requested consultation with the gastroenterology on call Dr. Hilarie Fredrickson,  and discussed lab and imaging findings as well as pertinent plan - they recommend: they will see the patient, admit to medicine   Disposition:  Patient presents today with complaints of constipation.  She is afebrile, nontoxic-appearing, and in no acute distress with reassuring vital signs.  Physical exam reveals significant abdominal distention, CT imaging shows large amounts of stool throughout the bowel.  Attempted manual disimpaction without success.  Patient given 2 enemas without significant bowel movement.  She also has an AKI and given her failure of outpatient bowel regimen, will require admission.  Patient is understanding and in agreement.  Discussed patient with hospitalist Dr. Erlinda Hong who accepts for admission  Findings and plan of care discussed with supervising physician Dr. Nechama Guard who is in agreement.     Final Clinical Impression(s) / ED Diagnoses Final diagnoses:  AKI (acute kidney injury) ([place])  Slow transit constipation    Rx / DC Orders ED Discharge Orders     None         Nestor Lewandowsky 07/17/22 1607    Elgie Congo, MD 07/17/22 2037

## 2022-07-17 NOTE — ED Notes (Signed)
Pt has been drinking warm tea, third cup brought to pt.  Pt continues to have abdominal pain in upper abdomen and is requesting tylenol.

## 2022-07-17 NOTE — ED Notes (Signed)
Slowly instilled the liter of soap suds enema over 51minutes. Pt up to [place] at this time and sipping on magnesium citrate

## 2022-07-17 NOTE — Assessment & Plan Note (Signed)
At this time patient seems to have failed outpatient trial of laxatives.  I have no objection with continuing trial of laxatives including GoLytely and enema.  However we have to consider secondary causes of constipation at this time.  And therefore do favor having GI eval for possible colonoscopy to rule out occult stricture/stenosis/obstruction in the colon. Check TSH.

## 2022-07-17 NOTE — ED Notes (Signed)
Pt helped back to bed after results of enema which was half bucket full of liquid from enema and stool contents, no formed stool.  Pt has taken 2 containers of cranberry juice and all of the magnesium citrated.  Pads under pt changed and warm blankets given.  Pt positioned for comfort. Family remains attentive at the bedside.

## 2022-07-18 ENCOUNTER — Inpatient Hospital Stay (HOSPITAL_COMMUNITY): Payer: Medicare Other

## 2022-07-18 DIAGNOSIS — K219 Gastro-esophageal reflux disease without esophagitis: Secondary | ICD-10-CM

## 2022-07-18 DIAGNOSIS — K5900 Constipation, unspecified: Secondary | ICD-10-CM

## 2022-07-18 DIAGNOSIS — I509 Heart failure, unspecified: Secondary | ICD-10-CM | POA: Diagnosis not present

## 2022-07-18 DIAGNOSIS — E875 Hyperkalemia: Secondary | ICD-10-CM | POA: Diagnosis not present

## 2022-07-18 DIAGNOSIS — I484 Atypical atrial flutter: Secondary | ICD-10-CM

## 2022-07-18 DIAGNOSIS — R933 Abnormal findings on diagnostic imaging of other parts of digestive tract: Secondary | ICD-10-CM | POA: Diagnosis not present

## 2022-07-18 DIAGNOSIS — K900 Celiac disease: Secondary | ICD-10-CM | POA: Diagnosis not present

## 2022-07-18 DIAGNOSIS — E213 Hyperparathyroidism, unspecified: Secondary | ICD-10-CM

## 2022-07-18 DIAGNOSIS — N179 Acute kidney failure, unspecified: Secondary | ICD-10-CM | POA: Diagnosis not present

## 2022-07-18 DIAGNOSIS — I2510 Atherosclerotic heart disease of native coronary artery without angina pectoris: Secondary | ICD-10-CM

## 2022-07-18 DIAGNOSIS — N3010 Interstitial cystitis (chronic) without hematuria: Secondary | ICD-10-CM

## 2022-07-18 LAB — PROTIME-INR
INR: 1.2 (ref 0.8–1.2)
Prothrombin Time: 14.7 seconds (ref 11.4–15.2)

## 2022-07-18 LAB — TROPONIN I (HIGH SENSITIVITY)
Troponin I (High Sensitivity): 7 ng/L (ref <18)
Troponin I (High Sensitivity): 6 ng/L (ref <18)

## 2022-07-18 LAB — BASIC METABOLIC PANEL
Anion gap: 9 (ref 5–15)
BUN: 13 mg/dL (ref 8–23)
CO2: 22 mmol/L (ref 22–32)
Calcium: 8.3 mg/dL — ABNORMAL LOW (ref 8.9–10.3)
Chloride: 104 mmol/L (ref 98–111)
Creatinine, Ser: 1.23 mg/dL — ABNORMAL HIGH (ref 0.44–1.00)
GFR, Estimated: 43 mL/min — ABNORMAL LOW (ref >60)
Glucose, Bld: 129 mg/dL — ABNORMAL HIGH (ref 70–99)
Potassium: 4.6 mmol/L (ref 3.5–5.1)
Sodium: 135 mmol/L (ref 135–145)

## 2022-07-18 LAB — CBC
HCT: 36.3 % (ref 36.0–46.0)
Hemoglobin: 11.9 g/dL — ABNORMAL LOW (ref 12.0–15.0)
MCH: 31.8 pg (ref 26.0–34.0)
MCHC: 32.8 g/dL (ref 30.0–36.0)
MCV: 97.1 fL (ref 80.0–100.0)
Platelets: 188 10*3/uL (ref 150–400)
RBC: 3.74 MIL/uL — ABNORMAL LOW (ref 3.87–5.11)
RDW: 14.9 % (ref 11.5–15.5)
WBC: 7.6 10*3/uL (ref 4.0–10.5)
nRBC: 0 % (ref 0.0–0.2)

## 2022-07-18 LAB — PHOSPHORUS: Phosphorus: 1.8 mg/dL — ABNORMAL LOW (ref 2.5–4.6)

## 2022-07-18 LAB — POTASSIUM: Potassium: 5 mmol/L (ref 3.5–5.1)

## 2022-07-18 LAB — APTT: aPTT: 31 seconds (ref 24–36)

## 2022-07-18 LAB — TSH: TSH: 7.679 u[IU]/mL — ABNORMAL HIGH (ref 0.350–4.500)

## 2022-07-18 MED ORDER — CARVEDILOL 12.5 MG PO TABS
12.5000 mg | ORAL_TABLET | Freq: Two times a day (BID) | ORAL | Status: DC
  Administered 2022-07-18 – 2022-07-19 (×4): 12.5 mg via ORAL
  Filled 2022-07-18 (×4): qty 1

## 2022-07-18 MED ORDER — AMIODARONE HCL 200 MG PO TABS
200.0000 mg | ORAL_TABLET | Freq: Every day | ORAL | Status: DC
  Administered 2022-07-18 – 2022-07-20 (×3): 200 mg via ORAL
  Filled 2022-07-18 (×3): qty 1

## 2022-07-18 MED ORDER — LEVOTHYROXINE SODIUM 100 MCG PO TABS
100.0000 ug | ORAL_TABLET | Freq: Every day | ORAL | Status: DC
  Administered 2022-07-19 – 2022-07-20 (×2): 100 ug via ORAL
  Filled 2022-07-18 (×2): qty 1

## 2022-07-18 MED ORDER — SODIUM POLYSTYRENE SULFONATE 15 GM/60ML PO SUSP
30.0000 g | Freq: Once | ORAL | Status: DC
  Filled 2022-07-18: qty 120

## 2022-07-18 MED ORDER — ONDANSETRON HCL 4 MG/2ML IJ SOLN
4.0000 mg | Freq: Four times a day (QID) | INTRAMUSCULAR | Status: DC | PRN
  Administered 2022-07-20: 4 mg via INTRAVENOUS
  Filled 2022-07-18: qty 2

## 2022-07-18 MED ORDER — HYDRALAZINE HCL 50 MG PO TABS
50.0000 mg | ORAL_TABLET | Freq: Three times a day (TID) | ORAL | Status: DC
  Administered 2022-07-18 – 2022-07-19 (×2): 50 mg via ORAL
  Filled 2022-07-18 (×2): qty 1

## 2022-07-18 MED ORDER — INSULIN ASPART 100 UNIT/ML IV SOLN
5.0000 [IU] | Freq: Once | INTRAVENOUS | Status: AC
  Administered 2022-07-18: 5 [IU] via INTRAVENOUS

## 2022-07-18 MED ORDER — SODIUM ZIRCONIUM CYCLOSILICATE 10 G PO PACK
10.0000 g | PACK | Freq: Once | ORAL | Status: AC
  Administered 2022-07-18: 10 g via ORAL
  Filled 2022-07-18: qty 1

## 2022-07-18 MED ORDER — DEXTROSE 50 % IV SOLN
1.0000 | Freq: Once | INTRAVENOUS | Status: AC
  Administered 2022-07-18: 50 mL via INTRAVENOUS
  Filled 2022-07-18: qty 50

## 2022-07-18 MED ORDER — ALBUTEROL SULFATE (2.5 MG/3ML) 0.083% IN NEBU
2.5000 mg | INHALATION_SOLUTION | RESPIRATORY_TRACT | Status: AC
  Administered 2022-07-18 (×3): 2.5 mg via RESPIRATORY_TRACT
  Filled 2022-07-18: qty 3

## 2022-07-18 MED ORDER — PANTOPRAZOLE SODIUM 40 MG PO TBEC
40.0000 mg | DELAYED_RELEASE_TABLET | Freq: Every day | ORAL | Status: DC
  Administered 2022-07-18 – 2022-07-20 (×3): 40 mg via ORAL
  Filled 2022-07-18 (×3): qty 1

## 2022-07-18 NOTE — Progress Notes (Addendum)
PROGRESS NOTE        PATIENT DETAILS Name: Amy Hickman Age: 87 y.o. Sex: female Date of Birth: October 26, 1935 Admit Date: 07/17/2022 Admitting Physician Gertie Fey, MD [id number] [facility]  Brief Summary: Patient is a 87 y.o.  female with history of HFrEF, PAF on Eliquis-who presented with intractable constipation (in spite of multiple laxatives/enemas) x 3 weeks.  Significant events: 3/29>> admit to [facility]  Significant studies: 3/29>> CT abdomen/pelvis: Large stool burden-no obstruction  Significant microbiology data: None  Procedures: None  Consults: GI  Subjective: Less abdominal pain-has had some BMs overnight.  Objective: Vitals: Blood pressure 135/69, pulse (!) 55, temperature 98.1 F (36.7 C), temperature source Oral, resp. rate 19, height 5' (1.524 m), weight 71 kg, SpO2 97 %.   Exam: Gen Exam:Alert awake-not in any distress HEENT:atraumatic, normocephalic Chest: B/L clear to auscultation anteriorly CVS:S1S2 regular Abdomen:soft non tender, non distended Extremities:no edema Neurology: Non focal Skin: no rash  Pertinent Labs/Radiology:    Latest Ref Rng & Units 07/18/2022    2:25 AM 07/17/2022    9:37 PM 07/17/2022    9:38 AM  CBC  WBC 4.0 - 10.5 K/uL 7.6  7.5  7.0   Hemoglobin 12.0 - 15.0 g/dL 11.9  11.3  11.4   Hematocrit 36.0 - 46.0 % 36.3  34.8  35.8   Platelets 150 - 400 K/uL 188  180  215     Lab Results  Component Value Date   NA 135 07/18/2022   K 4.6 07/18/2022   CL 104 07/18/2022   CO2 22 07/18/2022      Assessment/Plan: Abdominal discomfort due to severe constipation Ongoing for 3 weeks-in spite of over-the-counter laxatives/enemas Disimpaction attempted in the ED yesterday-no stool removed-has nonpalpable Reviewed outpatient GI note from [place] sigmoidoscopy was being planned Overall improving-had some BMs overnight-continue GoLytely for now-await GI  evaluation-to see if patient requires endoscopic evaluation due to change in bowel habit pattern.  Hyperkalemia Resolved  AKI Mild Improving with supportive care  PAF Resume beta-blocker/amiodarone Continue to hold Eliquis until endoscopic evaluation is complete  Chronic HFrEF Euvolemic on exam  HTN BP stable-but creeping up Coreg resumed Resume rest of antihypertensives-depending on BP trend.  Hypothyroidism Synthroid TSH only minimally elevated-unclear when her last Synthroid adjustment was-suspect can follow-up with PCP for further optimization  Obesity: Estimated body mass index is 30.57 kg/m as calculated from the following:   Height as of this encounter: 5' (1.524 m).   Weight as of this encounter: 71 kg.   Code status:   Code Status: Full Code   DVT Prophylaxis: enoxaparin (LOVENOX) injection 30 mg Start: 07/18/22 0800 SCDs Start: 07/17/22 2031    Family Communication: None at bedside   Disposition Plan: Status is: Inpatient Remains inpatient appropriate because: Severity of illness   Planned Discharge Destination:Home   Diet: Diet Order             Diet NPO time specified Except for: Sips with Meds, Ice Chips  Diet effective now                     Antimicrobial agents: Anti-infectives (From admission, onward)    None        MEDICATIONS: Scheduled Meds:  enoxaparin (LOVENOX) injection  30 mg Subcutaneous Q24H   senna-docusate  1 tablet Oral  BID   sodium chloride flush  3 mL Intravenous Q12H   sodium polystyrene  30 g Rectal Once   sorbitol, milk of mag, mineral oil, glycerin (SMOG) enema  960 mL Rectal Once   Continuous Infusions:  dextrose 5 % and 0.45% NaCl 75 mL/hr at 07/18/22 0459   PRN Meds:.acetaminophen **OR** acetaminophen, ondansetron (ZOFRAN) IV   I have personally reviewed following labs and imaging studies  LABORATORY DATA: CBC: Recent Labs  Lab 07/12/22 1845 07/13/22 0111 07/17/22 0938 07/17/22 2137  07/18/22 0225  WBC 7.2 6.8 7.0 7.5 7.6  NEUTROABS 5.5 5.3  --   --   --   HGB 12.7 12.1 11.4* 11.3* 11.9*  HCT 39.2 38.4 35.8* 34.8* 36.3  MCV 94.9 97.0 97.0 96.9 97.1  PLT 221 204 215 180 0000000    Basic Metabolic Panel: Recent Labs  Lab 07/12/22 1845 07/13/22 0111 07/17/22 0938 07/17/22 2137 07/18/22 0040 07/18/22 0225  NA 133* 134* 131* 135  --  135  K 4.8 5.0 5.2* 5.9* 5.0 4.6  CL 100 103 103 103  --  104  CO2 26 23 22 23   --  22  GLUCOSE 113* 135* 112* 116*  --  129*  BUN 33* 27* 22 14  --  13  CREATININE 1.33* 1.16* 1.52* 1.30*  --  1.23*  CALCIUM 9.3 8.8* 8.6* 8.7*  --  8.3*  PHOS  --   --   --   --  1.8*  --     GFR: Estimated Creatinine Clearance: 29.9 mL/min (A) (by C-G formula based on SCr of 1.23 mg/dL (H)).  Liver Function Tests: Recent Labs  Lab 07/12/22 1845 07/17/22 0938  AST 35 28  ALT 30 33  ALKPHOS 51 49  BILITOT 0.7 0.3  PROT 6.7 6.5  ALBUMIN 3.8 3.8   No results for input(s): "LIPASE", "AMYLASE" in the last 168 hours. No results for input(s): "AMMONIA" in the last 168 hours.  Coagulation Profile: Recent Labs  Lab 07/18/22 0225  INR 1.2    Cardiac Enzymes: No results for input(s): "CKTOTAL", "CKMB", "CKMBINDEX", "TROPONINI" in the last 168 hours.  BNP (last 3 results) No results for input(s): "PROBNP" in the last 8760 hours.  Lipid Profile: No results for input(s): "CHOL", "HDL", "LDLCALC", "TRIG", "CHOLHDL", "LDLDIRECT" in the last 72 hours.  Thyroid Function Tests: Recent Labs    07/18/22 0225  TSH 7.679*    Anemia Panel: No results for input(s): "VITAMINB12", "FOLATE", "FERRITIN", "TIBC", "IRON", "RETICCTPCT" in the last 72 hours.  Urine analysis:    Component Value Date/Time   COLORURINE YELLOW 06/22/2022 0923   APPEARANCEUR HAZY (A) 06/22/2022 0923   LABSPEC 1.020 06/22/2022 0923   PHURINE 6.0 06/22/2022 0923   GLUCOSEU NEGATIVE 06/22/2022 0923   HGBUR LARGE (A) 06/22/2022 0923   BILIRUBINUR NEGATIVE 06/22/2022  0923   KETONESUR NEGATIVE 06/22/2022 0923   PROTEINUR >=300 (A) 06/22/2022 0923   UROBILINOGEN 0.2 03/27/2014 2129   NITRITE NEGATIVE 06/22/2022 0923   LEUKOCYTESUR SMALL (A) 06/22/2022 0923    Sepsis Labs: Lactic Acid, Venous    Component Value Date/Time   LATICACIDVEN 1.3 03/16/2021 0149    MICROBIOLOGY: No results found for this or any previous visit (from the past 240 hour(s)).  RADIOLOGY STUDIES/RESULTS: DG CHEST PORT 1 VIEW  Result Date: 07/18/2022 CLINICAL DATA:  Sudden onset chest pain EXAM: PORTABLE CHEST 1 VIEW COMPARISON:  05/28/2018 FINDINGS: Check shadow is mildly enlarged but accentuated by the portable technique. Aortic calcifications are noted.  The lungs are well aerated bilaterally. Chronic blunting of left costophrenic angle is seen. Postsurgical changes in the shoulders are noted. IMPRESSION: No acute abnormality noted. Electronically Signed   By: Inez Catalina M.D.   On: 07/18/2022 02:29   DG Abd 2 Views  Result Date: 07/17/2022 CLINICAL DATA:  Constipation. EXAM: ABDOMEN - 2 VIEW COMPARISON:  Radiograph dated 05/28/2022. FINDINGS: Evaluation is limited due to body habitus. No significant colonic stool burden. There is air throughout the colon. An IVC filter is noted. Multiple surgical clips in the epigastric area. Degenerative changes of the spine. No acute osseous pathology. IMPRESSION: Nonobstructive bowel gas pattern. Electronically Signed   By: Anner Crete M.D.   On: 07/17/2022 22:28   CT ABDOMEN PELVIS WO CONTRAST  Result Date: 07/17/2022 CLINICAL DATA:  Constipation.  Abdominal pain. EXAM: CT ABDOMEN AND PELVIS WITHOUT CONTRAST TECHNIQUE: Multidetector CT imaging of the abdomen and pelvis was performed following the standard protocol without IV contrast. RADIATION DOSE REDUCTION: This exam was performed according to the departmental dose-optimization program which includes automated exposure control, adjustment of the mA and/or kV according to patient size  and/or use of iterative reconstruction technique. COMPARISON:  CT 07/12/2022 and older FINDINGS: Lower chest: Heart is enlarged. Coronary artery calcifications are seen. Mild atelectasis at the lung bases. No pleural effusion. Hepatobiliary: Previous cholecystectomy. On this non IV contrast exam, grossly the liver is preserved. Pancreas: Mild atrophy of the pancreas.  No obvious pancreatic mass. Spleen: Normal in size without focal abnormality. Adrenals/Urinary Tract: Adrenal glands are preserved. No abnormal calcifications are seen within either kidney nor along the course of either ureter. Preserved contours of the urinary bladder. There is a low-lying urinary bladder. Please correlate with prolapse. Stomach/Bowel: Once again there is a very large amount of diffuse colonic stool. Several loops of colon are ectatic as well measuring up to 8.5 cm. There is moderate debris in the stomach. Small bowel is nondilated. Surgical changes identified along loops of small bowel in the central pelvis above the bladder. Vascular/Lymphatic: Diffuse vascular calcifications are identified along the aorta and branch vessels. There an IVC filter in place once again. The IVC is small in caliber at the level of the tip of the filter. No obvious abnormal lymph node enlargement seen on this noncontrast examination. Reproductive: Status post hysterectomy. No adnexal masses. Ovaries are poorly seen. Other: Mild anasarca.  No free air or free fluid Musculoskeletal: Curvature of the spine with degenerative changes. Degenerative changes seen of the hip joints as well. Osteopenia. IMPRESSION: Once again as on prior there is a large amount of diffuse colonic stool. No secondary changes of obstruction. Some of the loops of colon are ectatic measuring up to 8.5 cm. No free air or free fluid clearly seen. No obstructing renal stones. Low-lying urinary bladder.  Please correlate with pelvic prolapse. IVC filter. Enlarged heart Electronically  Signed   By: Jill Side M.D.   On: 07/17/2022 11:14     LOS: 1 day   Oren Binet, MD  [facility]    To contact the attending provider between 7A-7P or the covering provider during after hours 7P-7A, please log into the web site [email or web address] and access using universal [place] password for that web site. If you do not have the password, please call the hospital operator.  07/18/2022, 9:12 AM

## 2022-07-18 NOTE — Assessment & Plan Note (Signed)
Holding eliquis

## 2022-07-18 NOTE — Consult Note (Signed)
Consultation  Referring Provider: [facility] Primary Care Physician:  [facility] Primary Gastroenterologist:  none/ unassigned--sees [facility]  Reason for Consultation:  severe obstipation  HPI: Amy Hickman is a 87 y.o. female who we are asked to see for severe constipation.  Patient has longstanding history of celiac disease, and has had multiple prior abdominal surgeries including repair for intussusception as a child, appendectomy, hysterectomy, she relates to previous bowel obstructions requiring laparotomy and resections and 1 surgery that required an open wound closure.  Also with history of chronic atrial fibrillation for which she is on Eliquis, congestive heart failure, coronary artery disease, hyperparathyroidism hypertension and interstitial cystitis.  Generally tends to have loose stools but she and her granddaughter relate that every once in a while she will wind up with a bad episode of obstipation.  This episode started a couple of weeks ago was seen at [facility] gastroenterology and told to take MiraLAX and prune juice or to try a bottle of mag citrate.  They have tried multiple different laxatives at home including a total of about 3 bottles of mag citrate.  She had a short hospitalization last week overnight and was given Lasix and Gatorade and asked to continue on that with 4 doses of MiraLAX daily mixed with Gatorade at home.  Since that time over the past 5 days she still had not had any further bowel movements and developed abdominal distention and discomfort which she has been feeling more in the left upper quadrant.  She has not had any nausea or vomiting.  She presented to the emergency room yesterday despite enemas in the emergency room and did not have any stool on rectal exam for disimpaction. CT imaging showed a large amount of stool throughout the entire colon with some dilated loops of colon up to 8.5 cm no dilated  small bowel felt to have an ectatic appearing colon, IVC filter in place, status post cholecystectomy and noted to have a low-lying bladder.  Patient was made to admit her.  Yesterday WBC 7.5/hemoglobin 11.3/hematocrit 34.8 K  5.2/sodium 131 BUN 22/creatinine 1.52 LFTs within normal limits Phosphorus 1.8  TSH 7.67-sh was elevated at 10 about 4 months ago  Was started on a gallon of GoLytely after admission, has consumed about half of that and per her nurse had 5 bowel movements last night, per nurse passed a large amount of stool.  Patient says she does feel better but still feels somewhat bloated and distended today.  She generally does not require any sort of bowel regimen at home she has not been started on any new medicines or had any other precipitating factors obvious over the past 3 weeks. Has had multiple prior colonoscopies, she believes the last 1 was done in 2014 or 15.     Past Medical History:  Diagnosis Date   Atrial fibrillation, chronic (HCC)    s/p multiple cardioversions with sustained NSR, on Eliquis   Celiac disease    Chronic systolic CHF (congestive heart failure) (HCC)    Coronary artery disease involving native coronary artery of native heart without angina pectoris 03/15/2021   GERD (gastroesophageal reflux disease)    HOH (hard of hearing)    Hyperparathyroidism ([place]) 12/12/2020   Hypertension    Interstitial cystitis     Past Surgical History:  Procedure Laterality Date   ABDOMINAL HYSTERECTOMY     APPENDECTOMY     CARDIOVERSION N/A 04/24/2021   Procedure: CARDIOVERSION;  Surgeon: Jerline Pain, MD;  Location[facility] ENDOSCOPY;  Service: Cardiovascular;  Laterality: N/A;   CARDIOVERSION N/A 06/12/2021   Procedure: CARDIOVERSION;  Surgeon: Buford Dresser, MD;  Location: [place];  Service: Cardiovascular;  Laterality: N/A;   CARDIOVERSION N/A 08/11/2021   Procedure: CARDIOVERSION;  Surgeon: [name], MD;  Location: [facility] ENDOSCOPY;   Service: Cardiovascular;  Laterality: N/A;   CHOLECYSTECTOMY     ear drum surgery     REPLACEMENT TOTAL KNEE     ROTATOR CUFF REPAIR Right    TONSILLECTOMY      Prior to Admission medications   Medication Sig Start Date End Date Taking? Authorizing Provider  acetaminophen (TYLENOL) 500 MG tablet Take 1 tablet (500 mg total) by mouth every 6 (six) hours as needed for pain. Patient taking differently: Take 1,000 mg by mouth every 6 (six) hours as needed for pain or moderate pain. 06/24/12   [name], MD  amiodarone (PACERONE) 200 MG tablet Take 1 tablet (200 mg total) by mouth daily. 03/05/22   Sherran Needs, NP  apixaban (ELIQUIS) 5 MG TABS tablet Take 1 tablet (5 mg total) by mouth 2 (two) times daily. 06/23/22   Buford Dresser, MD  B Complex Vitamins (VITAMIN B COMPLEX) TABS Take 1 tablet by mouth daily.    [provider]  carvedilol (COREG) 12.5 MG tablet Take 1 tablet (12.5 mg total) by mouth 2 (two) times daily. Replaces metoprolol 05/29/22 06/28/22  Buford Dresser, MD  clidinium-chlordiazePOXIDE (LIBRAX) 5-2.5 MG capsule Take 2 capsules by mouth daily as needed (cramping). 02/28/21   [provider]  CRANBERRY-VITAMIN C PO Take 1 capsule by mouth in the morning.    [provider]  diltiazem (CARDIZEM CD) 360 MG 24 hr capsule Take 1 capsule (360 mg total) by mouth daily. 06/12/21   Buford Dresser, MD  esomeprazole (NEXIUM) 40 MG capsule Take 40 mg by mouth daily before breakfast.    [provider]  hydrALAZINE (APRESOLINE) 50 MG tablet Take 2 tablets (100 mg total) by mouth 3 (three) times daily. 06/23/22   Buford Dresser, MD  isosorbide dinitrate (ISORDIL) 10 MG tablet Take 10 mg by mouth 2 (two) times daily. 12/31/21   [provider]  levothyroxine (SYNTHROID) 100 MCG tablet Take 100 mcg by mouth daily before breakfast. 02/09/22   [provider]  Netarsudil-Latanoprost (ROCKLATAN) 0.02-0.005 % SOLN  Place 1 drop into both eyes at bedtime. 12/30/21   [provider]  polyethylene glycol (MIRALAX / GLYCOLAX) 17 g packet Take 17 g by mouth 2 (two) times daily. 07/13/22   Danford, Suann Larry, MD  polyvinyl alcohol (ARTIFICIAL TEARS) 1.4 % ophthalmic solution Place 1 drop into both eyes as needed for dry eyes.    [provider]  senna-docusate (SENOKOT-S) 8.6-50 MG tablet Take 2 tablets by mouth 2 (two) times daily. 03/22/22   Hosie Poisson, MD  Vitamin D, Ergocalciferol, (DRISDOL) 1.25 MG (50000 UNIT) CAPS capsule Take 50,000 Units by mouth every 7 (seven) days. 05/22/21   [provider]    Current Facility-Administered Medications  Medication Dose Route Frequency Provider Last Rate Last Admin   acetaminophen (TYLENOL) tablet 650 mg  650 mg Oral Q6H PRN Gertie Fey, MD       Or   acetaminophen (TYLENOL) suppository 650 mg  650 mg Rectal Q6H PRN Gertie Fey, MD       dextrose 5 %-0.45 % sodium chloride infusion   Intravenous Continuous Gertie Fey, MD 75 mL/hr at  07/18/22 0459 Infusion Verify at 07/18/22 0459   enoxaparin (LOVENOX) injection 30 mg  30 mg Subcutaneous Q24H Gertie Fey, MD       ondansetron [facility]) injection 4 mg  4 mg Intravenous Q6H PRN Gertie Fey, MD       senna-docusate (Senokot-S) tablet 1 tablet  1 tablet Oral BID Gertie Fey, MD   1 tablet at 07/18/22 0054   sodium chloride flush (NS) 0.9 % injection 3 mL  3 mL Intravenous Lennart Pall, MD   3 mL at 07/18/22 0045   sodium polystyrene (KAYEXALATE) 15 GM/60ML suspension 30 g  30 g Rectal Once Gertie Fey, MD       sorbitol, milk of mag, mineral oil, glycerin (SMOG) enema  960 mL Rectal Once Gertie Fey, MD        Allergies as of 07/17/2022 - Review Complete 07/17/2022  Allergen Reaction Noted   Cephalexin Other (See Comments) 07/31/2016   Hydrocodone-acetaminophen Other (See Comments) and Rash 10/01/2011   Ativan [lorazepam] Other (See Comments) 05/01/2012   Augmentin [amoxicillin-pot  clavulanate] Nausea And Vomiting 03/15/2021   Avelox [moxifloxacin] Itching and Nausea Only 03/15/2021   Brimonidine Itching and Swelling 12/31/2021   Chlorzoxazone Other (See Comments) 03/15/2021   Ciprofloxacin hcl Itching and Nausea Only 05/01/2012   Clavulanic acid Other (See Comments) 12/31/2021   Crestor [rosuvastatin] Other (See Comments) 03/15/2021   Ezetimibe Diarrhea 12/31/2021   Gluten meal Other (See Comments) 05/01/2012   Lansoprazole Other (See Comments) 04/27/2011   Nitrofuran derivatives Itching and Nausea Only 12/23/2014   Phenergan [promethazine hcl] Other (See Comments) 05/01/2012   Wheat Other (See Comments) 12/31/2021   Codeine Palpitations 05/01/2012   Darvon [propoxyphene hcl] Palpitations 05/01/2012   Doxycycline Itching and Nausea Only 07/29/2016    Family History  Problem Relation Age of Onset   Hypertension Other     Social History   Socioeconomic History   Marital status: Widowed    Spouse name: Not on file   Number of children: Not on file   Years of education: Not on file   Highest education level: Not on file  Occupational History   Occupation: retired  Tobacco Use   Smoking status: Former    Packs/day: 2.00    Years: 33.00    Additional pack years: 0.00    Total pack years: 66.00    Types: Cigarettes    Quit date: 1981    Years since quitting: 43.2   Smokeless tobacco: Never  Substance and Sexual Activity   Alcohol use: Not Currently    Comment: rare   Drug use: No   Sexual activity: Not on file  Other Topics Concern   Not on file  Social History Narrative   Not on file   Social Determinants of Health   Financial Resource Strain: Not on file  Food Insecurity: No Food Insecurity (07/17/2022)   Hunger Vital Sign    Worried About Running Out of Food in the Last Year: Never true    Ran Out of Food in the Last Year: Never true  Transportation Needs: No Transportation Needs (07/17/2022)   PRAPARE - Radiographer, therapeutic (Medical): No    Lack of Transportation (Non-Medical): No  Physical Activity: Not on file  Stress: Not on file  Social Connections: Not on file  Intimate Partner Violence: Not At Risk (07/17/2022)   Humiliation, Afraid, Rape, and Kick questionnaire    Fear of Current or Ex-Partner: No    Emotionally  Abused: No    Physically Abused: No    Sexually Abused: No    Review of Systems: Pertinent positive and negative review of systems were noted in the above HPI section.  All other review of systems was otherwise negative.   Physical Exam: Vital signs in last 24 hours: Temp:  [97.4 F (36.3 C)-98.1 F (36.7 C)] 98.1 F (36.7 C) (03/30 0700) Pulse Rate:  [53-66] 55 (03/30 0328) Resp:  [16-20] 19 (03/30 0328) BP: (116-164)/(47-90) 135/69 (03/30 0700) SpO2:  [93 %-99 %] 97 % (03/30 0633) FiO2 (%):  [21 %] 21 % (03/29 2031) Weight:  [71 kg] 71 kg (03/29 1826) Last BM Date : 07/18/22 General:   Alert,  Well-developed, well-nourished, elderly white female pleasant and cooperative in NAD -family at bedside Head:  Normocephalic and atraumatic. Eyes:  Sclera clear, no icterus.   Conjunctiva pink. Ears:  Normal auditory acuity. Nose:  No deformity, discharge,  or lesions. Mouth:  No deformity or lesions.   Neck:  Supple; no masses or thyromegaly. Lungs:  Clear throughout to auscultation.   No wheezes, crackles, or rhonchi.  Heart:  Regular rate and rhythm; no murmurs, clicks, rubs,  or gallops. Abdomen:  Soft, mildly distended and tympanitic BS are active,no palp mass or hsm.  Multiple incisional scars Rectal: Not done Msk:  Symmetrical without gross deformities. . Pulses:  Normal pulses noted. Extremities:  Without clubbing or edema. Neurologic:  Alert and  oriented x4;  grossly normal neurologically. Skin:  Intact without significant lesions or rashes.. Psych:  Alert and cooperative. Normal mood and affect.  Intake/Output from previous day: 03/29 0701 - 03/30 0700 In:  2114.1 [P.O.:800; I.V.:314.1; IV Piggyback:1000] Out: -  Intake/Output this shift: No intake/output data recorded.  Lab Results: Recent Labs    07/17/22 0938 07/17/22 2137 07/18/22 0225  WBC 7.0 7.5 7.6  HGB 11.4* 11.3* 11.9*  HCT 35.8* 34.8* 36.3  PLT 215 180 188   BMET Recent Labs    07/17/22 0938 07/17/22 2137 07/18/22 0040 07/18/22 0225  NA 131* 135  --  135  K 5.2* 5.9* 5.0 4.6  CL 103 103  --  104  CO2 22 23  --  22  GLUCOSE 112* 116*  --  129*  BUN 22 14  --  13  CREATININE 1.52* 1.30*  --  1.23*  CALCIUM 8.6* 8.7*  --  8.3*   LFT Recent Labs    07/17/22 0938  PROT 6.5  ALBUMIN 3.8  AST 28  ALT 33  ALKPHOS 49  BILITOT 0.3   PT/INR Recent Labs    07/18/22 0225  LABPROT 14.7  INR 1.2   Hepatitis Panel No results for input(s): "HEPBSAG", "HCVAB", "HEPAIGM", "HEPBIGM" in the last 72 hours.    IMPRESSION:  #93 87 year old white female with severe obstipation over the past couple of weeks in setting of celiac disease and normal bowel pattern of looser stool  She had an overnight admission last week with partial purge of bowel but since that time had not had any further bowel movements for 5 days and Tama Gander presented back to the emergency room.  CT scan confirms a large amount of stool throughout the colon somewhat ectatic appearing colon with some dilated loops to 8.5 cm but no evidence of obstruction and no small bowel dilation   Fortunately she has had good success with half a gallon of GoLytely last evening and was able to have about 5 bowel movements. KUB this morning is a  nonobstructive bowel gas pattern and no obvious dilation  Not certain what precipitated this episode no new medications anesthesia etc.  She is hypothyroid and had a TSH of 10 about 4 months ago TSH is still elevated certainly this may be contributing to constipation  #2 celiac disease-longstanding patient compliant with diet 3 multiple prior abdominal surgeries as outlined  above 4.  Fibrillation chronic on Eliquis 5.  Chronic congestive heart failure 6.  Coronary artery disease 7.  GERD 8.  Hyperparathyroidism #9 interstitial cystitis  Plan; new clear liquid diet today while she completes a GoLytely prep, then okay to advance diet as tolerated  She would benefit from staying on a bowel regimen at discharge, can start with 2 glasses of MiraLAX per day  to be taken on a regular basis.  Levothyroxine may need to be increased, will defer to medicine service  GI will follow-up in a.m. Continue follow-up with [facility] gastro[facility] on discharge   [place] PA-C 07/18/2022, 8:53 AM

## 2022-07-19 ENCOUNTER — Inpatient Hospital Stay (HOSPITAL_COMMUNITY): Payer: Medicare Other

## 2022-07-19 DIAGNOSIS — N179 Acute kidney failure, unspecified: Secondary | ICD-10-CM | POA: Diagnosis not present

## 2022-07-19 DIAGNOSIS — E875 Hyperkalemia: Secondary | ICD-10-CM | POA: Diagnosis not present

## 2022-07-19 DIAGNOSIS — I484 Atypical atrial flutter: Secondary | ICD-10-CM | POA: Diagnosis not present

## 2022-07-19 DIAGNOSIS — K5900 Constipation, unspecified: Secondary | ICD-10-CM | POA: Diagnosis not present

## 2022-07-19 LAB — BASIC METABOLIC PANEL
Anion gap: 12 (ref 5–15)
BUN: 9 mg/dL (ref 8–23)
CO2: 19 mmol/L — ABNORMAL LOW (ref 22–32)
Calcium: 8.2 mg/dL — ABNORMAL LOW (ref 8.9–10.3)
Chloride: 105 mmol/L (ref 98–111)
Creatinine, Ser: 1.09 mg/dL — ABNORMAL HIGH (ref 0.44–1.00)
GFR, Estimated: 50 mL/min — ABNORMAL LOW (ref >60)
Glucose, Bld: 94 mg/dL (ref 70–99)
Potassium: 4.3 mmol/L (ref 3.5–5.1)
Sodium: 136 mmol/L (ref 135–145)

## 2022-07-19 MED ORDER — ENOXAPARIN SODIUM 40 MG/0.4ML IJ SOSY
40.0000 mg | PREFILLED_SYRINGE | INTRAMUSCULAR | Status: DC
  Filled 2022-07-19: qty 0.4

## 2022-07-19 MED ORDER — HYDRALAZINE HCL 50 MG PO TABS
100.0000 mg | ORAL_TABLET | Freq: Three times a day (TID) | ORAL | Status: DC
  Administered 2022-07-19 – 2022-07-20 (×3): 100 mg via ORAL
  Filled 2022-07-19 (×3): qty 2

## 2022-07-19 MED ORDER — HYDRALAZINE HCL 20 MG/ML IJ SOLN
10.0000 mg | Freq: Four times a day (QID) | INTRAMUSCULAR | Status: DC | PRN
  Administered 2022-07-19 (×2): 10 mg via INTRAVENOUS
  Filled 2022-07-19 (×2): qty 1

## 2022-07-19 MED ORDER — ISOSORBIDE DINITRATE 10 MG PO TABS
10.0000 mg | ORAL_TABLET | Freq: Two times a day (BID) | ORAL | Status: DC
  Administered 2022-07-19 – 2022-07-20 (×3): 10 mg via ORAL
  Filled 2022-07-19 (×3): qty 1

## 2022-07-19 NOTE — Discharge Summary (Signed)
PATIENT DETAILS Name: Amy Hickman Age: 87 y.o. Sex: female Date of Birth: 1936/03/25 MRN: [id number]. Admitting Physician: Gertie Fey, MD [id number] [facility]  Admit Date: 07/17/2022 Discharge date: 07/20/2022  Recommendations for Outpatient Follow-up:  Follow up with PCP in 1-2 weeks Please obtain CMP/CBC in one week Please ensure follow-up with endocrinology, gastroenterology  Admitted From:  Home  Disposition: Home   Discharge Condition: fair  CODE STATUS:   Code Status: Full Code   Diet recommendation:  Diet Order             Diet Heart Room service appropriate? Yes; Fluid consistency: Thin  Diet effective now           Diet - low sodium heart healthy                    Brief Summary: Patient is a 87 y.o.  female with history of HFrEF, PAF on Eliquis-who presented with intractable constipation (in spite of multiple laxatives/enemas) x 3 weeks.   Significant events: 3/29>> admit to [facility]   Significant studies: 3/29>> CT abdomen/pelvis: Large stool burden-no obstruction   Significant microbiology data: None   Procedures: None   Consults: GI  Brief Hospital Course: Abdominal discomfort due to severe constipation Ongoing for 3 weeks-in spite of over-the-counter laxatives/enemas Disimpaction attempted in the ED yesterday-no stool removed-as none was palpable.  Subsequently admitted and given GoLytely with significant bowel movements over the past several days.  Discussed with GI MD-Dr. Malachy Mood plans for endoscopic evaluation.  Will continue MiraLAX twice daily and Senokot on discharge.  Patient will need to follow-up with primary gastroenterologist for further continued care/follow-up.    Hyperkalemia Resolved   AKI Mild Improving with supportive care   PAF Continue beta-blocker/amiodarone/Cardizem Resume Eliquis.    Chronic HFrEF Euvolemic on exam   HTN BP stable-but creeping up Will resume all of her  usual antihypertensive on discharge.   Hypothyroidism Synthroid TSH only minimally elevated-unclear when her last Synthroid adjustment was-suspect can follow-up with PCP for further optimization   Obesity: Estimated body mass index is 30.57 kg/m as calculated from the following:   Height as of this encounter: 5' (1.524 m).   Weight as of this encounter: 71 kg.   Discussed with patient at bedside-RN present-she has been ambulating without any major issues.    Discharge Diagnoses:  Principal Problem:   AKI (acute kidney injury) Active Problems:   Atrial flutter   Constipation   Hyperkalemia   Abnormal CT scan, colon   Discharge Instructions:  Activity:  As tolerated    Discharge Instructions     Diet - low sodium heart healthy   Complete by: As directed    Discharge instructions   Complete by: As directed    Follow with Primary MD  [facility] in 1-2 weeks  Follow-up with primary gastroenterologist in the next 1-2 weeks  Follow-up with your primary endocrinologist in the next 1-2 weeks to for adjustment of Synthroid dosing.  Please get a complete blood count and chemistry panel checked by your Primary MD at your next visit, and again as instructed by your Primary MD.  Get Medicines reviewed and adjusted: Please take all your medications with you for your next visit with your Primary MD  Laboratory/radiological data: Please request your Primary MD to go over all hospital tests and procedure/radiological results at the follow up, please ask your Primary MD to get all [facility]  records sent to his/her office.  In some cases, they will be blood work, cultures and biopsy results pending at the time of your discharge. Please request that your primary care M.D. follows up on these results.  Also Note the following: If you experience worsening of your admission symptoms, develop shortness of breath, life threatening emergency, suicidal or  homicidal thoughts you must seek medical attention immediately by calling 911 or calling your MD immediately  if symptoms less severe.  You must read complete instructions/literature along with all the possible adverse reactions/side effects for all the Medicines you take and that have been prescribed to you. Take any new Medicines after you have completely understood and accpet all the possible adverse reactions/side effects.   Do not drive when taking Pain medications or sleeping medications (Benzodaizepines)  Do not take more than prescribed Pain, Sleep and Anxiety Medications. It is not advisable to combine anxiety,sleep and pain medications without talking with your primary care practitioner  Special Instructions: If you have smoked or chewed Tobacco  in the last 2 yrs please stop smoking, stop any regular Alcohol  and or any Recreational drug use.  Wear Seat belts while driving.  Please note: You were cared for by a hospitalist during your hospital stay. Once you are discharged, your primary care physician will handle any further medical issues. Please note that NO REFILLS for any discharge medications will be authorized once you are discharged, as it is imperative that you return to your primary care physician (or establish a relationship with a primary care physician if you do not have one) for your post hospital discharge needs so that they can reassess your need for medications and monitor your lab values.   Increase activity slowly   Complete by: As directed       Allergies as of 07/20/2022       Reactions   Cephalexin Other (See Comments)   Abdominal pain , GI distress, Abdominal pain , GI distress   Hydrocodone-acetaminophen Other (See Comments), Rash   Palpitations, flushing, hyperactivity, Chest pain, Chest pain   Ativan [lorazepam] Other (See Comments)   "Knocked her out, woke up 3 days later"   Augmentin [amoxicillin-pot Clavulanate] Nausea And Vomiting   Avelox  [moxifloxacin] Itching, Nausea Only   Brimonidine Itching, Swelling   redness   Chlorzoxazone Other (See Comments)   Went crazy/loopy   Ciprofloxacin Hcl Itching, Nausea Only   Clavulanic Acid Other (See Comments)    Unknown   Crestor [rosuvastatin] Other (See Comments)   myalgia   Ezetimibe Diarrhea   Stomach cramps   Gluten Meal Other (See Comments)   Diarrhea, hot/cold sweat, will sleep for a couple of hours - Celiac disease   Lansoprazole Other (See Comments)   GI intolerance   Nitrofuran Derivatives Itching, Nausea Only   Phenergan [promethazine Hcl] Other (See Comments)   hallucinations   Wheat Other (See Comments)   Gi distress- celiac disease   Codeine Palpitations   Darvon [propoxyphene Hcl] Palpitations   Doxycycline Itching, Nausea Only        Medication List     TAKE these medications    acetaminophen 500 MG tablet Commonly known as: TYLENOL Take 1 tablet (500 mg total) by mouth every 6 (six) hours as needed for pain. What changed:  how much to take when to take this reasons to take this   amiodarone 200 MG tablet Commonly known as: PACERONE Take 1 tablet (200 mg total) by mouth daily. What changed:  when to take this   apixaban 5 MG Tabs tablet Commonly known as: ELIQUIS Take 1 tablet (5 mg total) by mouth 2 (two) times daily.   Artificial Tears 1.4 % ophthalmic solution Generic drug: polyvinyl alcohol Place 1 drop into both eyes as needed for dry eyes.   carvedilol 12.5 MG tablet Commonly known as: COREG Take 1 tablet (12.5 mg total) by mouth 2 (two) times daily. Replaces metoprolol   clidinium-chlordiazePOXIDE 5-2.5 MG capsule Commonly known as: LIBRAX Take 2 capsules by mouth daily as needed (cramping).   CRANBERRY-VITAMIN C PO Take 1 capsule by mouth in the morning.   diltiazem 360 MG 24 hr capsule Commonly known as: CARDIZEM CD Take 1 capsule (360 mg total) by mouth daily.   Dulcolax 400 MG/5ML suspension Generic drug: magnesium  hydroxide Take by mouth daily as needed for mild constipation.   esomeprazole 40 MG capsule Commonly known as: NEXIUM Take 40 mg by mouth daily before breakfast.   fenofibrate 54 MG tablet Take 54 mg by mouth daily.   FLEET LIQUID GLYCERIN SUPP RE Place 1 suppository rectally as needed (constipation).   hydrALAZINE 50 MG tablet Commonly known as: APRESOLINE Take 2 tablets (100 mg total) by mouth 3 (three) times daily.   isosorbide dinitrate 10 MG tablet Commonly known as: ISORDIL Take 10 mg by mouth 2 (two) times daily.   levothyroxine 100 MCG tablet Commonly known as: SYNTHROID Take 100 mcg by mouth daily before breakfast.   magnesium citrate Soln Take 1 Bottle by mouth once.   mineral oil enema Place 1 enema rectally as needed for severe constipation.   Oscimin 0.125 MG Subl Generic drug: Hyoscyamine Sulfate SL Take 0.125 mg by mouth as needed (cramping).   polyethylene glycol 17 g packet Commonly known as: MIRALAX / GLYCOLAX Take 17 g by mouth 2 (two) times daily. What changed: how much to take   RA SALINE ENEMA RE Place 1 Dose rectally as needed (constipation).   Rocklatan 0.02-0.005 % Soln Generic drug: Netarsudil-Latanoprost Place 1 drop into both eyes at bedtime.   senna-docusate 8.6-50 MG tablet Commonly known as: Senokot-S Take 2 tablets by mouth 2 (two) times daily. What changed:  how much to take when to take this   Vitamin D (Ergocalciferol) 1.25 MG (50000 UNIT) Caps capsule Commonly known as: DRISDOL Take 50,000 Units by mouth every 7 (seven) days.        Follow-up [facility] Schedule an appointment as soon as possible for a visit in 1 week(s).   Contact information: [address] [place] [address] [phone]                Allergies  Allergen Reactions   Cephalexin Other (See Comments)    Abdominal pain , GI distress, Abdominal pain , GI distress    Hydrocodone-Acetaminophen Other (See Comments) and Rash    Palpitations, flushing, hyperactivity, Chest pain, Chest pain   Ativan [Lorazepam] Other (See Comments)    "Knocked her out, woke up 3 days later"   Augmentin [Amoxicillin-Pot Clavulanate] Nausea And Vomiting   Avelox [Moxifloxacin] Itching and Nausea Only   Brimonidine Itching and Swelling    redness   Chlorzoxazone Other (See Comments)    Went crazy/loopy   Ciprofloxacin Hcl Itching and Nausea Only   Clavulanic Acid Other (See Comments)     Unknown   Crestor [Rosuvastatin] Other (See Comments)    myalgia   Ezetimibe Diarrhea  Stomach cramps   Gluten Meal Other (See Comments)    Diarrhea, hot/cold sweat, will sleep for a couple of hours - Celiac disease   Lansoprazole Other (See Comments)    GI intolerance   Nitrofuran Derivatives Itching and Nausea Only   Phenergan [Promethazine Hcl] Other (See Comments)    hallucinations   Wheat Other (See Comments)    Gi distress- celiac disease   Codeine Palpitations   Darvon [Propoxyphene Hcl] Palpitations   Doxycycline Itching and Nausea Only     Other Procedures/Studies: DG Abd 2 Views  Result Date: 07/19/2022 CLINICAL DATA:  Obstipation. Assess treatment response of bowel per John stool volumes. EXAM: ABDOMEN - 2 VIEW COMPARISON:  Abdominal radiograph and CT 07/17/2022 FINDINGS: No free intra-abdominal air. Air throughout the colon but no significant formed stool is seen. No small bowel distension or evidence of obstruction. IVC filter in place. Surgical clips in the upper abdomen. IMPRESSION: Air throughout the colon but no significant formed stool. No small bowel distention or small bowel obstruction. Electronically Signed   By: Keith Rake M.D.   On: 07/19/2022 14:31   DG CHEST PORT 1 VIEW  Result Date: 07/18/2022 CLINICAL DATA:  Sudden onset chest pain EXAM: PORTABLE CHEST 1 VIEW COMPARISON:  05/28/2018 FINDINGS: Check shadow is mildly enlarged but accentuated by the  portable technique. Aortic calcifications are noted. The lungs are well aerated bilaterally. Chronic blunting of left costophrenic angle is seen. Postsurgical changes in the shoulders are noted. IMPRESSION: No acute abnormality noted. Electronically Signed   By: Inez Catalina M.D.   On: 07/18/2022 02:29   DG Abd 2 Views  Result Date: 07/17/2022 CLINICAL DATA:  Constipation. EXAM: ABDOMEN - 2 VIEW COMPARISON:  Radiograph dated 05/28/2022. FINDINGS: Evaluation is limited due to body habitus. No significant colonic stool burden. There is air throughout the colon. An IVC filter is noted. Multiple surgical clips in the epigastric area. Degenerative changes of the spine. No acute osseous pathology. IMPRESSION: Nonobstructive bowel gas pattern. Electronically Signed   By: Anner Crete M.D.   On: 07/17/2022 22:28   CT ABDOMEN PELVIS WO CONTRAST  Result Date: 07/17/2022 CLINICAL DATA:  Constipation.  Abdominal pain. EXAM: CT ABDOMEN AND PELVIS WITHOUT CONTRAST TECHNIQUE: Multidetector CT imaging of the abdomen and pelvis was performed following the standard protocol without IV contrast. RADIATION DOSE REDUCTION: This exam was performed according to the departmental dose-optimization program which includes automated exposure control, adjustment of the mA and/or kV according to patient size and/or use of iterative reconstruction technique. COMPARISON:  CT 07/12/2022 and older FINDINGS: Lower chest: Heart is enlarged. Coronary artery calcifications are seen. Mild atelectasis at the lung bases. No pleural effusion. Hepatobiliary: Previous cholecystectomy. On this non IV contrast exam, grossly the liver is preserved. Pancreas: Mild atrophy of the pancreas.  No obvious pancreatic mass. Spleen: Normal in size without focal abnormality. Adrenals/Urinary Tract: Adrenal glands are preserved. No abnormal calcifications are seen within either kidney nor along the course of either ureter. Preserved contours of the urinary  bladder. There is a low-lying urinary bladder. Please correlate with prolapse. Stomach/Bowel: Once again there is a very large amount of diffuse colonic stool. Several loops of colon are ectatic as well measuring up to 8.5 cm. There is moderate debris in the stomach. Small bowel is nondilated. Surgical changes identified along loops of small bowel in the central pelvis above the bladder. Vascular/Lymphatic: Diffuse vascular calcifications are identified along the aorta and branch vessels. There an IVC filter in place  once again. The IVC is small in caliber at the level of the tip of the filter. No obvious abnormal lymph node enlargement seen on this noncontrast examination. Reproductive: Status post hysterectomy. No adnexal masses. Ovaries are poorly seen. Other: Mild anasarca.  No free air or free fluid Musculoskeletal: Curvature of the spine with degenerative changes. Degenerative changes seen of the hip joints as well. Osteopenia. IMPRESSION: Once again as on prior there is a large amount of diffuse colonic stool. No secondary changes of obstruction. Some of the loops of colon are ectatic measuring up to 8.5 cm. No free air or free fluid clearly seen. No obstructing renal stones. Low-lying urinary bladder.  Please correlate with pelvic prolapse. IVC filter. Enlarged heart Electronically Signed   By: Jill Side M.D.   On: 07/17/2022 11:14   CT ABDOMEN PELVIS W CONTRAST  Result Date: 07/12/2022 CLINICAL DATA:  Bowel obstruction, constipation, abdominal distention and discomfort, no bowel movement for 2 weeks, no relief with MiraLax, prune juice, Colace, Mag citrate EXAM: CT ABDOMEN AND PELVIS WITH CONTRAST TECHNIQUE: Multidetector CT imaging of the abdomen and pelvis was performed using the standard protocol following bolus administration of intravenous contrast. RADIATION DOSE REDUCTION: This exam was performed according to the departmental dose-optimization program which includes automated exposure control,  adjustment of the mA and/or kV according to patient size and/or use of iterative reconstruction technique. CONTRAST:  13mL OMNIPAQUE IOHEXOL 300 MG/ML SOLN IV. No oral contrast. COMPARISON:  06/22/2022 FINDINGS: Lower chest: Lung bases clear. Enlargement of cardiac chambers. Coronary arterial calcifications. Hepatobiliary: Gallbladder surgically absent. Mild intrahepatic and extrahepatic biliary dilatation, may be physiologic post cholecystectomy, correlate with LFTs. No focal hepatic masses. Pancreas: Normal appearance Spleen: Normal appearance Adrenals/Urinary Tract: Adrenal glands thickened without focal abnormality. Atrophic kidneys without mass or hydronephrosis. No ureteral calcification or dilatation. Low descent of bladder in pelvis consistent with cystocele. Stomach/Bowel: Increased stool in colon. Food debris in stomach. Small bowel loops decompressed. Appendix surgically absent by history. No bowel wall thickening or discrete point of obstruction. Vascular/Lymphatic: Atherosclerotic calcifications aorta, iliac arteries, visceral arteries. Aorta normal caliber. IVC filter noted, tip at the level of the LEFT renal vein. No adenopathy. Reproductive: Uterus surgically absent. Nonvisualization of ovaries. Other: No free air or free fluid. Surgical clips at GE junction region. No inflammatory process or hernia. Musculoskeletal: Osseous demineralization. IMPRESSION: Increased stool in colon. No evidence of bowel obstruction. Mild intrahepatic and extrahepatic biliary dilatation, may be physiologic post cholecystectomy, correlate with LFTs. Low descent of bladder in pelvis consistent with cystocele. Aortic Atherosclerosis (ICD10-I70.0). Electronically Signed   By: Lavonia Dana M.D.   On: 07/12/2022 19:55   CT RENAL STONE STUDY  Result Date: 06/22/2022 CLINICAL DATA:  Low back pain with burning with urination. Decreased appetite. Kidney stone suspected. EXAM: CT ABDOMEN AND PELVIS WITHOUT CONTRAST TECHNIQUE:  Multidetector CT imaging of the abdomen and pelvis was performed following the standard protocol without IV contrast. RADIATION DOSE REDUCTION: This exam was performed according to the departmental dose-optimization program which includes automated exposure control, adjustment of the mA and/or kV according to patient size and/or use of iterative reconstruction technique. COMPARISON:  Abdominopelvic CT 12/10/2021. FINDINGS: Lower chest: Clear lung bases. No significant pleural or pericardial effusion. Aortic and coronary artery atherosclerosis. Hepatobiliary: No focal liver lesion identified on noncontrast imaging. The liver has a stable appearance. Presumed remote cholecystectomy without significant biliary dilatation. Pancreas: Unremarkable. No pancreatic ductal dilatation or surrounding inflammatory changes. Spleen: Normal in size without focal abnormality. Adrenals/Urinary Tract:  Both adrenal glands appear normal. No evidence of urinary tract calculus, suspicious renal lesion or hydronephrosis. There is air throughout the bladder wall as well as the bladder lumen. No definite air seen within the ureters or intrarenal collecting systems. There are no perinephric inflammatory changes. Stomach/Bowel: No enteric contrast administered. Probable chronic postsurgical changes in the distal stomach and distal small bowel. The stomach is collapsed and suboptimally evaluated. Mildly prominent stool throughout the colon. No significant bowel distension, wall thickening, pneumatosis or surrounding inflammation identified. Vascular/Lymphatic: There are no enlarged abdominal or pelvic lymph nodes. Aortic and branch vessel atherosclerosis without evidence of aneurysm. 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 Reproductive: Hysterectomy. No evidence of adnexal mass. Probable pelvic floor laxity. Other: Postsurgical changes in the anterior abdominal wall. No hernia, pneumoperitoneum or significant ascites. Musculoskeletal: No  acute or significant osseous findings. Multilevel spondylosis associated with a convex left lumbar scoliosis. There are degenerative changes of both hips, greater on the right. IMPRESSION: 1. Emphysematous cystitis. 2. No evidence of urinary tract calculus or hydronephrosis. 3. No other acute abdominopelvic findings. 4. Probable chronic postsurgical changes in the stomach and distal small bowel. 5. Probable pelvic floor laxity. 6. Aortic Atherosclerosis (ICD10-I70.0) and Emphysema (ICD10-J43.9). Electronically Signed   By: Richardean Sale M.D.   On: 06/22/2022 11:45     TODAY-DAY OF DISCHARGE:  Subjective:   Amy Hickman today has no headache,no chest abdominal pain,no new weakness tingling or numbness, feels much better wants to go home today.   Objective:   Blood pressure (!) 152/73, pulse 68, temperature 98 F (36.7 C), temperature source Oral, resp. rate 17, height 5' (1.524 m), weight 71 kg, SpO2 97 %.  Intake/Output Summary (Last 24 hours) at 07/20/2022 0908 Last data filed at 07/19/2022 2251 Gross per 24 hour  Intake 3 ml  Output --  Net 3 ml   Filed Weights   07/17/22 1826  Weight: 71 kg    Exam: Awake Alert, Oriented *3, No new F.N deficits, Normal affect [place].AT,PERRAL Supple Neck,No JVD, No cervical lymphadenopathy appriciated.  Symmetrical Chest wall movement, Good air movement bilaterally, CTAB RRR,No Gallops,Rubs or new Murmurs, No Parasternal Heave +ve B.Sounds, Abd Soft, Non tender, No organomegaly appriciated, No rebound -guarding or rigidity. No Cyanosis, Clubbing or edema, No new Rash or bruise   PERTINENT RADIOLOGIC STUDIES: DG Abd 2 Views  Result Date: 07/19/2022 CLINICAL DATA:  Obstipation. Assess treatment response of bowel per John stool volumes. EXAM: ABDOMEN - 2 VIEW COMPARISON:  Abdominal radiograph and CT 07/17/2022 FINDINGS: No free intra-abdominal air. Air throughout the colon but no significant formed stool is seen. No small bowel distension or  evidence of obstruction. IVC filter in place. Surgical clips in the upper abdomen. IMPRESSION: Air throughout the colon but no significant formed stool. No small bowel distention or small bowel obstruction. Electronically Signed   By: Keith Rake M.D.   On: 07/19/2022 14:31     PERTINENT LAB RESULTS: CBC: Recent Labs    07/17/22 2137 07/18/22 0225  WBC 7.5 7.6  HGB 11.3* 11.9*  HCT 34.8* 36.3  PLT 180 188   CMET CMP     Component Value Date/Time   NA 136 07/19/2022 0623   NA 140 05/30/2021 1526   K 4.3 07/19/2022 0623   CL 105 07/19/2022 0623   CO2 19 (L) 07/19/2022 0623   GLUCOSE 94 07/19/2022 0623   BUN 9 07/19/2022 0623   BUN 34 (H) 05/30/2021 1526   CREATININE 1.09 (H) 07/19/2022 [id number]  CALCIUM 8.2 (L) 07/19/2022 0623   PROT 6.5 07/17/2022 0938   ALBUMIN 3.8 07/17/2022 0938   AST 28 07/17/2022 0938   ALT 33 07/17/2022 0938   ALKPHOS 49 07/17/2022 0938   BILITOT 0.3 07/17/2022 0938   GFRNONAA 50 (L) 07/19/2022 0623   GFRAA 54 (L) 01/22/2019 1405    GFR Estimated Creatinine Clearance: 33.8 mL/min (A) (by C-G formula based on SCr of 1.09 mg/dL (H)). No results for input(s): "LIPASE", "AMYLASE" in the last 72 hours. No results for input(s): "CKTOTAL", "CKMB", "CKMBINDEX", "TROPONINI" in the last 72 hours. Invalid input(s): "POCBNP" No results for input(s): "DDIMER" in the last 72 hours. No results for input(s): "HGBA1C" in the last 72 hours. No results for input(s): "CHOL", "HDL", "LDLCALC", "TRIG", "CHOLHDL", "LDLDIRECT" in the last 72 hours. Recent Labs    07/18/22 0225  TSH 7.679*   No results for input(s): "VITAMINB12", "FOLATE", "FERRITIN", "TIBC", "IRON", "RETICCTPCT" in the last 72 hours. Coags: Recent Labs    07/18/22 0225  INR 1.2   Microbiology: No results found for this or any previous visit (from the past 240 hour(s)).  FURTHER DISCHARGE INSTRUCTIONS:  Get Medicines reviewed and adjusted: Please take all your medications with you for  your next visit with your Primary MD  Laboratory/radiological data: Please request your Primary MD to go over all hospital tests and procedure/radiological results at the follow up, please ask your Primary MD to get all Hospital records sent to his/her office.  In some cases, they will be blood work, cultures and biopsy results pending at the time of your discharge. Please request that your primary care M.D. goes through all the records of your hospital data and follows up on these results.  Also Note the following: If you experience worsening of your admission symptoms, develop shortness of breath, life threatening emergency, suicidal or homicidal thoughts you must seek medical attention immediately by calling 911 or calling your MD immediately  if symptoms less severe.  You must read complete instructions/literature along with all the possible adverse reactions/side effects for all the Medicines you take and that have been prescribed to you. Take any new Medicines after you have completely understood and accpet all the possible adverse reactions/side effects.   Do not drive when taking Pain medications or sleeping medications (Benzodaizepines)  Do not take more than prescribed Pain, Sleep and Anxiety Medications. It is not advisable to combine anxiety,sleep and pain medications without talking with your primary care practitioner  Special Instructions: If you have smoked or chewed Tobacco  in the last 2 yrs please stop smoking, stop any regular Alcohol  and or any Recreational drug use.  Wear Seat belts while driving.  Please note: You were cared for by a hospitalist during your hospital stay. Once you are discharged, your primary care physician will handle any further medical issues. Please note that NO REFILLS for any discharge medications will be authorized once you are discharged, as it is imperative that you return to your primary care physician (or establish a relationship with a primary  care physician if you do not have one) for your post hospital discharge needs so that they can reassess your need for medications and monitor your lab values.  Total Time spent coordinating discharge including counseling, education and face to face time equals greater than 30 minutes.  SignedOren Binet 07/20/2022 9:08 AM

## 2022-07-19 NOTE — Discharge Instructions (Signed)
Recommendations for Outpatient Follow-up:  Follow up with PCP in 1-2 weeks Please obtain CMP/CBC in one week Please ensure follow-up with endocrinology, gastroenterology

## 2022-07-20 ENCOUNTER — Other Ambulatory Visit: Payer: Self-pay | Admitting: Internal Medicine

## 2022-07-20 DIAGNOSIS — K900 Celiac disease: Secondary | ICD-10-CM | POA: Diagnosis not present

## 2022-07-20 DIAGNOSIS — N3010 Interstitial cystitis (chronic) without hematuria: Secondary | ICD-10-CM | POA: Diagnosis not present

## 2022-07-20 DIAGNOSIS — Z9889 Other specified postprocedural states: Secondary | ICD-10-CM | POA: Diagnosis not present

## 2022-07-20 DIAGNOSIS — R110 Nausea: Secondary | ICD-10-CM

## 2022-07-20 DIAGNOSIS — K219 Gastro-esophageal reflux disease without esophagitis: Secondary | ICD-10-CM | POA: Diagnosis not present

## 2022-07-20 MED ORDER — APIXABAN 5 MG PO TABS
5.0000 mg | ORAL_TABLET | Freq: Two times a day (BID) | ORAL | Status: DC
  Administered 2022-07-20: 5 mg via ORAL
  Filled 2022-07-20: qty 1

## 2022-07-20 MED ORDER — POLYETHYLENE GLYCOL 3350 17 G PO PACK
17.0000 g | PACK | Freq: Every day | ORAL | Status: DC
  Administered 2022-07-20: 17 g via ORAL
  Filled 2022-07-20: qty 1

## 2022-07-20 MED ORDER — DILTIAZEM HCL ER COATED BEADS 180 MG PO CP24
180.0000 mg | ORAL_CAPSULE | Freq: Every day | ORAL | Status: DC
  Administered 2022-07-20: 180 mg via ORAL
  Filled 2022-07-20: qty 1

## 2022-07-20 MED ORDER — ONDANSETRON HCL 4 MG PO TABS: 4.0000 mg | ORAL_TABLET | Freq: Three times a day (TID) | ORAL | 0 refills | Status: DC | PRN

## 2022-07-20 MED ORDER — CARVEDILOL 25 MG PO TABS
25.0000 mg | ORAL_TABLET | Freq: Two times a day (BID) | ORAL | Status: DC
  Administered 2022-07-20: 25 mg via ORAL
  Filled 2022-07-20: qty 1

## 2022-07-20 NOTE — Evaluation (Signed)
Physical Therapy Evaluation Patient Details Name: Amy Hickman MRN: [id number] DOB: [birth year] Today's Date: 07/20/2022  History of Present Illness  PAtient is a 87 y/o female who presents on 3/29 with severe constipation, nausea and distended abdomen, found to have AKI. Recent admission 3/24-3/25 for same thing. PMH includes CHF, impaired hearing, HTN, osteoporosis, glaucoma, celiac disease, DVT, COPD, CKD, depression, aortic valve stenosis, CAD, Chronic A-fib.  Clinical Impression  Patient presents with generalized weakness, impaired balance and impaired mobility s/p above. Pt lives at home with her granddaughter and reports being Mod I for ADLs at baseline. Pt's family does IADLs and driving. Today, pt tolerated transfers and gait training with Min guard-supervision for safety. Did better with UE support for longer ambulation distance. Encouraged using RW at home as needed until balance improves. Pt is supposed to be doing her last week of HHPT so may need to continue based on  evaluation. Has support of granddaughter. Will follow acutely to maximize independence and mobility prior to return home.      Recommendations for follow up therapy are one component of a multi-disciplinary discharge planning process, led by the attending physician.  Recommendations may be updated based on patient status, additional functional criteria and insurance authorization.  Follow Up Recommendations       Assistance Recommended at Discharge PRN  Patient can return home with the following  Help with stairs or ramp for entrance;Assistance with cooking/housework;Assist for transportation    Equipment Recommendations None recommended by PT  Recommendations for Other Services       Functional Status Assessment Patient has had a recent decline in their functional status and demonstrates the ability to make significant improvements in function in a reasonable and predictable amount of time.     Precautions /  Restrictions Precautions Precautions: Fall Restrictions Weight Bearing Restrictions: No      Mobility  Bed Mobility Overal bed mobility: Modified Independent             General bed mobility comments: No assist needed.    Transfers Overall transfer level: Modified independent Equipment used: None               General transfer comment: Stood from EOB x1, from toilet x1.    Ambulation/Gait Ambulation/Gait assistance: Min guard, Supervision Gait Distance (Feet): 200 Feet Assistive device: Rolling walker (2 wheels) Gait Pattern/deviations: Step-through pattern, Decreased stride length, Drifts right/left Gait velocity: decreased Gait velocity interpretation: <1.8 ft/sec, indicate of risk for recurrent falls   General Gait Details: Slow, guarded gait- furniture walking in room progressing to use of RW for hallway ambulation. Hx of arthritis, so stifflike gait pattern initially.  Stairs            Wheelchair Mobility    Modified Rankin (Stroke Patients Only)       Balance Overall balance assessment: Needs assistance Sitting-balance support: Feet supported, No upper extremity supported Sitting balance-Leahy Scale: Good     Standing balance support: During functional activity Standing balance-Leahy Scale: Fair Standing balance comment: Able to stand and move short distances wihtout UE support but prefers UE support for longer walks at this time, stands at sink to wash hands supervision                             Pertinent Vitals/Pain Pain Assessment Pain Assessment: Faces Faces Pain Scale: Hurts little more Pain Location: arthritic pain, foot, hip chronic Pain Descriptors / Indicators: Discomfort Pain  Intervention(s): Monitored during session    Home Living Family/patient expects to be discharged to:: Private residence Living Arrangements: Other relatives (granddaughter) Available Help at Discharge: Family;Available  PRN/intermittently Type of Home: House Home Access: Stairs to enter Entrance Stairs-Rails: None Entrance Stairs-Number of Steps: 1 Alternate Level Stairs-Number of Steps: Chair lift between floors which pt uses consistently Home Layout: Multi-level Home Equipment: Cane - single point;Rollator (4 wheels);Electric scooter      Prior Function Prior Level of Function : Independent/Modified Independent             Mobility Comments: independent without DME; denies falls in the past few months. does not drive due to macular degeneration. very active with senior group at church. Granddaughter does IADLs, goes to gym ADLs Comments: independent with dressing and bathing     Hand Dominance   Dominant Hand: Right    Extremity/Trunk Assessment   Upper Extremity Assessment Upper Extremity Assessment: Defer to OT evaluation    Lower Extremity Assessment Lower Extremity Assessment: Generalized weakness (but functional)    Cervical / Trunk Assessment Cervical / Trunk Assessment: Kyphotic  Communication   Communication: HOH  Cognition Arousal/Alertness: Awake/alert Behavior During Therapy: WFL for tasks assessed/performed Overall Cognitive Status: Within Functional Limits for tasks assessed                                          General Comments General comments (skin integrity, edema, etc.): VSS on RA.    Exercises     Assessment/Plan    PT Assessment Patient needs continued PT services  PT Problem List Decreased strength;Decreased mobility;Decreased balance;Decreased activity tolerance       PT Treatment Interventions Therapeutic activities;Gait training;Therapeutic exercise;Patient/family education;Balance training;Functional mobility training    PT Goals (Current goals can be found in the Care Plan section)  Acute Rehab PT Goals Patient Stated Goal: to go home PT Goal Formulation: With patient Time For Goal Achievement: 08/03/22 Potential to  Achieve Goals: Good    Frequency Min 3X/week     Co-evaluation               AM-PAC PT "6 Clicks" Mobility  Outcome Measure Help needed turning from your back to your side while in a flat bed without using bedrails?: None Help needed moving from lying on your back to sitting on the side of a flat bed without using bedrails?: None Help needed moving to and from a bed to a chair (including a wheelchair)?: A Little Help needed standing up from a chair using your arms (e.g., wheelchair or bedside chair)?: A Little Help needed to walk in hospital room?: A Little Help needed climbing 3-5 steps with a railing? : A Little 6 Click Score: 20    End of Session Equipment Utilized During Treatment: Gait belt Activity Tolerance: Patient tolerated treatment well Patient left: in chair;with call bell/phone within reach Nurse Communication: Mobility status PT Visit Diagnosis: Unsteadiness on feet (R26.81);Difficulty in walking, not elsewhere classified (R26.2);Muscle weakness (generalized) (M62.81)    Time: [id number] PT Time Calculation (min) (ACUTE ONLY): 25 min   Charges:   PT Evaluation $PT Eval Low Complexity: 1 Low PT Treatments $Gait Training: 8-22 mins        Marisa Severin, PT, DPT Acute Rehabilitation Services Secure chat preferred Office [place] 07/20/2022, 9:21 AM

## 2022-07-20 NOTE — TOC Transition Note (Signed)
Transition of Care [facility]) - CM/SW Discharge Note   Patient Details  Name: Amy Hickman MRN: [id number] Date of Birth: 06-23-1935  Transition of Care [facility]) CM/SW Contact:  Levonne Lapping, RN Phone Number: 07/20/2022, 11:10 AM   Clinical Narrative:   Patient will dc to home today with Home Health from [facility].  Patient was current with [facility] for PT and SN prior to admission. She will resume services. Patient does have a wheelchair, RW and cane at home but denies needing these.  She has a chair lift on the flight of stairs in the home that is useful to her.  Her Granddaughter lives with her and assists patient with some ADL's due to her L eye blindness (per patient )  Granddaughter will transport Home.  No additional TOC needs identified               Patient Goals and CMS Choice      Discharge Placement                         Discharge Plan and Services Additional resources added to the After Visit Summary for                                       Social Determinants of Health (SDOH) Interventions SDOH Screenings   Food Insecurity: No Food Insecurity (07/17/2022)  Housing: Low Risk  (07/17/2022)  Transportation Needs: No Transportation Needs (07/17/2022)  Utilities: Not At Risk (07/17/2022)  Tobacco Use: Medium Risk (07/17/2022)     Readmission Risk Interventions     No data to display

## 2022-07-20 NOTE — Care Management Important Message (Signed)
Important Message  Patient Details  Name: Amy Hickman MRN: [id number] Date of Birth: 1935/10/22   Medicare Important Message Given:  Yes Patient left prior to IM delivery will mail a copy to the patient home address.     Angeline Trick 07/20/2022, 1:52 PM

## 2022-07-20 NOTE — Progress Notes (Signed)
Progress Note   Subjective  Chief Complaint: Constipation  Today, the patient explains that she did have a lot of watery bowel movements, apparently she was able to eat some of her breakfast this morning but if she ate too much or make her feel little "nauseous".  She asked what she is supposed to do when she leaves here because she does not want to get constipated again.  Apparently never had trouble like this in the past.  Tried everything at home prior to coming here.  Wants to make sure that everything continues to move and she is not in the situation again.  Aware that she has had multiple abdominal surgeries which are likely contributing.  Also tells me she would like to follow-up with our clinic at discharge.   Objective   Vital signs in last 24 hours: Temp:  [98 F (36.7 C)-98.3 F (36.8 C)] 98.2 F (36.8 C) (04/01 0909) Pulse Rate:  [65-79] 77 (04/01 0909) Resp:  [17-96] 96 (04/01 0909) BP: (102-213)/(55-99) 102/55 (04/01 0909) SpO2:  [92 %-98 %] 96 % (04/01 0909) Last BM Date : 07/19/22 General:    Elderly white female in NAD Heart:  Regular rate and rhythm; no murmurs Lungs: Respirations even and unlabored, lungs CTA bilaterally Abdomen:  Soft, nontender and nondistended. Normal bowel sounds. Psych:  Cooperative. Normal mood and affect.  Intake/Output from previous day: 03/31 0701 - 04/01 0700 In: 3 [I.V.:3] Out: -    Lab Results: Recent Labs    07/17/22 2137 07/18/22 0225  WBC 7.5 7.6  HGB 11.3* 11.9*  HCT 34.8* 36.3  PLT 180 188   BMET Recent Labs    07/17/22 2137 07/18/22 0040 07/18/22 0225 07/19/22 0623  NA 135  --  135 136  K 5.9* 5.0 4.6 4.3  CL 103  --  104 105  CO2 23  --  22 19*  GLUCOSE 116*  --  129* 94  BUN 14  --  13 9  CREATININE 1.30*  --  1.23* 1.09*  CALCIUM 8.7*  --  8.3* 8.2*   PT/INR Recent Labs    07/18/22 0225  LABPROT 14.7  INR 1.2    Studies/Results: DG Abd 2 Views  Result Date: 07/19/2022 CLINICAL DATA:   Obstipation. Assess treatment response of bowel per John stool volumes. EXAM: ABDOMEN - 2 VIEW COMPARISON:  Abdominal radiograph and CT 07/17/2022 FINDINGS: No free intra-abdominal air. Air throughout the colon but no significant formed stool is seen. No small bowel distension or evidence of obstruction. IVC filter in place. Surgical clips in the upper abdomen. IMPRESSION: Air throughout the colon but no significant formed stool. No small bowel distention or small bowel obstruction. Electronically Signed   By: Keith Rake M.D.   On: 07/19/2022 14:31      Assessment / Plan:   Assessment: 1.  Constipation: For the past couple of weeks in the setting of celiac disease and a normal bowel pattern of looser stool, admitted with bowel purge, CT scan with a large amount of stool throughout the colon, had good success with 1/2 gallon of GoLytely and KUB showed no obstructive bowel gas pattern no obvious dilation, is hypothyroid which could be contributing 2.  Celiac disease: Longstanding and patient compliant with gluten-free diet 3.  History of multiple abdominal surgeries in the past: 3 prior surgeries 4.  GERD 5.  Interstitial cystitis  Plan: 1.  Had a long discussion with the patient this morning in regards to her  constipation.  Encouraged her to stay on MiraLAX twice daily at discharge.  If she does not feel like she is having good bowel movements can increase up to 4 times a day if necessary.  Also discussed titrating back if she stays with diarrhea. 2.  Patient tells me she does not want to stay with [facility].  She wishes to be established with Dr. Hilarie Fredrickson and Nicoletta Ba who saw her here in the hospital. 3.  Will set up follow up for her in the next 3 to 4 weeks to discuss ongoing constipation in our clinic. 4.  Okay to discharge home today  Thank you for your kind consultation.  We will sign off.   LOS: 3 days   Levin Erp  07/20/2022, 11:07 AM

## 2022-07-20 NOTE — Care Management Important Message (Signed)
Important Message  Patient Details  Name: Amy Hickman MRN: [id number] Date of Birth: 1935-09-11   Medicare Important Message Given:  Yes     Orbie Pyo 07/20/2022, 1:44 PM

## 2022-07-20 NOTE — Evaluation (Signed)
Occupational Therapy Evaluation Patient Details Name: Amy Hickman MRN: [id number] DOB: 06-06-1935 Today's Date: 07/20/2022   History of Present Illness PAtient is a 87 y/o female who presents on 3/29 with severe constipation, nausea and distended abdomen, found to have AKI. Recent admission 3/24-3/25 for same thing. PMH includes CHF, impaired hearing, HTN, osteoporosis, glaucoma, celiac disease, DVT, COPD, CKD, depression, aortic valve stenosis, CAD, Chronic A-fib.   Clinical Impression   Pt present with decline in function and safety with ADLs and ADL mobility with impaired balance and endurance. PTA pt lived at home with her grand daughter and was Ind with ADLs/selfcare, IADLs, light home mgt and mobility. Pt would benefit from acute OT services to address impairments to maximize level of function and safety      Recommendations for follow up therapy are one component of a multi-disciplinary discharge planning process, led by the attending physician.  Recommendations may be updated based on patient status, additional functional criteria and insurance authorization.   Assistance Recommended at Discharge PRN  Patient can return home with the following A little help with bathing/dressing/bathroom;A little help with walking and/or transfers;Help with stairs or ramp for entrance;Assist for transportation;Assistance with cooking/housework    Functional Status Assessment  Patient has had a recent decline in their functional status and demonstrates the ability to make significant improvements in function in a reasonable and predictable amount of time.  Equipment Recommendations  None recommended by OT    Recommendations for Other Services       Precautions / Restrictions Precautions Precautions: Fall Restrictions Weight Bearing Restrictions: No      Mobility Bed Mobility Overal bed mobility: Modified Independent                  Transfers Overall transfer level: Modified  independent Equipment used: None                      Balance Overall balance assessment: Needs assistance Sitting-balance support: Feet supported, No upper extremity supported Sitting balance-Leahy Scale: Good     Standing balance support: During functional activity Standing balance-Leahy Scale: Fair                             ADL either performed or assessed with clinical judgement   ADL Overall ADL's : Needs assistance/impaired Eating/Feeding: Independent   Grooming: Wash/dry hands;Wash/dry face;Supervision/safety;Standing       Lower Body Bathing: Supervison/ safety   Upper Body Dressing : Set up   Lower Body Dressing: Supervision/safety   Toilet Transfer: Set up   Toileting- Clothing Manipulation and Hygiene: Supervision/safety       Functional mobility during ADLs: Modified independent       Vision Baseline Vision/History: 1 Wears glasses Patient Visual Report: No change from baseline       Perception     Praxis      Pertinent Vitals/Pain Pain Assessment Pain Assessment: No/denies pain Faces Pain Scale: No hurt     Hand Dominance Right   Extremity/Trunk Assessment Upper Extremity Assessment Upper Extremity Assessment: Overall WFL for tasks assessed   Lower Extremity Assessment Lower Extremity Assessment: Generalized weakness (but functional)   Cervical / Trunk Assessment Cervical / Trunk Assessment: Kyphotic   Communication Communication Communication: HOH   Cognition Arousal/Alertness: Awake/alert Behavior During Therapy: WFL for tasks assessed/performed Overall Cognitive Status: Within Functional Limits for tasks assessed  General Comments  VSS on RA.    Exercises     Shoulder Instructions      Home Living Family/patient expects to be discharged to:: Private residence Living Arrangements: Other relatives Available Help at Discharge: Family;Available  PRN/intermittently Type of Home: House Home Access: Stairs to enter Entrance Stairs-Number of Steps: 1 Entrance Stairs-Rails: None Home Layout: Multi-level Alternate Level Stairs-Number of Steps: Chair lift between floors which pt uses consistently   Bathroom Shower/Tub: Occupational psychologist: Standard Bathroom Accessibility: Yes   Home Equipment: Cane - single point;Rollator (4 wheels);Electric scooter          Prior Functioning/Environment Prior Level of Function : Independent/Modified Independent             Mobility Comments: independent without DME; denies falls in the past few months. does not drive due to macular degeneration. very active with senior group at church. Granddaughter does IADLs, goes to gym ADLs Comments: independent with dressing and bathing, does ont drive, grand daughter cooks        OT Problem List: Decreased activity tolerance      OT Treatment/Interventions:      OT Goals(Current goals can be found in the care plan section) Acute Rehab OT Goals Patient Stated Goal: go home OT Goal Formulation: With patient Time For Goal Achievement: 08/03/22 ADL Goals Pt Will Perform Lower Body Bathing: with set-up;with modified independence Pt Will Perform Lower Body Dressing: with set-up;with modified independence Pt Will Transfer to Toilet: Independently Pt Will Perform Tub/Shower Transfer: with supervision;with modified independence;ambulating  OT Frequency:      Co-evaluation              AM-PAC OT "6 Clicks" Daily Activity     Outcome Measure Help from another person eating meals?: None Help from another person taking care of personal grooming?: A Little Help from another person toileting, which includes using toliet, bedpan, or urinal?: A Little Help from another person bathing (including washing, rinsing, drying)?: A Little Help from another person to put on and taking off regular upper body clothing?: None Help from another  person to put on and taking off regular lower body clothing?: A Little 6 Click Score: 20   End of Session Equipment Utilized During Treatment: Other (comment) (BSC)  Activity Tolerance: Patient tolerated treatment well Patient left: in bed;with call bell/phone within reach;with nursing/sitter in room  OT Visit Diagnosis: Unsteadiness on feet (R26.81)                Time: [id number] OT Time Calculation (min): 19 min Charges:  OT General Charges $OT Visit: 1 Visit OT Evaluation $OT Eval Low Complexity: 1 Low    Britt Bottom 07/20/2022, 10:46 AM

## 2022-07-20 NOTE — Progress Notes (Signed)
Discharge was held yesterday as patient did not feel comfortable-apparently she still had some loose stools.  Daughter had called answering service of gastroenterology.  After discussion with GI-we had obtained a x-ray of the abdomen that did not show any major issues.  Seen and examined this morning-she appears very comfortable.  No further BM overnight.  Her last BM was yesterday evening/afternoon.  She has ambulated in the hallway with physical therapy.  Exam is benign  Remains stable to be discharged-she wants to talk with GI before she leaves the hospital.  I have alerted [facility] gastroenterology.  Probable discharge later today.

## 2022-07-21 ENCOUNTER — Telehealth: Payer: Self-pay

## 2022-07-21 NOTE — Telephone Encounter (Signed)
Patient has been scheduled for a 3-week hospital follow up with Nicoletta Ba, PA-C on Tuesday, 08/11/22 at 1:30 pm. Called and provided patient with her appt information. Pt verbalized understanding and had no concerns at the end of the call.

## 2022-07-21 NOTE — Telephone Encounter (Signed)
-----   Message from Levin Erp, [place] sent at 07/20/2022 11:07 AM EDT ----- Regarding: needs follow up appt Patient needs follow-up with Dr. Hilarie Fredrickson or Nicoletta Ba in clinic in the next 3 to 4 weeks for constipation.  Of note she was previously followed by another GI clinic but requested to follow-up with [place] after this hospitalization.  Thanks, JL L

## 2022-07-25 NOTE — Progress Notes (Signed)
Change in bowel pattern. Constipation. Concern re: obstructing mass. CEA tumor marker for colon cancer ordered

## 2022-07-27 ENCOUNTER — Encounter (HOSPITAL_BASED_OUTPATIENT_CLINIC_OR_DEPARTMENT_OTHER): Payer: Self-pay | Admitting: Cardiology

## 2022-07-27 ENCOUNTER — Ambulatory Visit (INDEPENDENT_AMBULATORY_CARE_PROVIDER_SITE_OTHER): Payer: Medicare Other | Admitting: Cardiology

## 2022-07-27 VITALS — BP 124/60 | HR 64 | Ht 60.0 in | Wt 146.1 lb

## 2022-07-27 DIAGNOSIS — I480 Paroxysmal atrial fibrillation: Secondary | ICD-10-CM

## 2022-07-27 DIAGNOSIS — N1832 Chronic kidney disease, stage 3b: Secondary | ICD-10-CM | POA: Diagnosis not present

## 2022-07-27 DIAGNOSIS — I10 Essential (primary) hypertension: Secondary | ICD-10-CM

## 2022-07-27 DIAGNOSIS — Z7901 Long term (current) use of anticoagulants: Secondary | ICD-10-CM

## 2022-07-27 DIAGNOSIS — I5042 Chronic combined systolic (congestive) and diastolic (congestive) heart failure: Secondary | ICD-10-CM | POA: Diagnosis not present

## 2022-07-27 DIAGNOSIS — D6869 Other thrombophilia: Secondary | ICD-10-CM

## 2022-07-27 DIAGNOSIS — I428 Other cardiomyopathies: Secondary | ICD-10-CM

## 2022-07-27 NOTE — Progress Notes (Signed)
Cardiology Office Note:    Date:  07/27/2022   ID:  Amy Hickman, DOB 05-Nov-1935, MRN [id number]  PC[facility]:  [facility]  Cardiologist:  Jodelle RedBridgette Avon Mergenthaler, MD  Referring MD: [facility]   CC: follow up  History of Present Illness:    Amy AbedCarol A Hickman is a 87 y.o. female with a hx of persistent atrial fibrillation, hypertension, OSA on CPAP, CAD, CKD, GERD, COPD, and celiac disease who is seen for follow up today. I met her during her admission 03/17/21 for the evaluation and management of atrial fibrillation.  History: Admission 03/15/21 for sepsis in the setting of influenza A, found to have afib RVR. EF slightly reduced. Difficult to rate control, required both metoprolol and diltiazem. Cardioversion 04/24/2021 got her into sinus rhythm, but she then returned to atrial fibrillation. Started on amiodarone, repeat DCCV 06/12/21 with restoration of sinus rhythm.   She presented to the ED on 12/22/2021 for left chest pain. She was experiencing chest pain when attempting to breathe hard. This pain has become worse in the last week. She took 6 NTG with transient improvement before she got hot and nauseated. Troponin was negatve x2. Chest X-ray negative. Admission to the hospital was recommended by cardiology for further work up.   At her last appointment, she brought her BP log from home with readings ranging 140/60-184/99. Went to ER the day prior for UTI; still felt uncomfortable. Took a while for her to get carvedilol in the mail, taking for about a week or less. She denied feeling Afib in a while. Weight stable on home scale, ranged 143-145 lbs. Had PT coming to the house. Reviewed meds and monitor results. We increased her hydralazine to 100 mg TID.  Today, she is accompanied by her daughter. She had been admitted to the hospital 06/2022 with constipation and AKI. She reports that after 3 days of treatment her bowel movements improved. On 4/26 she  will follow-up with GI. Her regimen includes Miralax, Dulcolax, and Senokot.  Her daughter notes that she was not given her antihypertensives in the hospital and her blood pressure elevated significantly. Otherwise, her blood pressure has been well controlled at home. In clinic today her blood pressure is 124/60. Heart rates have also been stable.  Her thyroid medication was recently increased as well.  She denies any palpitations, chest pain, shortness of breath, or peripheral edema. No lightheadedness, headaches, syncope, orthopnea, or PND.   Past Medical History:  Diagnosis Date   Atrial fibrillation, chronic    s/p multiple cardioversions with sustained NSR, on Eliquis   Celiac disease    Chronic systolic CHF (congestive heart failure)    Coronary artery disease involving native coronary artery of native heart without angina pectoris 03/15/2021   GERD (gastroesophageal reflux disease)    HOH (hard of hearing)    Hyperparathyroidism 12/12/2020   Hypertension    Interstitial cystitis     Past Surgical History:  Procedure Laterality Date   ABDOMINAL HYSTERECTOMY     APPENDECTOMY     CARDIOVERSION N/A 04/24/2021   Procedure: CARDIOVERSION;  Surgeon: Jake BatheSkains, Mark C, MD;  Location: [facility] ENDOSCOPY;  Service: Cardiovascular;  Laterality: N/A;   CARDIOVERSION N/A 06/12/2021   Procedure: CARDIOVERSION;  Surgeon: Jodelle Redhristopher, Sydnie Sigmund, MD;  [facility];  Service: Cardiovascular;  Laterality: N/A;   CARDIOVERSION N/A 08/11/2021   Procedure: CARDIOVERSION;  Surgeon: Meriam SpraguePemberton, Heather E, MD;  [facility];  Service: Cardiovascular;  Laterality: N/A;   CHOLECYSTECTOMY  ear drum surgery     REPLACEMENT TOTAL KNEE     ROTATOR CUFF REPAIR Right    TONSILLECTOMY      Current Medications: Current Outpatient Medications on File Prior to Visit  Medication Sig   acetaminophen (TYLENOL) 500 MG tablet Take 1 tablet (500 mg total) by mouth every 6 (six) hours as needed for  pain. (Patient taking differently: Take 1,000 mg by mouth as needed for pain or moderate pain.)   amiodarone (PACERONE) 200 MG tablet Take 1 tablet (200 mg total) by mouth daily. (Patient taking differently: Take 200 mg by mouth 2 (two) times daily.)   apixaban (ELIQUIS) 5 MG TABS tablet Take 1 tablet (5 mg total) by mouth 2 (two) times daily.   carvedilol (COREG) 12.5 MG tablet Take 1 tablet (12.5 mg total) by mouth 2 (two) times daily. Replaces metoprolol   clidinium-chlordiazePOXIDE (LIBRAX) 5-2.5 MG capsule Take 2 capsules by mouth daily as needed (cramping).   CRANBERRY-VITAMIN C PO Take 1 capsule by mouth in the morning.   diltiazem (CARDIZEM CD) 360 MG 24 hr capsule Take 1 capsule (360 mg total) by mouth daily.   esomeprazole (NEXIUM) 40 MG capsule Take 40 mg by mouth daily before breakfast.   fenofibrate 54 MG tablet Take 54 mg by mouth daily.   Glycerin, Laxative, (FLEET LIQUID GLYCERIN SUPP RE) Place 1 suppository rectally as needed (constipation).   hydrALAZINE (APRESOLINE) 50 MG tablet Take 2 tablets (100 mg total) by mouth 3 (three) times daily.   Hyoscyamine Sulfate SL (OSCIMIN) 0.125 MG SUBL Take 0.125 mg by mouth as needed (cramping).   isosorbide dinitrate (ISORDIL) 10 MG tablet Take 10 mg by mouth 2 (two) times daily.   levothyroxine (SYNTHROID) 112 MCG tablet Take 112 mcg by mouth daily before breakfast.   magnesium hydroxide (DULCOLAX) 400 MG/5ML suspension Take by mouth daily as needed for mild constipation.   mineral oil enema Place 1 enema rectally as needed for severe constipation.   Netarsudil-Latanoprost (ROCKLATAN) 0.02-0.005 % SOLN Place 1 drop into both eyes at bedtime.   ondansetron (ZOFRAN) 4 MG tablet Take 1 tablet (4 mg total) by mouth every 8 (eight) hours as needed for nausea or vomiting.   polyethylene glycol (MIRALAX / GLYCOLAX) 17 g packet Take 17 g by mouth 2 (two) times daily. (Patient taking differently: Take 68 g by mouth 2 (two) times daily.)   polyvinyl  alcohol (ARTIFICIAL TEARS) 1.4 % ophthalmic solution Place 1 drop into both eyes as needed for dry eyes.   senna-docusate (SENOKOT-S) 8.6-50 MG tablet Take 2 tablets by mouth 2 (two) times daily. (Patient taking differently: Take 1 tablet by mouth daily.)   Sodium Phosphates (RA SALINE ENEMA RE) Place 1 Dose rectally as needed (constipation).   Vitamin D, Ergocalciferol, (DRISDOL) 1.25 MG (50000 UNIT) CAPS capsule Take 50,000 Units by mouth every 7 (seven) days.   No current facility-administered medications on file prior to visit.     Allergies:   Cephalexin, Hydrocodone-acetaminophen, Ativan [lorazepam], Augmentin [amoxicillin-pot clavulanate], Avelox [moxifloxacin], Brimonidine, Chlorzoxazone, Ciprofloxacin hcl, Clavulanic acid, Crestor [rosuvastatin], Ezetimibe, Gluten meal, Lansoprazole, Nitrofuran derivatives, Phenergan [promethazine hcl], Wheat, Codeine, Darvon [propoxyphene hcl], and Doxycycline   Social History   Tobacco Use   Smoking status: Former    Packs/day: 2.00    Years: 33.00    Additional pack years: 0.00    Total pack years: 66.00    Types: Cigarettes    Quit date: 1981    Years since quitting: 30.2  Smokeless tobacco: Never  Substance Use Topics   Alcohol use: Not Currently    Comment: rare   Drug use: No    Family History: family history includes Hypertension in an other family member.  ROS:   Please see the history of present illness. All other systems are reviewed and negative.    EKGs/Labs/Other Studies Reviewed:    The following studies were reviewed today:  Monitor 05/2022 Patch Wear Time:  7 days and 3 hours (2024-01-03T16:15:35-0500 to 2024-01-10T19:36:55-0500)  Patient had a min HR of 49 bpm, max HR of 95 bpm, and avg HR of 60 bpm. Predominant underlying rhythm was Sinus Rhythm. 1 run of Supraventricular Tachycardia occurred lasting 6 beats with a max rate of 95 bpm (avg 88 bpm). No VT, atrial fibrillation, high degree block, or pauses noted.  Isolated atrial and ventricular ectopy was rare (<1%). There were 5 triggered events, which were largely sinus (a few with PVC). No significant arrhythmias detected.   Abdominal US 02/17/22: Liver:  No focal lesion identified. Within normal limits in parenchymal  echogenicity. Portal vein is patent on color Doppler imaging with  normal direction of blood flow towards the liver.  IMPRESSION:  1. The common bile duct measures 9 mm which is normal for the  patient's age and status post cholecystectomy.  2. No other abnormalities.   Echo 12/24/2021: 1. Left ventricular ejection fraction, by estimation, is 40 to 45%. The  left ventricle has mildly decreased function. The left ventricle  demonstrates global hypokinesis. There is mild concentric left ventricular  hypertrophy. Left ventricular diastolic  parameters are indeterminate.   2. Right ventricular systolic function is normal. The right ventricular  size is normal.   3. Left atrial size was moderately dilated.   4. The mitral valve is normal in structure. No evidence of mitral valve  regurgitation. No evidence of mitral stenosis.   5. The Aortic valve is thickened and moderately calcified. . There is  mild calcification of the aortic valve. There is mild thickening of the  aortic valve. Aortic valve regurgitation is moderate. Suspect low flow low  gradient moderate aortic stenosis.  Aortic valve area, by VTI measures 1.09 cm. Aortic valve mean gradient  measures 11.0 mmHg. Aortic valve Vmax measures 2.02 m/s.   6. The inferior vena cava is normal in size with greater than 50%  respiratory variability, suggesting right atrial pressure of 3 mmHg.   Comparison(s): No significant change from prior study.    CT Chest 05/18/2021 [facility]): COMPARISON:  04/02/2021   FINDINGS:  Cardiovascular: Heart top-normal in size. Three-vessel coronary  artery calcifications. No pericardial effusion. Aortic  atherosclerosis.    Mediastinum/Nodes: No neck base, mediastinal or hilar masses or  enlarged lymph nodes. Trachea and esophagus are unremarkable.   Lungs/Pleura: No evidence of pulmonary contusion or laceration. No  no lung consolidation to suggest pneumonia and no evidence of  pulmonary edema. Several reticulonodular type opacities, most  evident in the anterior right middle lobe, are stable, benign. There  are no masses or suspicious nodules. Mild paraseptal emphysema is  noted posteriorly, also unchanged.   No pleural effusion or pneumothorax.   Upper Abdomen: No acute abnormality.   Musculoskeletal: No fracture or acute finding. Bilateral  well-positioned shoulder prostheses. No bone lesions. No chest wall  mass or contusion.   IMPRESSION:  1. No acute findings or evidence of injury to the chest. No  fractures.  2. Aortic atherosclerosis and coronary artery calcifications.  Aortic Atherosclerosis (ICD10-I70.0) and Emphysema (ICD10-J43.9).   CT Chest 04/02/2021 [facility]): COMPARISON:  Chest x-rays from November of 2022.   FINDINGS:  Cardiovascular: Calcified atherosclerosis of the thoracic aorta.  Calcified coronary artery disease, three-vessel coronary artery  disease. No aortic dilation. Heart size moderately enlarged, no  substantial pericardial effusion. Central pulmonary vasculature is  normal caliber. Limited assessment of cardiovascular structures  given lack of intravenous contrast.   Mediastinum/Nodes: Patulous esophagus. No thoracic inlet or axillary  adenopathy though streak artifact from bilateral reverse shoulder  arthroplasties limits assessment in the axillary regions and at the  thoracic inlet. No mediastinal or hilar adenopathy.   Lungs/Pleura: Basilar atelectasis at the LEFT lung base. No  effusion. No consolidation. 3 mm RIGHT middle lobe pulmonary nodule  (image 101/3). Airways are patent.   Upper Abdomen: Postoperative changes about the GE  junction with  surgical clips in this area. No acute findings relative to  visualized liver, pancreas, spleen or very limited visualization of  other upper abdominal structures.   Musculoskeletal: Post bilateral reverse shoulder arthroplasty. No  acute musculoskeletal process. No destructive bone finding. Spinal  degenerative changes.   IMPRESSION:  No acute findings in the chest.   3 mm RIGHT middle lobe pulmonary nodule. No follow-up needed if  patient is low-risk. Non-contrast chest CT can be considered in 12  months if patient is high-risk. This recommendation follows the  consensus statement: Guidelines for Management of Incidental  Pulmonary Nodules Detected on CT Images: From the Fleischner Society  2017; Radiology 2017; 284:228-243.   Three-vessel coronary artery disease.   Postoperative changes about the GE junction.   Aortic atherosclerosis.   Echo 03/16/21 1. Left ventricular ejection fraction, by estimation, is 40 to 45%. The  left ventricle has mildly decreased function. The left ventricle  demonstrates global hypokinesis. Left ventricular diastolic parameters are  indeterminate.   2. Right ventricular systolic function is mildly reduced. The right  ventricular size is normal. There is moderately elevated pulmonary artery  systolic pressure. The estimated right ventricular systolic pressure is  46.8 mmHg.   3. Left atrial size was moderately dilated.   4. Right atrial size was severely dilated.   5. The mitral valve is normal in structure. Mild to moderate mitral valve  regurgitation.   6. Tricuspid valve regurgitation is moderate.   7. The aortic valve is tricuspid. There is severe calcifcation of the  aortic valve. Aortic valve regurgitation is mild. Moderate aortic valve  stenosis. Gradients consistent with mild AS (MG , Vmax 2.2 m/s), but moderate by AVA (1.0cm^2) and DI  (0.32). Low SV index, suspect low flow low gradient moderate AS   8. The  inferior vena cava is dilated in size with <50% respiratory  variability, suggesting right atrial pressure of 15 mmHg.   EKG:  EKG is personally reviewed.   07/27/2022:  EKG was not ordered. 06/23/22: SR at 69 bpm with single PVC 02/20/22: sinus bradycardia at 56 bpm 01/05/22: Sinus bradycardia. Rate 56 bpm. 05/30/2021: atrial flutter at 77 bpm 05/09/2021: atrial fibrillation at 83 bpm 04/08/21: atrial fibrillation at 86 bpm  Recent Labs: 03/18/2022: B Natriuretic Peptide 187.8; Magnesium 2.2 07/17/2022: ALT 33 07/18/2022: Hemoglobin 11.9; Platelets 188; TSH 7.679 07/19/2022: BUN 9; Creatinine, Ser 1.09; Potassium 4.3; Sodium 136   Recent Lipid Panel No results found for: "CHOL", "TRIG", "HDL", "CHOLHDL", "VLDL", "LDLCALC", "LDLDIRECT"  Physical Exam:    VS:  BP 124/60 (BP Location: Left Arm, Patient Position: Sitting, Cuff  Size: Normal)   Pulse 64   Ht 5' (1.524 m)   Wt 146 lb 1.6 oz (66.3 kg)   SpO2 97%   BMI 28.53 kg/m     Wt Readings from Last 3 Encounters:  07/27/22 146 lb 1.6 oz (66.3 kg)  07/17/22 156 lb 8.4 oz (71 kg)  07/12/22 145 lb 4.5 oz (65.9 kg)    GEN: Well nourished, well developed in no acute distress HEENT: Normal, moist mucous membranes NECK: No JVD CARDIAC: regular rhythm, normal S1 and S2, no rubs or gallops. 2/6 systolic murmur. VASCULAR: Radial and DP pulses 2+ bilaterally. No carotid bruits RESPIRATORY:  Clear to auscultation without rales, wheezing or rhonchi  ABDOMEN: Soft, non-tender, non-distended MUSCULOSKELETAL:  Ambulates independently SKIN: Warm and dry, no significant edema NEUROLOGIC:  Alert and oriented x 3. No focal neuro deficits noted. PSYCHIATRIC:  Normal affect     ASSESSMENT:    1. Primary hypertension   2. Stage 3b chronic kidney disease   3. PAF (paroxysmal atrial fibrillation)   4. Chronic combined systolic and diastolic heart failure   5. Secondary hypercoagulable state   6. NICM (nonischemic cardiomyopathy)   7. Long term  current use of anticoagulant     PLAN:    Chronic kidney disease, stage 3b vs stage 4 Hypertension -ARB held during prior hospitalization -continue hydralazine 100 mg TID -continue carvedilol, imdur -followed by nephrology -next steps would be to change diltiazem to amlodipine, follow HR. Diltiazem can also cause constipation <2% of the time, so if this is not improved on close follow up, would be another reason to try to come off diltiazem -clonidine would be last step  Paroxysmal atrial fibrillation/atrial flutter Chronic systolic and diastolic heart failure Nonischemic cardiomyopathy -EF 40-45%. Not significantly changed with holding sinus rhythm -Cath with nonobstructive CAD -tolerating carvedilol -would prefer not to use diltiazem given low EF, but was not rate controlled on beta blocker alone. Will continue diltiazem for now, consider changing to amlodipine and increasing carvedilol dose in the future if needed -CHA2DS2/VAS Stroke Risk Points=8  -continue apixaban 5 mg BID -tolerating amiodarone  OSA: continue CPAP  Moderate aortic stenosis Mild-moderate MR -last echo 12/2021, follow annually or for change in symptoms  CAD, nonobstructive, without angina -given age, continue apixaban and no aspirin -continue atorvastatin  Plan for follow up: 4 weeks, or sooner as needed.   Jodelle Red, MD, PhD, [facility] [place]  [facility]  [place]  [facility] at [facility] [address], [address] [place], [place] [address] [phone]   Medication Adjustments/Labs and Tests Ordered: Current medicines are reviewed at length with the patient today.  Concerns regarding medicines are outlined above.   No orders of the defined types were placed in this encounter.  No orders of the defined types were placed in this encounter.  Patient Instructions  Medication Instructions:  Your physician recommends that you  continue on your current medications as directed. Please refer to the Current Medication list given to you today.   Labwork: NONE  Testing/Procedures: NONE  Follow-Up: 08/20/2022 9:20 AM WITH DR Sayra Frisby        I,Mathew Stumpf,acting as a scribe for Jodelle Red, MD.,have documented all relevant documentation on the behalf of Jodelle Red, MD,as directed by  Jodelle Red, MD while in the presence of Jodelle Red, MD.  I, Jodelle Red, MD, have reviewed all documentation for this visit. The documentation on 07/27/22 for the exam, diagnosis, procedures, and  orders are all accurate and complete.  Signed, Jodelle Red, MD PhD 07/27/2022    [facility]

## 2022-07-27 NOTE — Patient Instructions (Signed)
Medication Instructions:  Your physician recommends that you continue on your current medications as directed. Please refer to the Current Medication list given to you today.   Labwork: NONE  Testing/Procedures: NONE  Follow-Up: 08/20/2022 9:20 AM WITH DR Cristal Deer

## 2022-08-11 ENCOUNTER — Encounter: Payer: Self-pay | Admitting: Physician Assistant

## 2022-08-11 ENCOUNTER — Ambulatory Visit (INDEPENDENT_AMBULATORY_CARE_PROVIDER_SITE_OTHER): Payer: Medicare Other | Admitting: Physician Assistant

## 2022-08-11 ENCOUNTER — Other Ambulatory Visit (INDEPENDENT_AMBULATORY_CARE_PROVIDER_SITE_OTHER): Payer: Medicare Other

## 2022-08-11 VITALS — BP 148/70 | HR 64 | Ht 60.0 in | Wt 143.1 lb

## 2022-08-11 DIAGNOSIS — K5900 Constipation, unspecified: Secondary | ICD-10-CM

## 2022-08-11 LAB — BASIC METABOLIC PANEL
BUN: 19 mg/dL (ref 6–23)
CO2: 28 mEq/L (ref 19–32)
Calcium: 9.8 mg/dL (ref 8.4–10.5)
Chloride: 100 mEq/L (ref 96–112)
Creatinine, Ser: 1.49 mg/dL — ABNORMAL HIGH (ref 0.40–1.20)
GFR: 32.09 mL/min — ABNORMAL LOW (ref >60.00)
Glucose, Bld: 98 mg/dL (ref 70–99)
Potassium: 4.7 mEq/L (ref 3.5–5.1)
Sodium: 134 mEq/L — ABNORMAL LOW (ref 135–145)

## 2022-08-11 LAB — MAGNESIUM: Magnesium: 2.2 mg/dL (ref 1.5–2.5)

## 2022-08-11 MED ORDER — PEG 3350-KCL-NA BICARB-NACL 420 G PO SOLR: 4000.0000 mL | Freq: Once | ORAL | 0 refills | Status: AC

## 2022-08-11 NOTE — Progress Notes (Signed)
Subjective:    Patient ID: Amy Hickman, female    DOB: 1935/08/17, 87 y.o.   MRN: [id number]  HPI Amy Hickman is a pleasant 87 year old white female, who comes in today for post hospital follow-up.  She was initially seen by our service during recent hospitalization at [facility] 3/29 through 07/19/2022 by Dr. Rhea Hickman and myself. She was seen at that time for severe obstipation.  She has a long history of celiac disease, and has had numerous prior abdominal surgeries including repair of intussusception as a child, appendectomy, hysterectomy, history of bowel obstructions requiring laparotomy and resections and 1 remote surgery that required an open wound closure.  She also has history of congestive heart failure, coronary artery disease, atrial fibrillation for which she is on Eliquis, hyperparathyroidism hypertension and interstitial cystitis. Up until the past couple of months she had tended towards regular bowel movements or loose stools but in the past has had an occasional bad episode of constipation.  At the time she was hospitalized she had had trouble over the prior 2 to 3 weeks.  She had been seen by Amy Hickman with whom she had followed within the past and was told to take MiraLAX and prune juice or try a bottle of mag citrate.  None of those suggestions worked and she welled up hospitalized overnight the week prior to this admission and was given MiraLAX and Gatorade with about 4 doses and then discharged to home asked to continue MiraLAX daily.  She returned to the hospital a couple of days later saying that she had not had any further bowel movements for about 5 days and had developed abdominal distention and discomfort.  Enemas were given in the emergency room without much result, and no evidence of impaction. CT imaging showed a large amount of stool throughout the colon and dilated loops of colon measuring up to 8.5 cm, she had a somewhat ectatic appearing colon no dilated small bowel, IVC  filter in place and a low-lying bladder. No significant electrolyte disturbances, TSH 7.67. She was given a bowel prep with a gallon of GoLytely, and had good results with that over 2 days.  On discharge she was asked to continue MiraLAX 17 g in 8 ounces of water twice daily, and to use Dulcolax or Senokot in addition as needed.  She comes in today here stating that over the past couple of weeks despite taking MiraLAX twice daily, 1 or 2 Senokot daily and Dulcolax liquid daily she is still not having regular bowel movements over the past 2 weeks.  She started to feel a bit distended, no nausea or vomiting, has been able to eat.  Her last bowel movement was 5 days ago, with passage of a very small amount of stool 4 days ago.  The only change she has had medications over the past couple of months was an increase in Cardizem from twice daily dosing to 3 times daily dosing.  She has been seen by her cardiologist since discharge from the hospital/Dr. Jodelle Hickman who is aware of her issues with obstipation, and has considered stopping the Cardizem and can consider changing to amlodipine, but wanted to wait for our opinion.  Again patient had previously been followed by [facility] over the past several years, Dr. Loman Hickman, and those notes are visible in care everywhere.  She had undergone an EGD in June 2022 for complaints of epigastric pain and dysphagia with finding of a benign stricture with a diameter 14 mm in  the upper third of the esophagus, this was [facility] Hickman dilated to 43 [place], biopsies from the esophagus were negative for eosinophilic esophagitis, she had mild patchy gastritis, the duodenum appeared normal and was not biopsied.  She had colonoscopy and EGD in January 2021, at that time EGD was normal and a colonoscopy had a 2 mm adenoma removed and internal hemorrhoids.  Duodenal and gastric biopsies at that time negative EGD 2011 with duodenal biopsies no changes of celiac  disease.  Diagnosis of celiac disease was made very remotely and she is generally good about following a gluten-free diet.  Review of Systems Pertinent positive and negative review of systems were noted in the above HPI section.  All other review of systems was otherwise negative.   Outpatient Encounter Medications as of 08/11/2022  Medication Sig   acetaminophen (TYLENOL) 500 MG tablet Take 1 tablet (500 mg total) by mouth every 6 (six) hours as needed for pain. (Patient taking differently: Take 1,000 mg by mouth as needed for pain or moderate pain.)   amiodarone (PACERONE) 200 MG tablet Take 1 tablet (200 mg total) by mouth daily. (Patient taking differently: Take 200 mg by mouth 2 (two) times daily.)   apixaban (ELIQUIS) 5 MG TABS tablet Take 1 tablet (5 mg total) by mouth 2 (two) times daily.   carvedilol (COREG) 12.5 MG tablet Take 1 tablet (12.5 mg total) by mouth 2 (two) times daily. Replaces metoprolol   clidinium-chlordiazePOXIDE (LIBRAX) 5-2.5 MG capsule Take 2 capsules by mouth daily as needed (cramping).   CRANBERRY-VITAMIN C PO Take 1 capsule by mouth in the morning.   diltiazem (CARDIZEM CD) 360 MG 24 hr capsule Take 1 capsule (360 mg total) by mouth daily.   esomeprazole (NEXIUM) 40 MG capsule Take 40 mg by mouth daily before breakfast.   fenofibrate 54 MG tablet Take 54 mg by mouth daily.   Glycerin, Laxative, (FLEET LIQUID GLYCERIN SUPP RE) Place 1 suppository rectally as needed (constipation).   hydrALAZINE (APRESOLINE) 50 MG tablet Take 2 tablets (100 mg total) by mouth 3 (three) times daily.   Hyoscyamine Sulfate SL (OSCIMIN) 0.125 MG SUBL Take 0.125 mg by mouth as needed (cramping).   isosorbide dinitrate (ISORDIL) 10 MG tablet Take 10 mg by mouth 2 (two) times daily.   levothyroxine (SYNTHROID) 112 MCG tablet Take 112 mcg by mouth daily before breakfast.   magnesium hydroxide (DULCOLAX) 400 MG/5ML suspension Take by mouth daily as needed for mild constipation.    Netarsudil-Latanoprost (ROCKLATAN) 0.02-0.005 % SOLN Place 1 drop into both eyes at bedtime.   ondansetron (ZOFRAN) 4 MG tablet Take 1 tablet (4 mg total) by mouth every 8 (eight) hours as needed for nausea or vomiting.   polyethylene glycol (MIRALAX / GLYCOLAX) 17 g packet Take 17 g by mouth 2 (two) times daily. (Patient taking differently: Take 68 g by mouth 2 (two) times daily.)   polyethylene glycol-electrolytes (NULYTELY) 420 g solution Take 4,000 mLs by mouth once for 1 dose.   polyvinyl alcohol (ARTIFICIAL TEARS) 1.4 % ophthalmic solution Place 1 drop into both eyes as needed for dry eyes.   senna-docusate (SENOKOT-S) 8.6-50 MG tablet Take 2 tablets by mouth 2 (two) times daily. (Patient taking differently: Take 1 tablet by mouth daily.)   Vitamin D, Ergocalciferol, (DRISDOL) 1.25 MG (50000 UNIT) CAPS capsule Take 50,000 Units by mouth every 7 (seven) days.   No facility-administered encounter medications on file as of 08/11/2022.   Allergies  Allergen Reactions   Cephalexin Other (See  Comments)    Abdominal pain , Hickman distress, Abdominal pain , Hickman distress   Hydrocodone-Acetaminophen Other (See Comments) and Rash    Palpitations, flushing, hyperactivity, Chest pain, Chest pain   Ativan [Lorazepam] Other (See Comments)    "Knocked her out, woke up 3 days later"   Augmentin [Amoxicillin-Pot Clavulanate] Nausea And Vomiting   Avelox [Moxifloxacin] Itching and Nausea Only   Brimonidine Itching and Swelling    redness   Chlorzoxazone Other (See Comments)    Went crazy/loopy   Ciprofloxacin Hcl Itching and Nausea Only   Clavulanic Acid Other (See Comments)     Unknown   Crestor [Rosuvastatin] Other (See Comments)    myalgia   Ezetimibe Diarrhea    Stomach cramps   Gluten Meal Other (See Comments)    Diarrhea, hot/cold sweat, will sleep for a couple of hours - Celiac disease   Lansoprazole Other (See Comments)    Hickman intolerance   Nitrofuran Derivatives Itching and Nausea Only    Phenergan [Promethazine Hcl] Other (See Comments)    hallucinations   Wheat Other (See Comments)    Hickman distress- celiac disease   Codeine Palpitations   Darvon [Propoxyphene Hcl] Palpitations   Doxycycline Itching and Nausea Only   Patient Active Problem List   Diagnosis Date Noted   Abnormal CT scan, colon 07/18/2022   AKI (acute kidney injury) 07/17/2022   Hyperkalemia 07/17/2022   COPD (chronic obstructive pulmonary disease) 07/13/2022   Constipation 07/12/2022   Atrial fibrillation 03/20/2022   Fracture of right ulna 03/18/2022   Atrial fibrillation with rapid ventricular response 03/17/2022   Elevated LFTs 02/05/2022   Malnutrition of moderate degree 12/24/2021   Chest pain 12/23/2021   Atrial fibrillation, chronic 12/23/2021   CKD (chronic kidney disease) stage 4, GFR 15-29 ml/min 12/23/2021   DNR (do not resuscitate) 12/23/2021   Celiac disease 12/23/2021   Interstitial cystitis 12/23/2021   Secondary hypercoagulable state 08/11/2021   Atrial flutter    NICM (nonischemic cardiomyopathy) 05/30/2021   Venous insufficiency 05/30/2021   Persistent atrial fibrillation 05/09/2021   Chronic systolic CHF (congestive heart failure) 03/17/2021   Biatrial enlargement 03/17/2021   Sepsis 03/16/2021   GERD (gastroesophageal reflux disease)    History of DVT (deep vein thrombosis)    Influenza A 03/15/2021   Anemia 03/15/2021   Atrial fibrillation with RVR 03/15/2021   COPD with acute exacerbation 03/15/2021   Dark stools 03/15/2021   Hyponatremia 03/15/2021   Hypothyroidism 03/15/2021   Coronary artery disease involving native coronary artery of native heart without angina pectoris 03/15/2021   Essential hypertension 03/15/2021   Hyperlipidemia 03/15/2021   Hyperparathyroidism 12/12/2020   Depression 01/03/2019   History of pericarditis 08/06/2016   Generalized osteoarthritis of multiple sites 08/19/2015   Age-related osteoporosis without current pathological fracture  08/14/2015   DVT (deep venous thrombosis) 08/14/2015   History of Hickman bleed 06/15/2014   Social History   Socioeconomic History   Marital status: Widowed    Spouse name: Not on file   Number of children: 5   Years of education: Not on file   Highest education level: Not on file  Occupational History   Occupation: retired  Tobacco Use   Smoking status: Former    Packs/day: 2.00    Years: 33.00    Additional pack years: 0.00    Total pack years: 66.00    Types: Cigarettes    Quit date: 1981    Years since quitting: 43.3   Smokeless tobacco: Never  Substance and Sexual Activity   Alcohol use: Not Currently    Comment: rare   Drug use: No   Sexual activity: Not on file  Other Topics Concern   Not on file  Social History Narrative   Not on file   Social Determinants of Health   Financial Resource Strain: Not on file  Food Insecurity: No Food Insecurity (07/17/2022)   Hunger Vital Sign    Worried About Running Out of Food in the Last Year: Never true    Ran Out of Food in the Last Year: Never true  Transportation Needs: No Transportation Needs (07/17/2022)   PRAPARE - Administrator, Civil Service (Medical): No    Lack of Transportation (Non-Medical): No  Physical Activity: Not on file  Stress: Not on file  Social Connections: Not on file  Intimate Partner Violence: Not At Risk (07/17/2022)   Humiliation, Afraid, Rape, and Kick questionnaire    Fear of Current or Ex-Partner: No    Emotionally Abused: No    Physically Abused: No    Sexually Abused: No    Ms. Deguzman's family history includes Bladder Cancer in her sister; Bone cancer in her sister; Brain cancer in her sister; Heart disease in her sister; Hypertension in her sister and another family member; Liver cancer in her brother and sister; Lung cancer in her brother and sister; Pancreatic cancer in her brother.      Objective:    Vitals:   08/11/22 1330 08/11/22 1421  BP: (!) 166/70 (!) 148/70   Pulse: 64     Physical Exam Well-developed well-nourished elderly WF in no acute distress.  Height, [id number]  BMI 27.9 accompanied by daughter  HEENT; nontraumatic normocephalic, EOMI, PE R LA, sclera anicteric. Oropharynx; not examined today Neck; supple, no JVD Cardiovascular; irregular rate and rhythm with S1-S2, soft systolic murmur Pulmonary; Clear bilaterally Abdomen; soft, there is mild distention, and tympany, no focal tenderness no guarding or rebound no palpable mass or hepatosplenomegaly, bowel sounds are active, multiple incisional scars Rectal; scant brown stool heme-negative, no impaction Skin; benign exam, no jaundice rash or appreciable lesions Extremities; no clubbing cyanosis or edema skin warm and dry Neuro/Psych; alert and oriented x4, grossly nonfocal mood and affect appropriate        Assessment & Plan:   #30   87 year old white female who comes in for post hospital follow-up after recent hospitalization 07/17/2022 with severe obstipation, and probable colonic ileus. She has longstanding diagnosis of celiac disease, and has had numerous prior abdominal surgeries as outlined above, no evidence for obstruction on plain films or CT imaging but had a dilated somewhat ectatic colon .  She did well with a GoLytely purge, and had no evidence of fecal impaction, and was discharged with twice daily MiraLAX, and 1-2 Senokot daily.  She has not had longstanding problems with constipation actually had previously generally had daily bowel movements or loose stools and the only change she has had in the past few months was an increase in dose of diltiazem/Cardizem from twice daily to 3 times daily. She also was hypothyroid and had an increased dose in Synthroid.  The trigger for the obstipation may be medication related i.e. diltiazem, she is also on several other cardiac meds which may be contributing.  Now with recurrent symptoms over the past 2 weeks, not as severe as  during hospitalization but has not had a bowel movement at all over the past 4 days and is mildly distended and  tympanitic today, not complaining of any pain  #2 nonischemic cardiomyopathy #3.  Congestive heart failure #4.  Chronic kidney disease stage IV #5.  Interstitial cystitis #6.  Atrial fibrillation-on Eliquis #7.  Coronary artery disease #8.  Hypertension #9.  Hypothyroidism   #10 Status post appendectomy, hysterectomy, repair of intussusception as a child, prior bowel obstructions requiring laparotomy and resections including 1 remotely that required an open wound closure.  Plan; BMET and magnesium today Patient will do a GoLytely bowel prep 1/2 gallon tomorrow over 3 to 4 hours and repeat with second half gallon the following day. After that restart MiraLAX 17 g in 8 ounces of water twice daily Have given her samples of Linzess 290 mcg, she is to start that today.  If she has good results with Linzess then we may be able to simplify her regimen and/or continue MiraLAX at a lower dose but hopefully will not require Dulcolax and Senokot in addition. Have asked him to call back in 5 to 6 days with an update and speak to my nurse and we can adjust dose of Linzess and/or offer samples of alternative promotility drug i.e. Trulance or Motegrity Patient will be established with Dr. Emelia Loron PA-C 08/11/2022   Cc: [facility]

## 2022-08-11 NOTE — Patient Instructions (Addendum)
We have sent the following medications to your pharmacy for you to pick up at your convenience: Golytely (drink 1 half gallon over 4-5 hours then repeat dose the following day)  Your provider has requested that you go to the basement level for lab work before leaving today. Press "B" on the elevator. The lab is located at the first door on the left as you exit the elevator.  Call back and speak with Amy's nurse, Beth with an update in 5-6 days.  Start Linzess 290 mcg 1 by mouth daily.  Due to recent changes in healthcare laws, you may see the results of your imaging and laboratory studies on MyChart before your provider has had a chance to review them.  We understand that in some cases there may be results that are confusing or concerning to you. Not all laboratory results come back in the same time frame and the provider may be waiting for multiple results in order to interpret others.  Please give [place] 48 hours in order for your provider to thoroughly review all the results before contacting the office for clarification of your results.

## 2022-08-12 NOTE — Progress Notes (Signed)
Addendum: Reviewed and agree with assessment and management plan. Zackrey Dyar M, MD  

## 2022-08-20 ENCOUNTER — Ambulatory Visit (INDEPENDENT_AMBULATORY_CARE_PROVIDER_SITE_OTHER): Payer: Medicare Other | Admitting: Cardiology

## 2022-08-20 ENCOUNTER — Encounter (HOSPITAL_BASED_OUTPATIENT_CLINIC_OR_DEPARTMENT_OTHER): Payer: Self-pay | Admitting: Cardiology

## 2022-08-20 VITALS — BP 98/66 | HR 66 | Ht 60.0 in | Wt 143.2 lb

## 2022-08-20 DIAGNOSIS — I428 Other cardiomyopathies: Secondary | ICD-10-CM

## 2022-08-20 DIAGNOSIS — Z7901 Long term (current) use of anticoagulants: Secondary | ICD-10-CM | POA: Diagnosis not present

## 2022-08-20 DIAGNOSIS — N1832 Chronic kidney disease, stage 3b: Secondary | ICD-10-CM

## 2022-08-20 DIAGNOSIS — D6859 Other primary thrombophilia: Secondary | ICD-10-CM | POA: Diagnosis not present

## 2022-08-20 DIAGNOSIS — I10 Essential (primary) hypertension: Secondary | ICD-10-CM

## 2022-08-20 DIAGNOSIS — I480 Paroxysmal atrial fibrillation: Secondary | ICD-10-CM

## 2022-08-20 DIAGNOSIS — I5042 Chronic combined systolic (congestive) and diastolic (congestive) heart failure: Secondary | ICD-10-CM

## 2022-08-20 NOTE — Progress Notes (Signed)
Cardiology Office Note:    Date:  08/20/2022   ID:  Amy Hickman, DOB 10/21/1935, MRN [id number]  PCP:  [facility]  Cardiologist:  Jodelle Red, MD  Referring MD: [facility]   CC: follow up  History of Present Illness:    Amy Hickman is a 87 y.o. female with a hx of persistent atrial fibrillation, hypertension, OSA on CPAP, CAD, CKD, GERD, COPD, and celiac disease who is seen for follow up today. I met her during her admission 03/17/21 for the evaluation and management of atrial fibrillation.  History: Admission 03/15/21 for sepsis in the setting of influenza A, found to have afib RVR. EF slightly reduced. Difficult to rate control, required both metoprolol and diltiazem. Cardioversion 04/24/2021 got her into sinus rhythm, but she then returned to atrial fibrillation. Started on amiodarone, repeat DCCV 06/12/21 with restoration of sinus rhythm.   She had been admitted to the hospital 06/2022 with constipation and AKI. She reports that after 3 days of treatment her bowel movements improved. Her regimen included Miralax, Dulcolax, and Senokot. Her daughter noted that she was not given her antihypertensives in the hospital and her blood pressure elevated significantly.   At her last visit, blood pressure had been well controlled at home. In clinic her blood pressure was 124/60. Heart rates were stable. Her thyroid medication had been increased.  Today, she is accompanied by her daughter. She has been struggling with a severe bladder infection due to pseudomonas. She was started on ciprofloxacin, but they note that she may have to be admitted to the hospital for treatment. Lately she has been feeling better in the mornings, but by noon she begins feeling more fatigued with less energy.  Periodically she experiences some chest pain that is similar to what she experienced before. The episodes are brief, and she states that they haven't  caused her to be concerned enough to call the office or go to the ER.  She has been working with GI regarding constipation. Recently she completed a GoLytely bowel prep and was started on Linzess once daily.  In clinic, her blood pressure is 98/66.  She denies any palpitations, shortness of breath, or peripheral edema. No lightheadedness, headaches, syncope, orthopnea, or PND.   Past Medical History:  Diagnosis Date   Atrial fibrillation, chronic (HCC)    s/p multiple cardioversions with sustained NSR, on Eliquis   Celiac disease    Chronic systolic CHF (congestive heart failure) (HCC)    Coronary artery disease involving native coronary artery of native heart without angina pectoris 03/15/2021   GERD (gastroesophageal reflux disease)    HOH (hard of hearing)    Hyperparathyroidism (HCC) 12/12/2020   Hypertension    Interstitial cystitis     Past Surgical History:  Procedure Laterality Date   ABDOMINAL HYSTERECTOMY     APPENDECTOMY     CARDIOVERSION N/A 04/24/2021   Procedure: CARDIOVERSION;  Surgeon: Jake Bathe, MD;  Location: [facility] ENDOSCOPY;  Service: Cardiovascular;  Laterality: N/A;   CARDIOVERSION N/A 06/12/2021   Procedure: CARDIOVERSION;  Surgeon: Jodelle Red, MD;  Location: [facility] ENDOSCOPY;  Service: Cardiovascular;  Laterality: N/A;   CARDIOVERSION N/A 08/11/2021   Procedure: CARDIOVERSION;  Surgeon: Meriam Sprague, MD;  Location: [facility] ENDOSCOPY;  Service: Cardiovascular;  Laterality: N/A;   CHOLECYSTECTOMY     ear drum surgery     REPLACEMENT TOTAL KNEE     ROTATOR CUFF REPAIR Right    TONSILLECTOMY  Current Medications: Current Outpatient Medications on File Prior to Visit  Medication Sig   acetaminophen (TYLENOL) 500 MG tablet Take 1 tablet (500 mg total) by mouth every 6 (six) hours as needed for pain. (Patient taking differently: Take 1,000 mg by mouth as needed for pain or moderate pain.)   amiodarone (PACERONE) 200 MG tablet Take 1 tablet (200  mg total) by mouth daily. (Patient taking differently: Take 200 mg by mouth 2 (two) times daily.)   apixaban (ELIQUIS) 5 MG TABS tablet Take 1 tablet (5 mg total) by mouth 2 (two) times daily.   ciprofloxacin (CIPRO) 500 MG tablet 500 mg 2 (two) times daily.   clidinium-chlordiazePOXIDE (LIBRAX) 5-2.5 MG capsule Take 2 capsules by mouth daily as needed (cramping).   CRANBERRY-VITAMIN C PO Take 1 capsule by mouth in the morning.   diltiazem (CARDIZEM CD) 360 MG 24 hr capsule Take 1 capsule (360 mg total) by mouth daily.   esomeprazole (NEXIUM) 40 MG capsule Take 40 mg by mouth daily before breakfast.   fenofibrate 54 MG tablet Take 54 mg by mouth daily.   hydrALAZINE (APRESOLINE) 50 MG tablet Take 2 tablets (100 mg total) by mouth 3 (three) times daily.   Hyoscyamine Sulfate SL (OSCIMIN) 0.125 MG SUBL Take 0.125 mg by mouth as needed (cramping).   isosorbide dinitrate (ISORDIL) 10 MG tablet Take 10 mg by mouth 2 (two) times daily.   levothyroxine (SYNTHROID) 112 MCG tablet Take 112 mcg by mouth daily before breakfast.   Netarsudil-Latanoprost (ROCKLATAN) 0.02-0.005 % SOLN Place 1 drop into both eyes at bedtime.   ondansetron (ZOFRAN) 4 MG tablet Take 1 tablet (4 mg total) by mouth every 8 (eight) hours as needed for nausea or vomiting.   polyethylene glycol (MIRALAX / GLYCOLAX) 17 g packet Take 17 g by mouth 2 (two) times daily. (Patient taking differently: Take 68 g by mouth 2 (two) times daily.)   polyvinyl alcohol (ARTIFICIAL TEARS) 1.4 % ophthalmic solution Place 1 drop into both eyes as needed for dry eyes.   Vitamin D, Ergocalciferol, (DRISDOL) 1.25 MG (50000 UNIT) CAPS capsule Take 50,000 Units by mouth every 7 (seven) days.   carvedilol (COREG) 12.5 MG tablet Take 1 tablet (12.5 mg total) by mouth 2 (two) times daily. Replaces metoprolol   No current facility-administered medications on file prior to visit.     Allergies:   Cephalexin, Hydrocodone-acetaminophen, Ativan [lorazepam],  Augmentin [amoxicillin-pot clavulanate], Avelox [moxifloxacin], Brimonidine, Chlorzoxazone, Ciprofloxacin hcl, Clavulanic acid, Crestor [rosuvastatin], Ezetimibe, Gluten meal, Lansoprazole, Nitrofuran derivatives, Phenergan [promethazine hcl], Wheat, Codeine, Darvon [propoxyphene hcl], and Doxycycline   Social History   Tobacco Use   Smoking status: Former    Packs/day: 2.00    Years: 33.00    Additional pack years: 0.00    Total pack years: 66.00    Types: Cigarettes    Quit date: 65    Years since quitting: 43.3   Smokeless tobacco: Never  Substance Use Topics   Alcohol use: Not Currently    Comment: rare   Drug use: No    Family History: family history includes Bladder Cancer in her sister; Bone cancer in her sister; Brain cancer in her sister; Heart disease in her sister; Hypertension in her sister and another family member; Liver cancer in her brother and sister; Lung cancer in her brother and sister; Pancreatic cancer in her brother. There is no history of Colon cancer, Colon polyps, Esophageal cancer, Rectal cancer, or Stomach cancer.  ROS:   Please see the  history of present illness. (+) Fatigue (+) Constipation (+) Periodic chest pain All other systems are reviewed and negative.    EKGs/Labs/Other Studies Reviewed:    The following studies were reviewed today:  Monitor 05/2022 Patch Wear Time:  7 days and 3 hours (2024-01-03T16:15:35-0500 to 2024-01-10T19:36:55-0500)  Patient had a min HR of 49 bpm, max HR of 95 bpm, and avg HR of 60 bpm. Predominant underlying rhythm was Sinus Rhythm. 1 run of Supraventricular Tachycardia occurred lasting 6 beats with a max rate of 95 bpm (avg 88 bpm). No VT, atrial fibrillation, high degree block, or pauses noted. Isolated atrial and ventricular ectopy was rare (<1%). There were 5 triggered events, which were largely sinus (a few with PVC). No significant arrhythmias detected.   Abdominal US 02/17/22: Liver:  No focal lesion  identified. Within normal limits in parenchymal  echogenicity. Portal vein is patent on color Doppler imaging with  normal direction of blood flow towards the liver.  IMPRESSION:  1. The common bile duct measures 9 mm which is normal for the  patient's age and status post cholecystectomy.  2. No other abnormalities.   Echo 12/24/2021: 1. Left ventricular ejection fraction, by estimation, is 40 to 45%. The  left ventricle has mildly decreased function. The left ventricle  demonstrates global hypokinesis. There is mild concentric left ventricular  hypertrophy. Left ventricular diastolic  parameters are indeterminate.   2. Right ventricular systolic function is normal. The right ventricular  size is normal.   3. Left atrial size was moderately dilated.   4. The mitral valve is normal in structure. No evidence of mitral valve  regurgitation. No evidence of mitral stenosis.   5. The Aortic valve is thickened and moderately calcified. . There is  mild calcification of the aortic valve. There is mild thickening of the  aortic valve. Aortic valve regurgitation is moderate. Suspect low flow low  gradient moderate aortic stenosis.  Aortic valve area, by VTI measures 1.09 cm. Aortic valve mean gradient  measures 11.0 mmHg. Aortic valve Vmax measures 2.02 m/s.   6. The inferior vena cava is normal in size with greater than 50%  respiratory variability, suggesting right atrial pressure of 3 mmHg.   Comparison(s): No significant change from prior study.    CT Chest 05/18/2021 [facility]): COMPARISON:  04/02/2021   FINDINGS:  Cardiovascular: Heart top-normal in size. Three-vessel coronary  artery calcifications. No pericardial effusion. Aortic  atherosclerosis.   IMPRESSION:  1. No acute findings or evidence of injury to the chest. No  fractures.  2. Aortic atherosclerosis and coronary artery calcifications.   Aortic Atherosclerosis (ICD10-I70.0) and Emphysema (ICD10-J43.9).    CT Chest 04/02/2021 [facility]): COMPARISON:  Chest x-rays from November of 2022.   FINDINGS:  Cardiovascular: Calcified atherosclerosis of the thoracic aorta.  Calcified coronary artery disease, three-vessel coronary artery  disease. No aortic dilation. Heart size moderately enlarged, no  substantial pericardial effusion. Central pulmonary vasculature is  normal caliber. Limited assessment of cardiovascular structures  given lack of intravenous contrast.   IMPRESSION:  No acute findings in the chest.   3 mm RIGHT middle lobe pulmonary nodule. No follow-up needed if  patient is low-risk. Non-contrast chest CT can be considered in 12  months if patient is high-risk. This recommendation follows the  consensus statement: Guidelines for Management of Incidental  Pulmonary Nodules Detected on CT Images: From the Fleischner Society  2017; Radiology 2017; 284:228-243.   Three-vessel coronary artery disease.  Postoperative changes about the GE junction.   Aortic atherosclerosis.   Echo 03/16/21 1. Left ventricular ejection fraction, by estimation, is 40 to 45%. The  left ventricle has mildly decreased function. The left ventricle  demonstrates global hypokinesis. Left ventricular diastolic parameters are  indeterminate.   2. Right ventricular systolic function is mildly reduced. The right  ventricular size is normal. There is moderately elevated pulmonary artery  systolic pressure. The estimated right ventricular systolic pressure is  46.8 mmHg.   3. Left atrial size was moderately dilated.   4. Right atrial size was severely dilated.   5. The mitral valve is normal in structure. Mild to moderate mitral valve  regurgitation.   6. Tricuspid valve regurgitation is moderate.   7. The aortic valve is tricuspid. There is severe calcifcation of the  aortic valve. Aortic valve regurgitation is mild. Moderate aortic valve  stenosis. Gradients consistent with mild AS (MG  , Vmax 2.2 m/s), but moderate by AVA (1.0cm^2) and DI  (0.32). Low SV index, suspect low flow low gradient moderate AS   8. The inferior vena cava is dilated in size with <50% respiratory  variability, suggesting right atrial pressure of 15 mmHg.   EKG:  EKG is personally reviewed.   08/20/2022:  EKG was not ordered. 07/27/2022:  EKG was not ordered. 06/23/22: SR at 69 bpm with single PVC 02/20/22: sinus bradycardia at 56 bpm 01/05/22: Sinus bradycardia. Rate 56 bpm. 05/30/2021: atrial flutter at 77 bpm 05/09/2021: atrial fibrillation at 83 bpm 04/08/21: atrial fibrillation at 86 bpm  Recent Labs: 03/18/2022: B Natriuretic Peptide 187.8 07/17/2022: ALT 33 07/18/2022: Hemoglobin 11.9; Platelets 188; TSH 7.679 08/11/2022: BUN 19; Creatinine, Ser 1.49; Magnesium 2.2; Potassium 4.7; Sodium 134   Recent Lipid Panel No results found for: "CHOL", "TRIG", "HDL", "CHOLHDL", "VLDL", "LDLCALC", "LDLDIRECT"  Physical Exam:    VS:  BP 98/66   Pulse 66   Ht 5' (1.524 m)   Wt 143 lb 3.2 oz (65 kg)   SpO2 98%   BMI 27.97 kg/m     Wt Readings from Last 3 Encounters:  08/20/22 143 lb 3.2 oz (65 kg)  08/11/22 143 lb 2 oz (64.9 kg)  07/27/22 146 lb 1.6 oz (66.3 kg)    GEN: Well nourished, well developed in no acute distress HEENT: Normal, moist mucous membranes NECK: No JVD CARDIAC: regular rhythm, normal S1 and S2, no rubs or gallops. 2/6 systolic murmur. VASCULAR: Radial and DP pulses 2+ bilaterally. No carotid bruits RESPIRATORY:  Clear to auscultation without rales, wheezing or rhonchi  ABDOMEN: Soft, non-tender, non-distended MUSCULOSKELETAL:  Ambulates independently SKIN: Warm and dry, no significant edema NEUROLOGIC:  Alert and oriented x 3. No focal neuro deficits noted. PSYCHIATRIC:  Normal affect     ASSESSMENT:    1. Paroxysmal atrial fibrillation (HCC)   2. Hypercoagulable state (HCC)   3. Long term current use of anticoagulant   4. NICM (nonischemic cardiomyopathy) (HCC)    5. Primary hypertension   6. Stage 3b chronic kidney disease (HCC)   7. Chronic combined systolic and diastolic heart failure (HCC)     PLAN:    Chronic kidney disease, stage 3b vs stage 4 Hypertension -blood pressure is low for her today, possibly due to her current UTI. She is asymptomatic from her blood pressure. Has typically been elevated at visits in the past -ARB held during prior hospitalization, has not been restarted. Per nephrology. -continue hydralazine 100 mg TID -continue carvedilol, imdur -Once she is feeling  better, if BP elevated, next steps would be to change diltiazem to amlodipine, follow HR. Diltiazem can also cause constipation <2% of the time, so if this is not improved on close follow up, would be another reason to try to come off diltiazem -clonidine would be last step  Paroxysmal atrial fibrillation/atrial flutter Chronic systolic and diastolic heart failure Nonischemic cardiomyopathy -EF 40-45%. Not significantly changed with holding sinus rhythm -Cath with nonobstructive CAD -tolerating carvedilol -would prefer not to use diltiazem given low EF, but was not rate controlled on beta blocker alone. Will continue diltiazem for now, consider changing to amlodipine and increasing carvedilol dose in the future if needed -CHA2DS2/VAS Stroke Risk Points=8  -continue apixaban 5 mg BID -tolerating amiodarone  OSA: continue CPAP  Moderate aortic stenosis Mild-moderate MR -last echo 12/2021, follow annually or for change in symptoms  CAD, nonobstructive, without angina -given age, continue apixaban and no aspirin -continue atorvastatin  Plan for follow up: 2 months, or sooner as needed.   Jodelle Red, MD, PhD, [facility]  [facility]  [place]  [facility] at [facility] at [facility] [address], [address] [place], [place] [address] [phone]   Medication Adjustments/Labs and Tests  Ordered: Current medicines are reviewed at length with the patient today.  Concerns regarding medicines are outlined above.   No orders of the defined types were placed in this encounter.  No orders of the defined types were placed in this encounter.  Patient Instructions  Medication Instructions:  The current medical regimen is effective;  continue present plan and medications.  *If you need a refill on your cardiac medications before your next appointment, please call your pharmacy*   Lab Work: None  Testing/Procedures: None   Follow-Up: At [facility], you and your health needs are our priority.  As part of our continuing mission to provide you with exceptional heart care, we have created designated Provider Care Teams.  These Care Teams include your primary Cardiologist (physician) and Advanced Practice Providers (APPs -  Physician Assistants and Nurse Practitioners) who all work together to provide you with the care you need, when you need it.  We recommend signing up for the patient portal called "MyChart".  Sign up information is provided on this After Visit Summary.  MyChart is used to connect with patients for Virtual Visits (Telemedicine).  Patients are able to view lab/test results, encounter notes, upcoming appointments, etc.  Non-urgent messages can be sent to your provider as well.   To learn more about what you can do with MyChart, go to [email or web address].    Your next appointment:   2 month(s)  Provider:   Jodelle Red, MD    Other Instructions None   I,Mathew Stumpf,acting as a scribe for Jodelle Red, MD.,have documented all relevant documentation on the behalf of Jodelle Red, MD,as directed by  Jodelle Red, MD while in the presence of Jodelle Red, MD.  I, Jodelle Red, MD, have reviewed all documentation for this visit. The documentation on 08/20/22 for the exam, diagnosis, procedures, and  orders are all accurate and complete.  Signed, Jodelle Red, MD PhD 08/20/2022    [facility]

## 2022-08-20 NOTE — Patient Instructions (Signed)
Medication Instructions:  The current medical regimen is effective;  continue present plan and medications.  *If you need a refill on your cardiac medications before your next appointment, please call your pharmacy*   Lab Work: None  Testing/Procedures: None   Follow-Up: At [facility], you and your health needs are our priority.  As part of our continuing mission to provide you with exceptional heart care, we have created designated Provider Care Teams.  These Care Teams include your primary Cardiologist (physician) and Advanced Practice Providers (APPs -  Physician Assistants and Nurse Practitioners) who all work together to provide you with the care you need, when you need it.  We recommend signing up for the patient portal called "MyChart".  Sign up information is provided on this After Visit Summary.  MyChart is used to connect with patients for Virtual Visits (Telemedicine).  Patients are able to view lab/test results, encounter notes, upcoming appointments, etc.  Non-urgent messages can be sent to your provider as well.   To learn more about what you can do with MyChart, go to [email or web address].    Your next appointment:   2 month(s)  Provider:   Jodelle Red, MD    Other Instructions None

## 2022-08-31 ENCOUNTER — Telehealth: Payer: Self-pay | Admitting: Cardiology

## 2022-08-31 MED ORDER — HYDRALAZINE HCL 100 MG PO TABS: 100.0000 mg | ORAL_TABLET | Freq: Three times a day (TID) | ORAL | 0 refills | Status: DC

## 2022-08-31 NOTE — Telephone Encounter (Signed)
Refilled as requested  

## 2022-08-31 NOTE — Telephone Encounter (Signed)
Pt c/o medication issue:  1. Name of Medication:   hydrALAZINE (APRESOLINE) 50 MG tablet    2. How are you currently taking this medication (dosage and times per day)?   Take 2 tablets (100 mg total) by mouth 3 (three) times daily.    3. Are you having a reaction (difficulty breathing--STAT)? No  4. What is your medication issue? Patient stated that this medication [facility] with the new dosage amount. Patient would [facility], but patient needs a small portion sent to the [facility] in [place] for an emergency pick up. Patient stated she only has 1 1/2 tablets left. Please advise.

## 2022-09-02 ENCOUNTER — Ambulatory Visit
Admission: RE | Admit: 2022-09-02 | Discharge: 2022-09-02 | Disposition: A | Discharge status: Home / Self Care | Payer: Medicare Other | Source: Ambulatory Visit | Attending: Family Medicine | Admitting: Family Medicine

## 2022-09-02 ENCOUNTER — Other Ambulatory Visit: Payer: Self-pay | Admitting: Family Medicine

## 2022-09-02 DIAGNOSIS — M25551 Pain in right hip: Secondary | ICD-10-CM

## 2022-09-03 ENCOUNTER — Telehealth: Payer: Self-pay

## 2022-09-03 NOTE — Telephone Encounter (Signed)
New patient first seen by Mike Gip, PA to be established with you.  She was seen for hospital follow up. Started on Miralax and Linzess. Reports this was working at first but seems to have become less effective as she continues to use it. Amy's plan indicates she would try another medication.  " If she has good results with Linzess then we may be able to simplify her regimen and/or continue MiraLAX at a lower dose but hopefully will not require Dulcolax and Senokot in addition. Have asked him to call back in 5 to 6 days with an update and speak to my nurse and we can adjust dose of Linzess and/or offer samples of alternative promotility drug i.e. Trulance or Motegrity."  Please advise in Amy's absence. Thank you

## 2022-09-03 NOTE — Telephone Encounter (Signed)
Phone call to the patient to advise. Explained she is to take Trulance once daily and to continue the Miralax. Follow up with her progress in 5 to 7 days.

## 2022-09-03 NOTE — Telephone Encounter (Signed)
If Linzess is lost efficacy I will try her on Trulance 3 mg daily with MiraLAX

## 2022-09-10 NOTE — Telephone Encounter (Signed)
Inbound call from patient's daughter, States Elon Jester is no longer working for the patient, would like to discuss alternative.

## 2022-09-11 ENCOUNTER — Other Ambulatory Visit: Payer: Self-pay | Admitting: Cardiology

## 2022-09-11 MED ORDER — TRULANCE 3 MG PO TABS: 3.0000 mg | ORAL_TABLET | Freq: Every day | ORAL | 3 refills | Status: DC

## 2022-09-11 NOTE — Telephone Encounter (Signed)
See My Chart message 5/24

## 2022-09-11 NOTE — Telephone Encounter (Signed)
Amy Hickman is it ok to send prescription for Trulance?

## 2022-09-11 NOTE — Telephone Encounter (Signed)
Patient's daughter called asking if it is possible to take 2 doses of Trulance prescription a day because patient stated she has had a BM. Informed her of previous Mychart message. Requesting a call back to have an update. Please advise, thank you.

## 2022-09-11 NOTE — Telephone Encounter (Signed)
Inbound call from patient daughter f/u on previous note. Please advise.  Thank you

## 2022-09-11 NOTE — Addendum Note (Signed)
Addended by: Loretha Stapler on: 09/11/2022 04:12 PM   Modules accepted: Orders

## 2022-09-15 ENCOUNTER — Other Ambulatory Visit (HOSPITAL_COMMUNITY): Payer: Self-pay

## 2022-09-15 ENCOUNTER — Telehealth: Payer: Self-pay | Admitting: Pharmacy Technician

## 2022-09-15 MED ORDER — TRULANCE 3 MG PO TABS: 3.0000 mg | ORAL_TABLET | Freq: Every day | ORAL | 3 refills | Status: DC

## 2022-09-15 NOTE — Telephone Encounter (Signed)
See MyChart messages.

## 2022-09-15 NOTE — Telephone Encounter (Signed)
Patient requesting a phone call regarding Trulance medication. Requesting a update. Please advise, thank you.

## 2022-09-15 NOTE — Telephone Encounter (Signed)
Patient Advocate Encounter  Received notification from [employer] that prior authorization for TRULANCE 3MG  is required.   PA submitted on 5.28.24 [employer] [facility] CASE# [id number] Status is pending

## 2022-09-15 NOTE — Telephone Encounter (Addendum)
Additional information was required for this patient. Received notification via fax. Filled out form and submitted with chart notes.   FAX: [phone] Status is pending.

## 2022-09-15 NOTE — Telephone Encounter (Signed)
The pt has been advised that we are waiting for insurance to determine if Trulance will be covered. She is aware and expedited request has been sent

## 2022-09-25 ENCOUNTER — Telehealth (HOSPITAL_BASED_OUTPATIENT_CLINIC_OR_DEPARTMENT_OTHER): Payer: Self-pay | Admitting: Cardiology

## 2022-09-25 NOTE — Telephone Encounter (Signed)
Patient Advocate Encounter  Prior Authorization for Trulance 3MG  tablets has been approved through [employer].    Key: [id number]  Effective: 09-17-2022 to 09-17-2023

## 2022-09-25 NOTE — Telephone Encounter (Signed)
Pt c/o BP issue: STAT if pt c/o blurred vision, one-sided weakness or slurred speech  1. What are your last 5 BP readings?  6/7 130/80 (sitting)  102/60 (when standing) 6/3 - 150/80  2. Are you having any other symptoms (ex. Dizziness, headache, blurred vision, passed out)?   Feeling faint, nauseous, very tired  3. What is your BP issue?   Caller stated patient has been having high BP reading this week.  Caller stated patient was also recently started on Plecanatide (TRULANCE) 3 MG TABS.    Can also return call to daughter-Tamara at [phone].

## 2022-09-25 NOTE — Telephone Encounter (Signed)
Returned call to [facility] of [facility],   She states that she recently tore a muscle in her hip, has been not weight bearing for 3-4 weeks. They just started moving her for the first time and she is feeling nauseated and faint with sitting to standing. Orthos- 130/80 102/62. She does also have some GI stuff going on, has been off and on a new medication Called Trulance. They are going to continue to monitor progress.  Advised Beth to continue getting her moving as she is likely orthostatic due to inactivity. Advised to keep an eye on BP and if unchanged to let us know. Also advised that she call GI doctor and let them know is GI symptoms do not resolve with medication.

## 2022-10-19 ENCOUNTER — Other Ambulatory Visit: Payer: Self-pay

## 2022-10-19 ENCOUNTER — Encounter (HOSPITAL_BASED_OUTPATIENT_CLINIC_OR_DEPARTMENT_OTHER): Payer: Self-pay | Admitting: Urology

## 2022-10-19 ENCOUNTER — Emergency Department (HOSPITAL_BASED_OUTPATIENT_CLINIC_OR_DEPARTMENT_OTHER)
Admission: EM | Admit: 2022-10-19 | Discharge: 2022-10-19 | Disposition: A | Discharge status: Home / Self Care | Payer: Medicare Other | Attending: Emergency Medicine | Admitting: Emergency Medicine

## 2022-10-19 DIAGNOSIS — N390 Urinary tract infection, site not specified: Secondary | ICD-10-CM | POA: Diagnosis not present

## 2022-10-19 DIAGNOSIS — N184 Chronic kidney disease, stage 4 (severe): Secondary | ICD-10-CM | POA: Insufficient documentation

## 2022-10-19 DIAGNOSIS — Z79899 Other long term (current) drug therapy: Secondary | ICD-10-CM | POA: Insufficient documentation

## 2022-10-19 DIAGNOSIS — Z7901 Long term (current) use of anticoagulants: Secondary | ICD-10-CM | POA: Insufficient documentation

## 2022-10-19 DIAGNOSIS — I130 Hypertensive heart and chronic kidney disease with heart failure and stage 1 through stage 4 chronic kidney disease, or unspecified chronic kidney disease: Secondary | ICD-10-CM | POA: Diagnosis not present

## 2022-10-19 DIAGNOSIS — I5022 Chronic systolic (congestive) heart failure: Secondary | ICD-10-CM | POA: Diagnosis not present

## 2022-10-19 DIAGNOSIS — R300 Dysuria: Secondary | ICD-10-CM | POA: Diagnosis present

## 2022-10-19 LAB — URINALYSIS, ROUTINE W REFLEX MICROSCOPIC
Bilirubin Urine: NEGATIVE
Glucose, UA: NEGATIVE mg/dL
Ketones, ur: NEGATIVE mg/dL
Nitrite: NEGATIVE
Protein, ur: 100 mg/dL — AB
Specific Gravity, Urine: 1.02 (ref 1.005–1.030)
pH: 7 (ref 5.0–8.0)

## 2022-10-19 LAB — URINALYSIS, MICROSCOPIC (REFLEX): WBC, UA: >50 WBC/hpf (ref 0–5)

## 2022-10-19 MED ORDER — FOSFOMYCIN TROMETHAMINE 3 G PO PACK
3.0000 g | PACK | Freq: Once | ORAL | Status: AC
  Administered 2022-10-19: 3 g via ORAL
  Filled 2022-10-19: qty 3

## 2022-10-19 MED ORDER — CEFDINIR 300 MG PO CAPS: 300.0000 mg | ORAL_CAPSULE | Freq: Every day | ORAL | 0 refills | Status: DC

## 2022-10-19 NOTE — ED Triage Notes (Signed)
Pt states frequency, urgency,and burning with urination that started yesterday  Reports lower back pain as well  H/o frequent UTIs and take maintenance antibiotic but has been out x 3 days

## 2022-10-19 NOTE — Discharge Instructions (Signed)
The antibiotic provided in the ER today is a single dose treatment and does not need further treatment.  I sent a refill for your Cefdinir to your [facility] pharmacy.  Please recheck with your doctor if not improving.

## 2022-10-19 NOTE — ED Provider Notes (Signed)
[facility] Provider Note   CSN: [id number] Arrival date & time: 10/19/22  1844     History  Chief Complaint  Patient presents with   Dysuria    Amy Hickman is a 87 y.o. female.  87 year old female presents with complaint of dysuria, low back pain and urinary frequency.  Symptoms started yesterday.  Patient is normally on Omnicef daily for prophylaxis of UTIs however ran out of her medication 3 days ago.  Denies fevers, chills, vomiting.  No other complaints or concerns.       Home Medications Prior to Admission medications   Medication Sig Start Date End Date Taking? Authorizing Provider  cefdinir (OMNICEF) 300 MG capsule Take 1 capsule (300 mg total) by mouth daily. 10/19/22  Yes Jeannie Fend, PA-C  acetaminophen (TYLENOL) 500 MG tablet Take 1 tablet (500 mg total) by mouth every 6 (six) hours as needed for pain. Patient taking differently: Take 1,000 mg by mouth as needed for pain or moderate pain. 06/24/12   Gerhard Munch, MD  amiodarone (PACERONE) 200 MG tablet Take 1 tablet (200 mg total) by mouth daily. Patient taking differently: Take 200 mg by mouth 2 (two) times daily. 03/05/22   Newman Nip, NP  apixaban (ELIQUIS) 5 MG TABS tablet Take 1 tablet (5 mg total) by mouth 2 (two) times daily. 06/23/22   Jodelle Red, MD  carvedilol (COREG) 12.5 MG tablet Take 1 tablet (12.5 mg total) by mouth 2 (two) times daily. Replaces metoprolol 05/29/22 08/11/22  Jodelle Red, MD  clidinium-chlordiazePOXIDE (LIBRAX) 5-2.5 MG capsule Take 2 capsules by mouth daily as needed (cramping). 02/28/21   [provider]  CRANBERRY-VITAMIN C PO Take 1 capsule by mouth in the morning.    [provider]  diltiazem (CARDIZEM CD) 360 MG 24 hr capsule Take 1 capsule (360 mg total) by mouth daily. 06/12/21   Jodelle Red, MD  esomeprazole (NEXIUM) 40 MG capsule Take 40 mg by mouth daily before breakfast.     [provider]  fenofibrate 54 MG tablet Take 54 mg by mouth daily. 07/15/22   [provider]  hydrALAZINE (APRESOLINE) 100 MG tablet Take 1 tablet (100 mg total) by mouth 3 (three) times daily. 08/31/22   Jodelle Red, MD  Hyoscyamine Sulfate SL (OSCIMIN) 0.125 MG SUBL Take 0.125 mg by mouth as needed (cramping). 12/05/21   [provider]  isosorbide dinitrate (ISORDIL) 10 MG tablet Take 10 mg by mouth 2 (two) times daily. 12/31/21   [provider]  levothyroxine (SYNTHROID) 112 MCG tablet Take 112 mcg by mouth daily before breakfast. 07/24/22   [provider]  Netarsudil-Latanoprost (ROCKLATAN) 0.02-0.005 % SOLN Place 1 drop into both eyes at bedtime. 12/30/21   [provider]  ondansetron (ZOFRAN) 4 MG tablet Take 1 tablet (4 mg total) by mouth every 8 (eight) hours as needed for nausea or vomiting. 07/20/22   Imogene Burn, MD  Plecanatide (TRULANCE) 3 MG TABS Take 1 tablet (3 mg total) by mouth daily. 09/15/22   Pyrtle, Carie Caddy, MD  polyethylene glycol (MIRALAX / GLYCOLAX) 17 g packet Take 17 g by mouth 2 (two) times daily. Patient taking differently: Take 68 g by mouth 2 (two) times daily. 07/13/22   Danford, Earl Lites, MD  polyvinyl alcohol (ARTIFICIAL TEARS) 1.4 % ophthalmic solution Place 1 drop into both eyes as needed for dry eyes.    [provider]  Vitamin D, Ergocalciferol, (DRISDOL) 1.25  MG (50000 UNIT) CAPS capsule Take 50,000 Units by mouth every 7 (seven) days. 05/22/21   [provider]      Allergies    Cephalexin, Hydrocodone-acetaminophen, Ativan [lorazepam], Augmentin [amoxicillin-pot clavulanate], Avelox [moxifloxacin], Brimonidine, Chlorzoxazone, Ciprofloxacin hcl, Clavulanic acid, Crestor [rosuvastatin], Ezetimibe, Gluten meal, Lansoprazole, Nitrofuran derivatives, Phenergan [promethazine hcl], Wheat, Codeine, Darvon [propoxyphene hcl], and Doxycycline    Review of Systems   Review of  Systems Negative except as per HPI Physical Exam Updated Vital Signs BP (!) 164/80   Pulse 78   Temp 98 F (36.7 C) (Oral)   Resp 19   Ht 5' (1.524 m)   Wt 65 kg   SpO2 98%   BMI 27.99 kg/m  Physical Exam Vitals and nursing note reviewed.  Constitutional:      General: She is not in acute distress.    Appearance: She is well-developed. She is not diaphoretic.  HENT:     Head: Normocephalic and atraumatic.  Cardiovascular:     Rate and Rhythm: Normal rate and regular rhythm.     Heart sounds: Normal heart sounds.  Pulmonary:     Effort: Pulmonary effort is normal.     Breath sounds: Normal breath sounds.  Abdominal:     Palpations: Abdomen is soft.     Tenderness: There is no abdominal tenderness. There is no right CVA tenderness or left CVA tenderness.  Musculoskeletal:     Right lower leg: No edema.     Left lower leg: No edema.  Skin:    General: Skin is warm and dry.     Findings: No erythema or rash.  Neurological:     Mental Status: She is alert and oriented to person, place, and time.  Psychiatric:        Behavior: Behavior normal.     ED Results / Procedures / Treatments   Labs (all labs ordered are listed, but only abnormal results are displayed) Labs Reviewed  URINALYSIS, ROUTINE W REFLEX MICROSCOPIC - Abnormal; Notable for the following components:      Result Value   APPearance CLOUDY (*)    Hgb urine dipstick TRACE (*)    Protein, ur 100 (*)    Leukocytes,Ua MODERATE (*)    All other components within normal limits  URINALYSIS, MICROSCOPIC (REFLEX) - Abnormal; Notable for the following components:   Bacteria, UA FEW (*)    All other components within normal limits    EKG None  Radiology No results found.  Procedures Procedures    Medications Ordered in ED Medications  fosfomycin (MONUROL) packet 3 g (3 g Oral Given 10/19/22 2042)    ED Course/ Medical Decision Making/ A&P                             Medical Decision  Making Amount and/or Complexity of Data Reviewed Labs: ordered.  Risk Prescription drug management.   This patient presents to the ED for concern of urinary tract infection, this involves an extensive number of treatment options, and is a complaint that carries with it a high risk of complications and morbidity.  The differential diagnosis includes UTI, kidney stone, diverticulitis   Co morbidities that complicate the patient evaluation  CKD stage IV, celiac disease, A-fib on Eliquis, hypertension, chronic systolic heart failure   Additional history obtained:  Additional history obtained from daughter at bedside who contributes to history as above External records from outside source obtained and reviewed including  prior urine culture which grew Klebsiella pneumonia with resistance to Macrobid and ampicillin   Lab Tests:  I Ordered, and personally interpreted labs.  The pertinent results include: Urinalysis with trace hemoglobin, protein, moderate leukocytes with few bacteria and greater than 50 white cells   Problem List / ED Course / Critical interventions / Medication management  87 year old female brought in by daughter for concern for UTI, ran out of her prophylaxis and new rx has not arrived from mail order rx. Abd soft and non tender, no CVA tenderness, no fevers, no vomiting. UA with moderate leukocytes, greater than 50 white cells with few bacteria, protein and hemoglobin.  Plan is to treat with fosfomycin today due to list of allergies/intolerances and resistance concerns.  Refill of patient's cefdinir was also sent to her pharmacy for prophylaxis. I ordered medication including fosfomycin  for UTI  Reevaluation of the patient after these medicines showed that the patient stayed the same I have reviewed the patients home medicines and have made adjustments as needed   Social Determinants of Health:  Lives with family   Test / Admission - Considered:  Afebrile,  well-appearing, can be discharged with antibiotics.         Final Clinical Impression(s) / ED Diagnoses Final diagnoses:  Urinary tract infection in female    Rx / DC Orders ED Discharge Orders          Ordered    cefdinir (OMNICEF) 300 MG capsule  Daily        10/19/22 2036              Jeannie Fend, PA-C 10/19/22 2052    Jacalyn Lefevre, MD 10/19/22 2359

## 2022-11-04 ENCOUNTER — Telehealth (HOSPITAL_BASED_OUTPATIENT_CLINIC_OR_DEPARTMENT_OTHER): Payer: Self-pay | Admitting: *Deleted

## 2022-11-04 DIAGNOSIS — I4819 Other persistent atrial fibrillation: Secondary | ICD-10-CM

## 2022-11-04 MED ORDER — AMIODARONE HCL 200 MG PO TABS
200.0000 mg | ORAL_TABLET | Freq: Every day | ORAL | 2 refills | Status: DC
Start: 2022-11-04 — End: 2023-08-02

## 2022-11-04 MED ORDER — HYDRALAZINE HCL 100 MG PO TABS: 100.0000 mg | ORAL_TABLET | Freq: Three times a day (TID) | ORAL | 2 refills | Status: DC

## 2022-11-04 NOTE — Telephone Encounter (Signed)
Rx(s) sent to pharmacy electronically.  

## 2022-11-09 ENCOUNTER — Ambulatory Visit (INDEPENDENT_AMBULATORY_CARE_PROVIDER_SITE_OTHER): Payer: Medicare Other | Admitting: Cardiology

## 2022-11-09 ENCOUNTER — Encounter (HOSPITAL_BASED_OUTPATIENT_CLINIC_OR_DEPARTMENT_OTHER): Payer: Self-pay | Admitting: Cardiology

## 2022-11-09 VITALS — BP 149/65 | HR 62 | Ht 60.0 in | Wt 128.1 lb

## 2022-11-09 DIAGNOSIS — I480 Paroxysmal atrial fibrillation: Secondary | ICD-10-CM | POA: Diagnosis not present

## 2022-11-09 DIAGNOSIS — I428 Other cardiomyopathies: Secondary | ICD-10-CM

## 2022-11-09 DIAGNOSIS — Z7901 Long term (current) use of anticoagulants: Secondary | ICD-10-CM

## 2022-11-09 DIAGNOSIS — I5042 Chronic combined systolic (congestive) and diastolic (congestive) heart failure: Secondary | ICD-10-CM

## 2022-11-09 DIAGNOSIS — D6859 Other primary thrombophilia: Secondary | ICD-10-CM | POA: Diagnosis not present

## 2022-11-09 NOTE — Patient Instructions (Signed)
Medication Instructions:  None  *If you need a refill on your cardiac medications before your next appointment, please call your pharmacy*   Lab Work: None   Testing/Procedures: None   Follow-Up: At [facility], you and your health needs are our priority.  As part of our continuing mission to provide you with exceptional heart care, we have created designated Provider Care Teams.  These Care Teams include your primary Cardiologist (physician) and Advanced Practice Providers (APPs -  Physician Assistants and Nurse Practitioners) who all work together to provide you with the care you need, when you need it.  We recommend signing up for the patient portal called "MyChart".  Sign up information is provided on this After Visit Summary.  MyChart is used to connect with patients for Virtual Visits (Telemedicine).  Patients are able to view lab/test results, encounter notes, upcoming appointments, etc.  Non-urgent messages can be sent to your provider as well.   To learn more about what you can do with MyChart, go to [email or web address].    Your next appointment:   3 month(s)  Provider:   Jodelle Red, MD  or Gillian Shields NP  Other Instructions None

## 2022-11-09 NOTE — Progress Notes (Signed)
Cardiology Office Note:  .    Date:  11/09/2022  ID:  Amy Hickman, DOB 14-Mar-1936, MRN [id number] PCP: [facility] Providers Cardiologist:  Jodelle Red, MD     History of Present Illness: .    Amy Hickman is a 87 y.o. female with a hx of persistent atrial fibrillation, hypertension, OSA on CPAP, CAD, CKD, GERD, COPD, and celiac disease who is seen for follow up today. I met her during her admission 03/17/21 for the evaluation and management of atrial fibrillation.   History: Admission 03/15/21 for sepsis in the setting of influenza A, found to have afib RVR. EF slightly reduced. Difficult to rate control, required both metoprolol and diltiazem. Cardioversion 04/24/2021 got her into sinus rhythm, but she then returned to atrial fibrillation. Started on amiodarone, repeat DCCV 06/12/21 with restoration of sinus rhythm.    At her visit 08/2022, she had been struggling with a severe bladder infection due to pseudomonas. She was started on ciprofloxacin, but they noted she may have to be admitted to the hospital for treatment. By noon she would feel more fatigued. Periodically complained of brief episodes of chest pain, similar to what she experienced in the past. They hadn't caused her to be concerned enough to call the office or go to the ER. She was working with GI regarding constipation. Completed a GoLytely bowel prep and was started on Linzess once daily.  Today, she is accompanied by her daughter who also assists with the history. She had a recent mechanical fall resulting in left toe fracture and a gluteus medius tendon tear of the right hip. For the past month her physical activity has been severely limited due to her recovery. Recently she started walking again with assistance of a rollator. Has PT coming to her house.  Given her lack of physical activity, she states that she is unsure if she has had arrhythmic episodes.  She endorses a weight loss of about 20 lbs (prior weight loss attributable to celiac disease), however lately her weight has been more stable around 125-128 lbs. Her daughter has noticed diminished muscle mass as well.  She complains of constant nausea which has been an issue for a couple weeks.  She denies any chest pain, shortness of breath, peripheral edema, lightheadedness, headaches, syncope, orthopnea, or PND.  They have questions regarding her amiodarone. Her thyroid function tests have been abnormal, and her primary care team is recommending she stop the amiodarone. She has an upcoming appt with endocrinology to discuss as well. We discussed options at length, see below.  ROS:  Please see the history of present illness. ROS otherwise negative except as noted.  (+) Weight loss (+) Nausea  Studies Reviewed: Marland Kitchen       No new today    Physical Exam:    VS:  BP (!) 149/65 (BP Location: Left Arm, Patient Position: Sitting, Cuff Size: Normal)   Pulse 62   Ht 5' (1.524 m)   Wt 128 lb 1.6 oz (58.1 kg)   SpO2 95%   BMI 25.02 kg/m    Wt Readings from Last 3 Encounters:  11/09/22 128 lb 1.6 oz (58.1 kg)  10/19/22 143 lb 4.8 oz (65 kg)  08/20/22 143 lb 3.2 oz (65 kg)    GEN: Well nourished, well developed in no acute distress HEENT: Normal, moist mucous membranes NECK: No JVD CARDIAC: regular rhythm, normal S1 and S2, no rubs or gallops. 2/6 systolic  murmur and 1/4 diastolic murmur VASCULAR: Radial and DP pulses 2+ bilaterally. No carotid bruits RESPIRATORY:  Clear to auscultation without rales, wheezing or rhonchi  ABDOMEN: Soft, non-tender, non-distended MUSCULOSKELETAL:  Ambulates with rollator SKIN: Warm and dry, no edema NEUROLOGIC:  Alert and oriented x 3. No focal neuro deficits noted. PSYCHIATRIC:  Normal affect   ASSESSMENT AND PLAN: .    Chronic kidney disease, stage 3b vs stage 4 Hypertension -BP previously low -ARB held during prior hospitalization, has not been  restarted. Per nephrology. -continue hydralazine 100 mg TID, carvedilol, imdur -will hold on changes for now, awaiting plan for amiodarone as below. Could consider changing diltiazem to amlodipine, but if HR control needed, will not be able to change. -clonidine would be last step   Paroxysmal atrial fibrillation/atrial flutter Chronic systolic and diastolic heart failure Nonischemic cardiomyopathy -EF 40-45%. Not significantly changed with holding sinus rhythm -Cath with nonobstructive CAD -tolerating carvedilol -would prefer not to use diltiazem given low EF, but was not rate controlled on beta blocker alone. Will continue diltiazem for now, consider changing to amlodipine and increasing carvedilol dose in the future if needed -CHA2DS2/VAS Stroke Risk Points=8  -continue apixaban 5 mg BID  We discussed amiodarone at length. Her afib was difficult to rate control on beta blocker and calcium channel blocker. Prior cardioversion did not hold. She required amiodarone to maintain SR. Given her reduced EF, the only options for antiarrhythmics for her are amiodarone and tikosyn, which would require hospitalization to start.  We discussed continuing rhythm control, either with continuing amiodarone or changing to tikosyn. We also discussed changing to rate control strategy with carvedilol and diltiazem (though she has had RVR with this in the past).  Discussed thyroid issues 2/2 amiodarone. Her LFTs are very slightly above normal based on recent scale of labs, but overall stable within her chronic range. No worsening pulmonary symptoms.  After extensive shared decision making, they would like to continue amiodarone until they speak with the endocrinologist. If able, they would like to treat the thyroid and continue amiodarone, as it has done a good job at holding her rhythm. If they are recommended to stop the amiodarone, this would need a 30 day washout before we could even consider tikosyn.   They  will contact me after meeting with the endocrinologist and we will take the next step at that time.   OSA: continue CPAP   Moderate aortic stenosis Mild-moderate MR -last echo 12/2021, follow annually or for change in symptoms   CAD, nonobstructive, without angina -given age, continue apixaban and no aspirin -continue atorvastatin  Dispo: Follow-up in 3 months, or sooner as needed.  I,Mathew Stumpf,acting as a Neurosurgeon for [employer], MD.,have documented all relevant documentation on the behalf of Jodelle Red, MD,as directed by  Jodelle Red, MD while in the presence of Jodelle Red, MD.  I, Jodelle Red, MD, have reviewed all documentation for this visit. The documentation on 11/09/22 for the exam, diagnosis, procedures, and orders are all accurate and complete.   Total time of encounter: 48 minutes total time of encounter, including 28 minutes spent in face-to-face patient care. This time includes coordination of care and counseling regarding above conditions. Remainder of non-face-to-face time involved reviewing chart documents/testing relevant to the patient encounter and documentation in the medical record.  Jodelle Red, MD, PhD, [facility]  [facility]    Signed, Jodelle Red, MD

## 2022-11-18 ENCOUNTER — Encounter: Payer: Self-pay | Admitting: Physical Therapy

## 2022-11-18 ENCOUNTER — Ambulatory Visit: Payer: Medicare Other | Attending: Sports Medicine | Admitting: Physical Therapy

## 2022-11-18 DIAGNOSIS — M25551 Pain in right hip: Secondary | ICD-10-CM | POA: Diagnosis present

## 2022-11-18 NOTE — Therapy (Signed)
OUTPATIENT PHYSICAL THERAPY LOWER EXTREMITY EVALUATION   Patient Name: Amy Hickman MRN: [id number] DOB:16-Oct-1935, 87 y.o., female Today's Date: 11/18/2022  END OF SESSION:  PT End of Session - 11/18/22 0855     Visit Number 1    Date for PT Re-Evaluation 02/18/23    Authorization Type mcare    PT Start Time 0850    PT Stop Time 0930    PT Time Calculation (min) 40 min    Activity Tolerance Patient tolerated treatment well    Behavior During Therapy [facility] for tasks assessed/performed             Past Medical History:  Diagnosis Date   Atrial fibrillation, chronic (HCC)    s/p multiple cardioversions with sustained NSR, on Eliquis   Celiac disease    Chronic systolic CHF (congestive heart failure) (HCC)    Coronary artery disease involving native coronary artery of native heart without angina pectoris 03/15/2021   GERD (gastroesophageal reflux disease)    HOH (hard of hearing)    Hyperparathyroidism (HCC) 12/12/2020   Hypertension    Interstitial cystitis    Past Surgical History:  Procedure Laterality Date   ABDOMINAL HYSTERECTOMY     APPENDECTOMY     CARDIOVERSION N/A 04/24/2021   Procedure: CARDIOVERSION;  Surgeon: Jake Bathe, MD;  Location: [facility] ENDOSCOPY;  Service: Cardiovascular;  Laterality: N/A;   CARDIOVERSION N/A 06/12/2021   Procedure: CARDIOVERSION;  Surgeon: Jodelle Red, MD;  Location: [facility] ENDOSCOPY;  Service: Cardiovascular;  Laterality: N/A;   CARDIOVERSION N/A 08/11/2021   Procedure: CARDIOVERSION;  Surgeon: Meriam Sprague, MD;  Location: [facility] ENDOSCOPY;  Service: Cardiovascular;  Laterality: N/A;   CHOLECYSTECTOMY     ear drum surgery     REPLACEMENT TOTAL KNEE     ROTATOR CUFF REPAIR Right    TONSILLECTOMY     Patient Active Problem List   Diagnosis Date Noted   Abnormal CT scan, colon 07/18/2022   AKI (acute kidney injury) (HCC) 07/17/2022   Hyperkalemia 07/17/2022   COPD (chronic obstructive pulmonary disease) (HCC)  07/13/2022   Constipation 07/12/2022   Atrial fibrillation (HCC) 03/20/2022   Fracture of right ulna 03/18/2022   Atrial fibrillation with rapid ventricular response (HCC) 03/17/2022   Elevated LFTs 02/05/2022   Malnutrition of moderate degree 12/24/2021   Chest pain 12/23/2021   Atrial fibrillation, chronic (HCC) 12/23/2021   CKD (chronic kidney disease) stage 4, GFR 15-29 ml/min (HCC) 12/23/2021   DNR (do not resuscitate) 12/23/2021   Celiac disease 12/23/2021   Interstitial cystitis 12/23/2021   Secondary hypercoagulable state (HCC) 08/11/2021   Atrial flutter (HCC)    NICM (nonischemic cardiomyopathy) (HCC) 05/30/2021   Venous insufficiency 05/30/2021   Persistent atrial fibrillation (HCC) 05/09/2021   Chronic systolic CHF (congestive heart failure) (HCC) 03/17/2021   Biatrial enlargement 03/17/2021   Sepsis (HCC) 03/16/2021   GERD (gastroesophageal reflux disease)    History of DVT (deep vein thrombosis)    Influenza A 03/15/2021   Anemia 03/15/2021   Atrial fibrillation with RVR (HCC) 03/15/2021   COPD with acute exacerbation (HCC) 03/15/2021   Dark stools 03/15/2021   Hyponatremia 03/15/2021   Hypothyroidism 03/15/2021   Coronary artery disease involving native coronary artery of native heart without angina pectoris 03/15/2021   Essential hypertension 03/15/2021   Hyperlipidemia 03/15/2021   Hyperparathyroidism (HCC) 12/12/2020   Depression 01/03/2019   History of pericarditis 08/06/2016   Generalized osteoarthritis of multiple sites 08/19/2015   Age-related osteoporosis without current pathological fracture 08/14/2015  DVT (deep venous thrombosis) (HCC) 08/14/2015   History of GI bleed 06/15/2014    [facility]  REFERRING PROVIDER: Rodolph Bong, MD  REFERRING DIAG: Right Hip Pain  THERAPY DIAG:  Pain in right hip  Rationale for Evaluation and Treatment: Rehabilitation  ONSET DATE: 11/10/22  SUBJECTIVE:   SUBJECTIVE  STATEMENT: Patient reports that she has been having right hip and pelvic pain and that she is having more and more difficulty walking.  Reports difficulty with standing  PERTINENT HISTORY: Bilateral TKA and TSA PAIN:  Are you having pain? Yes: NPRS scale: 0/10 Pain location: right hip area Pain description: sharp and ache Aggravating factors: standing, walking, raising the right hip and leg up, trying to straighten the leg out pain up to 9/10 Relieving factors: tylenol, rest pain is 0/10  PRECAUTIONS: None  RED FLAGS: None   WEIGHT BEARING RESTRICTIONS: No  FALLS:  Has patient fallen in last 6 months? No  LIVING ENVIRONMENT: Lives with: lives with their family Lives in: House/apartment Stairs:  has a stair lift Has following equipment at home: Environmental consultant - 4 wheeled and stair lift  OCCUPATION: retired  PLOF: Independent with household mobility with device and    PATIENT GOALS: have less pain, stand and move better, walk easier  NEXT MD VISIT: unsure  OBJECTIVE:   DIAGNOSTIC FINDINGS: has OA in the hip, SI areas  PATIENT SURVEYS:  FOTO 32  COGNITION: Overall cognitive status: Within functional limits for tasks assessed     SENSATION: WFL  POSTURE: rounded shoulders, forward head, and decreased lumbar lordosis  PALPATION: Very tender in the left hip area, into the buttock and the thigh area  LOWER EXTREMITY ROM:  Active ROM Right eval Left eval  Hip flexion 60   Hip extension 0   Hip abduction 12   Hip adduction    Hip internal rotation    Hip external rotation    Knee flexion    Knee extension    Ankle dorsiflexion    Ankle plantarflexion    Ankle inversion    Ankle eversion     (Blank rows = not tested)  LOWER EXTREMITY MMT: all right hip MMT was painful  MMT Right eval Left eval  Hip flexion 3+   Hip extension    Hip abduction 4-   Hip adduction 4-   Hip internal rotation 3   Hip external rotation 3   Knee flexion 4   Knee extension 4    Ankle dorsiflexion    Ankle plantarflexion    Ankle inversion    Ankle eversion     (Blank rows = not tested)  FUNCTIONAL TESTS:  Timed up and go (TUG): 40 s with rollator Berg Balance Scale: 29/56  GAIT: Distance walked: 100' Assistive device utilized: Environmental consultant - 4 wheeled Level of assistance: SBA Comments: very slow pattern, right toe in   TODAY'S TREATMENT:  DATE:     PATIENT EDUCATION:  Education details: POC Person educated: Patient Education method: Explanation Education comprehension: verbalized understanding  HOME EXERCISE PROGRAM: Reviewed the HEP from home PT and MD  ASSESSMENT:  CLINICAL IMPRESSION: Patient is a 87 y.o. female who was seen today for physical therapy evaluation and treatment for right hip pain, difficulty walking and standing and poor balance, her TUG was 40 seconds and her Berg balance test score was 29/56 putting her at a very high risk for falls.  Does have right hip weakness and some pain   OBJECTIVE IMPAIRMENTS: Abnormal gait, cardiopulmonary status limiting activity, decreased activity tolerance, decreased balance, decreased endurance, decreased mobility, difficulty walking, decreased ROM, decreased strength, increased muscle spasms, impaired flexibility, improper body mechanics, and pain.   REHAB POTENTIAL: Good  CLINICAL DECISION MAKING: Stable/uncomplicated  EVALUATION COMPLEXITY: Low   GOALS: Goals reviewed with patient? Yes  SHORT TERM GOALS: Target date: 11/30/22 Independent with initial HEP Goal status: INITIAL  LONG TERM GOALS: Target date: 02/18/23  Independent with advanced HEP Goal status: INITIAL  2.  Decrease TUG time to 22 seconds Goal status: INITIAL  3.  Increase BERG to 40/56 Goal status: INITIAL  4.  Increase right hip strength to 4-/5 Goal status: INITIAL  5.  Walk 700 feet  without pain in the hip >5/10 Goal status: INITIAL  6.  Decrease overall pain level 50% Goal status: INITIAL   PLAN:  PT FREQUENCY: 1-2x/week  PT DURATION: 12 weeks  PLANNED INTERVENTIONS: Therapeutic exercises, Therapeutic activity, Neuromuscular re-education, Balance training, Gait training, Patient/Family education, Self Care, Joint mobilization, Joint manipulation, Stair training, Dry Needling, Electrical stimulation, Cryotherapy, Moist heat, Taping, Ultrasound, Ionotophoresis 4mg /ml Dexamethasone, and Manual therapy  PLAN FOR NEXT SESSION: start exercises for strength, function and balance   Eliaz Fout W, PT 11/18/2022, 8:56 AM

## 2022-11-24 ENCOUNTER — Ambulatory Visit: Payer: Medicare Other | Attending: Sports Medicine | Admitting: Physical Therapy

## 2022-11-24 DIAGNOSIS — M25551 Pain in right hip: Secondary | ICD-10-CM | POA: Diagnosis present

## 2022-11-24 NOTE — Therapy (Signed)
OUTPATIENT PHYSICAL THERAPY LOWER EXTREMITY    Patient Name: Amy Hickman MRN: [id number] DOB:06-04-1935, 87 y.o., female Today's Date: 11/24/2022  END OF SESSION:  PT End of Session - 11/24/22 1000     Visit Number 2    Date for PT Re-Evaluation 02/18/23    Authorization Type mcare    PT Start Time 1010    PT Stop Time 1055    PT Time Calculation (min) 45 min             Past Medical History:  Diagnosis Date   Atrial fibrillation, chronic (HCC)    s/p multiple cardioversions with sustained NSR, on Eliquis   Celiac disease    Chronic systolic CHF (congestive heart failure) (HCC)    Coronary artery disease involving native coronary artery of native heart without angina pectoris 03/15/2021   GERD (gastroesophageal reflux disease)    HOH (hard of hearing)    Hyperparathyroidism (HCC) 12/12/2020   Hypertension    Interstitial cystitis    Past Surgical History:  Procedure Laterality Date   ABDOMINAL HYSTERECTOMY     APPENDECTOMY     CARDIOVERSION N/A 04/24/2021   Procedure: CARDIOVERSION;  Surgeon: Jake Bathe, MD;  Location: [facility] ENDOSCOPY;  Service: Cardiovascular;  Laterality: N/A;   CARDIOVERSION N/A 06/12/2021   Procedure: CARDIOVERSION;  Surgeon: Jodelle Red, MD;  Location: [facility] ENDOSCOPY;  Service: Cardiovascular;  Laterality: N/A;   CARDIOVERSION N/A 08/11/2021   Procedure: CARDIOVERSION;  Surgeon: Meriam Sprague, MD;  Location: [facility] ENDOSCOPY;  Service: Cardiovascular;  Laterality: N/A;   CHOLECYSTECTOMY     ear drum surgery     REPLACEMENT TOTAL KNEE     ROTATOR CUFF REPAIR Right    TONSILLECTOMY     Patient Active Problem List   Diagnosis Date Noted   Abnormal CT scan, colon 07/18/2022   AKI (acute kidney injury) (HCC) 07/17/2022   Hyperkalemia 07/17/2022   COPD (chronic obstructive pulmonary disease) (HCC) 07/13/2022   Constipation 07/12/2022   Atrial fibrillation (HCC) 03/20/2022   Fracture of right ulna 03/18/2022   Atrial  fibrillation with rapid ventricular response (HCC) 03/17/2022   Elevated LFTs 02/05/2022   Malnutrition of moderate degree 12/24/2021   Chest pain 12/23/2021   Atrial fibrillation, chronic (HCC) 12/23/2021   CKD (chronic kidney disease) stage 4, GFR 15-29 ml/min (HCC) 12/23/2021   DNR (do not resuscitate) 12/23/2021   Celiac disease 12/23/2021   Interstitial cystitis 12/23/2021   Secondary hypercoagulable state (HCC) 08/11/2021   Atrial flutter (HCC)    NICM (nonischemic cardiomyopathy) (HCC) 05/30/2021   Venous insufficiency 05/30/2021   Persistent atrial fibrillation (HCC) 05/09/2021   Chronic systolic CHF (congestive heart failure) (HCC) 03/17/2021   Biatrial enlargement 03/17/2021   Sepsis (HCC) 03/16/2021   GERD (gastroesophageal reflux disease)    History of DVT (deep vein thrombosis)    Influenza A 03/15/2021   Anemia 03/15/2021   Atrial fibrillation with RVR (HCC) 03/15/2021   COPD with acute exacerbation (HCC) 03/15/2021   Dark stools 03/15/2021   Hyponatremia 03/15/2021   Hypothyroidism 03/15/2021   Coronary artery disease involving native coronary artery of native heart without angina pectoris 03/15/2021   Essential hypertension 03/15/2021   Hyperlipidemia 03/15/2021   Hyperparathyroidism (HCC) 12/12/2020   Depression 01/03/2019   History of pericarditis 08/06/2016   Generalized osteoarthritis of multiple sites 08/19/2015   Age-related osteoporosis without current pathological fracture 08/14/2015   DVT (deep venous thrombosis) (HCC) 08/14/2015   History of GI bleed 06/15/2014    PCP:  [facility]  REFERRING PROVIDER: Rodolph Bong, MD  REFERRING DIAG: Right Hip Pain  THERAPY DIAG:  Pain in right hip  Rationale for Evaluation and Treatment: Rehabilitation  ONSET DATE: 11/10/22  SUBJECTIVE:   SUBJECTIVE STATEMENT: doing okay. Buttock is sore but I think its because I sleep in chair- beds are so high I can't get in them   PERTINENT  HISTORY: Bilateral TKA and TSA PAIN:  Are you having pain? Yes: NPRS scale: 3/10 Pain location: right hip area Pain description: sharp and ache Aggravating factors: standing, walking, raising the right hip and leg up, trying to straighten the leg out pain up to 9/10 Relieving factors: tylenol, rest pain is 0/10  PRECAUTIONS: None  RED FLAGS: None   WEIGHT BEARING RESTRICTIONS: No  FALLS:  Has patient fallen in last 6 months? No  LIVING ENVIRONMENT: Lives with: lives with their family Lives in: House/apartment Stairs:  has a stair lift Has following equipment at home: Environmental consultant - 4 wheeled and stair lift  OCCUPATION: retired  PLOF: Independent with household mobility with device and    PATIENT GOALS: have less pain, stand and move better, walk easier  NEXT MD VISIT: unsure  OBJECTIVE:   DIAGNOSTIC FINDINGS: has OA in the hip, SI areas  PATIENT SURVEYS:  FOTO 32  COGNITION: Overall cognitive status: Within functional limits for tasks assessed     SENSATION: WFL  POSTURE: rounded shoulders, forward head, and decreased lumbar lordosis  PALPATION: Very tender in the left hip area, into the buttock and the thigh area  LOWER EXTREMITY ROM:  Active ROM Right eval Left eval  Hip flexion 60   Hip extension 0   Hip abduction 12   Hip adduction    Hip internal rotation    Hip external rotation    Knee flexion    Knee extension    Ankle dorsiflexion    Ankle plantarflexion    Ankle inversion    Ankle eversion     (Blank rows = not tested)  LOWER EXTREMITY MMT: all right hip MMT was painful  MMT Right eval Left eval  Hip flexion 3+   Hip extension    Hip abduction 4-   Hip adduction 4-   Hip internal rotation 3   Hip external rotation 3   Knee flexion 4   Knee extension 4   Ankle dorsiflexion    Ankle plantarflexion    Ankle inversion    Ankle eversion     (Blank rows = not tested)  FUNCTIONAL TESTS:  Timed up and go (TUG): 40 s with  rollator Berg Balance Scale: 29/56  GAIT: Distance walked: 100' Assistive device utilized: Environmental consultant - 4 wheeled Level of assistance: SBA Comments: very slow pattern, right toe in   TODAY'S TREATMENT:  DATE:    11/24/22 Nustep L 5 STS without UE 10 x Feet in ball bridge hold 3 sec 10 x, KTC, obl and isometric abdominals  Supine ADD ball squeeze, clams and hip flex 2 sets 10 red tband,tband trunk rotations 3# ankle wts HHA marching fwd and back 12 feet 2 x then laterally- min A needed with posterior lean 3# HHA 4 inch alt step taps- min A needed Ball toss and taps on airex - min A with LOB     PATIENT EDUCATION:  Education details: POC Person educated: Patient Education method: Explanation Education comprehension: verbalized understanding  HOME EXERCISE PROGRAM: Reviewed the HEP from home PT and MD  ASSESSMENT:  CLINICAL IMPRESSION: Pt tolerated initial progression of strength and balance ex well. Cuing needed for speed and control of mvmt OBJECTIVE IMPAIRMENTS: Abnormal gait, cardiopulmonary status limiting activity, decreased activity tolerance, decreased balance, decreased endurance, decreased mobility, difficulty walking, decreased ROM, decreased strength, increased muscle spasms, impaired flexibility, improper body mechanics, and pain.   REHAB POTENTIAL: Good  CLINICAL DECISION MAKING: Stable/uncomplicated  EVALUATION COMPLEXITY: Low   GOALS: Goals reviewed with patient? Yes  SHORT TERM GOALS: Target date: 11/30/22 Independent with initial HEP Goal status: INITIAL  LONG TERM GOALS: Target date: 02/18/23  Independent with advanced HEP Goal status: INITIAL  2.  Decrease TUG time to 22 seconds Goal status: INITIAL  3.  Increase BERG to 40/56 Goal status: INITIAL  4.  Increase right hip strength to 4-/5 Goal status: INITIAL  5.   Walk 700 feet without pain in the hip >5/10 Goal status: INITIAL  6.  Decrease overall pain level 50% Goal status: INITIAL   PLAN:  PT FREQUENCY: 1-2x/week  PT DURATION: 12 weeks  PLANNED INTERVENTIONS: Therapeutic exercises, Therapeutic activity, Neuromuscular re-education, Balance training, Gait training, Patient/Family education, Self Care, Joint mobilization, Joint manipulation, Stair training, Dry Needling, Electrical stimulation, Cryotherapy, Moist heat, Taping, Ultrasound, Ionotophoresis 4mg /ml Dexamethasone, and Manual therapy  PLAN FOR NEXT SESSION: start exercises for strength, function and balance   ,ANGIE, PTA 11/24/2022, 10:01 AM [facility] [address] [facility] [place], [place], [address] Phone: [phone]   Fax:  [phone]  Patient Details  Name: LIESE HILLING MRN: [id number] Date of Birth: June 08, 1935 Referring Provider:  Delfin Gant, MD  Encounter Date: 11/24/2022   Suanne Marker, PTA 11/24/2022, 10:01 AM  [facility] [address] [place], [place], [address] Phone: [phone]   Fax:  [phone]

## 2022-11-26 ENCOUNTER — Ambulatory Visit: Payer: Medicare Other | Admitting: Physical Therapy

## 2022-11-26 ENCOUNTER — Ambulatory Visit: Payer: Medicare Other | Admitting: Physician Assistant

## 2022-11-30 NOTE — Therapy (Signed)
OUTPATIENT PHYSICAL THERAPY LOWER EXTREMITY    Patient Name: Amy Hickman MRN: [id number] DOB:Aug 26, 1935, 87 y.o., female Today's Date: 12/01/2022  END OF SESSION:  PT End of Session - 12/01/22 1004     Visit Number 3    Date for PT Re-Evaluation 02/18/23    Authorization Type mcare    PT Start Time 1005    PT Stop Time 1050    PT Time Calculation (min) 45 min    Activity Tolerance Patient tolerated treatment well    Behavior During Therapy WFL for tasks assessed/performed              Past Medical History:  Diagnosis Date   Atrial fibrillation, chronic (HCC)    s/p multiple cardioversions with sustained NSR, on Eliquis   Celiac disease    Chronic systolic CHF (congestive heart failure) (HCC)    Coronary artery disease involving native coronary artery of native heart without angina pectoris 03/15/2021   GERD (gastroesophageal reflux disease)    HOH (hard of hearing)    Hyperparathyroidism (HCC) 12/12/2020   Hypertension    Interstitial cystitis    Past Surgical History:  Procedure Laterality Date   ABDOMINAL HYSTERECTOMY     APPENDECTOMY     CARDIOVERSION N/A 04/24/2021   Procedure: CARDIOVERSION;  Surgeon: Jake Bathe, MD;  Location: [facility] ENDOSCOPY;  Service: Cardiovascular;  Laterality: N/A;   CARDIOVERSION N/A 06/12/2021   Procedure: CARDIOVERSION;  Surgeon: Jodelle Red, MD;  Location: [facility] ENDOSCOPY;  Service: Cardiovascular;  Laterality: N/A;   CARDIOVERSION N/A 08/11/2021   Procedure: CARDIOVERSION;  Surgeon: Meriam Sprague, MD;  Location: [facility] ENDOSCOPY;  Service: Cardiovascular;  Laterality: N/A;   CHOLECYSTECTOMY     ear drum surgery     REPLACEMENT TOTAL KNEE     ROTATOR CUFF REPAIR Right    TONSILLECTOMY     Patient Active Problem List   Diagnosis Date Noted   Abnormal CT scan, colon 07/18/2022   AKI (acute kidney injury) (HCC) 07/17/2022   Hyperkalemia 07/17/2022   COPD (chronic obstructive pulmonary disease) (HCC) 07/13/2022    Constipation 07/12/2022   Atrial fibrillation (HCC) 03/20/2022   Fracture of right ulna 03/18/2022   Atrial fibrillation with rapid ventricular response (HCC) 03/17/2022   Elevated LFTs 02/05/2022   Malnutrition of moderate degree 12/24/2021   Chest pain 12/23/2021   Atrial fibrillation, chronic (HCC) 12/23/2021   CKD (chronic kidney disease) stage 4, GFR 15-29 ml/min (HCC) 12/23/2021   DNR (do not resuscitate) 12/23/2021   Celiac disease 12/23/2021   Interstitial cystitis 12/23/2021   Secondary hypercoagulable state (HCC) 08/11/2021   Atrial flutter (HCC)    NICM (nonischemic cardiomyopathy) (HCC) 05/30/2021   Venous insufficiency 05/30/2021   Persistent atrial fibrillation (HCC) 05/09/2021   Chronic systolic CHF (congestive heart failure) (HCC) 03/17/2021   Biatrial enlargement 03/17/2021   Sepsis (HCC) 03/16/2021   GERD (gastroesophageal reflux disease)    History of DVT (deep vein thrombosis)    Influenza A 03/15/2021   Anemia 03/15/2021   Atrial fibrillation with RVR (HCC) 03/15/2021   COPD with acute exacerbation (HCC) 03/15/2021   Dark stools 03/15/2021   Hyponatremia 03/15/2021   Hypothyroidism 03/15/2021   Coronary artery disease involving native coronary artery of native heart without angina pectoris 03/15/2021   Essential hypertension 03/15/2021   Hyperlipidemia 03/15/2021   Hyperparathyroidism (HCC) 12/12/2020   Depression 01/03/2019   History of pericarditis 08/06/2016   Generalized osteoarthritis of multiple sites 08/19/2015   Age-related osteoporosis without current pathological fracture  08/14/2015   DVT (deep venous thrombosis) (HCC) 08/14/2015   History of GI bleed 06/15/2014    [facility]  REFERRING PROVIDER: Rodolph Bong, MD  REFERRING DIAG: Right Hip Pain  THERAPY DIAG:  Pain in right hip  Rationale for Evaluation and Treatment: Rehabilitation  ONSET DATE: 11/10/22  SUBJECTIVE:   SUBJECTIVE STATEMENT: I am doing better. I  need to get back in the bed, I have been sleeping in a chair. I have been walking more on my own.    PERTINENT HISTORY: Bilateral TKA and TSA PAIN:  Are you having pain? Yes: NPRS scale: 3/10 Pain location: right hip area Pain description: sharp and ache Aggravating factors: standing, walking, raising the right hip and leg up, trying to straighten the leg out pain up to 9/10 Relieving factors: tylenol, rest pain is 0/10  PRECAUTIONS: None  RED FLAGS: None   WEIGHT BEARING RESTRICTIONS: No  FALLS:  Has patient fallen in last 6 months? No  LIVING ENVIRONMENT: Lives with: lives with their family Lives in: House/apartment Stairs:  has a stair lift Has following equipment at home: Environmental consultant - 4 wheeled and stair lift  OCCUPATION: retired  PLOF: Independent with household mobility with device and    PATIENT GOALS: have less pain, stand and move better, walk easier  NEXT MD VISIT: unsure  OBJECTIVE:   DIAGNOSTIC FINDINGS: has OA in the hip, SI areas  PATIENT SURVEYS:  FOTO 32  COGNITION: Overall cognitive status: Within functional limits for tasks assessed     SENSATION: WFL  POSTURE: rounded shoulders, forward head, and decreased lumbar lordosis  PALPATION: Very tender in the left hip area, into the buttock and the thigh area  LOWER EXTREMITY ROM:  Active ROM Right eval Left eval  Hip flexion 60   Hip extension 0   Hip abduction 12   Hip adduction    Hip internal rotation    Hip external rotation    Knee flexion    Knee extension    Ankle dorsiflexion    Ankle plantarflexion    Ankle inversion    Ankle eversion     (Blank rows = not tested)  LOWER EXTREMITY MMT: all right hip MMT was painful  MMT Right eval Left eval  Hip flexion 3+   Hip extension    Hip abduction 4-   Hip adduction 4-   Hip internal rotation 3   Hip external rotation 3   Knee flexion 4   Knee extension 4   Ankle dorsiflexion    Ankle plantarflexion    Ankle inversion     Ankle eversion     (Blank rows = not tested)  FUNCTIONAL TESTS:  Timed up and go (TUG): 40 s with rollator Berg Balance Scale: 29/56  GAIT: Distance walked: 100' Assistive device utilized: Environmental consultant - 4 wheeled Level of assistance: SBA Comments: very slow pattern, right toe in   TODAY'S TREATMENT:  DATE:   11/30/22 NuStep L5x26mins  LAQ 2#  Standing marches 2# 20 reps alt x2  Standing hip abd 2x10  HS curls red 2x10   Hip abd seated green band 2x10  Bridges 2x10 Supine clamshells green 2x10 SLR x10  Feet on pball rotaitons, bridges, knees to chest STS 2x10   11/24/22 Nustep L 5 STS without UE 10 x Feet in ball bridge hold 3 sec 10 x, KTC, obl and isometric abdominals  Supine ADD ball squeeze, clams and hip flex 2 sets 10 red tband,tband trunk rotations 3# ankle wts HHA marching fwd and back 12 feet 2 x then laterally- min A needed with posterior lean 3# HHA 4 inch alt step taps- min A needed Ball toss and taps on airex - min A with LOB     PATIENT EDUCATION:  Education details: POC Person educated: Patient Education method: Explanation Education comprehension: verbalized understanding  HOME EXERCISE PROGRAM: Reviewed the HEP from home PT and MD  ASSESSMENT:  CLINICAL IMPRESSION: Pt tolerated continued progression of strength and balance exercises well. Some difficulty with hip abduction when standing on RLE. Cues needed for control of movement.    OBJECTIVE IMPAIRMENTS: Abnormal gait, cardiopulmonary status limiting activity, decreased activity tolerance, decreased balance, decreased endurance, decreased mobility, difficulty walking, decreased ROM, decreased strength, increased muscle spasms, impaired flexibility, improper body mechanics, and pain.   REHAB POTENTIAL: Good  CLINICAL DECISION MAKING: Stable/uncomplicated  EVALUATION  COMPLEXITY: Low   GOALS: Goals reviewed with patient? Yes  SHORT TERM GOALS: Target date: 11/30/22 Independent with initial HEP Goal status: INITIAL  LONG TERM GOALS: Target date: 02/18/23  Independent with advanced HEP Goal status: INITIAL  2.  Decrease TUG time to 22 seconds Goal status: INITIAL  3.  Increase BERG to 40/56 Goal status: INITIAL  4.  Increase right hip strength to 4-/5 Goal status: INITIAL  5.  Walk 700 feet without pain in the hip >5/10 Goal status: INITIAL  6.  Decrease overall pain level 50% Goal status: INITIAL   PLAN:  PT FREQUENCY: 1-2x/week  PT DURATION: 12 weeks  PLANNED INTERVENTIONS: Therapeutic exercises, Therapeutic activity, Neuromuscular re-education, Balance training, Gait training, Patient/Family education, Self Care, Joint mobilization, Joint manipulation, Stair training, Dry Needling, Electrical stimulation, Cryotherapy, Moist heat, Taping, Ultrasound, Ionotophoresis 4mg /ml Dexamethasone, and Manual therapy  PLAN FOR NEXT SESSION: start exercises for strength, function and balance   Cassie Freer, PT 12/01/2022, 10:49 AM [facility] at [facility] [address] [facility] [place], [place], [address] Phone: [phone]   Fax:  [phone]  Patient Details  Name: Amy Hickman MRN: [id number] Date of Birth: March 07, 1936 Referring Provider:  Delfin Gant, MD  Encounter Date: 12/01/2022   Cassie Freer, PT 12/01/2022, 10:49 AM  [place] [facility] at [facility] [address] [place], [place], [address] Phone: [phone]   Fax:  [phone]

## 2022-12-01 ENCOUNTER — Ambulatory Visit: Payer: Medicare Other

## 2022-12-01 DIAGNOSIS — M25551 Pain in right hip: Secondary | ICD-10-CM

## 2022-12-03 ENCOUNTER — Ambulatory Visit: Payer: Medicare Other | Admitting: Physical Therapy

## 2022-12-04 ENCOUNTER — Other Ambulatory Visit: Payer: Self-pay | Admitting: Internal Medicine

## 2022-12-04 DIAGNOSIS — R110 Nausea: Secondary | ICD-10-CM

## 2022-12-08 ENCOUNTER — Other Ambulatory Visit (HOSPITAL_BASED_OUTPATIENT_CLINIC_OR_DEPARTMENT_OTHER): Payer: Self-pay

## 2022-12-08 ENCOUNTER — Other Ambulatory Visit (HOSPITAL_COMMUNITY): Payer: Self-pay

## 2022-12-08 ENCOUNTER — Ambulatory Visit: Payer: Medicare Other | Admitting: Physical Therapy

## 2022-12-08 DIAGNOSIS — I10 Essential (primary) hypertension: Secondary | ICD-10-CM

## 2022-12-08 DIAGNOSIS — I428 Other cardiomyopathies: Secondary | ICD-10-CM

## 2022-12-08 DIAGNOSIS — I480 Paroxysmal atrial fibrillation: Secondary | ICD-10-CM

## 2022-12-08 MED ORDER — CARVEDILOL 12.5 MG PO TABS
12.5000 mg | ORAL_TABLET | Freq: Two times a day (BID) | ORAL | 3 refills | Status: AC
  Filled 2022-12-08 – 2023-02-05 (×2): qty 180, 90d supply, fill #0
  Filled 2023-05-13 – 2023-06-18 (×2): qty 180, 90d supply, fill #1

## 2022-12-10 ENCOUNTER — Ambulatory Visit: Payer: Medicare Other | Admitting: Physical Therapy

## 2022-12-15 ENCOUNTER — Ambulatory Visit: Payer: Medicare Other | Admitting: Physical Therapy

## 2022-12-17 ENCOUNTER — Ambulatory Visit: Payer: Medicare Other | Admitting: Physical Therapy

## 2022-12-22 ENCOUNTER — Ambulatory Visit: Payer: Medicare Other | Attending: Sports Medicine | Admitting: Physical Therapy

## 2022-12-22 ENCOUNTER — Other Ambulatory Visit: Payer: Self-pay

## 2022-12-22 ENCOUNTER — Emergency Department (HOSPITAL_BASED_OUTPATIENT_CLINIC_OR_DEPARTMENT_OTHER)
Admission: EM | Admit: 2022-12-22 | Discharge: 2022-12-22 | Disposition: A | Discharge status: Home / Self Care | Payer: Medicare Other | Attending: Emergency Medicine | Admitting: Emergency Medicine

## 2022-12-22 ENCOUNTER — Emergency Department (HOSPITAL_BASED_OUTPATIENT_CLINIC_OR_DEPARTMENT_OTHER): Payer: Medicare Other

## 2022-12-22 ENCOUNTER — Encounter (HOSPITAL_BASED_OUTPATIENT_CLINIC_OR_DEPARTMENT_OTHER): Payer: Self-pay | Admitting: Emergency Medicine

## 2022-12-22 DIAGNOSIS — S0083XA Contusion of other part of head, initial encounter: Secondary | ICD-10-CM | POA: Diagnosis not present

## 2022-12-22 DIAGNOSIS — M25551 Pain in right hip: Secondary | ICD-10-CM | POA: Diagnosis present

## 2022-12-22 DIAGNOSIS — I509 Heart failure, unspecified: Secondary | ICD-10-CM | POA: Diagnosis not present

## 2022-12-22 DIAGNOSIS — I2510 Atherosclerotic heart disease of native coronary artery without angina pectoris: Secondary | ICD-10-CM | POA: Diagnosis not present

## 2022-12-22 DIAGNOSIS — I110 Hypertensive heart disease with heart failure: Secondary | ICD-10-CM | POA: Insufficient documentation

## 2022-12-22 DIAGNOSIS — Z7901 Long term (current) use of anticoagulants: Secondary | ICD-10-CM | POA: Diagnosis not present

## 2022-12-22 DIAGNOSIS — W0110XA Fall on same level from slipping, tripping and stumbling with subsequent striking against unspecified object, initial encounter: Secondary | ICD-10-CM | POA: Insufficient documentation

## 2022-12-22 DIAGNOSIS — R519 Headache, unspecified: Secondary | ICD-10-CM | POA: Diagnosis present

## 2022-12-22 DIAGNOSIS — S0093XA Contusion of unspecified part of head, initial encounter: Secondary | ICD-10-CM

## 2022-12-22 NOTE — Therapy (Signed)
OUTPATIENT PHYSICAL THERAPY LOWER EXTREMITY    Patient Name: Amy Hickman MRN: [id number] DOB:07-Oct-1935, 87 y.o., female Today's Date: 12/22/2022  END OF SESSION:  PT End of Session - 12/22/22 1011     Visit Number 4    Date for PT Re-Evaluation 02/18/23    Authorization Type mcare    PT Start Time 1012    PT Stop Time 1100    PT Time Calculation (min) 48 min              Past Medical History:  Diagnosis Date   Atrial fibrillation, chronic (HCC)    s/p multiple cardioversions with sustained NSR, on Eliquis   Celiac disease    Chronic systolic CHF (congestive heart failure) (HCC)    Coronary artery disease involving native coronary artery of native heart without angina pectoris 03/15/2021   GERD (gastroesophageal reflux disease)    HOH (hard of hearing)    Hyperparathyroidism (HCC) 12/12/2020   Hypertension    Interstitial cystitis    Past Surgical History:  Procedure Laterality Date   ABDOMINAL HYSTERECTOMY     APPENDECTOMY     CARDIOVERSION N/A 04/24/2021   Procedure: CARDIOVERSION;  Surgeon: Jake Bathe, MD;  Location: [facility] ENDOSCOPY;  Service: Cardiovascular;  Laterality: N/A;   CARDIOVERSION N/A 06/12/2021   Procedure: CARDIOVERSION;  Surgeon: Jodelle Red, MD;  Location: [facility] ENDOSCOPY;  Service: Cardiovascular;  Laterality: N/A;   CARDIOVERSION N/A 08/11/2021   Procedure: CARDIOVERSION;  Surgeon: Meriam Sprague, MD;  Location: [facility] ENDOSCOPY;  Service: Cardiovascular;  Laterality: N/A;   CHOLECYSTECTOMY     ear drum surgery     REPLACEMENT TOTAL KNEE     ROTATOR CUFF REPAIR Right    TONSILLECTOMY     Patient Active Problem List   Diagnosis Date Noted   Abnormal CT scan, colon 07/18/2022   AKI (acute kidney injury) (HCC) 07/17/2022   Hyperkalemia 07/17/2022   COPD (chronic obstructive pulmonary disease) (HCC) 07/13/2022   Constipation 07/12/2022   Atrial fibrillation (HCC) 03/20/2022   Fracture of right ulna 03/18/2022   Atrial  fibrillation with rapid ventricular response (HCC) 03/17/2022   Elevated LFTs 02/05/2022   Malnutrition of moderate degree 12/24/2021   Chest pain 12/23/2021   Atrial fibrillation, chronic (HCC) 12/23/2021   CKD (chronic kidney disease) stage 4, GFR 15-29 ml/min (HCC) 12/23/2021   DNR (do not resuscitate) 12/23/2021   Celiac disease 12/23/2021   Interstitial cystitis 12/23/2021   Secondary hypercoagulable state (HCC) 08/11/2021   Atrial flutter (HCC)    NICM (nonischemic cardiomyopathy) (HCC) 05/30/2021   Venous insufficiency 05/30/2021   Persistent atrial fibrillation (HCC) 05/09/2021   Chronic systolic CHF (congestive heart failure) (HCC) 03/17/2021   Biatrial enlargement 03/17/2021   Sepsis (HCC) 03/16/2021   GERD (gastroesophageal reflux disease)    History of DVT (deep vein thrombosis)    Influenza A 03/15/2021   Anemia 03/15/2021   Atrial fibrillation with RVR (HCC) 03/15/2021   COPD with acute exacerbation (HCC) 03/15/2021   Dark stools 03/15/2021   Hyponatremia 03/15/2021   Hypothyroidism 03/15/2021   Coronary artery disease involving native coronary artery of native heart without angina pectoris 03/15/2021   Essential hypertension 03/15/2021   Hyperlipidemia 03/15/2021   Hyperparathyroidism (HCC) 12/12/2020   Depression 01/03/2019   History of pericarditis 08/06/2016   Generalized osteoarthritis of multiple sites 08/19/2015   Age-related osteoporosis without current pathological fracture 08/14/2015   DVT (deep venous thrombosis) (HCC) 08/14/2015   History of GI bleed 06/15/2014  [facility]  REFERRING PROVIDER: Rodolph Bong, MD  REFERRING DIAG: Right Hip Pain  THERAPY DIAG:  Pain in right hip  Rationale for Evaluation and Treatment: Rehabilitation  ONSET DATE: 11/10/22  SUBJECTIVE:   SUBJECTIVE STATEMENT: pt had a fall since last visit. Knot on head and bruised RT shld and cerv area. Could a couple weeks off to heal and help soreness.  Thrown off rollator by family using as w/c. Stated doing better but no HEP since.  PERTINENT HISTORY: Bilateral TKA and TSA PAIN:  Are you having pain? Yes: NPRS scale: 2/10 Pain location: right hip area Pain description: sharp and ache Aggravating factors: standing, walking, raising the right hip and leg up, trying to straighten the leg out pain up to 9/10 Relieving factors: tylenol, rest pain is 0/10  PRECAUTIONS: None  RED FLAGS: None   WEIGHT BEARING RESTRICTIONS: No  FALLS:  Has patient fallen in last 6 months? No  LIVING ENVIRONMENT: Lives with: lives with their family Lives in: House/apartment Stairs:  has a stair lift Has following equipment at home: Environmental consultant - 4 wheeled and stair lift  OCCUPATION: retired  PLOF: Independent with household mobility with device and    PATIENT GOALS: have less pain, stand and move better, walk easier  NEXT MD VISIT: unsure  OBJECTIVE:   DIAGNOSTIC FINDINGS: has OA in the hip, SI areas  PATIENT SURVEYS:  FOTO 32  COGNITION: Overall cognitive status: Within functional limits for tasks assessed     SENSATION: WFL  POSTURE: rounded shoulders, forward head, and decreased lumbar lordosis  PALPATION: Very tender in the left hip area, into the buttock and the thigh area  LOWER EXTREMITY ROM:  Active ROM Right eval Left eval  Hip flexion 60   Hip extension 0   Hip abduction 12   Hip adduction    Hip internal rotation    Hip external rotation    Knee flexion    Knee extension    Ankle dorsiflexion    Ankle plantarflexion    Ankle inversion    Ankle eversion     (Blank rows = not tested)  LOWER EXTREMITY MMT: all right hip MMT was painful  MMT Right eval Left eval  Hip flexion 3+   Hip extension    Hip abduction 4-   Hip adduction 4-   Hip internal rotation 3   Hip external rotation 3   Knee flexion 4   Knee extension 4   Ankle dorsiflexion    Ankle plantarflexion    Ankle inversion    Ankle eversion      (Blank rows = not tested)  FUNCTIONAL TESTS:  Timed up and go (TUG): 40 s with rollator Berg Balance Scale: 29/56  GAIT: Distance walked: 100' Assistive device utilized: Environmental consultant - 4 wheeled Level of assistance: SBA Comments: very slow pattern, right toe in   TODAY'S TREATMENT:  DATE:    12/22/22  Nustep L 5 6 min TUG and assessed other goals Amb without AD 100 feet CGA , short steps and IR BIL- cautioned to be careful at home without AD as she is a bit unsteady and she states stays close to walls/furniture Standing with RW red tband 15x hip flex,ext and abd BIL Red tband LAQ 2 sets 10 Green tband HS curl 2 sets 10 STS on airex CG A 2 sets 5    11/30/22 NuStep L5x69mins  LAQ 2#  Standing marches 2# 20 reps alt x2  Standing hip abd 2x10  HS curls red 2x10   Hip abd seated green band 2x10  Bridges 2x10 Supine clamshells green 2x10 SLR x10  Feet on pball rotaitons, bridges, knees to chest STS 2x10   11/24/22 Nustep L 5 STS without UE 10 x Feet in ball bridge hold 3 sec 10 x, KTC, obl and isometric abdominals  Supine ADD ball squeeze, clams and hip flex 2 sets 10 red tband,tband trunk rotations 3# ankle wts HHA marching fwd and back 12 feet 2 x then laterally- min A needed with posterior lean 3# HHA 4 inch alt step taps- min A needed Ball toss and taps on airex - min A with LOB     PATIENT EDUCATION:  Education details: POC Person educated: Patient Education method: Explanation Education comprehension: verbalized understanding  HOME EXERCISE PROGRAM: Reviewed the HEP from home PT and MD  ASSESSMENT:  CLINICAL IMPRESSION: Pt arrived back to therapy after a couple weeks d/t fall. Pt tolerated well but very fatigued. Goals assessed and documented  OBJECTIVE IMPAIRMENTS: Abnormal gait, cardiopulmonary status limiting activity, decreased  activity tolerance, decreased balance, decreased endurance, decreased mobility, difficulty walking, decreased ROM, decreased strength, increased muscle spasms, impaired flexibility, improper body mechanics, and pain.   REHAB POTENTIAL: Good  CLINICAL DECISION MAKING: Stable/uncomplicated  EVALUATION COMPLEXITY: Low   GOALS: Goals reviewed with patient? Yes  SHORT TERM GOALS: Target date: 11/30/22 Independent with initial HEP Goal status: INITIAL  LONG TERM GOALS: Target date: 02/18/23  Independent with advanced HEP Goal status: INITIAL  2.  Decrease TUG time to 22 seconds Goal status: 21.44 sec MET  3.  Increase BERG to 40/56 Goal status: INITIAL  4.  Increase right hip strength to 4-/5 Goal status: 12/22/22 progressing  5.  Walk 700 feet without pain in the hip >5/10 Goal status: 12/22/22 progressing  6.  Decrease overall pain level 50% Goal status: 12/22/22 40-50% worse at night   PLAN:  PT FREQUENCY: 1-2x/week  PT DURATION: 12 weeks  PLANNED INTERVENTIONS: Therapeutic exercises, Therapeutic activity, Neuromuscular re-education, Balance training, Gait training, Patient/Family education, Self Care, Joint mobilization, Joint manipulation, Stair training, Dry Needling, Electrical stimulation, Cryotherapy, Moist heat, Taping, Ultrasound, Ionotophoresis 4mg /ml Dexamethasone, and Manual therapy  PLAN FOR NEXT SESSION: progress exercises for strength, function and balance   Jeramie Scogin,ANGIE, PTA 12/22/2022, 10:11 AM [facility] [facility] at [facility] [address] [facility] [place], [place], [address] Phone: [phone]   Fax:  [phone]  Patient Details  Name: Amy Hickman MRN: [id number] Date of Birth: 1936-02-22 Referring Provider:  Delfin Gant, MD  Encounter Date: 12/22/2022

## 2022-12-22 NOTE — ED Provider Notes (Signed)
[place] [facility] Provider Note   CSN: [id number] Arrival date & time: 12/22/22  1450     History  Chief Complaint  Patient presents with   Amy Hickman is a 87 y.o. female.  Pt is a 87 yo female with pmhx significant for htn, gerd, afib (on Eliquis), chf, and cad.  Pt said she fell about 2 weeks ago because her walker collapsed.  She is on eliquis.  She still has a hematoma and family is concerned about bleeding into her brain.  She is currently getting PT because of recent pelvic injury.  Pt denies f/c.         Home Medications Prior to Admission medications   Medication Sig Start Date End Date Taking? Authorizing Provider  acetaminophen (TYLENOL) 500 MG tablet Take 1 tablet (500 mg total) by mouth every 6 (six) hours as needed for pain. Patient taking differently: Take 1,000 mg by mouth as needed for pain or moderate pain. 06/24/12   Gerhard Munch, MD  amiodarone (PACERONE) 200 MG tablet Take 1 tablet (200 mg total) by mouth daily. 11/04/22   Jodelle Red, MD  apixaban (ELIQUIS) 5 MG TABS tablet Take 1 tablet (5 mg total) by mouth 2 (two) times daily. 06/23/22   Jodelle Red, MD  carvedilol (COREG) 12.5 MG tablet Take 1 tablet (12.5 mg total) by mouth 2 (two) times daily. 12/08/22   Jodelle Red, MD  cefdinir (OMNICEF) 300 MG capsule Take 1 capsule (300 mg total) by mouth daily. 10/19/22   Jeannie Fend, PA-C  clidinium-chlordiazePOXIDE (LIBRAX) 5-2.5 MG capsule Take 2 capsules by mouth daily as needed (cramping). 02/28/21   [provider]  CRANBERRY-VITAMIN C PO Take 1 capsule by mouth in the morning.    [provider]  diltiazem (CARDIZEM CD) 360 MG 24 hr capsule Take 1 capsule (360 mg total) by mouth daily. 06/12/21   Jodelle Red, MD  esomeprazole (NEXIUM) 40 MG capsule Take 40 mg by mouth daily before breakfast.    [provider]  fenofibrate 54 MG tablet  Take 54 mg by mouth daily. 07/15/22   [provider]  hydrALAZINE (APRESOLINE) 100 MG tablet Take 1 tablet (100 mg total) by mouth 3 (three) times daily. 11/04/22   Jodelle Red, MD  Hyoscyamine Sulfate SL (OSCIMIN) 0.125 MG SUBL Take 0.125 mg by mouth as needed (cramping). 12/05/21   [provider]  isosorbide dinitrate (ISORDIL) 10 MG tablet Take 10 mg by mouth 2 (two) times daily. 12/31/21   [provider]  latanoprost (XALATAN) 0.005 % ophthalmic solution Place 1 drop into both eyes at bedtime. 08/31/22   [provider]  levothyroxine (SYNTHROID) 112 MCG tablet Take 112 mcg by mouth daily before breakfast. 07/24/22   [provider]  ondansetron (ZOFRAN) 4 MG tablet TAKE 1 TABLET EVERY 8 HOURS AS NEEDED FOR NAUSEA OR VOMITING 12/07/22   Imogene Burn, MD  pilocarpine (PILOCAR) 1 % ophthalmic solution Place 1 drop into the right eye 2 (two) times daily. 08/31/22   [provider]  Plecanatide (TRULANCE) 3 MG TABS Take 1 tablet (3 mg total) by mouth daily. 09/15/22   Pyrtle, Carie Caddy, MD  polyethylene glycol (MIRALAX / GLYCOLAX) 17 g packet Take 17 g by mouth 2 (two) times daily. Patient taking differently: Take 68 g by mouth 2 (two) times daily. 07/13/22   Danford, Earl Lites, MD  polyvinyl alcohol (ARTIFICIAL TEARS) 1.4 % ophthalmic  solution Place 1 drop into both eyes as needed for dry eyes.    [provider]  Vitamin D, Ergocalciferol, (DRISDOL) 1.25 MG (50000 UNIT) CAPS capsule Take 50,000 Units by mouth every 7 (seven) days. 05/22/21   [provider]      Allergies    Cephalexin, Hydrocodone-acetaminophen, Ativan [lorazepam], Augmentin [amoxicillin-pot clavulanate], Avelox [moxifloxacin], Brimonidine, Chlorzoxazone, Ciprofloxacin hcl, Clavulanic acid, Crestor [rosuvastatin], Ezetimibe, Gluten meal, Lansoprazole, Nitrofuran derivatives, Phenergan [promethazine hcl], Wheat, Codeine, Darvon [propoxyphene hcl], and  Doxycycline    Review of Systems   Review of Systems  Neurological:  Positive for headaches.  All other systems reviewed and are negative.   Physical Exam Updated Vital Signs BP (!) 140/56 (BP Location: Left Arm)   Pulse 63   Temp 98 F (36.7 C)   Resp 20   Ht 5' (1.524 m)   Wt 59.4 kg   SpO2 97%   BMI 25.58 kg/m  Physical Exam Vitals and nursing note reviewed.  Constitutional:      Appearance: Normal appearance.  HENT:     Head: Normocephalic.     Comments: Small hematoma right parietal area    Right Ear: External ear normal.     Left Ear: External ear normal.     Nose: Nose normal.     Mouth/Throat:     Mouth: Mucous membranes are moist.     Pharynx: Oropharynx is clear.  Eyes:     Extraocular Movements: Extraocular movements intact.     Conjunctiva/sclera: Conjunctivae normal.     Pupils: Pupils are equal, round, and reactive to light.  Cardiovascular:     Rate and Rhythm: Normal rate and regular rhythm.     Pulses: Normal pulses.     Heart sounds: Normal heart sounds.  Pulmonary:     Effort: Pulmonary effort is normal.     Breath sounds: Normal breath sounds.  Abdominal:     General: Abdomen is flat. Bowel sounds are normal.     Palpations: Abdomen is soft.  Musculoskeletal:        General: Normal range of motion.     Cervical back: Normal range of motion. Muscular tenderness present.  Skin:    General: Skin is warm.     Capillary Refill: Capillary refill takes less than 2 seconds.  Neurological:     General: No focal deficit present.     Mental Status: She is alert and oriented to person, place, and time.  Psychiatric:        Mood and Affect: Mood normal.        Behavior: Behavior normal.     ED Results / Procedures / Treatments   Labs (all labs ordered are listed, but only abnormal results are displayed) Labs Reviewed - No data to display  EKG None  Radiology CT Head Wo Contrast  Result Date: 12/22/2022 CLINICAL DATA:  Head trauma,  moderate-severe Fall 2 weeks ago, headache.  Dizzy. EXAM: CT HEAD WITHOUT CONTRAST TECHNIQUE: Contiguous axial images were obtained from the base of the skull through the vertex without intravenous contrast. RADIATION DOSE REDUCTION: This exam was performed according to the departmental dose-optimization program which includes automated exposure control, adjustment of the mA and/or kV according to patient size and/or use of iterative reconstruction technique. COMPARISON:  Head CT 12/04/2022 FINDINGS: Brain: No intracranial hemorrhage, mass effect, or midline shift. Stable atrophy and chronic small vessel ischemia. The basilar cisterns are patent. No evidence of territorial infarct or acute ischemia. No extra-axial or intracranial  fluid collection. Vascular: Atherosclerosis of skullbase vasculature without hyperdense vessel or abnormal calcification. Skull: No fracture or focal lesion. Sinuses/Orbits: No acute finding. Other: Right parietal scalp hematoma, slightly diminished in the interim. IMPRESSION: 1. Right parietal scalp hematoma, slightly diminished in the interim over the past 2 weeks. 2. No acute intracranial abnormality.  No skull fracture. 3. Stable atrophy and chronic small vessel ischemia. Electronically Signed   By: Narda Rutherford M.D.   On: 12/22/2022 16:49    Procedures Procedures    Medications Ordered in ED Medications - No data to display  ED Course/ Medical Decision Making/ A&P                                 Medical Decision Making Amount and/or Complexity of Data Reviewed Radiology: ordered.   This patient presents to the ED for concern of fall, this involves an extensive number of treatment options, and is a complaint that carries with it a high risk of complications and morbidity.  The differential diagnosis includes multiple trauma   Co morbidities that complicate the patient evaluation  htn, gerd, afib (on Eliquis), chf, and cad   Additional history  obtained:  Additional history obtained from epic chart review External records from outside source obtained and reviewed including daughter   Imaging Studies ordered:  I ordered imaging studies including ct head  I independently visualized and interpreted imaging which showed   Right parietal scalp hematoma, slightly diminished in the interim  over the past 2 weeks.  2. No acute intracranial abnormality.  No skull fracture.  3. Stable atrophy and chronic small vessel ischemia.   I agree with the radiologist interpretation   Medicines ordered and prescription drug management:  I have reviewed the patients home medicines and have made adjustments as needed   Test Considered:  ct    Problem List / ED Course:  Cephalohematoma:  pt reassured that it will reabsorb.  Pt offered xrays of buttocks and hip which still hurt and ct c-spine, but she declined.  Pt said she just wanted to make sure she did not have any bleeding in her brain.  Pt is stable for d/c.  She is to return if worse.  F/u with pcp.   Reevaluation:  After the interventions noted above, I reevaluated the patient and found that they have :improved   Social Determinants of Health:  Lives at home   Dispostion:  After consideration of the diagnostic results and the patients response to treatment, I feel that the patent would benefit from discharge with outpatient f/u.          Final Clinical Impression(s) / ED Diagnoses Final diagnoses:  Traumatic hematoma of head, initial encounter  On apixaban therapy    Rx / DC Orders ED Discharge Orders     None         Jacalyn Lefevre, MD 12/22/22 1757

## 2022-12-22 NOTE — ED Triage Notes (Addendum)
Fell 2 weeks ago, her walker collasped and hit the back of her  saw  a dr then   in [place] has a bump ,still  is on eliquis has had a h/a, usually uses a walker she states , family left pt is poor historian  has been dizzy

## 2022-12-25 ENCOUNTER — Ambulatory Visit: Payer: Medicare Other | Admitting: Physical Therapy

## 2022-12-25 DIAGNOSIS — M25551 Pain in right hip: Secondary | ICD-10-CM

## 2022-12-25 NOTE — Therapy (Signed)
OUTPATIENT PHYSICAL THERAPY LOWER EXTREMITY    Patient Name: Amy Hickman MRN: [id number] DOB:07/30/1935, 87 y.o., female Today's Date: 12/25/2022  END OF SESSION:  PT End of Session - 12/25/22 0931     Visit Number 5    Date for PT Re-Evaluation 02/18/23    Authorization Type mcare    PT Start Time 0931    PT Stop Time 1015    PT Time Calculation (min) 44 min              Past Medical History:  Diagnosis Date   Atrial fibrillation, chronic (HCC)    s/p multiple cardioversions with sustained NSR, on Eliquis   Celiac disease    Chronic systolic CHF (congestive heart failure) (HCC)    Coronary artery disease involving native coronary artery of native heart without angina pectoris 03/15/2021   GERD (gastroesophageal reflux disease)    HOH (hard of hearing)    Hyperparathyroidism (HCC) 12/12/2020   Hypertension    Interstitial cystitis    Past Surgical History:  Procedure Laterality Date   ABDOMINAL HYSTERECTOMY     APPENDECTOMY     CARDIOVERSION N/A 04/24/2021   Procedure: CARDIOVERSION;  Surgeon: Jake Bathe, MD;  Location: [facility] ENDOSCOPY;  Service: Cardiovascular;  Laterality: N/A;   CARDIOVERSION N/A 06/12/2021   Procedure: CARDIOVERSION;  Surgeon: Jodelle Red, MD;  Location: [facility] ENDOSCOPY;  Service: Cardiovascular;  Laterality: N/A;   CARDIOVERSION N/A 08/11/2021   Procedure: CARDIOVERSION;  Surgeon: Meriam Sprague, MD;  Location: [facility] ENDOSCOPY;  Service: Cardiovascular;  Laterality: N/A;   CHOLECYSTECTOMY     ear drum surgery     REPLACEMENT TOTAL KNEE     ROTATOR CUFF REPAIR Right    TONSILLECTOMY     Patient Active Problem List   Diagnosis Date Noted   Abnormal CT scan, colon 07/18/2022   AKI (acute kidney injury) (HCC) 07/17/2022   Hyperkalemia 07/17/2022   COPD (chronic obstructive pulmonary disease) (HCC) 07/13/2022   Constipation 07/12/2022   Atrial fibrillation (HCC) 03/20/2022   Fracture of right ulna 03/18/2022   Atrial  fibrillation with rapid ventricular response (HCC) 03/17/2022   Elevated LFTs 02/05/2022   Malnutrition of moderate degree 12/24/2021   Chest pain 12/23/2021   Atrial fibrillation, chronic (HCC) 12/23/2021   CKD (chronic kidney disease) stage 4, GFR 15-29 ml/min (HCC) 12/23/2021   DNR (do not resuscitate) 12/23/2021   Celiac disease 12/23/2021   Interstitial cystitis 12/23/2021   Secondary hypercoagulable state (HCC) 08/11/2021   Atrial flutter (HCC)    NICM (nonischemic cardiomyopathy) (HCC) 05/30/2021   Venous insufficiency 05/30/2021   Persistent atrial fibrillation (HCC) 05/09/2021   Chronic systolic CHF (congestive heart failure) (HCC) 03/17/2021   Biatrial enlargement 03/17/2021   Sepsis (HCC) 03/16/2021   GERD (gastroesophageal reflux disease)    History of DVT (deep vein thrombosis)    Influenza A 03/15/2021   Anemia 03/15/2021   Atrial fibrillation with RVR (HCC) 03/15/2021   COPD with acute exacerbation (HCC) 03/15/2021   Dark stools 03/15/2021   Hyponatremia 03/15/2021   Hypothyroidism 03/15/2021   Coronary artery disease involving native coronary artery of native heart without angina pectoris 03/15/2021   Essential hypertension 03/15/2021   Hyperlipidemia 03/15/2021   Hyperparathyroidism (HCC) 12/12/2020   Depression 01/03/2019   History of pericarditis 08/06/2016   Generalized osteoarthritis of multiple sites 08/19/2015   Age-related osteoporosis without current pathological fracture 08/14/2015   DVT (deep venous thrombosis) (HCC) 08/14/2015   History of GI bleed 06/15/2014  [facility]  REFERRING PROVIDER: Rodolph Bong, MD  REFERRING DIAG: Right Hip Pain  THERAPY DIAG:  Pain in right hip  Rationale for Evaluation and Treatment: Rehabilitation  ONSET DATE: 11/10/22  SUBJECTIVE:   SUBJECTIVE STATEMENT: doing pretty well. No pain. Amb some in home without AD  PERTINENT HISTORY: Bilateral TKA and TSA PAIN:  Are you having pain?  Yes: NPRS scale: 0/10 Pain location: right hip area Pain description: sharp and ache Aggravating factors: standing, walking, raising the right hip and leg up, trying to straighten the leg out pain up to 9/10 Relieving factors: tylenol, rest pain is 0/10  PRECAUTIONS: None  RED FLAGS: None   WEIGHT BEARING RESTRICTIONS: No  FALLS:  Has patient fallen in last 6 months? No  LIVING ENVIRONMENT: Lives with: lives with their family Lives in: House/apartment Stairs:  has a stair lift Has following equipment at home: Environmental consultant - 4 wheeled and stair lift  OCCUPATION: retired  PLOF: Independent with household mobility with device and    PATIENT GOALS: have less pain, stand and move better, walk easier  NEXT MD VISIT: unsure  OBJECTIVE:   DIAGNOSTIC FINDINGS: has OA in the hip, SI areas  PATIENT SURVEYS:  FOTO 32  COGNITION: Overall cognitive status: Within functional limits for tasks assessed     SENSATION: WFL  POSTURE: rounded shoulders, forward head, and decreased lumbar lordosis  PALPATION: Very tender in the left hip area, into the buttock and the thigh area  LOWER EXTREMITY ROM:  Active ROM Right eval Left eval  Hip flexion 60   Hip extension 0   Hip abduction 12   Hip adduction    Hip internal rotation    Hip external rotation    Knee flexion    Knee extension    Ankle dorsiflexion    Ankle plantarflexion    Ankle inversion    Ankle eversion     (Blank rows = not tested)  LOWER EXTREMITY MMT: all right hip MMT was painful  MMT Right eval Left eval  Hip flexion 3+   Hip extension    Hip abduction 4-   Hip adduction 4-   Hip internal rotation 3   Hip external rotation 3   Knee flexion 4   Knee extension 4   Ankle dorsiflexion    Ankle plantarflexion    Ankle inversion    Ankle eversion     (Blank rows = not tested)  FUNCTIONAL TESTS:  Timed up and go (TUG): 40 s with rollator Berg Balance Scale: 29/56  GAIT: Distance walked:  100' Assistive device utilized: Environmental consultant - 4 wheeled Level of assistance: SBA Comments: very slow pattern, right toe in   TODAY'S TREATMENT:                                                                                                                              DATE:    12/25/22  Amb 75 feet with  spc, slow and unsteady- stated she felt less coordinated with cane. 3 brief stops to re balance Amb 75 feet without AD CGA smoother and less hesitation Nustep L 5 HS curl 20# 2 sets 10 LAQ 5# 2 sets 10 Resisted gait 20# 4 x 4 ways Green tband hip flex and clams    12/22/22  Nustep L 5 6 min TUG and assessed other goals Amb without AD 100 feet CGA , short steps and IR BIL- cautioned to be careful at home without AD as she is a bit unsteady and she states stays close to walls/furniture Standing with RW red tband 15x hip flex,ext and abd BIL Red tband LAQ 2 sets 10 Green tband HS curl 2 sets 10 STS on airex CG A 2 sets 5    11/30/22 NuStep L5x62mins  LAQ 2#  Standing marches 2# 20 reps alt x2  Standing hip abd 2x10  HS curls red 2x10   Hip abd seated green band 2x10  Bridges 2x10 Supine clamshells green 2x10 SLR x10  Feet on pball rotaitons, bridges, knees to chest STS 2x10   11/24/22 Nustep L 5 STS without UE 10 x Feet in ball bridge hold 3 sec 10 x, KTC, obl and isometric abdominals  Supine ADD ball squeeze, clams and hip flex 2 sets 10 red tband,tband trunk rotations 3# ankle wts HHA marching fwd and back 12 feet 2 x then laterally- min A needed with posterior lean 3# HHA 4 inch alt step taps- min A needed Ball toss and taps on airex - min A with LOB     PATIENT EDUCATION:  Education details: POC Person educated: Patient Education method: Explanation Education comprehension: verbalized understanding  HOME EXERCISE PROGRAM: Reviewed the HEP from home PT and MD  ASSESSMENT:  CLINICAL IMPRESSION: focus session on gait,strength and stamina and she  tolerated well. Rec RW outside home at all times and okay without at home in [place] and bathroom as she uses counters. OBJECTIVE IMPAIRMENTS: Abnormal gait, cardiopulmonary status limiting activity, decreased activity tolerance, decreased balance, decreased endurance, decreased mobility, difficulty walking, decreased ROM, decreased strength, increased muscle spasms, impaired flexibility, improper body mechanics, and pain.   REHAB POTENTIAL: Good  CLINICAL DECISION MAKING: Stable/uncomplicated  EVALUATION COMPLEXITY: Low   GOALS: Goals reviewed with patient? Yes  SHORT TERM GOALS: Target date: 11/30/22 Independent with initial HEP Goal status: INITIAL  LONG TERM GOALS: Target date: 02/18/23  Independent with advanced HEP Goal status: INITIAL  2.  Decrease TUG time to 22 seconds Goal status: 21.44 sec MET  3.  Increase BERG to 40/56 Goal status: INITIAL  4.  Increase right hip strength to 4-/5 Goal status: 12/22/22 progressing  5.  Walk 700 feet without pain in the hip >5/10 Goal status: 12/22/22 progressing  6.  Decrease overall pain level 50% Goal status: 12/22/22 40-50% worse at night   PLAN:  PT FREQUENCY: 1-2x/week  PT DURATION: 12 weeks  PLANNED INTERVENTIONS: Therapeutic exercises, Therapeutic activity, Neuromuscular re-education, Balance training, Gait training, Patient/Family education, Self Care, Joint mobilization, Joint manipulation, Stair training, Dry Needling, Electrical stimulation, Cryotherapy, Moist heat, Taping, Ultrasound, Ionotophoresis 4mg /ml Dexamethasone, and Manual therapy  PLAN FOR NEXT SESSION: progress exercises for strength, function and balance   Thurma Priego,ANGIE, PTA 12/25/2022, 9:32 AM [place] [facility] at [facility] [address] [facility] [place], [place], [address] Phone: [phone]   Fax:  [phone]  Patient Details  Name: NARGES DILG MRN: [id number] Date of Birth: February 16, 1936  Referring Provider:   Delfin Gant, MD  Encounter Date: 12/25/2022   [facility] at [facility] [address] [place], [place], [address] Phone: [phone]   Fax:  [phone]

## 2022-12-28 NOTE — Therapy (Signed)
OUTPATIENT PHYSICAL THERAPY LOWER EXTREMITY    Patient Name: Amy Hickman MRN: [id number] DOB:07-03-1935, 87 y.o., female Today's Date: 12/28/2022  END OF SESSION:     Past Medical History:  Diagnosis Date   Atrial fibrillation, chronic (HCC)    s/p multiple cardioversions with sustained NSR, on Eliquis   Celiac disease    Chronic systolic CHF (congestive heart failure) (HCC)    Coronary artery disease involving native coronary artery of native heart without angina pectoris 03/15/2021   GERD (gastroesophageal reflux disease)    HOH (hard of hearing)    Hyperparathyroidism (HCC) 12/12/2020   Hypertension    Interstitial cystitis    Past Surgical History:  Procedure Laterality Date   ABDOMINAL HYSTERECTOMY     APPENDECTOMY     CARDIOVERSION N/A 04/24/2021   Procedure: CARDIOVERSION;  Surgeon: Jake Bathe, MD;  Location: [facility] ENDOSCOPY;  Service: Cardiovascular;  Laterality: N/A;   CARDIOVERSION N/A 06/12/2021   Procedure: CARDIOVERSION;  Surgeon: Jodelle Red, MD;  Location: [facility] ENDOSCOPY;  Service: Cardiovascular;  Laterality: N/A;   CARDIOVERSION N/A 08/11/2021   Procedure: CARDIOVERSION;  Surgeon: Meriam Sprague, MD;  Location: [facility] ENDOSCOPY;  Service: Cardiovascular;  Laterality: N/A;   CHOLECYSTECTOMY     ear drum surgery     REPLACEMENT TOTAL KNEE     ROTATOR CUFF REPAIR Right    TONSILLECTOMY     Patient Active Problem List   Diagnosis Date Noted   Abnormal CT scan, colon 07/18/2022   AKI (acute kidney injury) (HCC) 07/17/2022   Hyperkalemia 07/17/2022   COPD (chronic obstructive pulmonary disease) (HCC) 07/13/2022   Constipation 07/12/2022   Atrial fibrillation (HCC) 03/20/2022   Fracture of right ulna 03/18/2022   Atrial fibrillation with rapid ventricular response (HCC) 03/17/2022   Elevated LFTs 02/05/2022   Malnutrition of moderate degree 12/24/2021   Chest pain 12/23/2021   Atrial fibrillation, chronic (HCC) 12/23/2021   CKD (chronic  kidney disease) stage 4, GFR 15-29 ml/min (HCC) 12/23/2021   DNR (do not resuscitate) 12/23/2021   Celiac disease 12/23/2021   Interstitial cystitis 12/23/2021   Secondary hypercoagulable state (HCC) 08/11/2021   Atrial flutter (HCC)    NICM (nonischemic cardiomyopathy) (HCC) 05/30/2021   Venous insufficiency 05/30/2021   Persistent atrial fibrillation (HCC) 05/09/2021   Chronic systolic CHF (congestive heart failure) (HCC) 03/17/2021   Biatrial enlargement 03/17/2021   Sepsis (HCC) 03/16/2021   GERD (gastroesophageal reflux disease)    History of DVT (deep vein thrombosis)    Influenza A 03/15/2021   Anemia 03/15/2021   Atrial fibrillation with RVR (HCC) 03/15/2021   COPD with acute exacerbation (HCC) 03/15/2021   Dark stools 03/15/2021   Hyponatremia 03/15/2021   Hypothyroidism 03/15/2021   Coronary artery disease involving native coronary artery of native heart without angina pectoris 03/15/2021   Essential hypertension 03/15/2021   Hyperlipidemia 03/15/2021   Hyperparathyroidism (HCC) 12/12/2020   Depression 01/03/2019   History of pericarditis 08/06/2016   Generalized osteoarthritis of multiple sites 08/19/2015   Age-related osteoporosis without current pathological fracture 08/14/2015   DVT (deep venous thrombosis) (HCC) 08/14/2015   History of GI bleed 06/15/2014    [facility]  REFERRING PROVIDER: Rodolph Bong, MD  REFERRING DIAG: Right Hip Pain  THERAPY DIAG:  No diagnosis found.  Rationale for Evaluation and Treatment: Rehabilitation  ONSET DATE: 11/10/22  SUBJECTIVE:   SUBJECTIVE STATEMENT: doing pretty well. No pain. Amb some in home without AD  PERTINENT HISTORY: Bilateral TKA and TSA PAIN:  Are  you having pain? Yes: NPRS scale: 0/10 Pain location: right hip area Pain description: sharp and ache Aggravating factors: standing, walking, raising the right hip and leg up, trying to straighten the leg out pain up to 9/10 Relieving  factors: tylenol, rest pain is 0/10  PRECAUTIONS: None  RED FLAGS: None   WEIGHT BEARING RESTRICTIONS: No  FALLS:  Has patient fallen in last 6 months? No  LIVING ENVIRONMENT: Lives with: lives with their family Lives in: House/apartment Stairs:  has a stair lift Has following equipment at home: Environmental consultant - 4 wheeled and stair lift  OCCUPATION: retired  PLOF: Independent with household mobility with device and    PATIENT GOALS: have less pain, stand and move better, walk easier  NEXT MD VISIT: unsure  OBJECTIVE:   DIAGNOSTIC FINDINGS: has OA in the hip, SI areas  PATIENT SURVEYS:  FOTO 32  COGNITION: Overall cognitive status: Within functional limits for tasks assessed     SENSATION: WFL  POSTURE: rounded shoulders, forward head, and decreased lumbar lordosis  PALPATION: Very tender in the left hip area, into the buttock and the thigh area  LOWER EXTREMITY ROM:  Active ROM Right eval Left eval  Hip flexion 60   Hip extension 0   Hip abduction 12   Hip adduction    Hip internal rotation    Hip external rotation    Knee flexion    Knee extension    Ankle dorsiflexion    Ankle plantarflexion    Ankle inversion    Ankle eversion     (Blank rows = not tested)  LOWER EXTREMITY MMT: all right hip MMT was painful  MMT Right eval Left eval  Hip flexion 3+   Hip extension    Hip abduction 4-   Hip adduction 4-   Hip internal rotation 3   Hip external rotation 3   Knee flexion 4   Knee extension 4   Ankle dorsiflexion    Ankle plantarflexion    Ankle inversion    Ankle eversion     (Blank rows = not tested)  FUNCTIONAL TESTS:  Timed up and go (TUG): 40 s with rollator Berg Balance Scale: 29/56  GAIT: Distance walked: 100' Assistive device utilized: Environmental consultant - 4 wheeled Level of assistance: SBA Comments: very slow pattern, right toe in   TODAY'S TREATMENT:                                                                                                                               DATE:   12/29/22 NuStep Leg ext HS curls STS  Side steps over obstacles Walking laps around gym w/o AD  greenTB abduction    12/25/22 Amb 75 feet with spc, slow and unsteady- stated she felt less coordinated with cane. 3 brief stops to re balance Amb 75 feet without AD CGA smoother and less hesitation Nustep L 5 HS curl 20# 2 sets 10 LAQ 5# 2  sets 10 Resisted gait 20# 4 x 4 ways Green tband hip flex and clams    12/22/22  Nustep L 5 6 min TUG and assessed other goals Amb without AD 100 feet CGA , short steps and IR BIL- cautioned to be careful at home without AD as she is a bit unsteady and she states stays close to walls/furniture Standing with RW red tband 15x hip flex,ext and abd BIL Red tband LAQ 2 sets 10 Green tband HS curl 2 sets 10 STS on airex CG A 2 sets 5    11/30/22 NuStep L5x74mins  LAQ 2#  Standing marches 2# 20 reps alt x2  Standing hip abd 2x10  HS curls red 2x10   Hip abd seated green band 2x10  Bridges 2x10 Supine clamshells green 2x10 SLR x10  Feet on pball rotaitons, bridges, knees to chest STS 2x10   11/24/22 Nustep L 5 STS without UE 10 x Feet in ball bridge hold 3 sec 10 x, KTC, obl and isometric abdominals  Supine ADD ball squeeze, clams and hip flex 2 sets 10 red tband,tband trunk rotations 3# ankle wts HHA marching fwd and back 12 feet 2 x then laterally- min A needed with posterior lean 3# HHA 4 inch alt step taps- min A needed Ball toss and taps on airex - min A with LOB     PATIENT EDUCATION:  Education details: POC Person educated: Patient Education method: Explanation Education comprehension: verbalized understanding  HOME EXERCISE PROGRAM: Reviewed the HEP from home PT and MD  ASSESSMENT:  CLINICAL IMPRESSION: focus session on gait,strength and stamina and she tolerated well. Rec RW outside home at all times and okay without at home in [place] and bathroom as she uses  counters. OBJECTIVE IMPAIRMENTS: Abnormal gait, cardiopulmonary status limiting activity, decreased activity tolerance, decreased balance, decreased endurance, decreased mobility, difficulty walking, decreased ROM, decreased strength, increased muscle spasms, impaired flexibility, improper body mechanics, and pain.   REHAB POTENTIAL: Good  CLINICAL DECISION MAKING: Stable/uncomplicated  EVALUATION COMPLEXITY: Low   GOALS: Goals reviewed with patient? Yes  SHORT TERM GOALS: Target date: 11/30/22 Independent with initial HEP Goal status: INITIAL  LONG TERM GOALS: Target date: 02/18/23  Independent with advanced HEP Goal status: INITIAL  2.  Decrease TUG time to 22 seconds Goal status: 21.44 sec MET  3.  Increase BERG to 40/56 Goal status: INITIAL  4.  Increase right hip strength to 4-/5 Goal status: 12/22/22 progressing  5.  Walk 700 feet without pain in the hip >5/10 Goal status: 12/22/22 progressing  6.  Decrease overall pain level 50% Goal status: 12/22/22 40-50% worse at night   PLAN:  PT FREQUENCY: 1-2x/week  PT DURATION: 12 weeks  PLANNED INTERVENTIONS: Therapeutic exercises, Therapeutic activity, Neuromuscular re-education, Balance training, Gait training, Patient/Family education, Self Care, Joint mobilization, Joint manipulation, Stair training, Dry Needling, Electrical stimulation, Cryotherapy, Moist heat, Taping, Ultrasound, Ionotophoresis 4mg /ml Dexamethasone, and Manual therapy  PLAN FOR NEXT SESSION: progress exercises for strength, function and balance   Cassie Freer, PT 12/28/2022, 8:18 AM [facility] at [facility] [address] [facility] [place], [place], [address] Phone: [phone]   Fax:  [phone]  Patient Details  Name: Amy Hickman MRN: [id number] Date of Birth: Jul 30, 1935 Referring Provider:  Delfin Gant, MD  Encounter Date: 12/29/2022   [facility] [place] Out[facility] Rehabilitation at [facility] [address] [facility] [place], [place], [address] Phone: [phone]   Fax:  [phone]

## 2022-12-29 ENCOUNTER — Ambulatory Visit: Payer: Medicare Other

## 2022-12-29 DIAGNOSIS — M25551 Pain in right hip: Secondary | ICD-10-CM

## 2022-12-30 NOTE — Therapy (Signed)
OUTPATIENT PHYSICAL THERAPY LOWER EXTREMITY    Patient Name: Amy Hickman MRN: [id number] DOB:August 01, 1935, 87 y.o., female Today's Date: 12/31/2022  END OF SESSION:  PT End of Session - 12/31/22 1010     Visit Number 7    Date for PT Re-Evaluation 02/18/23    Authorization Type mcare    PT Start Time 1010    PT Stop Time 1055    PT Time Calculation (min) 45 min    Activity Tolerance Patient tolerated treatment well    Behavior During Therapy WFL for tasks assessed/performed                Past Medical History:  Diagnosis Date   Atrial fibrillation, chronic (HCC)    s/p multiple cardioversions with sustained NSR, on Eliquis   Celiac disease    Chronic systolic CHF (congestive heart failure) (HCC)    Coronary artery disease involving native coronary artery of native heart without angina pectoris 03/15/2021   GERD (gastroesophageal reflux disease)    HOH (hard of hearing)    Hyperparathyroidism (HCC) 12/12/2020   Hypertension    Interstitial cystitis    Past Surgical History:  Procedure Laterality Date   ABDOMINAL HYSTERECTOMY     APPENDECTOMY     CARDIOVERSION N/A 04/24/2021   Procedure: CARDIOVERSION;  Surgeon: Jake Bathe, MD;  Location: [facility] ENDOSCOPY;  Service: Cardiovascular;  Laterality: N/A;   CARDIOVERSION N/A 06/12/2021   Procedure: CARDIOVERSION;  Surgeon: Jodelle Red, MD;  Location: [facility] ENDOSCOPY;  Service: Cardiovascular;  Laterality: N/A;   CARDIOVERSION N/A 08/11/2021   Procedure: CARDIOVERSION;  Surgeon: Meriam Sprague, MD;  Location: [facility] ENDOSCOPY;  Service: Cardiovascular;  Laterality: N/A;   CHOLECYSTECTOMY     ear drum surgery     REPLACEMENT TOTAL KNEE     ROTATOR CUFF REPAIR Right    TONSILLECTOMY     Patient Active Problem List   Diagnosis Date Noted   Abnormal CT scan, colon 07/18/2022   AKI (acute kidney injury) (HCC) 07/17/2022   Hyperkalemia 07/17/2022   COPD (chronic obstructive pulmonary disease) (HCC) 07/13/2022    Constipation 07/12/2022   Atrial fibrillation (HCC) 03/20/2022   Fracture of right ulna 03/18/2022   Atrial fibrillation with rapid ventricular response (HCC) 03/17/2022   Elevated LFTs 02/05/2022   Malnutrition of moderate degree 12/24/2021   Chest pain 12/23/2021   Atrial fibrillation, chronic (HCC) 12/23/2021   CKD (chronic kidney disease) stage 4, GFR 15-29 ml/min (HCC) 12/23/2021   DNR (do not resuscitate) 12/23/2021   Celiac disease 12/23/2021   Interstitial cystitis 12/23/2021   Secondary hypercoagulable state (HCC) 08/11/2021   Atrial flutter (HCC)    NICM (nonischemic cardiomyopathy) (HCC) 05/30/2021   Venous insufficiency 05/30/2021   Persistent atrial fibrillation (HCC) 05/09/2021   Chronic systolic CHF (congestive heart failure) (HCC) 03/17/2021   Biatrial enlargement 03/17/2021   Sepsis (HCC) 03/16/2021   GERD (gastroesophageal reflux disease)    History of DVT (deep vein thrombosis)    Influenza A 03/15/2021   Anemia 03/15/2021   Atrial fibrillation with RVR (HCC) 03/15/2021   COPD with acute exacerbation (HCC) 03/15/2021   Dark stools 03/15/2021   Hyponatremia 03/15/2021   Hypothyroidism 03/15/2021   Coronary artery disease involving native coronary artery of native heart without angina pectoris 03/15/2021   Essential hypertension 03/15/2021   Hyperlipidemia 03/15/2021   Hyperparathyroidism (HCC) 12/12/2020   Depression 01/03/2019   History of pericarditis 08/06/2016   Generalized osteoarthritis of multiple sites 08/19/2015   Age-related osteoporosis without current  pathological fracture 08/14/2015   DVT (deep venous thrombosis) (HCC) 08/14/2015   History of GI bleed 06/15/2014    [facility]  REFERRING PROVIDER: Rodolph Bong, MD  REFERRING DIAG: Right Hip Pain  THERAPY DIAG:  Pain in right hip  Rationale for Evaluation and Treatment: Rehabilitation  ONSET DATE: 11/10/22  SUBJECTIVE:   SUBJECTIVE STATEMENT: doctor told me we  had to take it slow because something is wrong with my pelvic bone. Having some discomfort in the R hip. Doing more walking inside the house.   PERTINENT HISTORY: Bilateral TKA and TSA PAIN:  Are you having pain? Yes: NPRS scale: 0/10 Pain location: right hip area Pain description: sharp and ache Aggravating factors: standing, walking, raising the right hip and leg up, trying to straighten the leg out pain up to 9/10 Relieving factors: tylenol, rest pain is 0/10  PRECAUTIONS: None  RED FLAGS: None   WEIGHT BEARING RESTRICTIONS: No  FALLS:  Has patient fallen in last 6 months? No  LIVING ENVIRONMENT: Lives with: lives with their family Lives in: House/apartment Stairs:  has a stair lift Has following equipment at home: Environmental consultant - 4 wheeled and stair lift  OCCUPATION: retired  PLOF: Independent with household mobility with device and    PATIENT GOALS: have less pain, stand and move better, walk easier  NEXT MD VISIT: unsure  OBJECTIVE:   DIAGNOSTIC FINDINGS: has OA in the hip, SI areas  PATIENT SURVEYS:  FOTO 32  COGNITION: Overall cognitive status: Within functional limits for tasks assessed     SENSATION: WFL  POSTURE: rounded shoulders, forward head, and decreased lumbar lordosis  PALPATION: Very tender in the left hip area, into the buttock and the thigh area  LOWER EXTREMITY ROM:  Active ROM Right eval Left eval  Hip flexion 60   Hip extension 0   Hip abduction 12   Hip adduction    Hip internal rotation    Hip external rotation    Knee flexion    Knee extension    Ankle dorsiflexion    Ankle plantarflexion    Ankle inversion    Ankle eversion     (Blank rows = not tested)  LOWER EXTREMITY MMT: all right hip MMT was painful  MMT Right eval Left eval  Hip flexion 3+   Hip extension    Hip abduction 4-   Hip adduction 4-   Hip internal rotation 3   Hip external rotation 3   Knee flexion 4   Knee extension 4   Ankle dorsiflexion     Ankle plantarflexion    Ankle inversion    Ankle eversion     (Blank rows = not tested)  FUNCTIONAL TESTS:  Timed up and go (TUG): 40 s with rollator Berg Balance Scale: 29/56  GAIT: Distance walked: 100' Assistive device utilized: Environmental consultant - 4 wheeled Level of assistance: SBA Comments: very slow pattern, right toe in   TODAY'S TREATMENT:  DATE:   12/31/22 In bars stepping over beams forwards and side steps  2# marching in bars 2# hip abd/ext  Step up on airex  STS with chest press red ball 2x10    12/29/22 NuStep L5x56mins  Box taps 4"- minA  Stair practice/step ups 4" STS 2x10 Side steps over obstacles 2HHA Leg ext 5# 2x10 HS curls 20# 2x10 greenTB abduction 2x10 Walking 1 big lap around gym no AD- CGA    12/25/22 Amb 75 feet with spc, slow and unsteady- stated she felt less coordinated with cane. 3 brief stops to re balance Amb 75 feet without AD CGA smoother and less hesitation Nustep L 5 HS curl 20# 2 sets 10 LAQ 5# 2 sets 10 Resisted gait 20# 4 x 4 ways Green tband hip flex and clams   12/22/22  Nustep L 5 6 min TUG and assessed other goals Amb without AD 100 feet CGA , short steps and IR BIL- cautioned to be careful at home without AD as she is a bit unsteady and she states stays close to walls/furniture Standing with RW red tband 15x hip flex,ext and abd BIL Red tband LAQ 2 sets 10 Green tband HS curl 2 sets 10 STS on airex CG A 2 sets 5    11/30/22 NuStep L5x35mins  LAQ 2#  Standing marches 2# 20 reps alt x2  Standing hip abd 2x10  HS curls red 2x10   Hip abd seated green band 2x10  Bridges 2x10 Supine clamshells green 2x10 SLR x10  Feet on pball rotaitons, bridges, knees to chest STS 2x10   11/24/22 Nustep L 5 STS without UE 10 x Feet in ball bridge hold 3 sec 10 x, KTC, obl and isometric abdominals  Supine  ADD ball squeeze, clams and hip flex 2 sets 10 red tband,tband trunk rotations 3# ankle wts HHA marching fwd and back 12 feet 2 x then laterally- min A needed with posterior lean 3# HHA 4 inch alt step taps- min A needed Ball toss and taps on airex - min A with LOB     PATIENT EDUCATION:  Education details: POC Person educated: Patient Education method: Explanation Education comprehension: verbalized understanding  HOME EXERCISE PROGRAM: Reviewed the HEP from home PT and MD  ASSESSMENT:  CLINICAL IMPRESSION: session continued to focus on on gait, strength and stamina and she tolerated well. Does have some fatigue with interventions. 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 Difficulty with step up on airex, her feet kept catching on the pad. Was able to correct some after cueing.   OBJECTIVE IMPAIRMENTS: Abnormal gait, cardiopulmonary status limiting activity, decreased activity tolerance, decreased balance, decreased endurance, decreased mobility, difficulty walking, decreased ROM, decreased strength, increased muscle spasms, impaired flexibility, improper body mechanics, and pain.   REHAB POTENTIAL: Good  CLINICAL DECISION MAKING: Stable/uncomplicated  EVALUATION COMPLEXITY: Low   GOALS: Goals reviewed with patient? Yes  SHORT TERM GOALS: Target date: 11/30/22 Independent with initial HEP Goal status: INITIAL  LONG TERM GOALS: Target date: 02/18/23  Independent with advanced HEP Goal status: INITIAL  2.  Decrease TUG time to 22 seconds Goal status: 21.44 sec MET  3.  Increase BERG to 40/56 Goal status: INITIAL  4.  Increase right hip strength to 4-/5 Goal status: 12/22/22 progressing  5.  Walk 700 feet without pain in the hip >5/10 Goal status: 12/22/22 progressing  6.  Decrease overall pain level 50% Goal  status: 12/22/22 40-50% worse at night   PLAN:  PT FREQUENCY: 1-2x/week  PT DURATION: 12  weeks  PLANNED INTERVENTIONS: Therapeutic exercises, Therapeutic activity, Neuromuscular re-education, Balance training, Gait training, Patient/Family education, Self Care, Joint mobilization, Joint manipulation, Stair training, Dry Needling, Electrical stimulation, Cryotherapy, Moist heat, Taping, Ultrasound, Ionotophoresis 4mg /ml Dexamethasone, and Manual therapy  PLAN FOR NEXT SESSION: progress exercises for strength, function and balance   Cassie Freer, PT 12/31/2022, 10:54 AM [facility] Out[facility] Rehabilitation at [facility] [address] [facility]. [place], [place], [address] Phone: [phone]   Fax:  [phone]  Patient Details  Name: Amy Hickman MRN: [id number] Date of Birth: Nov 11, 1935 Referring Provider:  Delfin Gant, MD  Encounter Date: 12/31/2022   [facility] [place] Outpatient Rehabilitation at [facility] [address] [place], [place], [address] Phone: [phone]   Fax:  [phone]

## 2022-12-31 ENCOUNTER — Ambulatory Visit: Payer: Medicare Other

## 2022-12-31 DIAGNOSIS — M25551 Pain in right hip: Secondary | ICD-10-CM

## 2023-01-12 ENCOUNTER — Ambulatory Visit: Payer: Medicare Other

## 2023-01-13 ENCOUNTER — Other Ambulatory Visit: Payer: Medicare Other

## 2023-01-13 ENCOUNTER — Ambulatory Visit (INDEPENDENT_AMBULATORY_CARE_PROVIDER_SITE_OTHER): Payer: Medicare Other | Admitting: Internal Medicine

## 2023-01-13 ENCOUNTER — Encounter: Payer: Self-pay | Admitting: Internal Medicine

## 2023-01-13 ENCOUNTER — Telehealth: Payer: Self-pay

## 2023-01-13 VITALS — BP 168/78 | HR 70 | Ht 60.0 in | Wt 137.2 lb

## 2023-01-13 DIAGNOSIS — R1084 Generalized abdominal pain: Secondary | ICD-10-CM

## 2023-01-13 DIAGNOSIS — K5900 Constipation, unspecified: Secondary | ICD-10-CM

## 2023-01-13 DIAGNOSIS — R140 Abdominal distension (gaseous): Secondary | ICD-10-CM

## 2023-01-13 DIAGNOSIS — K900 Celiac disease: Secondary | ICD-10-CM

## 2023-01-13 MED ORDER — MOTEGRITY 2 MG PO TABS: 1.0000 | ORAL_TABLET | Freq: Every day | ORAL | 0 refills | Status: DC

## 2023-01-13 NOTE — Telephone Encounter (Signed)
Left message for patient offering her an appt with Mike Gip, PA on 03/03/23 at 1:30pm.

## 2023-01-13 NOTE — Telephone Encounter (Signed)
Message Received: Today Pyrtle, Carie Caddy, MD  Illene Bolus, CMA Just use one of the 7 day holds please JMP       Previous Messages    ----- Message ----- From: Illene Bolus, CMA Sent: 01/13/2023  12:06 PM EDT To: Beverley Fiedler, MD  She does not have anything available in 6-8 weeks. They are all 7 day holds. ----- Message ----- From: Beverley Fiedler, MD Sent: 01/13/2023  11:09 AM EDT To: Illene Bolus, CMA  See if AE can see her in 6-8 weeks please JMP

## 2023-01-13 NOTE — Patient Instructions (Addendum)
Your provider has requested that you go to the basement level for lab work before leaving today. Press "B" on the elevator. The lab is located at the first door on the left as you exit the elevator.  Start Motegrity samples once daily in place of the [facility].   MyChart message our office in 10 days to let us know if the Motegrity is helping.  You can continue Miralax twice daily and can use Senna as needed.   You have been scheduled for a CT scan of the abdomen and pelvis at [facility], 1st floor Radiology. You are scheduled on 01/20/23 at 3:00pm. You should arrive at 12:45 to drink contrast starting at 1:00pm.  1) Do not eat anything after 11:00am (4 hours prior to your test)     You may take any medications as prescribed with a small amount of water, if necessary. If you take any of the following medications: METFORMIN, GLUCOPHAGE, GLUCOVANCE, AVANDAMET, RIOMET, FORTAMET, ACTOPLUS MET, JANUMET, GLUMETZA or METAGLIP, you MAY be asked to HOLD this medication 48 hours AFTER the exam.   The purpose of you drinking the oral contrast is to aid in the visualization of your intestinal tract. The contrast solution may cause some diarrhea. Depending on your individual set of symptoms, you may also receive an intravenous injection of x-ray contrast/dye. Plan on being at [facility] for 45 minutes or longer, depending on the type of exam you are having performed.   If you have any questions regarding your exam or if you need to reschedule, you may call [facility] at [phone] between the hours of 8:00 am and 5:00 pm, Monday-Friday.    _______________________________________________________  If your blood pressure at your visit was 140/90 or greater, please contact your primary care physician to follow up on this.  _______________________________________________________  If you are age 27 or older, your body mass index should be between 23-30. Your Body mass index is 26.8  kg/m. If this is out of the aforementioned range listed, please consider follow up with your Primary Care Provider.  If you are age 82 or younger, your body mass index should be between 19-25. Your Body mass index is 26.8 kg/m. If this is out of the aformentioned range listed, please consider follow up with your Primary Care Provider.   ________________________________________________________  The [facility] providers would like to encourage you to use [facility] to communicate with providers for non-urgent requests or questions.  Due to long hold times on the telephone, sending your provider a message by [facility] may be a faster and more efficient way to get a response.  Please allow 48 business hours for a response.  Please remember that this is for non-urgent requests.  _______________________________________________________

## 2023-01-13 NOTE — Progress Notes (Signed)
Subjective:    Patient ID: Amy Hickman, female    DOB: [birth year], 87 y.o.   MRN: [id number]  HPI Amy Hickman is an 87 year old female with a history of celiac disease, around a year of constipation and obstipation, prior abdominal surgeries including repair of intussusception as a child, appendectomy, hysterectomy, lysis of adhesions, history of CHF, CAD, A-fib on Eliquis, hyperparathyroidism, hypertension and ICD who is here for follow-up.  She is here today with her daughter.  She was last here on 08/11/2022.  She continues to deal with abdominal bloating, fullness and constipation.  Linzess helped but only for short time and she has been using Trulance daily since her last visit.  This is better than Linzess but not completely effective.  She still using MiraLAX 1 or 2 times a day.  She is occasionally using senna 1 or 2 tablets as needed.  She has small stools usually daily but not often complete or satisfying.  When she is most constipated her appetite is decreased and she has belching.  She denies vomiting.  No blood in stool or melena.   Review of Systems As per HPI, otherwise negative  Current Medications, Allergies, Past Medical History, Past Surgical History, Family History and Social History were reviewed in [employer] record.    Objective:   Physical Exam BP (!) 168/78   Pulse 70   Ht 5' (1.524 m)   Wt 137 lb 3.2 oz (62.2 kg)   BMI 26.80 kg/m  Gen: awake, alert, NAD HEENT: anicteric  CV: RRR, no mrg Pulm: CTA b/l Abd: soft, distended significantly but nontender, +BS throughout Ext: no c/c/e Neuro: nonfocal     Latest Ref Rng & Units 07/18/2022    2:25 AM 07/17/2022    9:37 PM 07/17/2022    9:38 AM  CBC  WBC 4.0 - 10.5 K/uL 7.6  7.5  7.0   Hemoglobin 12.0 - 15.0 g/dL 86.5  78.4  69.6   Hematocrit 36.0 - 46.0 % 36.3  34.8  35.8   Platelets 150 - 400 K/uL 188  180  215    CMP     Component Value Date/Time   NA 134 (L) 08/11/2022 1426    NA 140 05/30/2021 1526   K 4.7 08/11/2022 1426   CL 100 08/11/2022 1426   CO2 28 08/11/2022 1426   GLUCOSE 98 08/11/2022 1426   BUN 19 08/11/2022 1426   BUN 34 (H) 05/30/2021 1526   CREATININE 1.49 (H) 08/11/2022 1426   CALCIUM 9.8 08/11/2022 1426   PROT 6.5 07/17/2022 0938   ALBUMIN 3.8 07/17/2022 0938   AST 28 07/17/2022 0938   ALT 33 07/17/2022 0938   ALKPHOS 49 07/17/2022 0938   BILITOT 0.3 07/17/2022 0938   GFR 32.09 (L) 08/11/2022 1426   EGFR 33 (L) 05/30/2021 1526   GFRNONAA 50 (L) 07/19/2022 0623         Assessment & Plan:  87 year old female with a history of celiac disease, around a year of constipation and obstipation, prior abdominal surgeries including repair of intussusception as a child, appendectomy, hysterectomy, lysis of adhesions, history of CHF, CAD, A-fib on Eliquis, hyperparathyroidism, hypertension and ICD who is here for follow-up.    Ongoing constipation/obstipation --history of abdominal adhesive disease, celiac disease.  Her last colonoscopy was 3 years ago and did not show any significant pathology.  No obstructive process seen by cross-sectional imaging 6 months ago.  Linzess, Amitiza and now Trulance incompletely effective. -- Repeat  CT scan with oral contrast without IV contrast -- Motegrity samples 2 mg daily x 21 days to see if this is more effective than Trulance -- Stop Trulance for now -- Can continue MiraLAX twice daily and if needed use senna 1 or 2 tablets at bedtime  2.  Celiac disease --check TTG.  Rarely I have seen celiac patients that become constipated when they are exposed to gluten.  40 minutes total spent today including patient facing time, coordination of care, reviewing medical history/procedures/pertinent radiology studies, and documentation of the encounter.

## 2023-01-14 ENCOUNTER — Ambulatory Visit: Payer: Medicare Other | Admitting: Physical Therapy

## 2023-01-14 LAB — TISSUE TRANSGLUTAMINASE, IGA: (tTG) Ab, IgA: <1 U/mL

## 2023-01-14 NOTE — Telephone Encounter (Signed)
Left message for patient to return my call.

## 2023-01-15 NOTE — Telephone Encounter (Signed)
Left message for patient to return my call.

## 2023-01-18 NOTE — Telephone Encounter (Signed)
Left message for patient to return my call.

## 2023-01-18 NOTE — Telephone Encounter (Signed)
Left multiple messages for patient, will await phone call back.

## 2023-01-20 ENCOUNTER — Ambulatory Visit (HOSPITAL_COMMUNITY)
Admission: RE | Admit: 2023-01-20 | Discharge: 2023-01-20 | Disposition: A | Discharge status: Home / Self Care | Payer: Medicare Other | Source: Ambulatory Visit | Attending: Internal Medicine | Admitting: Internal Medicine

## 2023-01-20 DIAGNOSIS — K5900 Constipation, unspecified: Secondary | ICD-10-CM | POA: Insufficient documentation

## 2023-01-20 DIAGNOSIS — R140 Abdominal distension (gaseous): Secondary | ICD-10-CM | POA: Insufficient documentation

## 2023-01-20 DIAGNOSIS — R1084 Generalized abdominal pain: Secondary | ICD-10-CM | POA: Insufficient documentation

## 2023-01-20 MED ORDER — IOHEXOL 9 MG/ML PO SOLN
ORAL | Status: AC
  Filled 2023-01-20: qty 1000

## 2023-01-20 MED ORDER — IOHEXOL 9 MG/ML PO SOLN
1000.0000 mL | Freq: Once | ORAL | Status: AC
  Administered 2023-01-20: 1000 mL via ORAL

## 2023-01-21 ENCOUNTER — Ambulatory Visit: Payer: Medicare Other | Admitting: Physical Therapy

## 2023-01-22 ENCOUNTER — Ambulatory Visit: Payer: Medicare Other | Admitting: Physical Therapy

## 2023-01-24 ENCOUNTER — Encounter: Payer: Self-pay | Admitting: Internal Medicine

## 2023-01-25 ENCOUNTER — Other Ambulatory Visit: Payer: Self-pay

## 2023-01-25 MED ORDER — MOTEGRITY 2 MG PO TABS: 1.0000 | ORAL_TABLET | Freq: Every day | ORAL | 1 refills | Status: DC

## 2023-01-26 ENCOUNTER — Other Ambulatory Visit (HOSPITAL_COMMUNITY): Payer: Self-pay

## 2023-01-26 ENCOUNTER — Telehealth: Payer: Self-pay | Admitting: Pharmacy Technician

## 2023-01-26 NOTE — Telephone Encounter (Signed)
Pharmacy Patient Advocate Encounter   Received notification from Fax that prior authorization for MOTEGRITY 2MG  is required/requested.   Insurance verification completed.   The patient is insured through [employer] .   Per test claim: PA required; PA submitted to [employer] via Fax Key/confirmation #/[id number] Status is pending

## 2023-01-28 ENCOUNTER — Other Ambulatory Visit (HOSPITAL_COMMUNITY): Payer: Self-pay

## 2023-01-28 NOTE — Telephone Encounter (Signed)
Pharmacy Patient Advocate Encounter  Received notification from [employer] that Prior Authorization for MOTEGRITY 2MG  has been APPROVED from 10.10.24 to 10.10.25. Ran test claim, Copay is $76. This test claim was processed through [facility]- copay amounts may vary at other pharmacies due to pharmacy/plan contracts, or as the patient moves through the different stages of their insurance plan.   PA #/Case ID/Reference #:   Copay will be less if patient uses Mail Order.

## 2023-01-29 ENCOUNTER — Encounter: Payer: Self-pay | Admitting: Internal Medicine

## 2023-02-02 ENCOUNTER — Encounter (HOSPITAL_BASED_OUTPATIENT_CLINIC_OR_DEPARTMENT_OTHER): Payer: Self-pay | Admitting: Cardiology

## 2023-02-02 ENCOUNTER — Ambulatory Visit (HOSPITAL_BASED_OUTPATIENT_CLINIC_OR_DEPARTMENT_OTHER): Payer: Medicare Other | Admitting: Cardiology

## 2023-02-02 VITALS — BP 132/70 | HR 64 | Ht 60.0 in | Wt 134.0 lb

## 2023-02-02 DIAGNOSIS — I428 Other cardiomyopathies: Secondary | ICD-10-CM | POA: Diagnosis not present

## 2023-02-02 DIAGNOSIS — Z7901 Long term (current) use of anticoagulants: Secondary | ICD-10-CM

## 2023-02-02 DIAGNOSIS — I5042 Chronic combined systolic (congestive) and diastolic (congestive) heart failure: Secondary | ICD-10-CM

## 2023-02-02 DIAGNOSIS — I480 Paroxysmal atrial fibrillation: Secondary | ICD-10-CM

## 2023-02-02 DIAGNOSIS — N1832 Chronic kidney disease, stage 3b: Secondary | ICD-10-CM

## 2023-02-02 DIAGNOSIS — D6859 Other primary thrombophilia: Secondary | ICD-10-CM

## 2023-02-02 DIAGNOSIS — I10 Essential (primary) hypertension: Secondary | ICD-10-CM

## 2023-02-02 NOTE — Patient Instructions (Signed)
Medication Instructions:  Your physician recommends that you continue on your current medications as directed. Please refer to the Current Medication list given to you today.   Follow-Up: At [facility], you and your health needs are our priority.  As part of our continuing mission to provide you with exceptional heart care, we have created designated Provider Care Teams.  These Care Teams include your primary Cardiologist (physician) and Advanced Practice Providers (APPs -  Physician Assistants and Nurse Practitioners) who all work together to provide you with the care you need, when you need it.  We recommend signing up for the patient portal called "MyChart".  Sign up information is provided on this After Visit Summary.  MyChart is used to connect with patients for Virtual Visits (Telemedicine).  Patients are able to view lab/test results, encounter notes, upcoming appointments, etc.  Non-urgent messages can be sent to your provider as well.   To learn more about what you can do with MyChart, go to [email or web address].    Your next appointment:   6 months with Dr. Cristal Deer

## 2023-02-02 NOTE — Progress Notes (Signed)
Cardiology Office Note:  .    Date:  02/02/2023  ID:  Amy Hickman, DOB 11-Jul-1935, MRN [id number] PCP: [facility] Providers Cardiologist:  Jodelle Red, MD     History of Present Illness: .    Amy Hickman is a 87 y.o. female with a hx of persistent atrial fibrillation, hypertension, OSA on CPAP, CAD, CKD, GERD, COPD, and celiac disease, who is seen for follow up today. I met her during her admission 03/17/21 for the evaluation and management of atrial fibrillation.   History: Admission 03/15/21 for sepsis in the setting of influenza A, found to have afib RVR. EF slightly reduced. Difficult to rate control, required both metoprolol and diltiazem. Cardioversion 04/24/2021 got her into sinus rhythm, but she then returned to atrial fibrillation. Started on amiodarone, repeat DCCV 06/12/21 with restoration of sinus rhythm.    In 08/2022, she had been struggling with a severe bladder infection due to pseudomonas. She was started on ciprofloxacin, but they noted she may have to be admitted to the hospital for treatment. By noon she would feel more fatigued. Periodically complained of brief episodes of chest pain, similar to what she experienced in the past. They hadn't caused her to be concerned enough to call the office or go to the ER. She was working with GI regarding constipation. Completed a GoLytely bowel prep and was started on Linzess once daily.   At her visit 10/2022, she had a recent mechanical fall resulting in left toe fracture and a gluteus medius tendon tear of the right hip. Her physical activity had been severely limited for a month due to her recovery but she was working with PT and had started walking again with assistance of a rollator. She also complained of constant nausea for several weeks. They had questions regarding her amiodarone. Her thyroid function tests have been abnormal, and her primary care team was  recommending she stop the amiodarone. She had an upcoming appt with endocrinology to discuss as well. We discussed options at length, see below. On 12/02/2022 she presented to the ED following a fall with head trauma when her walker collapsed. Imaging found a large parietal scalp hematoma, no ICH, no acute fracture. She returned to the ER 12/22/2022 with concerns for persistent scalp hematoma. Reassured that her hematoma was diminishing from prior and would eventually reabsorb.  Today, she is accompanied by her daughter who also assists with the history. She reports feeling stable but is also struggling with insomnia and daytime somnolence. After waking up at 3 AM she has difficulty returning to sleep. Recently obtained a new mattress which will hopefully help. Some days her fatigue is worse than on other days and she will need to rest more often.   Occasionally she complains of left chest heaviness/aching discomfort which is always localized to the same area and has been an ongoing issue for some time. Frequency of this chest discomfort has remained stable. Usually occurs when she is still and not active.  Her blood pressure is 132/70 in the office, and has been improving at home. In the office her weight is 134 lbs; at home she weighed about 129 lbs. She notes that she typically eats smaller portions.  She denies any palpitations, shortness of breath, peripheral edema, lightheadedness, headaches, syncope, orthopnea, or PND.  ROS:  Please see the history of present illness. ROS otherwise negative except as noted.  (+) Insomnia (+) Daytime somnolence/Fatigue (+) Intermittent chest  heaviness  Studies Reviewed: .         CT Abdomen/Pelvis  01/20/2023: IMPRESSION: Mild mid gastric wall thickening, raising the possibility of gastritis.   Moderate colonic stool burden, suggesting constipation.   3 mm nonobstructing left lower pole renal calculus. No hydronephrosis.  CT Head   12/22/2022: IMPRESSION: 1. Right parietal scalp hematoma, slightly diminished in the interim over the past 2 weeks. 2. No acute intracranial abnormality.  No skull fracture. 3. Stable atrophy and chronic small vessel ischemia.  Physical Exam:    VS:  BP 132/70   Pulse 64   Ht 5' (1.524 m)   Wt 134 lb (60.8 kg)   SpO2 95%   BMI 26.17 kg/m    Wt Readings from Last 3 Encounters:  02/02/23 134 lb (60.8 kg)  01/13/23 137 lb 3.2 oz (62.2 kg)  12/22/22 131 lb (59.4 kg)    GEN: Well nourished, well developed in no acute distress HEENT: Normal, moist mucous membranes NECK: No JVD CARDIAC: regular rhythm, normal S1 and S2, no rubs or gallops. 2/6 systolic murmur and 1/4 diastolic murmur. VASCULAR: Radial and DP pulses 2+ bilaterally. No carotid bruits RESPIRATORY:  Clear to auscultation without rales, wheezing or rhonchi  ABDOMEN: Soft, non-tender, non-distended MUSCULOSKELETAL:  Ambulates independently SKIN: Warm and dry, no edema NEUROLOGIC:  Alert and oriented x 3. No focal neuro deficits noted. PSYCHIATRIC:  Normal affect   ASSESSMENT AND PLAN: .    Chronic kidney disease, stage 3b vs stage 4 Hypertension -continue hydralazine 100 mg TID, carvedilol, imdur -if BP rises in the future, could consider changing diltiazem to amlodipine if HR controlled -clonidine would be last step   Paroxysmal atrial fibrillation/atrial flutter Chronic systolic and diastolic heart failure Nonischemic cardiomyopathy -EF 40-45%. Not significantly changed with holding sinus rhythm -Cath with nonobstructive CAD -tolerating carvedilol -would prefer not to use diltiazem given low EF, but was not rate controlled on beta blocker alone. Will continue diltiazem for now, consider changing to amlodipine and increasing carvedilol dose in the future if needed -CHA2DS2/VAS Stroke Risk Points=8  -continue apixaban 5 mg BID; Cr right at border for dose reduction, meets age criteria but not weight. If Cr is >1.5  at follow up, would dose reduce to 2.5 mg BID -had long discussion re: amiodarone with thyroid history. She is followed by endocrinology. Thyroid has been stable, she had remained in sinus rhythm, so continue amiodarone. Labs checked 11/2022   OSA: continue CPAP   Moderate aortic stenosis Mild-moderate MR -last echo 12/2021, follow every 1-2 years or for change in symptoms   CAD, nonobstructive, without angina -given age, continue apixaban and no aspirin -continue atorvastatin  Dispo: Follow-up in 6 months, or sooner as needed.  I,Mathew Stumpf,acting as a Neurosurgeon for [employer], MD.,have documented all relevant documentation on the behalf of Jodelle Red, MD,as directed by  Jodelle Red, MD while in the presence of Jodelle Red, MD.  I, Jodelle Red, MD, have reviewed all documentation for this visit. The documentation on 02/02/23 for the exam, diagnosis, procedures, and orders are all accurate and complete.   Signed, Jodelle Red, MD

## 2023-02-04 ENCOUNTER — Ambulatory Visit (HOSPITAL_BASED_OUTPATIENT_CLINIC_OR_DEPARTMENT_OTHER): Payer: Medicare Other | Admitting: Cardiology

## 2023-02-05 ENCOUNTER — Other Ambulatory Visit: Payer: Self-pay

## 2023-02-05 ENCOUNTER — Other Ambulatory Visit (HOSPITAL_COMMUNITY): Payer: Self-pay

## 2023-02-08 ENCOUNTER — Other Ambulatory Visit (HOSPITAL_COMMUNITY): Payer: Self-pay

## 2023-03-31 ENCOUNTER — Other Ambulatory Visit: Payer: Self-pay | Admitting: Internal Medicine

## 2023-04-07 ENCOUNTER — Encounter: Payer: Self-pay | Admitting: Internal Medicine

## 2023-04-07 ENCOUNTER — Telehealth: Payer: Self-pay | Admitting: Cardiology

## 2023-04-07 ENCOUNTER — Encounter (HOSPITAL_BASED_OUTPATIENT_CLINIC_OR_DEPARTMENT_OTHER): Payer: Self-pay

## 2023-04-07 DIAGNOSIS — I517 Cardiomegaly: Secondary | ICD-10-CM

## 2023-04-07 NOTE — Telephone Encounter (Signed)
Pt c/o swelling/edema: STAT if pt has developed SOB within 24 hours  If swelling, where is the swelling located? LE  How much weight have you gained and in what time span? 10 pounds  Have you gained 2 pounds in a day or 5 pounds in a week? Yes   Do you have a log of your daily weights (if so, list)? No   Are you currently taking a fluid pill?    Are you currently SOB? No   Have you traveled recently in a car or plane for an extended period of time? No

## 2023-04-08 ENCOUNTER — Other Ambulatory Visit: Payer: Self-pay

## 2023-04-08 MED ORDER — MOTEGRITY 2 MG PO TABS: ORAL_TABLET | ORAL | 3 refills | Status: AC

## 2023-04-08 MED ORDER — FUROSEMIDE 40 MG PO TABS: 40.0000 mg | ORAL_TABLET | Freq: Every day | ORAL | 0 refills | Status: DC

## 2023-04-08 NOTE — Telephone Encounter (Addendum)
Called and spoke with patient regarding swelling Feet and legs swelling up into calves, feel very tight Started several days ago but initially thought was just because she was up on them a lot. Swelling goes down very little at night  Sits with feet elevated  Weighs herself daily with weights usually between 129-131 Yesterday weight up to 139 Denies shortness of breath or change in diet Used to be on Lasix however was discontinued secondary to kidney disease See Nephrologist, last labs in [facility] (November)   Will discuss with Dr Cristal Deer and call patient back with recommendations

## 2023-04-08 NOTE — Telephone Encounter (Addendum)
Discussed with Dr Cristal Deer who recommended Lasix 40 mg today-Sunday, BMET Monday and update with how she is doing. If swelling continues needs to reach out to her nephrologist since they are the ones that d/c Lasix   Advised patient, verbalized understanding

## 2023-04-08 NOTE — Telephone Encounter (Signed)
Patient is calling back stating she does not have any lasix. She is requesting a prescription be sent to [facility] in [place].   Please advise.

## 2023-04-08 NOTE — Telephone Encounter (Signed)
Rx sent to preferred pharmacy and BMET has been ordered.

## 2023-04-08 NOTE — Telephone Encounter (Signed)
See phone note 12/18

## 2023-04-11 ENCOUNTER — Encounter (HOSPITAL_BASED_OUTPATIENT_CLINIC_OR_DEPARTMENT_OTHER): Payer: Self-pay

## 2023-04-15 ENCOUNTER — Other Ambulatory Visit: Payer: Self-pay | Admitting: Cardiology

## 2023-04-16 NOTE — Telephone Encounter (Signed)
Glad swelling improving! She's been on abx for COPD exacerbation. Can likely do blood work Monday. Likely follow up with nephrology as they previously stopped her diuresis.  Alver Sorrow, NP

## 2023-05-13 ENCOUNTER — Other Ambulatory Visit: Payer: Self-pay

## 2023-06-15 ENCOUNTER — Other Ambulatory Visit (HOSPITAL_BASED_OUTPATIENT_CLINIC_OR_DEPARTMENT_OTHER): Payer: Self-pay | Admitting: Cardiology

## 2023-06-15 DIAGNOSIS — I480 Paroxysmal atrial fibrillation: Secondary | ICD-10-CM

## 2023-06-15 NOTE — Telephone Encounter (Signed)
 Prescription refill request for Eliquis received. Indication:afib Last office visit:10/24 Scr:1.25  2/25 Age: 88 Weight:60.8  kg  Prescription refilled

## 2023-06-18 ENCOUNTER — Other Ambulatory Visit: Payer: Self-pay

## 2023-06-21 ENCOUNTER — Telehealth: Payer: Self-pay | Admitting: Internal Medicine

## 2023-06-21 NOTE — Telephone Encounter (Signed)
 Patient's son is returning your call for message below. Please advise.

## 2023-06-21 NOTE — Telephone Encounter (Signed)
 Patients son called stated she woke up with her stomach really extended. He would like to speak with a nurse as soon as possible.

## 2023-06-21 NOTE — Telephone Encounter (Signed)
 Left message on machine to call back

## 2023-06-22 NOTE — Telephone Encounter (Signed)
 The pt complains of generalized abd pain that is a 8/10 that is constant.  She has a very distended abd that is hard and very uncomfortable.  She is having regular bowel movements as of yesterday.  She is passing gas. No fever, no nausea or vomiting.   The pt son states that he is going to take the pt to the ED now and would like Dr Rhea Belton to be aware.

## 2023-06-22 NOTE — Telephone Encounter (Signed)
Agree with plan for ER evaluation.

## 2023-06-22 NOTE — Telephone Encounter (Signed)
 Patients granddaughter called requesting for nurse to call patient to discuss next steps. Please advise.

## 2023-06-23 ENCOUNTER — Emergency Department (HOSPITAL_BASED_OUTPATIENT_CLINIC_OR_DEPARTMENT_OTHER)

## 2023-06-23 ENCOUNTER — Emergency Department (HOSPITAL_BASED_OUTPATIENT_CLINIC_OR_DEPARTMENT_OTHER)
Admission: EM | Admit: 2023-06-23 | Discharge: 2023-06-23 | Disposition: A | Discharge status: Home / Self Care | Attending: Emergency Medicine | Admitting: Emergency Medicine

## 2023-06-23 ENCOUNTER — Encounter (HOSPITAL_BASED_OUTPATIENT_CLINIC_OR_DEPARTMENT_OTHER): Payer: Self-pay | Admitting: Emergency Medicine

## 2023-06-23 ENCOUNTER — Other Ambulatory Visit: Payer: Self-pay

## 2023-06-23 DIAGNOSIS — I2510 Atherosclerotic heart disease of native coronary artery without angina pectoris: Secondary | ICD-10-CM | POA: Insufficient documentation

## 2023-06-23 DIAGNOSIS — K5900 Constipation, unspecified: Secondary | ICD-10-CM | POA: Diagnosis not present

## 2023-06-23 DIAGNOSIS — I509 Heart failure, unspecified: Secondary | ICD-10-CM | POA: Diagnosis not present

## 2023-06-23 DIAGNOSIS — Z7901 Long term (current) use of anticoagulants: Secondary | ICD-10-CM | POA: Insufficient documentation

## 2023-06-23 DIAGNOSIS — R1084 Generalized abdominal pain: Secondary | ICD-10-CM

## 2023-06-23 DIAGNOSIS — R109 Unspecified abdominal pain: Secondary | ICD-10-CM | POA: Diagnosis present

## 2023-06-23 LAB — URINALYSIS, MICROSCOPIC (REFLEX): RBC / HPF: NONE SEEN RBC/hpf (ref 0–5)

## 2023-06-23 LAB — URINALYSIS, ROUTINE W REFLEX MICROSCOPIC
Bilirubin Urine: NEGATIVE
Glucose, UA: NEGATIVE mg/dL
Hgb urine dipstick: NEGATIVE
Ketones, ur: NEGATIVE mg/dL
Nitrite: NEGATIVE
Protein, ur: 30 mg/dL — AB
Specific Gravity, Urine: 1.015 (ref 1.005–1.030)
pH: 7 (ref 5.0–8.0)

## 2023-06-23 LAB — CBC WITH DIFFERENTIAL/PLATELET
Abs Immature Granulocytes: 0.03 10*3/uL (ref 0.00–0.07)
Basophils Absolute: 0 10*3/uL (ref 0.0–0.1)
Basophils Relative: 1 %
Eosinophils Absolute: 0.2 10*3/uL (ref 0.0–0.5)
Eosinophils Relative: 3 %
HCT: 34.9 % — ABNORMAL LOW (ref 36.0–46.0)
Hemoglobin: 11 g/dL — ABNORMAL LOW (ref 12.0–15.0)
Immature Granulocytes: 1 %
Lymphocytes Relative: 14 %
Lymphs Abs: 0.8 10*3/uL (ref 0.7–4.0)
MCH: 30.2 pg (ref 26.0–34.0)
MCHC: 31.5 g/dL (ref 30.0–36.0)
MCV: 95.9 fL (ref 80.0–100.0)
Monocytes Absolute: 0.7 10*3/uL (ref 0.1–1.0)
Monocytes Relative: 11 %
Neutro Abs: 4.4 10*3/uL (ref 1.7–7.7)
Neutrophils Relative %: 70 %
Platelets: 238 10*3/uL (ref 150–400)
RBC: 3.64 MIL/uL — ABNORMAL LOW (ref 3.87–5.11)
RDW: 15.8 % — ABNORMAL HIGH (ref 11.5–15.5)
WBC: 6.1 10*3/uL (ref 4.0–10.5)
nRBC: 0 % (ref 0.0–0.2)

## 2023-06-23 LAB — COMPREHENSIVE METABOLIC PANEL
ALT: 36 U/L (ref 0–44)
AST: 73 U/L — ABNORMAL HIGH (ref 15–41)
Albumin: 3.7 g/dL (ref 3.5–5.0)
Alkaline Phosphatase: 59 U/L (ref 38–126)
Anion gap: 6 (ref 5–15)
BUN: 25 mg/dL — ABNORMAL HIGH (ref 8–23)
CO2: 23 mmol/L (ref 22–32)
Calcium: 9.4 mg/dL (ref 8.9–10.3)
Chloride: 102 mmol/L (ref 98–111)
Creatinine, Ser: 1.26 mg/dL — ABNORMAL HIGH (ref 0.44–1.00)
GFR, Estimated: 42 mL/min — ABNORMAL LOW (ref >60)
Glucose, Bld: 99 mg/dL (ref 70–99)
Potassium: 4.6 mmol/L (ref 3.5–5.1)
Sodium: 131 mmol/L — ABNORMAL LOW (ref 135–145)
Total Bilirubin: 0.7 mg/dL (ref 0.0–1.2)
Total Protein: 6.7 g/dL (ref 6.5–8.1)

## 2023-06-23 LAB — LIPASE, BLOOD: Lipase: 32 U/L (ref 11–51)

## 2023-06-23 MED ORDER — ONDANSETRON HCL 4 MG/2ML IJ SOLN
4.0000 mg | Freq: Once | INTRAMUSCULAR | Status: AC
Start: 2023-06-23 — End: 2023-06-23
  Administered 2023-06-23: 4 mg via INTRAVENOUS
  Filled 2023-06-23: qty 2

## 2023-06-23 MED ORDER — IOHEXOL 300 MG/ML  SOLN
80.0000 mL | Freq: Once | INTRAMUSCULAR | Status: AC | PRN
  Administered 2023-06-23: 80 mL via INTRAVENOUS

## 2023-06-23 MED ORDER — FOSFOMYCIN TROMETHAMINE 3 G PO PACK
3.0000 g | PACK | Freq: Once | ORAL | Status: AC
  Administered 2023-06-23: 3 g via ORAL
  Filled 2023-06-23: qty 3

## 2023-06-23 MED ORDER — FENTANYL CITRATE PF 50 MCG/ML IJ SOSY
50.0000 ug | PREFILLED_SYRINGE | Freq: Once | INTRAMUSCULAR | Status: AC
  Administered 2023-06-23: 50 ug via INTRAVENOUS
  Filled 2023-06-23: qty 1

## 2023-06-23 MED ORDER — SODIUM CHLORIDE 0.9 % IV BOLUS
1000.0000 mL | Freq: Once | INTRAVENOUS | Status: AC
  Administered 2023-06-23: 1000 mL via INTRAVENOUS

## 2023-06-23 MED ORDER — ONDANSETRON HCL 4 MG/2ML IJ SOLN
4.0000 mg | Freq: Once | INTRAMUSCULAR | Status: AC
  Administered 2023-06-23: 4 mg via INTRAVENOUS
  Filled 2023-06-23: qty 2

## 2023-06-23 NOTE — Discharge Instructions (Signed)
 Continue titrating MiraLAX as we discussed.  I have treated you with antibiotics for possible urinary tract infection.  Will call you if we need to change antibiotics.  This is a long-acting antibiotic and you only need 1 dose.  Follow-up with your primary care doctor.  Return if symptoms worsen.

## 2023-06-23 NOTE — ED Provider Notes (Signed)
 [place] [facility] AT [facility] Provider Note   CSN: [id number] Arrival date & time: 06/23/23  1622     History  Chief Complaint  Patient presents with   Abdominal Pain    Amy Hickman is a 88 y.o. female.  Patient here with abdominal bloating for the last couple days getting worse having decreased stool output not passing gas having nausea but no vomiting.  She has history of bowel obstructions bowel resection in the past.  Has any fevers or chills.  Denies any difficulty of urination or pain with urination.  She is on Eliquis for A-fib.  Heart failure history of CAD history.  She denies any chest pain shortness of breath weakness numbness tingling.  The history is provided by the patient.       Home Medications Prior to Admission medications   Medication Sig Start Date End Date Taking? Authorizing Provider  acetaminophen (TYLENOL) 500 MG tablet Take 1 tablet (500 mg total) by mouth every 6 (six) hours as needed for pain. Patient taking differently: Take 1,000 mg by mouth as needed for pain or moderate pain (pain score 4-6). 06/24/12   Gerhard Munch, MD  amiodarone (PACERONE) 200 MG tablet Take 1 tablet (200 mg total) by mouth daily. 11/04/22   Jodelle Red, MD  carvedilol (COREG) 12.5 MG tablet Take 1 tablet (12.5 mg total) by mouth 2 (two) times daily. 12/08/22   Jodelle Red, MD  cefdinir (OMNICEF) 300 MG capsule Take 1 capsule (300 mg total) by mouth daily. 10/19/22   Jeannie Fend, PA-C  clidinium-chlordiazePOXIDE (LIBRAX) 5-2.5 MG capsule Take 2 capsules by mouth daily as needed (cramping). 02/28/21   [provider]  CRANBERRY-VITAMIN C PO Take 1 capsule by mouth in the morning.    [provider]  diltiazem (CARDIZEM CD) 360 MG 24 hr capsule Take 1 capsule (360 mg total) by mouth daily. 06/12/21   Jodelle Red, MD  ELIQUIS 5 MG TABS tablet TAKE 1 TABLET TWICE A DAY 06/15/23   Jodelle Red,  MD  esomeprazole (NEXIUM) 40 MG capsule Take 40 mg by mouth daily before breakfast.    [provider]  fenofibrate 54 MG tablet Take 54 mg by mouth daily. 07/15/22   [provider]  furosemide (LASIX) 40 MG tablet Take 1 tablet (40 mg total) by mouth daily. 04/08/23 07/07/23  Jodelle Red, MD  hydrALAZINE (APRESOLINE) 100 MG tablet Take 1 tablet (100 mg total) by mouth 3 (three) times daily. 11/04/22   Jodelle Red, MD  Hyoscyamine Sulfate SL (OSCIMIN) 0.125 MG SUBL Take 0.125 mg by mouth as needed (cramping). 12/05/21   [provider]  isosorbide dinitrate (ISORDIL) 10 MG tablet Take 10 mg by mouth 2 (two) times daily. 12/31/21   [provider]  latanoprost (XALATAN) 0.005 % ophthalmic solution Place 1 drop into both eyes at bedtime. 08/31/22   [provider]  levothyroxine (SYNTHROID) 112 MCG tablet Take 112 mcg by mouth daily before breakfast. 07/24/22   [provider]  ondansetron (ZOFRAN) 4 MG tablet TAKE 1 TABLET EVERY 8 HOURS AS NEEDED FOR NAUSEA OR VOMITING 12/07/22   Imogene Burn, MD  pilocarpine (PILOCAR) 1 % ophthalmic solution Place 1 drop into the right eye 2 (two) times daily. 08/31/22   [provider]  polyethylene glycol (MIRALAX / GLYCOLAX) 17 g packet Take 17 g by mouth 2 (two) times daily. Patient taking differently: Take 68 g by mouth 2 (two) times daily. 07/13/22  Danford, Earl Lites, MD  polyvinyl alcohol (ARTIFICIAL TEARS) 1.4 % ophthalmic solution Place 1 drop into both eyes as needed for dry eyes.    [provider]  Prucalopride Succinate (MOTEGRITY) 2 MG TABS TAKE 1 TABLET(2 MG) BY MOUTH DAILY 04/08/23   Pyrtle, Carie Caddy, MD  Vitamin D, Ergocalciferol, (DRISDOL) 1.25 MG (50000 UNIT) CAPS capsule Take 50,000 Units by mouth every 7 (seven) days. 05/22/21   [provider]      Allergies    Cephalexin, Hydrocodone-acetaminophen, Ativan [lorazepam], Augmentin [amoxicillin-pot  clavulanate], Avelox [moxifloxacin], Brimonidine, Chlorzoxazone, Ciprofloxacin hcl, Clavulanic acid, Crestor [rosuvastatin], Ezetimibe, Gluten meal, Lansoprazole, Nitrofuran derivatives, Phenergan [promethazine hcl], Wheat, Codeine, Darvon [propoxyphene hcl], and Doxycycline    Review of Systems   Review of Systems  Physical Exam Updated Vital Signs BP (!) 149/70   Pulse 60   Temp 98.2 F (36.8 C) (Oral)   Resp 16   Ht 5\' 3"  (1.6 m)   Wt 60.3 kg   SpO2 95%   BMI 23.56 kg/m  Physical Exam Vitals and nursing note reviewed.  Constitutional:      General: She is not in acute distress.    Appearance: She is well-developed.  HENT:     Head: Normocephalic and atraumatic.     Mouth/Throat:     Mouth: Mucous membranes are moist.  Eyes:     Extraocular Movements: Extraocular movements intact.     Conjunctiva/sclera: Conjunctivae normal.  Cardiovascular:     Rate and Rhythm: Normal rate and regular rhythm.     Heart sounds: Normal heart sounds. No murmur heard. Pulmonary:     Effort: Pulmonary effort is normal. No respiratory distress.     Breath sounds: Normal breath sounds.  Abdominal:     General: There is distension.     Palpations: Abdomen is soft.     Tenderness: There is abdominal tenderness.  Musculoskeletal:        General: No swelling.     Cervical back: Neck supple.  Skin:    General: Skin is warm and dry.     Capillary Refill: Capillary refill takes less than 2 seconds.  Neurological:     Mental Status: She is alert.  Psychiatric:        Mood and Affect: Mood normal.     ED Results / Procedures / Treatments   Labs (all labs ordered are listed, but only abnormal results are displayed) Labs Reviewed  CBC WITH DIFFERENTIAL/PLATELET - Abnormal; Notable for the following components:      Result Value   RBC 3.64 (*)    Hemoglobin 11.0 (*)    HCT 34.9 (*)    RDW 15.8 (*)    All other components within normal limits  COMPREHENSIVE METABOLIC PANEL - Abnormal;  Notable for the following components:   Sodium 131 (*)    BUN 25 (*)    Creatinine, Ser 1.26 (*)    AST 73 (*)    GFR, Estimated 42 (*)    All other components within normal limits  URINALYSIS, ROUTINE W REFLEX MICROSCOPIC - Abnormal; Notable for the following components:   Protein, ur 30 (*)    Leukocytes,Ua SMALL (*)    All other components within normal limits  URINALYSIS, MICROSCOPIC (REFLEX) - Abnormal; Notable for the following components:   Bacteria, UA MANY (*)    All other components within normal limits  URINE CULTURE  LIPASE, BLOOD    EKG None  Radiology CT ABDOMEN PELVIS W CONTRAST Result Date:  06/23/2023 CLINICAL DATA:  Abdominal pain. EXAM: CT ABDOMEN AND PELVIS WITH CONTRAST TECHNIQUE: Multidetector CT imaging of the abdomen and pelvis was performed using the standard protocol following bolus administration of intravenous contrast. RADIATION DOSE REDUCTION: This exam was performed according to the departmental dose-optimization program which includes automated exposure control, adjustment of the mA and/or kV according to patient size and/or use of iterative reconstruction technique. CONTRAST:  80mL OMNIPAQUE IOHEXOL 300 MG/ML  SOLN COMPARISON:  January 20, 2023 FINDINGS: Lower chest: No acute abnormality. Hepatobiliary: No focal liver abnormality is seen. Status post cholecystectomy. No biliary dilatation. Pancreas: Unremarkable. No pancreatic ductal dilatation or surrounding inflammatory changes. Spleen: Normal in size without focal abnormality. Adrenals/Urinary Tract: Adrenal glands are unremarkable. Kidneys are normal, without renal calculi, focal lesion, or hydronephrosis. A mild-to-moderate amount of air is seen within the urinary bladder lumen. Stomach/Bowel: Surgical clips are seen along the gastroesophageal junction. The appendix is surgically absent. A very large stool burden is noted. A redundant sigmoid colon is seen. Surgically anastomosed bowel is noted within the  posterior aspect of the pelvis on the right. No evidence of bowel wall thickening, distention, or inflammatory changes. Vascular/Lymphatic: Aortic atherosclerosis. An inferior vena cava filter is present. No enlarged abdominal or pelvic lymph nodes. Reproductive: Status post hysterectomy. No adnexal masses. Other: No abdominal wall hernia or abnormality. No abdominopelvic ascites. Musculoskeletal: There is moderate severity levoscoliosis of the lumbar spine with marked severity multilevel degenerative changes. IMPRESSION: 1. Very large stool burden without evidence of bowel obstruction. 2. Mild-to-moderate amount of air within the urinary bladder lumen, which may be secondary to recent instrumentation. 3. Evidence of prior cholecystectomy, appendectomy and hysterectomy. 4. Moderate severity levoscoliosis of the lumbar spine with marked severity multilevel degenerative changes. 5. Aortic atherosclerosis. Aortic Atherosclerosis (ICD10-I70.0). Electronically Signed   By: Aram Candela M.D.   On: 06/23/2023 21:50    Procedures Procedures    Medications Ordered in ED Medications  fosfomycin (MONUROL) packet 3 g (has no administration in time range)  sodium chloride 0.9 % bolus 1,000 mL (0 mLs Intravenous Stopped 06/23/23 2021)  fentaNYL (SUBLIMAZE) injection 50 mcg (50 mcg Intravenous Given 06/23/23 1811)  ondansetron (ZOFRAN) injection 4 mg (4 mg Intravenous Given 06/23/23 1810)  ondansetron (ZOFRAN) injection 4 mg (4 mg Intravenous Given 06/23/23 1932)  iohexol (OMNIPAQUE) 300 MG/ML solution 80 mL (80 mLs Intravenous Contrast Given 06/23/23 1920)    ED Course/ Medical Decision Making/ A&P                                 Medical Decision Making Amount and/or Complexity of Data Reviewed Labs: ordered. Radiology: ordered.  Risk Prescription drug management.   Amy Hickman is here with abdominal pain concern for obstruction.  History of A-fib on Eliquis she states she has had a history of bowel  obstructions bowel resection in the past.  This occurred may be in the 70s she said.  She had her appendix removed and gallbladder removed in the past as well.  She has had decreased stool output here the last few days now not passing as much gas feeling nauseous but has not vomited.  Decreased appetite.  She is more bloated and distended and concern for obstruction.  Differential diagnosis likely obstruction versus ileus versus constipation versus less likely other intra-abdominal process.  Will check basic labs CBC CMP lipase urinalysis CT scan abdomen and pelvis.  Will give IV fluids IV  Zofran IV fentanyl and reevaluate.  Lab work is overall unremarkable per my review and interpretation.  No significant leukocytosis anemia or electrolyte abnormality.  Urinalysis equivocal for infection.  CT scan showed large stool burden but no evidence of bowel obstruction.  She did have some air within the bladder but she did have some recent instrumentation.  CT scans unremarkable otherwise.  Will give her a dose of fosfomycin for possible uncomplicated UTI.  Will send urine culture.  She has chronic constipation issues.  We talked about titrating MiraLAX more aggressively.  Follow-up with primary care doctor.  Discharged in good condition.  This chart was dictated using voice recognition software.  Despite best efforts to proofread,  errors can occur which can change the documentation meaning.         Final Clinical Impression(s) / ED Diagnoses Final diagnoses:  Generalized abdominal pain  Constipation, unspecified constipation type    Rx / DC Orders ED Discharge Orders     None         Virgina Norfolk, DO 06/23/23 2207

## 2023-06-23 NOTE — ED Triage Notes (Signed)
 C/o ABD pain and bloating since last week. Still having BM. ABD is hard and distended. Denies n/v/d.

## 2023-06-23 NOTE — ED Notes (Signed)
 Pt had a large very loose bowel movement, unable to get urine sample d/t bowel contamination

## 2023-06-25 ENCOUNTER — Other Ambulatory Visit (HOSPITAL_BASED_OUTPATIENT_CLINIC_OR_DEPARTMENT_OTHER): Payer: Self-pay | Admitting: Cardiology

## 2023-06-29 LAB — URINE CULTURE: Culture: >=100000 — AB

## 2023-06-30 ENCOUNTER — Telehealth (HOSPITAL_BASED_OUTPATIENT_CLINIC_OR_DEPARTMENT_OTHER): Payer: Self-pay

## 2023-06-30 NOTE — Telephone Encounter (Signed)
 Post ED Visit - Positive Culture Follow-up  Culture report reviewed by antimicrobial stewardship pharmacist: [facility] [x]  Daylene Posey, Pharm.D. []  Celedonio Miyamoto, Pharm.D., BCPS AQ-ID []  Garvin Fila, Pharm.D., BCPS []  Georgina Pillion, [address].D., BCPS []  [place], [address].D., BCPS, AAHIVP []  Estella Husk, Pharm.D., BCPS, AAHIVP []  Lysle Pearl, PharmD, BCPS []  Phillips Climes, PharmD, BCPS []  Agapito Games, PharmD, BCPS []  Verlan Friends, PharmD []  Mervyn Gay, PharmD, BCPS []  Vinnie Level, PharmD  [facility] []  Len Childs, PharmD []  Greer Pickerel, PharmD []  Adalberto Cole, PharmD []  Perlie Gold, Rph []  Lonell Face) Jean Rosenthal, PharmD []  Earl Many, PharmD []  Junita Push, PharmD []  Dorna Leitz, PharmD []  Terrilee Files, PharmD []  Lynann Beaver, PharmD []  Keturah Barre, PharmD []  Loralee Pacas, PharmD []  Bernadene Person, PharmD   Positive urine culture Treated with Fosfomycin, organism sensitive to the same and no further patient follow-up is required at this time.  Sandria Senter 06/30/2023, 8:34 AM

## 2023-07-04 ENCOUNTER — Emergency Department (HOSPITAL_BASED_OUTPATIENT_CLINIC_OR_DEPARTMENT_OTHER)

## 2023-07-04 ENCOUNTER — Other Ambulatory Visit: Payer: Self-pay

## 2023-07-04 ENCOUNTER — Encounter (HOSPITAL_BASED_OUTPATIENT_CLINIC_OR_DEPARTMENT_OTHER): Payer: Self-pay | Admitting: Emergency Medicine

## 2023-07-04 ENCOUNTER — Inpatient Hospital Stay (HOSPITAL_BASED_OUTPATIENT_CLINIC_OR_DEPARTMENT_OTHER)
Admission: EM | Admit: 2023-07-04 | Discharge: 2023-07-09 | DRG: 690 | Disposition: A | Discharge status: Home / Self Care | Attending: Internal Medicine | Admitting: Internal Medicine

## 2023-07-04 DIAGNOSIS — Z886 Allergy status to analgesic agent status: Secondary | ICD-10-CM

## 2023-07-04 DIAGNOSIS — B9689 Other specified bacterial agents as the cause of diseases classified elsewhere: Secondary | ICD-10-CM | POA: Diagnosis present

## 2023-07-04 DIAGNOSIS — Z8249 Family history of ischemic heart disease and other diseases of the circulatory system: Secondary | ICD-10-CM

## 2023-07-04 DIAGNOSIS — I130 Hypertensive heart and chronic kidney disease with heart failure and stage 1 through stage 4 chronic kidney disease, or unspecified chronic kidney disease: Secondary | ICD-10-CM | POA: Diagnosis present

## 2023-07-04 DIAGNOSIS — E871 Hypo-osmolality and hyponatremia: Secondary | ICD-10-CM | POA: Diagnosis present

## 2023-07-04 DIAGNOSIS — K5909 Other constipation: Secondary | ICD-10-CM | POA: Diagnosis present

## 2023-07-04 DIAGNOSIS — Z1612 Extended spectrum beta lactamase (ESBL) resistance: Secondary | ICD-10-CM | POA: Diagnosis present

## 2023-07-04 DIAGNOSIS — N390 Urinary tract infection, site not specified: Secondary | ICD-10-CM | POA: Diagnosis present

## 2023-07-04 DIAGNOSIS — E860 Dehydration: Secondary | ICD-10-CM | POA: Diagnosis present

## 2023-07-04 DIAGNOSIS — Z91018 Allergy to other foods: Secondary | ICD-10-CM

## 2023-07-04 DIAGNOSIS — Z885 Allergy status to narcotic agent status: Secondary | ICD-10-CM

## 2023-07-04 DIAGNOSIS — Z800 Family history of malignant neoplasm of digestive organs: Secondary | ICD-10-CM

## 2023-07-04 DIAGNOSIS — I2510 Atherosclerotic heart disease of native coronary artery without angina pectoris: Secondary | ICD-10-CM | POA: Diagnosis present

## 2023-07-04 DIAGNOSIS — I5042 Chronic combined systolic (congestive) and diastolic (congestive) heart failure: Secondary | ICD-10-CM | POA: Diagnosis present

## 2023-07-04 DIAGNOSIS — N1832 Chronic kidney disease, stage 3b: Secondary | ICD-10-CM | POA: Diagnosis present

## 2023-07-04 DIAGNOSIS — Z881 Allergy status to other antibiotic agents status: Secondary | ICD-10-CM

## 2023-07-04 DIAGNOSIS — Z7901 Long term (current) use of anticoagulants: Secondary | ICD-10-CM

## 2023-07-04 DIAGNOSIS — E039 Hypothyroidism, unspecified: Secondary | ICD-10-CM | POA: Diagnosis present

## 2023-07-04 DIAGNOSIS — I5022 Chronic systolic (congestive) heart failure: Secondary | ICD-10-CM | POA: Diagnosis not present

## 2023-07-04 DIAGNOSIS — N12 Tubulo-interstitial nephritis, not specified as acute or chronic: Secondary | ICD-10-CM | POA: Diagnosis present

## 2023-07-04 DIAGNOSIS — J449 Chronic obstructive pulmonary disease, unspecified: Secondary | ICD-10-CM | POA: Diagnosis present

## 2023-07-04 DIAGNOSIS — I4819 Other persistent atrial fibrillation: Secondary | ICD-10-CM | POA: Diagnosis present

## 2023-07-04 DIAGNOSIS — Z79890 Hormone replacement therapy (postmenopausal): Secondary | ICD-10-CM | POA: Diagnosis not present

## 2023-07-04 DIAGNOSIS — K219 Gastro-esophageal reflux disease without esophagitis: Secondary | ICD-10-CM | POA: Diagnosis present

## 2023-07-04 DIAGNOSIS — N3010 Interstitial cystitis (chronic) without hematuria: Secondary | ICD-10-CM | POA: Diagnosis present

## 2023-07-04 DIAGNOSIS — Z888 Allergy status to other drugs, medicaments and biological substances status: Secondary | ICD-10-CM | POA: Diagnosis not present

## 2023-07-04 DIAGNOSIS — Z87891 Personal history of nicotine dependence: Secondary | ICD-10-CM

## 2023-07-04 DIAGNOSIS — B961 Klebsiella pneumoniae [K. pneumoniae] as the cause of diseases classified elsewhere: Secondary | ICD-10-CM | POA: Diagnosis present

## 2023-07-04 DIAGNOSIS — Z808 Family history of malignant neoplasm of other organs or systems: Secondary | ICD-10-CM

## 2023-07-04 DIAGNOSIS — Z86718 Personal history of other venous thrombosis and embolism: Secondary | ICD-10-CM

## 2023-07-04 DIAGNOSIS — Z96659 Presence of unspecified artificial knee joint: Secondary | ICD-10-CM | POA: Diagnosis present

## 2023-07-04 DIAGNOSIS — N3090 Cystitis, unspecified without hematuria: Principal | ICD-10-CM

## 2023-07-04 DIAGNOSIS — Z87440 Personal history of urinary (tract) infections: Secondary | ICD-10-CM

## 2023-07-04 DIAGNOSIS — Z79899 Other long term (current) drug therapy: Secondary | ICD-10-CM

## 2023-07-04 DIAGNOSIS — Z90710 Acquired absence of both cervix and uterus: Secondary | ICD-10-CM

## 2023-07-04 DIAGNOSIS — Z801 Family history of malignant neoplasm of trachea, bronchus and lung: Secondary | ICD-10-CM

## 2023-07-04 DIAGNOSIS — Z8052 Family history of malignant neoplasm of bladder: Secondary | ICD-10-CM

## 2023-07-04 LAB — URINALYSIS, ROUTINE W REFLEX MICROSCOPIC
Bilirubin Urine: NEGATIVE
Glucose, UA: NEGATIVE mg/dL
Hgb urine dipstick: NEGATIVE
Ketones, ur: NEGATIVE mg/dL
Leukocytes,Ua: NEGATIVE
Nitrite: NEGATIVE
Protein, ur: 100 mg/dL — AB
Specific Gravity, Urine: 1.02 (ref 1.005–1.030)
pH: 7 (ref 5.0–8.0)

## 2023-07-04 LAB — CBC WITH DIFFERENTIAL/PLATELET
Abs Immature Granulocytes: 0.03 10*3/uL (ref 0.00–0.07)
Basophils Absolute: 0.1 10*3/uL (ref 0.0–0.1)
Basophils Relative: 1 %
Eosinophils Absolute: 0.3 10*3/uL (ref 0.0–0.5)
Eosinophils Relative: 4 %
HCT: 34.4 % — ABNORMAL LOW (ref 36.0–46.0)
Hemoglobin: 11.1 g/dL — ABNORMAL LOW (ref 12.0–15.0)
Immature Granulocytes: 1 %
Lymphocytes Relative: 13 %
Lymphs Abs: 0.8 10*3/uL (ref 0.7–4.0)
MCH: 30.4 pg (ref 26.0–34.0)
MCHC: 32.3 g/dL (ref 30.0–36.0)
MCV: 94.2 fL (ref 80.0–100.0)
Monocytes Absolute: 0.7 10*3/uL (ref 0.1–1.0)
Monocytes Relative: 12 %
Neutro Abs: 4.4 10*3/uL (ref 1.7–7.7)
Neutrophils Relative %: 69 %
Platelets: 278 10*3/uL (ref 150–400)
RBC: 3.65 MIL/uL — ABNORMAL LOW (ref 3.87–5.11)
RDW: 16 % — ABNORMAL HIGH (ref 11.5–15.5)
WBC: 6.3 10*3/uL (ref 4.0–10.5)
nRBC: 0 % (ref 0.0–0.2)

## 2023-07-04 LAB — BASIC METABOLIC PANEL
Anion gap: 8 (ref 5–15)
BUN: 29 mg/dL — ABNORMAL HIGH (ref 8–23)
CO2: 23 mmol/L (ref 22–32)
Calcium: 9.3 mg/dL (ref 8.9–10.3)
Chloride: 102 mmol/L (ref 98–111)
Creatinine, Ser: 1.3 mg/dL — ABNORMAL HIGH (ref 0.44–1.00)
GFR, Estimated: 40 mL/min — ABNORMAL LOW (ref >60)
Glucose, Bld: 94 mg/dL (ref 70–99)
Potassium: 4.3 mmol/L (ref 3.5–5.1)
Sodium: 133 mmol/L — ABNORMAL LOW (ref 135–145)

## 2023-07-04 LAB — URINALYSIS, MICROSCOPIC (REFLEX)

## 2023-07-04 MED ORDER — MORPHINE SULFATE (PF) 4 MG/ML IV SOLN
4.0000 mg | Freq: Once | INTRAVENOUS | Status: AC
  Administered 2023-07-04: 4 mg via INTRAVENOUS
  Filled 2023-07-04: qty 1

## 2023-07-04 MED ORDER — ISOSORBIDE DINITRATE 10 MG PO TABS
10.0000 mg | ORAL_TABLET | Freq: Two times a day (BID) | ORAL | Status: DC
  Administered 2023-07-04 – 2023-07-09 (×10): 10 mg via ORAL
  Filled 2023-07-04 (×11): qty 1

## 2023-07-04 MED ORDER — SODIUM CHLORIDE 0.9 % IV SOLN
1.0000 g | Freq: Once | INTRAVENOUS | Status: AC
  Administered 2023-07-04: 1 g via INTRAVENOUS

## 2023-07-04 MED ORDER — ONDANSETRON HCL 4 MG/2ML IJ SOLN
4.0000 mg | Freq: Once | INTRAMUSCULAR | Status: AC
  Administered 2023-07-04: 4 mg via INTRAVENOUS
  Filled 2023-07-04: qty 2

## 2023-07-04 MED ORDER — HYDRALAZINE HCL 50 MG PO TABS
100.0000 mg | ORAL_TABLET | Freq: Three times a day (TID) | ORAL | Status: DC
  Administered 2023-07-04 – 2023-07-09 (×14): 100 mg via ORAL
  Filled 2023-07-04 (×14): qty 2

## 2023-07-04 MED ORDER — PLECANATIDE 3 MG PO TABS: 1.0000 | ORAL_TABLET | Freq: Every day | ORAL | Status: DC

## 2023-07-04 MED ORDER — ONDANSETRON HCL 4 MG/2ML IJ SOLN
4.0000 mg | Freq: Once | INTRAMUSCULAR | Status: AC
Start: 2023-07-04 — End: 2023-07-04
  Administered 2023-07-04: 4 mg via INTRAVENOUS
  Filled 2023-07-04: qty 2

## 2023-07-04 MED ORDER — METOCLOPRAMIDE HCL 5 MG/ML IJ SOLN
10.0000 mg | Freq: Once | INTRAMUSCULAR | Status: AC
Start: 2023-07-04 — End: 2023-07-04
  Administered 2023-07-04: 10 mg via INTRAVENOUS
  Filled 2023-07-04: qty 2

## 2023-07-04 MED ORDER — PILOCARPINE HCL 1 % OP SOLN
1.0000 [drp] | Freq: Two times a day (BID) | OPHTHALMIC | Status: DC
  Administered 2023-07-05 – 2023-07-09 (×9): 1 [drp] via OPHTHALMIC
  Filled 2023-07-04 (×2): qty 15

## 2023-07-04 MED ORDER — CARVEDILOL 12.5 MG PO TABS
12.5000 mg | ORAL_TABLET | Freq: Two times a day (BID) | ORAL | Status: DC
  Administered 2023-07-04 – 2023-07-09 (×10): 12.5 mg via ORAL
  Filled 2023-07-04 (×10): qty 1

## 2023-07-04 MED ORDER — SODIUM CHLORIDE 0.9 % IV SOLN
1.0000 g | Freq: Two times a day (BID) | INTRAVENOUS | Status: DC
  Administered 2023-07-04 – 2023-07-07 (×7): 1 g via INTRAVENOUS
  Filled 2023-07-04 (×8): qty 20

## 2023-07-04 MED ORDER — ONDANSETRON HCL 4 MG/2ML IJ SOLN
4.0000 mg | Freq: Four times a day (QID) | INTRAMUSCULAR | Status: DC | PRN
  Administered 2023-07-04 – 2023-07-07 (×2): 4 mg via INTRAVENOUS
  Filled 2023-07-04 (×2): qty 2

## 2023-07-04 MED ORDER — LEVOTHYROXINE SODIUM 112 MCG PO TABS
112.0000 ug | ORAL_TABLET | Freq: Every day | ORAL | Status: DC
  Administered 2023-07-05 – 2023-07-09 (×5): 112 ug via ORAL
  Filled 2023-07-04 (×5): qty 1

## 2023-07-04 MED ORDER — LATANOPROST 0.005 % OP SOLN
1.0000 [drp] | Freq: Every day | OPHTHALMIC | Status: DC
  Administered 2023-07-05 – 2023-07-08 (×4): 1 [drp] via OPHTHALMIC
  Filled 2023-07-04: qty 2.5

## 2023-07-04 MED ORDER — PRUCALOPRIDE SUCCINATE 2 MG PO TABS: 2.0000 mg | ORAL_TABLET | Freq: Every day | ORAL | Status: DC

## 2023-07-04 MED ORDER — POLYETHYLENE GLYCOL 3350 17 G PO PACK: 17.0000 g | PACK | Freq: Every day | ORAL | Status: DC | PRN

## 2023-07-04 MED ORDER — DILTIAZEM HCL ER COATED BEADS 240 MG PO CP24
360.0000 mg | ORAL_CAPSULE | Freq: Every day | ORAL | Status: DC
  Administered 2023-07-05 – 2023-07-09 (×5): 360 mg via ORAL
  Filled 2023-07-04 (×5): qty 1

## 2023-07-04 MED ORDER — ACETAMINOPHEN 325 MG PO TABS: 650.0000 mg | ORAL_TABLET | Freq: Four times a day (QID) | ORAL | Status: DC | PRN

## 2023-07-04 MED ORDER — IOHEXOL 300 MG/ML  SOLN
60.0000 mL | Freq: Once | INTRAMUSCULAR | Status: AC | PRN
  Administered 2023-07-04: 60 mL via INTRAVENOUS

## 2023-07-04 MED ORDER — AMIODARONE HCL 200 MG PO TABS
200.0000 mg | ORAL_TABLET | Freq: Every day | ORAL | Status: DC
  Administered 2023-07-05 – 2023-07-09 (×5): 200 mg via ORAL
  Filled 2023-07-04 (×6): qty 1

## 2023-07-04 MED ORDER — CILIDINIUM-CHLORDIAZEPOXIDE 2.5-5 MG PO CAPS: 2.0000 | ORAL_CAPSULE | Freq: Every day | ORAL | Status: DC | PRN

## 2023-07-04 MED ORDER — OXYCODONE HCL 5 MG PO TABS
5.0000 mg | ORAL_TABLET | ORAL | Status: DC | PRN
  Filled 2023-07-04: qty 1

## 2023-07-04 MED ORDER — PANTOPRAZOLE SODIUM 40 MG PO TBEC
40.0000 mg | DELAYED_RELEASE_TABLET | Freq: Every day | ORAL | Status: DC
  Administered 2023-07-05 – 2023-07-09 (×5): 40 mg via ORAL
  Filled 2023-07-04 (×5): qty 1

## 2023-07-04 MED ORDER — APIXABAN 5 MG PO TABS
5.0000 mg | ORAL_TABLET | Freq: Two times a day (BID) | ORAL | Status: DC
  Administered 2023-07-04 – 2023-07-09 (×10): 5 mg via ORAL
  Filled 2023-07-04 (×10): qty 1

## 2023-07-04 MED ORDER — ACETAMINOPHEN 325 MG PO TABS
650.0000 mg | ORAL_TABLET | Freq: Four times a day (QID) | ORAL | Status: DC | PRN
  Administered 2023-07-05 – 2023-07-09 (×8): 650 mg via ORAL
  Filled 2023-07-04 (×8): qty 2

## 2023-07-04 MED ORDER — ACETAMINOPHEN 650 MG RE SUPP: 650.0000 mg | Freq: Four times a day (QID) | RECTAL | Status: DC | PRN

## 2023-07-04 NOTE — H&P (Signed)
 History and Physical    Amy Hickman YNW:[id number] DOB: [birth year] DOA: 07/04/2023  PCP: Cheron Schaumann., MD   Patient coming from: Home   Chief Complaint: Suprapubic pain, low back pain, fevers   HPI: Amy Hickman is a 88 y.o. female with medical history significant for CAD, atrial fibrillation on Eliquis, chronic HFpEF, history of recurrent DVTs, interstitial cystitis, recurrent UTIs, hypertension, hypothyroidism, and chronic constipation who presents with suprapubic pain, low back pain, and fevers.  Patient reports that she has been treated with oral antibiotics, IV antibiotics as an outpatient, intravesicular gentamicin installations, and recent fosfomycin, continues to experience fevers, suprapubic pain, and that she attributes to UTI.   Urine culture from 06/23/2023 is growing ESBL Klebsiella.  [facility] ED Course: Upon arrival to the ED, patient is found to be afebrile and saturating mid 90s on room air with normal heart rate and stable blood pressure.  Labs are most notable for creatinine 1.30 and normal WBC.  CT demonstrates stool retention, colonic distention, gas in the urinary bladder.  Blood culture was collected and the patient was treated with morphine, Zofran, Reglan, and cefepime.  She was transferred to [facility] for admission.  Review of Systems:  All other systems reviewed and apart from HPI, are negative.  Past Medical History:  Diagnosis Date   Atrial fibrillation, chronic (HCC)    s/p multiple cardioversions with sustained NSR, on Eliquis   Celiac disease    Chronic systolic CHF (congestive heart failure) (HCC)    Coronary artery disease involving native coronary artery of native heart without angina pectoris 03/15/2021   GERD (gastroesophageal reflux disease)    HOH (hard of hearing)    Hyperparathyroidism (HCC) 12/12/2020   Hypertension    Interstitial cystitis     Past Surgical History:  Procedure Laterality Date   ABDOMINAL  HYSTERECTOMY     APPENDECTOMY     CARDIOVERSION N/A 04/24/2021   Procedure: CARDIOVERSION;  Surgeon: Jake Bathe, MD;  Location: [facility] ENDOSCOPY;  Service: Cardiovascular;  Laterality: N/A;   CARDIOVERSION N/A 06/12/2021   Procedure: CARDIOVERSION;  Surgeon: Jodelle Red, MD;  Location: [facility] ENDOSCOPY;  Service: Cardiovascular;  Laterality: N/A;   CARDIOVERSION N/A 08/11/2021   Procedure: CARDIOVERSION;  Surgeon: Meriam Sprague, MD;  Location: [facility] ENDOSCOPY;  Service: Cardiovascular;  Laterality: N/A;   CHOLECYSTECTOMY     ear drum surgery     REPLACEMENT TOTAL KNEE     ROTATOR CUFF REPAIR Right    TONSILLECTOMY      Social History:   reports that she quit smoking about 44 years ago. Her smoking use included cigarettes. She started smoking about 77 years ago. She has a 66 pack-year smoking history. She has never used smokeless tobacco. She reports that she does not currently use alcohol. She reports that she does not use drugs.  Allergies  Allergen Reactions   Cephalexin Other (See Comments)    Abdominal pain , GI distress, Abdominal pain , GI distress   Hydrocodone-Acetaminophen Other (See Comments) and Rash    Palpitations, flushing, hyperactivity, Chest pain, Chest pain   Ativan [Lorazepam] Other (See Comments)    "Knocked her out, woke up 3 days later"   Augmentin [Amoxicillin-Pot Clavulanate] Nausea And Vomiting   Avelox [Moxifloxacin] Itching and Nausea Only   Brimonidine Itching and Swelling    redness   Chlorzoxazone Other (See Comments)    Went crazy/loopy   Ciprofloxacin Hcl Itching and Nausea Only   Clavulanic Acid Other (See Comments)  Unknown   Crestor [Rosuvastatin] Other (See Comments)    myalgia   Ezetimibe Diarrhea    Stomach cramps   Gluten Meal Other (See Comments)    Diarrhea, hot/cold sweat, will sleep for a couple of hours - Celiac disease   Lansoprazole Other (See Comments)    GI intolerance   Nitrofuran Derivatives Itching and Nausea Only    Phenergan [Promethazine Hcl] Other (See Comments)    hallucinations   Wheat Other (See Comments)    Gi distress- celiac disease   Codeine Palpitations   Darvon [Propoxyphene Hcl] Palpitations   Doxycycline Itching and Nausea Only    Family History  Problem Relation Age of Onset   Liver cancer Sister    Bone cancer Sister    Bladder Cancer Sister    Brain cancer Sister    Lung cancer Sister    Heart disease Sister    Hypertension Sister    Pancreatic cancer Brother    Liver cancer Brother    Lung cancer Brother    Hypertension Other    Colon cancer Neg Hx    Colon polyps Neg Hx    Esophageal cancer Neg Hx    Rectal cancer Neg Hx    Stomach cancer Neg Hx      Prior to Admission medications   Medication Sig Start Date End Date Taking? Authorizing Provider  TRULANCE 3 MG TABS Take 1 tablet by mouth daily. 04/02/23  Yes [provider]  acetaminophen (TYLENOL) 500 MG tablet Take 1 tablet (500 mg total) by mouth every 6 (six) hours as needed for pain. Patient taking differently: Take 1,000 mg by mouth as needed for pain or moderate pain (pain score 4-6). 06/24/12   Gerhard Munch, MD  amiodarone (PACERONE) 200 MG tablet Take 1 tablet (200 mg total) by mouth daily. 11/04/22   Jodelle Red, MD  carvedilol (COREG) 12.5 MG tablet Take 1 tablet (12.5 mg total) by mouth 2 (two) times daily. 12/08/22   Jodelle Red, MD  cefdinir (OMNICEF) 300 MG capsule Take 1 capsule (300 mg total) by mouth daily. 10/19/22   Jeannie Fend, PA-C  clidinium-chlordiazePOXIDE (LIBRAX) 5-2.5 MG capsule Take 2 capsules by mouth daily as needed (cramping). 02/28/21   [provider]  CRANBERRY-VITAMIN C PO Take 1 capsule by mouth in the morning.    [provider]  diltiazem (CARDIZEM CD) 360 MG 24 hr capsule Take 1 capsule (360 mg total) by mouth daily. 06/12/21   Jodelle Red, MD  ELIQUIS 5 MG TABS tablet TAKE 1 TABLET TWICE A DAY 06/15/23   Jodelle Red, MD  esomeprazole (NEXIUM) 40 MG capsule Take 40 mg by mouth daily before breakfast.    [provider]  estradiol (ESTRACE) 0.1 MG/GM vaginal cream Place 1 Applicatorful vaginally.    [provider]  fenofibrate 54 MG tablet Take 54 mg by mouth daily. 07/15/22   [provider]  furosemide (LASIX) 40 MG tablet Take 1 tablet (40 mg total) by mouth daily. 04/08/23 07/07/23  Jodelle Red, MD  hydrALAZINE (APRESOLINE) 100 MG tablet TAKE 1 TABLET THREE TIMES A DAY 06/25/23   Jodelle Red, MD  Hyoscyamine Sulfate SL (OSCIMIN) 0.125 MG SUBL Take 0.125 mg by mouth as needed (cramping). 12/05/21   [provider]  isosorbide dinitrate (ISORDIL) 10 MG tablet Take 10 mg by mouth 2 (two) times daily. 12/31/21   [provider]  latanoprost (XALATAN) 0.005 % ophthalmic solution Place 1 drop into both eyes at bedtime.  08/31/22   [provider]  levothyroxine (SYNTHROID) 112 MCG tablet Take 112 mcg by mouth daily before breakfast. 07/24/22   [provider]  ondansetron (ZOFRAN) 4 MG tablet TAKE 1 TABLET EVERY 8 HOURS AS NEEDED FOR NAUSEA OR VOMITING Patient taking differently: Take 4 mg by mouth every 8 (eight) hours as needed for nausea or vomiting. 12/07/22   Imogene Burn, MD  pilocarpine (PILOCAR) 1 % ophthalmic solution Place 1 drop into the right eye 2 (two) times daily. 08/31/22   [provider]  polyethylene glycol (MIRALAX / GLYCOLAX) 17 g packet Take 17 g by mouth 2 (two) times daily. Patient taking differently: Take 68 g by mouth 2 (two) times daily. 07/13/22   Danford, Earl Lites, MD  polyvinyl alcohol (ARTIFICIAL TEARS) 1.4 % ophthalmic solution Place 1 drop into both eyes as needed for dry eyes.    [provider]  Prucalopride Succinate (MOTEGRITY) 2 MG TABS TAKE 1 TABLET(2 MG) BY MOUTH DAILY 04/08/23   Pyrtle, Carie Caddy, MD  Vitamin D, Ergocalciferol, (DRISDOL) 1.25 MG (50000 UNIT) CAPS capsule  Take 50,000 Units by mouth every 7 (seven) days. 05/22/21   [provider]    Physical Exam: Vitals:   07/04/23 1340 07/04/23 1436 07/04/23 1555 07/04/23 1813  BP: (!) 168/80 (!) 178/90 (!) 164/81 (!) 153/64  Pulse: 63 65 60 (!) 59  Resp: 16 18 17 16   Temp:   97.7 F (36.5 C) (!) 97.5 F (36.4 C)  TempSrc:   Oral Oral  SpO2: 94% 95% 94% 93%  Weight:      Height:        Constitutional: NAD, no pallor or diaphoresis   Eyes: PERTLA, lids and conjunctivae normal ENMT: Mucous membranes are moist. Posterior pharynx clear of any exudate or lesions.   Neck: supple, no masses  Respiratory: no wheezing, no crackles. No accessory muscle use.  Cardiovascular: S1 & S2 heard, regular rate and rhythm. No extremity edema.  Abdomen: Suprapubic tenderness, soft, no guarding. Bowel sounds active.  Musculoskeletal: no clubbing / cyanosis. No joint deformity upper and lower extremities.   Skin: no significant rashes, lesions, ulcers. Warm, dry, well-perfused. Neurologic: CN 2-12 grossly intact. Moving all extremities. Alert and oriented.  Psychiatric: Calm. Cooperative.    Labs and Imaging on Admission: I have personally reviewed following labs and imaging studies  CBC: Recent Labs  Lab 07/04/23 1003  WBC 6.3  NEUTROABS 4.4  HGB 11.1*  HCT 34.4*  MCV 94.2  PLT 278   Basic Metabolic Panel: Recent Labs  Lab 07/04/23 1003  NA 133*  K 4.3  CL 102  CO2 23  GLUCOSE 94  BUN 29*  CREATININE 1.30*  CALCIUM 9.3   GFR: Estimated Creatinine Clearance: 26.2 mL/min (A) (by C-G formula based on SCr of 1.3 mg/dL (H)). Liver Function Tests: No results for input(s): "AST", "ALT", "ALKPHOS", "BILITOT", "PROT", "ALBUMIN" in the last 168 hours. No results for input(s): "LIPASE", "AMYLASE" in the last 168 hours. No results for input(s): "AMMONIA" in the last 168 hours. Coagulation Profile: No results for input(s): "INR", "PROTIME" in the last 168 hours. Cardiac Enzymes: No results for  input(s): "CKTOTAL", "CKMB", "CKMBINDEX", "TROPONINI" in the last 168 hours. BNP (last 3 results) No results for input(s): "PROBNP" in the last 8760 hours. HbA1C: No results for input(s): "HGBA1C" in the last 72 hours. CBG: No results for input(s): "GLUCAP" in the last 168 hours. Lipid Profile: No results for input(s): "CHOL", "HDL", "LDLCALC", "TRIG", "CHOLHDL", "  LDLDIRECT" in the last 72 hours. Thyroid Function Tests: No results for input(s): "TSH", "T4TOTAL", "FREET4", "T3FREE", "THYROIDAB" in the last 72 hours. Anemia Panel: No results for input(s): "VITAMINB12", "FOLATE", "FERRITIN", "TIBC", "IRON", "RETICCTPCT" in the last 72 hours. Urine analysis:    Component Value Date/Time   COLORURINE YELLOW 07/04/2023 1003   APPEARANCEUR HAZY (A) 07/04/2023 1003   LABSPEC 1.020 07/04/2023 1003   PHURINE 7.0 07/04/2023 1003   GLUCOSEU NEGATIVE 07/04/2023 1003   HGBUR NEGATIVE 07/04/2023 1003   BILIRUBINUR NEGATIVE 07/04/2023 1003   KETONESUR NEGATIVE 07/04/2023 1003   PROTEINUR 100 (A) 07/04/2023 1003   UROBILINOGEN 0.2 03/27/2014 2129   NITRITE NEGATIVE 07/04/2023 1003   LEUKOCYTESUR NEGATIVE 07/04/2023 1003   Sepsis Labs: @LABRCNTIP (procalcitonin:4,lacticidven:4) )No results found for this or any previous visit (from the past 240 hours).   Radiological Exams on Admission: CT ABDOMEN PELVIS W CONTRAST Result Date: 07/04/2023 CLINICAL DATA:  Abdominal/flank pain with stone suspected EXAM: CT ABDOMEN AND PELVIS WITH CONTRAST TECHNIQUE: Multidetector CT imaging of the abdomen and pelvis was performed using the standard protocol following bolus administration of intravenous contrast. RADIATION DOSE REDUCTION: This exam was performed according to the departmental dose-optimization program which includes automated exposure control, adjustment of the mA and/or kV according to patient size and/or use of iterative reconstruction technique. CONTRAST:  60mL OMNIPAQUE IOHEXOL 300 MG/ML  SOLN  COMPARISON:  Eleven days prior FINDINGS: Lower chest: Cardiomegaly. Coronary and aortic atherosclerosis. Suspect prior anti reflux procedure with clips around the esophageal hiatus. Hepatobiliary: Low density at the portal triads, circumferential and favored periportal edema. This was also seen on prior. There is cholecystectomy without common bile duct dilatation.No focal liver lesion. Pancreas: No acute finding. Spleen: Unremarkable. Adrenals/Urinary Tract: Negative adrenals. No hydronephrosis or stone. Relative right renal atrophy. Air within the urinary bladder which is pronounced in volume, no fistula seen or air on CT from last year. Stomach/Bowel: Diffuse stool distended colon but sparing the rectum, pattern similar to before. No small bowel obstruction or inflammation is seen. There is an anastomosis in the left abdomen involving small bowel. Vascular/Lymphatic: Extensive atheromatous calcification of the aorta and branch vessels. There is an IVC filter in stable position with legs extending beyond the caval wall. No mass or adenopathy. Reproductive:Hysterectomy Other: No ascites or pneumoperitoneum. Musculoskeletal: Advanced lumbar spine degeneration with scoliosis. Advanced bilateral hip osteoarthritis. No acute finding. IMPRESSION: 1. Continued generalized stool retention and colonic distension. 2. Continued prominent volume of bladder gas, correlate with urinalysis. 3. Chronic findings as described. Electronically Signed   By: Tiburcio Pea M.D.   On: 07/04/2023 11:59    Assessment/Plan   1. ESBL UTI  - Treat with meropenem, follow clinical course, consider ID consultation    2. Atrial fibrillation  - Amiodarone, Coreg, diltiazem, Eliquis   3. Chronic HFpEF  - Appears compensated   4. CAD  - No anginal complaints    5. Hypertension  - Hydralazine, Coreg, diltiazem   6. CKD 3B  - Appears close to baseline  - Renally-dose medications   7. Hypothyroidism  - Synthroid   8.  Chronic constipation  - Continue Trulance, Motegrity    DVT prophylaxis: Eliquis  Code Status: Full  Level of Care: Level of care: Med-Surg Family Communication: Granddaughter/caretaker at bedside  Disposition Plan:  Patient is from: home  Anticipated d/c is to: Home  Anticipated d/c date is: 07/08/23  Patient currently: Pending treatment of ESBL infection  Consults called: None  Admission status: inpatient  Briscoe Deutscher, MD [facility]  07/04/2023, 7:48 PM

## 2023-07-04 NOTE — ED Notes (Signed)
Verbal consent given.

## 2023-07-04 NOTE — ED Provider Notes (Signed)
 [facility] Provider Note  CSN: [id number] Arrival date & time: 07/04/23 3474  Chief Complaint(s) Abdominal Pain  HPI Amy Hickman is a 88 y.o. female here today for suprapubic pain, back pain, intermittent fever.  Patient has a history of recurrent Klebsiella UTI.  She has followed with [facility] for this previously.  Patient has even had intravesicular injections to try to treat this infection without success.  Patient recently received a dose of fosfomycin, but has continued to feel worse.  She followed up with her urologist 2 days ago, continued to have bacteria in her urine, and felt more fatigued.  She now has back pain.     Past Medical History Past Medical History:  Diagnosis Date   Atrial fibrillation, chronic (HCC)    s/p multiple cardioversions with sustained NSR, on Eliquis   Celiac disease    Chronic systolic CHF (congestive heart failure) (HCC)    Coronary artery disease involving native coronary artery of native heart without angina pectoris 03/15/2021   GERD (gastroesophageal reflux disease)    HOH (hard of hearing)    Hyperparathyroidism (HCC) 12/12/2020   Hypertension    Interstitial cystitis    Patient Active Problem List   Diagnosis Date Noted   Urinary tract infection due to ESBL Klebsiella 07/04/2023   Abnormal CT scan, colon 07/18/2022   AKI (acute kidney injury) (HCC) 07/17/2022   Hyperkalemia 07/17/2022   COPD (chronic obstructive pulmonary disease) (HCC) 07/13/2022   Constipation 07/12/2022   Atrial fibrillation (HCC) 03/20/2022   Fracture of right ulna 03/18/2022   Atrial fibrillation with rapid ventricular response (HCC) 03/17/2022   Elevated LFTs 02/05/2022   Malnutrition of moderate degree 12/24/2021   Chest pain 12/23/2021   Atrial fibrillation, chronic (HCC) 12/23/2021   CKD (chronic kidney disease) stage 4, GFR 15-29 ml/min (HCC) 12/23/2021   DNR (do not resuscitate) 12/23/2021   Celiac  disease 12/23/2021   Interstitial cystitis 12/23/2021   Secondary hypercoagulable state (HCC) 08/11/2021   Atrial flutter (HCC)    NICM (nonischemic cardiomyopathy) (HCC) 05/30/2021   Venous insufficiency 05/30/2021   Persistent atrial fibrillation (HCC) 05/09/2021   Chronic systolic CHF (congestive heart failure) (HCC) 03/17/2021   Biatrial enlargement 03/17/2021   Sepsis (HCC) 03/16/2021   GERD (gastroesophageal reflux disease)    History of DVT (deep vein thrombosis)    Influenza A 03/15/2021   Anemia 03/15/2021   Atrial fibrillation with RVR (HCC) 03/15/2021   COPD with acute exacerbation (HCC) 03/15/2021   Dark stools 03/15/2021   Hyponatremia 03/15/2021   Hypothyroidism 03/15/2021   Coronary artery disease involving native coronary artery of native heart without angina pectoris 03/15/2021   Essential hypertension 03/15/2021   Hyperlipidemia 03/15/2021   Hyperparathyroidism (HCC) 12/12/2020   Depression 01/03/2019   History of pericarditis 08/06/2016   Generalized osteoarthritis of multiple sites 08/19/2015   Age-related osteoporosis without current pathological fracture 08/14/2015   DVT (deep venous thrombosis) (HCC) 08/14/2015   History of GI bleed 06/15/2014   Home Medication(s) Prior to Admission medications   Medication Sig Start Date End Date Taking? Authorizing Provider  acetaminophen (TYLENOL) 500 MG tablet Take 1 tablet (500 mg total) by mouth every 6 (six) hours as needed for pain. Patient taking differently: Take 1,000 mg by mouth as needed for pain or moderate pain (pain score 4-6). 06/24/12   Gerhard Munch, MD  amiodarone (PACERONE) 200 MG tablet Take 1 tablet (200 mg total) by mouth daily. 11/04/22   Cristal Deer,  Bridgette, MD  carvedilol (COREG) 12.5 MG tablet Take 1 tablet (12.5 mg total) by mouth 2 (two) times daily. 12/08/22   Jodelle Red, MD  cefdinir (OMNICEF) 300 MG capsule Take 1 capsule (300 mg total) by mouth daily. 10/19/22   Jeannie Fend,  PA-C  clidinium-chlordiazePOXIDE (LIBRAX) 5-2.5 MG capsule Take 2 capsules by mouth daily as needed (cramping). 02/28/21   [provider]  CRANBERRY-VITAMIN C PO Take 1 capsule by mouth in the morning.    [provider]  diltiazem (CARDIZEM CD) 360 MG 24 hr capsule Take 1 capsule (360 mg total) by mouth daily. 06/12/21   Jodelle Red, MD  ELIQUIS 5 MG TABS tablet TAKE 1 TABLET TWICE A DAY 06/15/23   Jodelle Red, MD  esomeprazole (NEXIUM) 40 MG capsule Take 40 mg by mouth daily before breakfast.    [provider]  fenofibrate 54 MG tablet Take 54 mg by mouth daily. 07/15/22   [provider]  furosemide (LASIX) 40 MG tablet Take 1 tablet (40 mg total) by mouth daily. 04/08/23 07/07/23  Jodelle Red, MD  hydrALAZINE (APRESOLINE) 100 MG tablet TAKE 1 TABLET THREE TIMES A DAY 06/25/23   Jodelle Red, MD  Hyoscyamine Sulfate SL (OSCIMIN) 0.125 MG SUBL Take 0.125 mg by mouth as needed (cramping). 12/05/21   [provider]  isosorbide dinitrate (ISORDIL) 10 MG tablet Take 10 mg by mouth 2 (two) times daily. 12/31/21   [provider]  latanoprost (XALATAN) 0.005 % ophthalmic solution Place 1 drop into both eyes at bedtime. 08/31/22   [provider]  levothyroxine (SYNTHROID) 112 MCG tablet Take 112 mcg by mouth daily before breakfast. 07/24/22   [provider]  ondansetron (ZOFRAN) 4 MG tablet TAKE 1 TABLET EVERY 8 HOURS AS NEEDED FOR NAUSEA OR VOMITING 12/07/22   Imogene Burn, MD  pilocarpine (PILOCAR) 1 % ophthalmic solution Place 1 drop into the right eye 2 (two) times daily. 08/31/22   [provider]  polyethylene glycol (MIRALAX / GLYCOLAX) 17 g packet Take 17 g by mouth 2 (two) times daily. Patient taking differently: Take 68 g by mouth 2 (two) times daily. 07/13/22   Danford, Earl Lites, MD  polyvinyl alcohol (ARTIFICIAL TEARS) 1.4 % ophthalmic solution Place 1 drop into both eyes  as needed for dry eyes.    [provider]  Prucalopride Succinate (MOTEGRITY) 2 MG TABS TAKE 1 TABLET(2 MG) BY MOUTH DAILY 04/08/23   Pyrtle, Carie Caddy, MD  Vitamin D, Ergocalciferol, (DRISDOL) 1.25 MG (50000 UNIT) CAPS capsule Take 50,000 Units by mouth every 7 (seven) days. 05/22/21   [provider]                                                                                                                                    Past Surgical History Past Surgical History:  Procedure Laterality Date   ABDOMINAL HYSTERECTOMY  APPENDECTOMY     CARDIOVERSION N/A 04/24/2021   Procedure: CARDIOVERSION;  Surgeon: Jake Bathe, MD;  Location[facility] ENDOSCOPY;  Service: Cardiovascular;  Laterality: N/A;   CARDIOVERSION N/A 06/12/2021   Procedure: CARDIOVERSION;  Surgeon: Jodelle Red, MD;  Location: [facility] ENDOSCOPY;  Service: Cardiovascular;  Laterality: N/A;   CARDIOVERSION N/A 08/11/2021   Procedure: CARDIOVERSION;  Surgeon: Meriam Sprague, MD;  Location: [facility] ENDOSCOPY;  Service: Cardiovascular;  Laterality: N/A;   CHOLECYSTECTOMY     ear drum surgery     REPLACEMENT TOTAL KNEE     ROTATOR CUFF REPAIR Right    TONSILLECTOMY     Family History Family History  Problem Relation Age of Onset   Liver cancer Sister    Bone cancer Sister    Bladder Cancer Sister    Brain cancer Sister    Lung cancer Sister    Heart disease Sister    Hypertension Sister    Pancreatic cancer Brother    Liver cancer Brother    Lung cancer Brother    Hypertension Other    Colon cancer Neg Hx    Colon polyps Neg Hx    Esophageal cancer Neg Hx    Rectal cancer Neg Hx    Stomach cancer Neg Hx     Social History Social History   Tobacco Use   Smoking status: Former    Current packs/day: 0.00    Average packs/day: 2.0 packs/day for 33.0 years (66.0 ttl pk-yrs)    Types: Cigarettes    Start date: 68    Quit date: 57    Years since quitting: 44.2   Smokeless tobacco: Never   Vaping Use   Vaping status: Never Used  Substance Use Topics   Alcohol use: Not Currently    Comment: rare   Drug use: No   Allergies Cephalexin, Hydrocodone-acetaminophen, Ativan [lorazepam], Augmentin [amoxicillin-pot clavulanate], Avelox [moxifloxacin], Brimonidine, Chlorzoxazone, Ciprofloxacin hcl, Clavulanic acid, Crestor [rosuvastatin], Ezetimibe, Gluten meal, Lansoprazole, Nitrofuran derivatives, Phenergan [promethazine hcl], Wheat, Codeine, Darvon [propoxyphene hcl], and Doxycycline  Review of Systems Review of Systems  Physical Exam Vital Signs  I have reviewed the triage vital signs BP 123/73 (BP Location: Right Arm)   Pulse 69   Temp 98.3 F (36.8 C)   Resp 18   Ht 5\' 3"  (1.6 m)   Wt 60.3 kg   SpO2 97%   BMI 23.55 kg/m   Physical Exam Vitals reviewed.  Constitutional:      Appearance: She is not toxic-appearing.  Cardiovascular:     Rate and Rhythm: Normal rate.  Abdominal:     Palpations: Abdomen is soft.     Tenderness: There is no abdominal tenderness.  Skin:    General: Skin is warm.  Neurological:     General: No focal deficit present.     Mental Status: She is alert.     ED Results and Treatments Labs (all labs ordered are listed, but only abnormal results are displayed) Labs Reviewed  CBC WITH DIFFERENTIAL/PLATELET - Abnormal; Notable for the following components:      Result Value   RBC 3.65 (*)    Hemoglobin 11.1 (*)    HCT 34.4 (*)    RDW 16.0 (*)    All other components within normal limits  BASIC METABOLIC PANEL - Abnormal; Notable for the following components:   Sodium 133 (*)    BUN 29 (*)    Creatinine, Ser 1.30 (*)    GFR, Estimated 40 (*)  All other components within normal limits  URINALYSIS, ROUTINE W REFLEX MICROSCOPIC - Abnormal; Notable for the following components:   APPearance HAZY (*)    Protein, ur 100 (*)    All other components within normal limits  URINALYSIS, MICROSCOPIC (REFLEX) - Abnormal; Notable for the  following components:   Bacteria, UA RARE (*)    All other components within normal limits  CULTURE, BLOOD (ROUTINE X 2)  CULTURE, BLOOD (ROUTINE X 2)                                                                                                                          Radiology CT ABDOMEN PELVIS W CONTRAST Result Date: 07/04/2023 CLINICAL DATA:  Abdominal/flank pain with stone suspected EXAM: CT ABDOMEN AND PELVIS WITH CONTRAST TECHNIQUE: Multidetector CT imaging of the abdomen and pelvis was performed using the standard protocol following bolus administration of intravenous contrast. RADIATION DOSE REDUCTION: This exam was performed according to the departmental dose-optimization program which includes automated exposure control, adjustment of the mA and/or kV according to patient size and/or use of iterative reconstruction technique. CONTRAST:  60mL OMNIPAQUE IOHEXOL 300 MG/ML  SOLN COMPARISON:  Eleven days prior FINDINGS: Lower chest: Cardiomegaly. Coronary and aortic atherosclerosis. Suspect prior anti reflux procedure with clips around the esophageal hiatus. Hepatobiliary: Low density at the portal triads, circumferential and favored periportal edema. This was also seen on prior. There is cholecystectomy without common bile duct dilatation.No focal liver lesion. Pancreas: No acute finding. Spleen: Unremarkable. Adrenals/Urinary Tract: Negative adrenals. No hydronephrosis or stone. Relative right renal atrophy. Air within the urinary bladder which is pronounced in volume, no fistula seen or air on CT from last year. Stomach/Bowel: Diffuse stool distended colon but sparing the rectum, pattern similar to before. No small bowel obstruction or inflammation is seen. There is an anastomosis in the left abdomen involving small bowel. Vascular/Lymphatic: Extensive atheromatous calcification of the aorta and branch vessels. There is an IVC filter in stable position with legs extending beyond the caval wall.  No mass or adenopathy. Reproductive:Hysterectomy Other: No ascites or pneumoperitoneum. Musculoskeletal: Advanced lumbar spine degeneration with scoliosis. Advanced bilateral hip osteoarthritis. No acute finding. IMPRESSION: 1. Continued generalized stool retention and colonic distension. 2. Continued prominent volume of bladder gas, correlate with urinalysis. 3. Chronic findings as described. Electronically Signed   By: Tiburcio Pea M.D.   On: 07/04/2023 11:59    Pertinent labs & imaging results that were available during my care of the patient were reviewed by me and considered in my medical decision making (see MDM for details).  Medications Ordered in ED Medications  ceFEPIme (MAXIPIME) 1 g in sodium chloride 0.9 % 100 mL IVPB (1 g Intravenous New Bag/Given 07/04/23 1019)  morphine (PF) 4 MG/ML injection 4 mg (4 mg Intravenous Given 07/04/23 1054)  ondansetron (ZOFRAN) injection 4 mg (4 mg Intravenous Given 07/04/23 1053)  iohexol (OMNIPAQUE) 300 MG/ML solution 60 mL (60 mLs Intravenous Contrast Given 07/04/23 1104)  Procedures Procedures  (including critical care time)  Medical Decision Making / ED Course   This patient presents to the ED for concern of weakness, flank pain, this involves an extensive number of treatment options, and is a complaint that carries with it a high risk of complications and morbidity.  The differential diagnosis includes pill nephritis, cystitis, less likely sepsis.  MDM: On exam, patient appears frail, weak.  She been having back pain, fevers.  She has a known Klebsiella UTI.  I reviewed her previous urology note, urine cultures.  Sensitive to cefepime.  Patient has now failed multiple outpatient therapies.  With her advanced age, symptoms, believe IV antibiotics and likely admission is warranted.   Reassessment 12:50 PM-CT  imaging negative for additional process within the abdomen pelvis, no obvious pyelonephritis, patient does have gas in her urine which is consistent with a Klebsiella cystitis.  Given the patient's increasing weakness, and pain in the back, believe IV antibiotics and admission is warranted.  Spoke with hospitalist Dr. Satira Sark who agreed.  Will admit to hospitalist.  Additional history obtained: -Additional history obtained from family at bedside -External records from outside source obtained and reviewed including: Chart review including previous notes, labs, imaging, consultation notes   Lab Tests: -I ordered, reviewed, and interpreted labs.   The pertinent results include:   Labs Reviewed  CBC WITH DIFFERENTIAL/PLATELET - Abnormal; Notable for the following components:      Result Value   RBC 3.65 (*)    Hemoglobin 11.1 (*)    HCT 34.4 (*)    RDW 16.0 (*)    All other components within normal limits  BASIC METABOLIC PANEL - Abnormal; Notable for the following components:   Sodium 133 (*)    BUN 29 (*)    Creatinine, Ser 1.30 (*)    GFR, Estimated 40 (*)    All other components within normal limits  URINALYSIS, ROUTINE W REFLEX MICROSCOPIC - Abnormal; Notable for the following components:   APPearance HAZY (*)    Protein, ur 100 (*)    All other components within normal limits  URINALYSIS, MICROSCOPIC (REFLEX) - Abnormal; Notable for the following components:   Bacteria, UA RARE (*)    All other components within normal limits  CULTURE, BLOOD (ROUTINE X 2)  CULTURE, BLOOD (ROUTINE X 2)      EKG   EKG Interpretation Date/Time:    Ventricular Rate:    PR Interval:    QRS Duration:    QT Interval:    QTC Calculation:   R Axis:      Text Interpretation:           Imaging Studies ordered: I ordered imaging studies including CT imaging the abdomen pelvis I independently visualized and interpreted imaging. I agree with the radiologist interpretation   Medicines  ordered and prescription drug management: Meds ordered this encounter  Medications   ceFEPIme (MAXIPIME) 1 g in sodium chloride 0.9 % 100 mL IVPB    Antibiotic Indication::   UTI   morphine (PF) 4 MG/ML injection 4 mg   ondansetron (ZOFRAN) injection 4 mg   iohexol (OMNIPAQUE) 300 MG/ML solution 60 mL    -I have reviewed the patients home medicines and have made adjustments as needed  Social Determinants of Health:  Factors impacting patients care include:    Reevaluation: After the interventions noted above, I reevaluated the patient and found that they have :improved  Co morbidities that complicate the patient evaluation  Past Medical History:  Diagnosis Date   Atrial fibrillation, chronic (HCC)    s/p multiple cardioversions with sustained NSR, on Eliquis   Celiac disease    Chronic systolic CHF (congestive heart failure) (HCC)    Coronary artery disease involving native coronary artery of native heart without angina pectoris 03/15/2021   GERD (gastroesophageal reflux disease)    HOH (hard of hearing)    Hyperparathyroidism (HCC) 12/12/2020   Hypertension    Interstitial cystitis       Dispostion: Admission     Final Clinical Impression(s) / ED Diagnoses Final diagnoses:  Cystitis  Pyelonephritis     @PCDICTATION @    Anders Simmonds T, DO 07/04/23 1253

## 2023-07-04 NOTE — ED Notes (Signed)
Attempted report at this time. RN to call back  

## 2023-07-04 NOTE — ED Triage Notes (Signed)
 Abd pain, back pain fever on and off, jst got results od ua  and she has increased bacteria in urine

## 2023-07-04 NOTE — ED Notes (Signed)
Report given to Tommy RN with [facility]  ?

## 2023-07-04 NOTE — ED Notes (Signed)
 Assisted to [facility] with w/c. Family member at bedside . Informed of room assignment at [facility]

## 2023-07-04 NOTE — ED Notes (Signed)
Report given to [facility] receiving nurse at [facility]

## 2023-07-04 NOTE — ED Notes (Signed)
 Called [facility] for transport.

## 2023-07-04 NOTE — Plan of Care (Signed)
   Problem: Health Behavior/Discharge Planning: Goal: Ability to manage health-related needs will improve Outcome: Progressing   Problem: Clinical Measurements: Goal: Ability to maintain clinical measurements within normal limits will improve Outcome: Progressing Goal: Diagnostic test results will improve Outcome: Progressing

## 2023-07-04 NOTE — Progress Notes (Signed)
 Pharmacy Antibiotic Note  Amy Hickman is a 88 y.o. female admitted on 07/04/2023 with suprapubic pain, back pain, and intermittent fever. Patient with history of recurrent Klebsiella UTIs, followed by [facility]. Patient was seen in the ED 3/5 with abdominal pain, given a dose of fosfomycin and discharged. Urine culture from that visit with ESBL K.pneumo. Pharmacy has been consulted for meropenem dosing.  Plan: -Meropenem 1 g IV q12h based on current renal function -Continue to follow renal function, cultures and clinical progress for dose adjustments and de-escalation as indicated  Height: 5\' 3"  (160 cm) Weight: 60.3 kg (132 lb 15 oz) IBW/kg (Calculated) : 52.4  Temp (24hrs), Avg:97.9 F (36.6 C), Min:97.7 F (36.5 C), Max:98.3 F (36.8 C)  Recent Labs  Lab 07/04/23 1003  WBC 6.3  CREATININE 1.30*    Estimated Creatinine Clearance: 26.2 mL/min (A) (by C-G formula based on SCr of 1.3 mg/dL (H)).    Allergies  Allergen Reactions   Cephalexin Other (See Comments)    Abdominal pain , GI distress, Abdominal pain , GI distress   Hydrocodone-Acetaminophen Other (See Comments) and Rash    Palpitations, flushing, hyperactivity, Chest pain, Chest pain   Ativan [Lorazepam] Other (See Comments)    "Knocked her out, woke up 3 days later"   Augmentin [Amoxicillin-Pot Clavulanate] Nausea And Vomiting   Avelox [Moxifloxacin] Itching and Nausea Only   Brimonidine Itching and Swelling    redness   Chlorzoxazone Other (See Comments)    Went crazy/loopy   Ciprofloxacin Hcl Itching and Nausea Only   Clavulanic Acid Other (See Comments)     Unknown   Crestor [Rosuvastatin] Other (See Comments)    myalgia   Ezetimibe Diarrhea    Stomach cramps   Gluten Meal Other (See Comments)    Diarrhea, hot/cold sweat, will sleep for a couple of hours - Celiac disease   Lansoprazole Other (See Comments)    GI intolerance   Nitrofuran Derivatives Itching and Nausea Only   Phenergan  [Promethazine Hcl] Other (See Comments)    hallucinations   Wheat Other (See Comments)    Gi distress- celiac disease   Codeine Palpitations   Darvon [Propoxyphene Hcl] Palpitations   Doxycycline Itching and Nausea Only    Antimicrobials this admission: Meropenem 3/16 >> Cefepime 3/16 x 1  Dose adjustments this admission: NA  Microbiology results: 3/16 BCx: pending  From ED visit 06/23/23: UCx: >100k ESBL K.pneumo (R-amp, cefazolin, ceftriaxone, cipro, bactrim)    Thank you for allowing pharmacy to be a part of this patient's care.  Pricilla Riffle, PharmD, BCPS Clinical Pharmacist 07/04/2023 5:00 PM

## 2023-07-05 DIAGNOSIS — N390 Urinary tract infection, site not specified: Secondary | ICD-10-CM | POA: Diagnosis not present

## 2023-07-05 DIAGNOSIS — B9689 Other specified bacterial agents as the cause of diseases classified elsewhere: Secondary | ICD-10-CM | POA: Diagnosis not present

## 2023-07-05 LAB — CBC
HCT: 33.4 % — ABNORMAL LOW (ref 36.0–46.0)
Hemoglobin: 10.3 g/dL — ABNORMAL LOW (ref 12.0–15.0)
MCH: 30.4 pg (ref 26.0–34.0)
MCHC: 30.8 g/dL (ref 30.0–36.0)
MCV: 98.5 fL (ref 80.0–100.0)
Platelets: 235 10*3/uL (ref 150–400)
RBC: 3.39 MIL/uL — ABNORMAL LOW (ref 3.87–5.11)
RDW: 16 % — ABNORMAL HIGH (ref 11.5–15.5)
WBC: 5.2 10*3/uL (ref 4.0–10.5)
nRBC: 0 % (ref 0.0–0.2)

## 2023-07-05 LAB — BASIC METABOLIC PANEL
Anion gap: 6 (ref 5–15)
BUN: 24 mg/dL — ABNORMAL HIGH (ref 8–23)
CO2: 24 mmol/L (ref 22–32)
Calcium: 8.7 mg/dL — ABNORMAL LOW (ref 8.9–10.3)
Chloride: 103 mmol/L (ref 98–111)
Creatinine, Ser: 1.24 mg/dL — ABNORMAL HIGH (ref 0.44–1.00)
GFR, Estimated: 43 mL/min — ABNORMAL LOW (ref >60)
Glucose, Bld: 93 mg/dL (ref 70–99)
Potassium: 4.6 mmol/L (ref 3.5–5.1)
Sodium: 133 mmol/L — ABNORMAL LOW (ref 135–145)

## 2023-07-05 MED ORDER — POLYETHYLENE GLYCOL 3350 17 G PO PACK
17.0000 g | PACK | Freq: Two times a day (BID) | ORAL | Status: DC
  Administered 2023-07-05 (×2): 17 g via ORAL
  Filled 2023-07-05 (×2): qty 1

## 2023-07-05 NOTE — Progress Notes (Signed)
 PROGRESS NOTE  Amy Hickman    DOB: Apr 08, 1936, 88 y.o.  WUJ:[id number]    Code Status: Full Code   DOA: 07/04/2023   LOS: 1   Brief hospital course  Amy Hickman is a 88 y.o. female with medical history significant for CAD, atrial fibrillation on Eliquis, chronic HFpEF, history of recurrent DVTs, interstitial cystitis, recurrent UTIs, hypertension, hypothyroidism, and chronic constipation who presents with suprapubic pain, low back pain, and fevers.   Patient reports that she has been treated with oral antibiotics, IV antibiotics as an outpatient, intravesicular gentamicin installations, and recent fosfomycin, continues to experience fevers, suprapubic pain, and that she attributes to UTI.  Urine culture from 06/23/2023 is growing ESBL Klebsiella.   [facility] ED Course: afebrile and saturating mid 90s on room air with normal heart rate and stable blood pressure.  Labs are most notable for creatinine 1.30 and normal WBC.  CT demonstrates stool retention, colonic distention, gas in the urinary bladder.   Blood culture was collected and the patient was treated with morphine, Zofran, Reglan, and cefepime.  She was transferred to [facility] for admission.  07/05/23 -improved.   Assessment & Plan  Principal Problem:   Urinary tract infection due to ESBL Klebsiella Active Problems:   Chronic heart failure with mildly reduced ejection fraction (HFmrEF, 41-49%) (HCC)   Hypothyroidism   Coronary artery disease involving native coronary artery of native heart without angina pectoris   Persistent atrial fibrillation (HCC)   COPD (chronic obstructive pulmonary disease) (HCC)   CKD stage 3b, GFR 30-44 ml/min (HCC)   ESBL UTI  - Treat with meropenem, follow clinical course, consider ID consultation   - recommend topical estrogen replacement to reduce compounding contributions to discomfort and recurrent UTIs   Atrial fibrillation  - Amiodarone, Coreg, diltiazem, Eliquis    Chronic  HFpEF  - Appears compensated    CAD - No anginal complaints     Hypertension  - Hydralazine, Coreg, diltiazem    CKD 3B  - Appears close to baseline  - Renally-dose medications    Hypothyroidism  - Synthroid    Chronic constipation  - Continue Trulance,  - add miralax - discontinue opioids  Body mass index is 23.55 kg/m.  VTE ppx:  apixaban (ELIQUIS) tablet 5 mg   Diet:     Diet   Diet regular Fluid consistency: Thin   Consultants: None   Subjective 07/05/23    Pt reports feeling improved. Mild burning with urination which is improved. No SOB. Abdomen is mildly distended but non-tender. Last BM yesterday morning. Wants oxycodone to be discontinued.    Objective   Vitals:   07/04/23 1813 07/04/23 2203 07/05/23 0224 07/05/23 0531  BP: (!) 153/64 133/67 (!) 142/67 137/62  Pulse: (!) 59 (!) 59 60 60  Resp: 16 18 18 18   Temp: (!) 97.5 F (36.4 C) 98.3 F (36.8 C) 97.8 F (36.6 C) 98.4 F (36.9 C)  TempSrc: Oral Oral Oral   SpO2: 93% 93% 94% 92%  Weight:      Height:        Intake/Output Summary (Last 24 hours) at 07/05/2023 9562 Last data filed at 07/05/2023 0531 Gross per 24 hour  Intake 340 ml  Output 800 ml  Net -460 ml   Filed Weights   07/04/23 0948  Weight: 60.3 kg     Physical Exam:  General: awake, alert, NAD HEENT: atraumatic, clear conjunctiva, anicteric sclera, MMM, hearing grossly normal Respiratory: normal respiratory effort. Cardiovascular:  quick capillary refill, normal S1/S2, RRR, no JVD, murmurs Gastrointestinal: soft, NT, ND Nervous: A&O x3. no gross focal neurologic deficits, normal speech Extremities: moves all equally, no edema, normal tone Skin: dry, intact, normal temperature, normal color. No rashes, lesions or ulcers on exposed skin Psychiatry: normal mood, congruent affect  Labs   I have personally reviewed the following labs and imaging studies CBC    Component Value Date/Time   WBC 5.2 07/05/2023 0406   RBC 3.39  (L) 07/05/2023 0406   HGB 10.3 (L) 07/05/2023 0406   HGB 12.4 05/30/2021 1526   HCT 33.4 (L) 07/05/2023 0406   HCT 38.7 05/30/2021 1526   PLT 235 07/05/2023 0406   PLT 261 05/30/2021 1526   MCV 98.5 07/05/2023 0406   MCV 84 05/30/2021 1526   MCH 30.4 07/05/2023 0406   MCHC 30.8 07/05/2023 0406   RDW 16.0 (H) 07/05/2023 0406   RDW 16.4 (H) 05/30/2021 1526   LYMPHSABS 0.8 07/04/2023 1003   MONOABS 0.7 07/04/2023 1003   EOSABS 0.3 07/04/2023 1003   BASOSABS 0.1 07/04/2023 1003      Latest Ref Rng & Units 07/05/2023    4:06 AM 07/04/2023   10:03 AM 06/23/2023    4:30 PM  BMP  Glucose 70 - 99 mg/dL 93  94  99   BUN 8 - 23 mg/dL 24  29  25    Creatinine 0.44 - 1.00 mg/dL 6.06  3.01  6.01   Sodium 135 - 145 mmol/L 133  133  131   Potassium 3.5 - 5.1 mmol/L 4.6  4.3  4.6   Chloride 98 - 111 mmol/L 103  102  102   CO2 22 - 32 mmol/L 24  23  23    Calcium 8.9 - 10.3 mg/dL 8.7  9.3  9.4     CT ABDOMEN PELVIS W CONTRAST Result Date: 07/04/2023 CLINICAL DATA:  Abdominal/flank pain with stone suspected EXAM: CT ABDOMEN AND PELVIS WITH CONTRAST TECHNIQUE: Multidetector CT imaging of the abdomen and pelvis was performed using the standard protocol following bolus administration of intravenous contrast. RADIATION DOSE REDUCTION: This exam was performed according to the departmental dose-optimization program which includes automated exposure control, adjustment of the mA and/or kV according to patient size and/or use of iterative reconstruction technique. CONTRAST:  60mL OMNIPAQUE IOHEXOL 300 MG/ML  SOLN COMPARISON:  Eleven days prior FINDINGS: Lower chest: Cardiomegaly. Coronary and aortic atherosclerosis. Suspect prior anti reflux procedure with clips around the esophageal hiatus. Hepatobiliary: Low density at the portal triads, circumferential and favored periportal edema. This was also seen on prior. There is cholecystectomy without common bile duct dilatation.No focal liver lesion. Pancreas: No acute  finding. Spleen: Unremarkable. Adrenals/Urinary Tract: Negative adrenals. No hydronephrosis or stone. Relative right renal atrophy. Air within the urinary bladder which is pronounced in volume, no fistula seen or air on CT from last year. Stomach/Bowel: Diffuse stool distended colon but sparing the rectum, pattern similar to before. No small bowel obstruction or inflammation is seen. There is an anastomosis in the left abdomen involving small bowel. Vascular/Lymphatic: Extensive atheromatous calcification of the aorta and branch vessels. There is an IVC filter in stable position with legs extending beyond the caval wall. No mass or adenopathy. Reproductive:Hysterectomy Other: No ascites or pneumoperitoneum. Musculoskeletal: Advanced lumbar spine degeneration with scoliosis. Advanced bilateral hip osteoarthritis. No acute finding. IMPRESSION: 1. Continued generalized stool retention and colonic distension. 2. Continued prominent volume of bladder gas, correlate with urinalysis. 3. Chronic findings as described. Electronically Signed  By: Tiburcio Pea M.D.   On: 07/04/2023 11:59    Disposition Plan & Communication  Patient status: Inpatient  Admitted From: Home Planned disposition location: Home Anticipated discharge date: 3/18 pending completing antibiotics   Family Communication: none at bedside    Author: Leeroy Bock, DO [facility] 07/05/2023, 7:22 AM   Available by Epic secure chat 7AM-7PM. If 7PM-7AM, please contact night-coverage.  [facility] contact information found on [email or web address].

## 2023-07-05 NOTE — Progress Notes (Signed)
   07/05/23 1541  TOC Brief Assessment  Insurance and Status Reviewed  Patient has primary care physician Yes  Home environment has been reviewed reisdes in private residence with support from family  Prior level of function: Independent  Prior/Current Home Services No current home services  Social Drivers of Health Review SDOH reviewed no interventions necessary  Readmission risk has been reviewed Yes  Transition of care needs no transition of care needs at this time

## 2023-07-06 DIAGNOSIS — N390 Urinary tract infection, site not specified: Secondary | ICD-10-CM | POA: Diagnosis not present

## 2023-07-06 DIAGNOSIS — B9689 Other specified bacterial agents as the cause of diseases classified elsewhere: Secondary | ICD-10-CM | POA: Diagnosis not present

## 2023-07-06 LAB — BASIC METABOLIC PANEL
Anion gap: 8 (ref 5–15)
BUN: 20 mg/dL (ref 8–23)
CO2: 24 mmol/L (ref 22–32)
Calcium: 9.4 mg/dL (ref 8.9–10.3)
Chloride: 99 mmol/L (ref 98–111)
Creatinine, Ser: 1.05 mg/dL — ABNORMAL HIGH (ref 0.44–1.00)
GFR, Estimated: 52 mL/min — ABNORMAL LOW (ref >60)
Glucose, Bld: 105 mg/dL — ABNORMAL HIGH (ref 70–99)
Potassium: 4.6 mmol/L (ref 3.5–5.1)
Sodium: 131 mmol/L — ABNORMAL LOW (ref 135–145)

## 2023-07-06 LAB — CBC
HCT: 36 % (ref 36.0–46.0)
Hemoglobin: 11.2 g/dL — ABNORMAL LOW (ref 12.0–15.0)
MCH: 30.1 pg (ref 26.0–34.0)
MCHC: 31.1 g/dL (ref 30.0–36.0)
MCV: 96.8 fL (ref 80.0–100.0)
Platelets: 241 10*3/uL (ref 150–400)
RBC: 3.72 MIL/uL — ABNORMAL LOW (ref 3.87–5.11)
RDW: 15.8 % — ABNORMAL HIGH (ref 11.5–15.5)
WBC: 5.5 10*3/uL (ref 4.0–10.5)
nRBC: 0 % (ref 0.0–0.2)

## 2023-07-06 MED ORDER — ESTRADIOL 0.1 MG/GM VA CREA: 1.0000 | TOPICAL_CREAM | VAGINAL | Status: DC

## 2023-07-06 MED ORDER — POLYETHYLENE GLYCOL 3350 17 G PO PACK
17.0000 g | PACK | Freq: Every day | ORAL | Status: DC | PRN
Start: 2023-07-06 — End: 2023-07-09

## 2023-07-06 NOTE — Progress Notes (Signed)
 PROGRESS NOTE Amy Hickman    DOB: Aug 10, 1935, 88 y.o.  WUJ:[id number]    Code Status: Full Code   DOA: 07/04/2023   LOS: 2   Brief hospital course  Amy Hickman is a 88 y.o. female with medical history significant for CAD, atrial fibrillation on Eliquis, chronic HFpEF, history of recurrent DVTs, interstitial cystitis, recurrent UTIs, hypertension, hypothyroidism, and chronic constipation who presents with suprapubic pain, low back pain, and fevers.   Patient reports that she has been treated with oral antibiotics, IV antibiotics as an outpatient, intravesicular gentamicin installations, and recent fosfomycin, continues to experience fevers, suprapubic pain, and that she attributes to UTI. Also on topical estrogen replacement.  Urine culture from 06/23/2023 is growing ESBL Klebsiella.   [facility] ED Course: afebrile and saturating mid 90s on room air with normal heart rate and stable blood pressure.  Labs are most notable for creatinine 1.30 and normal WBC.  CT demonstrates stool retention, colonic distention, gas in the urinary bladder.   Blood culture was collected and the patient was treated with morphine, Zofran, Reglan, and cefepime.  She was transferred to [facility] for admission.  07/06/23 -improved. BxCx NGTD  Assessment & Plan  Principal Problem:   Urinary tract infection due to ESBL Klebsiella Active Problems:   Chronic heart failure with mildly reduced ejection fraction (HFmrEF, 41-49%) (HCC)   Hypothyroidism   Coronary artery disease involving native coronary artery of native heart without angina pectoris   Persistent atrial fibrillation (HCC)   COPD (chronic obstructive pulmonary disease) (HCC)   CKD stage 3b, GFR 30-44 ml/min (HCC)   ESBL UTI- urinalysis not significantly positive for infection this admission but does have pyuria. Unfortunately, was not sent for culture on admission. Treating based on recent culture data which was presumably not fully treated  given the heavy resistance profile.  - Treat with meropenem, follow clinical course, consider ID consultation   - continue topical estrogen replacement to reduce compounding contributions to discomfort and recurrent UTIs.    Atrial fibrillation  - Amiodarone, Coreg, diltiazem, Eliquis    Chronic HFpEF  - Appears compensated    CAD - No anginal complaints     Hypertension- remains mildly elevated - Hydralazine, Coreg, diltiazem, isordil   CKD 3B- at baseline  - Renally-dose medications    Hypothyroidism  - Synthroid    Chronic constipation- describes frequent Bms overnight likely in setting of Abx use. Changed her bowel regimen into PRN   Body mass index is 23.55 kg/m.  VTE ppx:  apixaban (ELIQUIS) tablet 5 mg   Diet:     Diet   Diet regular Fluid consistency: Thin   Consultants: None   Subjective 07/06/23    Pt reports feeling improved. Continues to have mild burning at end of urination. Had several Bms overnight interfering with sleep.    Objective   Vitals:   07/05/23 1757 07/05/23 2019 07/06/23 0117 07/06/23 0559  BP: (!) 154/66 (!) 151/71 (!) 153/69 (!) 161/71  Pulse: 61 61 65 63  Resp: 16 18 19 16   Temp: 97.8 F (36.6 C) 97.6 F (36.4 C) 98 F (36.7 C) (!) 97.5 F (36.4 C)  TempSrc: Oral Oral Oral Oral  SpO2: 90% 94% 93% 94%  Weight:      Height:        Intake/Output Summary (Last 24 hours) at 07/06/2023 9562 Last data filed at 07/06/2023 0610 Gross per 24 hour  Intake 330 ml  Output 200 ml  Net 130  ml   Filed Weights   07/04/23 0948  Weight: 60.3 kg     Physical Exam:  General: awake, alert, NAD HEENT: atraumatic, clear conjunctiva, anicteric sclera, MMM, hearing grossly normal Respiratory: normal respiratory effort. Cardiovascular: quick capillary refill, normal S1/S2, RRR, no JVD, murmurs Gastrointestinal: soft, NT, ND Nervous: A&O x3. no gross focal neurologic deficits, normal speech Extremities: moves all equally, no edema, normal  tone Skin: dry, intact, normal temperature, normal color. No rashes, lesions or ulcers on exposed skin Psychiatry: normal mood, congruent affect  Labs   I have personally reviewed the following labs and imaging studies CBC    Component Value Date/Time   WBC 5.5 07/06/2023 0448   RBC 3.72 (L) 07/06/2023 0448   HGB 11.2 (L) 07/06/2023 0448   HGB 12.4 05/30/2021 1526   HCT 36.0 07/06/2023 0448   HCT 38.7 05/30/2021 1526   PLT 241 07/06/2023 0448   PLT 261 05/30/2021 1526   MCV 96.8 07/06/2023 0448   MCV 84 05/30/2021 1526   MCH 30.1 07/06/2023 0448   MCHC 31.1 07/06/2023 0448   RDW 15.8 (H) 07/06/2023 0448   RDW 16.4 (H) 05/30/2021 1526   LYMPHSABS 0.8 07/04/2023 1003   MONOABS 0.7 07/04/2023 1003   EOSABS 0.3 07/04/2023 1003   BASOSABS 0.1 07/04/2023 1003      Latest Ref Rng & Units 07/06/2023    4:48 AM 07/05/2023    4:06 AM 07/04/2023   10:03 AM  BMP  Glucose 70 - 99 mg/dL 409  93  94   BUN 8 - 23 mg/dL 20  24  29    Creatinine 0.44 - 1.00 mg/dL 8.11  9.14  7.82   Sodium 135 - 145 mmol/L 131  133  133   Potassium 3.5 - 5.1 mmol/L 4.6  4.6  4.3   Chloride 98 - 111 mmol/L 99  103  102   CO2 22 - 32 mmol/L 24  24  23    Calcium 8.9 - 10.3 mg/dL 9.4  8.7  9.3     CT ABDOMEN PELVIS W CONTRAST Result Date: 07/04/2023 CLINICAL DATA:  Abdominal/flank pain with stone suspected EXAM: CT ABDOMEN AND PELVIS WITH CONTRAST TECHNIQUE: Multidetector CT imaging of the abdomen and pelvis was performed using the standard protocol following bolus administration of intravenous contrast. RADIATION DOSE REDUCTION: This exam was performed according to the departmental dose-optimization program which includes automated exposure control, adjustment of the mA and/or kV according to patient size and/or use of iterative reconstruction technique. CONTRAST:  60mL OMNIPAQUE IOHEXOL 300 MG/ML  SOLN COMPARISON:  Eleven days prior FINDINGS: Lower chest: Cardiomegaly. Coronary and aortic atherosclerosis. Suspect  prior anti reflux procedure with clips around the esophageal hiatus. Hepatobiliary: Low density at the portal triads, circumferential and favored periportal edema. This was also seen on prior. There is cholecystectomy without common bile duct dilatation.No focal liver lesion. Pancreas: No acute finding. Spleen: Unremarkable. Adrenals/Urinary Tract: Negative adrenals. No hydronephrosis or stone. Relative right renal atrophy. Air within the urinary bladder which is pronounced in volume, no fistula seen or air on CT from last year. Stomach/Bowel: Diffuse stool distended colon but sparing the rectum, pattern similar to before. No small bowel obstruction or inflammation is seen. There is an anastomosis in the left abdomen involving small bowel. Vascular/Lymphatic: Extensive atheromatous calcification of the aorta and branch vessels. There is an IVC filter in stable position with legs extending beyond the caval wall. No mass or adenopathy. Reproductive:Hysterectomy Other: No ascites or pneumoperitoneum. Musculoskeletal: Advanced  lumbar spine degeneration with scoliosis. Advanced bilateral hip osteoarthritis. No acute finding. IMPRESSION: 1. Continued generalized stool retention and colonic distension. 2. Continued prominent volume of bladder gas, correlate with urinalysis. 3. Chronic findings as described. Electronically Signed   By: Tiburcio Pea M.D.   On: 07/04/2023 11:59    Disposition Plan & Communication  Patient status: Inpatient  Admitted From: Home Planned disposition location: Home Anticipated discharge date: 3/19 pending completing antibiotics   Family Communication: none at bedside    Author: Leeroy Bock, DO [facility] 07/06/2023, 7:12 AM   Available by Epic secure chat 7AM-7PM. If 7PM-7AM, please contact night-coverage.  [facility] contact information found on [email or web address].

## 2023-07-06 NOTE — Progress Notes (Signed)
 Mobility Specialist - Progress Note   07/06/23 1317  Mobility  Activity Ambulated with assistance in hallway;Transferred to/from [facility]  Level of Assistance Standby assist, set-up cues, supervision of patient - no hands on  Assistive Device Front wheel walker  Distance Ambulated (ft) 200 ft  Activity Response Tolerated well  Mobility Referral Yes  Mobility visit 1 Mobility  Mobility Specialist Start Time (ACUTE ONLY) 1253  Mobility Specialist Stop Time (ACUTE ONLY) 1313  Mobility Specialist Time Calculation (min) (ACUTE ONLY) 20 min   Pt received in bed and agreeable to mobility. Upon returning to room, pt requested assistance to [facility]. No complaints during session. Pt to bed after session with all needs met.    [facility]

## 2023-07-07 DIAGNOSIS — N390 Urinary tract infection, site not specified: Secondary | ICD-10-CM | POA: Diagnosis not present

## 2023-07-07 DIAGNOSIS — B9689 Other specified bacterial agents as the cause of diseases classified elsewhere: Secondary | ICD-10-CM | POA: Diagnosis not present

## 2023-07-07 LAB — CBC
HCT: 33.3 % — ABNORMAL LOW (ref 36.0–46.0)
Hemoglobin: 10.6 g/dL — ABNORMAL LOW (ref 12.0–15.0)
MCH: 30.9 pg (ref 26.0–34.0)
MCHC: 31.8 g/dL (ref 30.0–36.0)
MCV: 97.1 fL (ref 80.0–100.0)
Platelets: 243 10*3/uL (ref 150–400)
RBC: 3.43 MIL/uL — ABNORMAL LOW (ref 3.87–5.11)
RDW: 15.8 % — ABNORMAL HIGH (ref 11.5–15.5)
WBC: 5.8 10*3/uL (ref 4.0–10.5)
nRBC: 0 % (ref 0.0–0.2)

## 2023-07-07 LAB — BASIC METABOLIC PANEL
Anion gap: 7 (ref 5–15)
BUN: 33 mg/dL — ABNORMAL HIGH (ref 8–23)
CO2: 21 mmol/L — ABNORMAL LOW (ref 22–32)
Calcium: 9.6 mg/dL (ref 8.9–10.3)
Chloride: 101 mmol/L (ref 98–111)
Creatinine, Ser: 1.25 mg/dL — ABNORMAL HIGH (ref 0.44–1.00)
GFR, Estimated: 42 mL/min — ABNORMAL LOW (ref >60)
Glucose, Bld: 98 mg/dL (ref 70–99)
Potassium: 4.9 mmol/L (ref 3.5–5.1)
Sodium: 129 mmol/L — ABNORMAL LOW (ref 135–145)

## 2023-07-07 MED ORDER — SODIUM CHLORIDE 0.9 % IV SOLN: INTRAVENOUS | Status: DC

## 2023-07-07 MED ORDER — LACTATED RINGERS IV SOLN: INTRAVENOUS | Status: DC

## 2023-07-07 NOTE — Progress Notes (Signed)
 PROGRESS NOTE    Amy Hickman  ZOX:[id number] DOB: 1936/03/17 DOA: 07/04/2023 PCP: Cheron Schaumann., MD   Brief Narrative: 88 year old with past medical history significant for CAD, A-fib on Eliquis, chronic heart failure preserved ejection fraction, history of recurrent DVTs, interstitial cystitis, recurrent UTIs, hypertension, hypothyroidism, chronic constipation who presented with suprapubic pain, back pain dysuria and fevers.  She has been recently treated with oral antibiotic as an outpatient and intravesicular gentamicin instillation and recent fosfomycin, she presented with persistent fevers, suprapubic pain.  Urine culture on 06/23/2023 was growing ESBL Klebsiella.  CT demonstrated stool retention, colonic distention, gas in the urinary bladder.   Assessment & Plan:   Principal Problem:   Urinary tract infection due to ESBL Klebsiella Active Problems:   Chronic heart failure with mildly reduced ejection fraction (HFmrEF, 41-49%) (HCC)   Hypothyroidism   Coronary artery disease involving native coronary artery of native heart without angina pectoris   Persistent atrial fibrillation (HCC)   COPD (chronic obstructive pulmonary disease) (HCC)   CKD stage 3b, GFR 30-44 ml/min (HCC)  1-ESBL UTI: Urinalysis no significantly positive for infection but she does describe pyuria and presented with persistent fever.  Urine culture was not collected on admission.  She has been started on meropenem and continue. ID consulted for tomorrow   A fib : Continue amiodarone Coreg diltiazem and Eliquis  Chronic heart failure preserved ejection fraction: appears compensated  CAD: Denies chest pain  Hypertension: Continue with hydralazine Coreg diltiazem and isosorbide   Chronic constipation: Describes frequent bowel movement yesterday. Continue with prucalopride, plecanatide.   Hyponatremia; appears dehydrated. Will give IV fluid for 24 hour  Estimated body mass index is 23.55 kg/m  as calculated from the following:   Height as of this encounter: 5\' 3"  (1.6 m).   Weight as of this encounter: 60.3 kg.   DVT prophylaxis: Eliquis Code Status: Full code Family Communication:Daughter at bedside.  Disposition Plan:  Status is: Inpatient Remains inpatient appropriate because: management of UTI    Consultants:    Procedures:    Antimicrobials:    Subjective: Patient report dysuria improving some.  Had BM yesterday.  She has been able to urinate  Objective: Vitals:   07/06/23 1412 07/06/23 2026 07/07/23 0121 07/07/23 0505  BP: (!) 129/59 (!) 142/70 (!) 140/67 (!) 148/70  Pulse: (!) 59 63 64 61  Resp: 15 17 15 15   Temp: 98.1 F (36.7 C) 97.9 F (36.6 C) 97.8 F (36.6 C) (!) 97.2 F (36.2 C)  TempSrc: Oral Oral Oral Oral  SpO2: 93% 95% 96% 96%  Weight:      Height:        Intake/Output Summary (Last 24 hours) at 07/07/2023 0817 Last data filed at 07/07/2023 0600 Gross per 24 hour  Intake 1660 ml  Output 200 ml  Net 1460 ml   Filed Weights   07/04/23 0948  Weight: 60.3 kg    Examination:  General exam: Appears calm and comfortable  Respiratory system: Clear to auscultation. Respiratory effort normal. Cardiovascular system: S1 & S2 heard, RRR. No JVD, murmurs, rubs, gallops or clicks. No pedal edema. Gastrointestinal system: Abdomen is nondistended, soft and nontender. No organomegaly or masses felt. Normal bowel sounds heard. Central nervous system: Alert and oriented.  Extremities: Symmetric 5 x 5 power.   Data Reviewed: I have personally reviewed following labs and imaging studies  CBC: Recent Labs  Lab 07/04/23 1003 07/05/23 0406 07/06/23 0448 07/07/23 0517  WBC 6.3 5.2 5.5 5.8  NEUTROABS 4.4  --   --   --   HGB 11.1* 10.3* 11.2* 10.6*  HCT 34.4* 33.4* 36.0 33.3*  MCV 94.2 98.5 96.8 97.1  PLT 278 235 241 243   Basic Metabolic Panel: Recent Labs  Lab 07/04/23 1003 07/05/23 0406 07/06/23 0448 07/07/23 0517  NA 133*  133* 131* 129*  K 4.3 4.6 4.6 4.9  CL 102 103 99 101  CO2 23 24 24  21*  GLUCOSE 94 93 105* 98  BUN 29* 24* 20 33*  CREATININE 1.30* 1.24* 1.05* 1.25*  CALCIUM 9.3 8.7* 9.4 9.6   GFR: Estimated Creatinine Clearance: 27.2 mL/min (A) (by C-G formula based on SCr of 1.25 mg/dL (H)). Liver Function Tests: No results for input(s): "AST", "ALT", "ALKPHOS", "BILITOT", "PROT", "ALBUMIN" in the last 168 hours. No results for input(s): "LIPASE", "AMYLASE" in the last 168 hours. No results for input(s): "AMMONIA" in the last 168 hours. Coagulation Profile: No results for input(s): "INR", "PROTIME" in the last 168 hours. Cardiac Enzymes: No results for input(s): "CKTOTAL", "CKMB", "CKMBINDEX", "TROPONINI" in the last 168 hours. BNP (last 3 results) No results for input(s): "PROBNP" in the last 8760 hours. HbA1C: No results for input(s): "HGBA1C" in the last 72 hours. CBG: No results for input(s): "GLUCAP" in the last 168 hours. Lipid Profile: No results for input(s): "CHOL", "HDL", "LDLCALC", "TRIG", "CHOLHDL", "LDLDIRECT" in the last 72 hours. Thyroid Function Tests: No results for input(s): "TSH", "T4TOTAL", "FREET4", "T3FREE", "THYROIDAB" in the last 72 hours. Anemia Panel: No results for input(s): "VITAMINB12", "FOLATE", "FERRITIN", "TIBC", "IRON", "RETICCTPCT" in the last 72 hours. Sepsis Labs: No results for input(s): "PROCALCITON", "LATICACIDVEN" in the last 168 hours.  Recent Results (from the past 240 hours)  Blood culture (routine x 2)     Status: None (Preliminary result)   Collection Time: 07/04/23 10:09 AM   Specimen: BLOOD  Result Value Ref Range Status   Specimen Description   Final    BLOOD RIGHT ANTECUBITAL Performed at [facility], [address] [place], [place] [address]    Special Requests   Final    BOTTLES DRAWN AEROBIC AND ANAEROBIC Blood Culture results may not be optimal due to an inadequate volume of blood received in culture bottles Performed  at [facility], [address] [place], [place] [address]    Culture   Final    NO GROWTH 3 DAYS Performed at [facility], [address], [place], [place] [address]    Report Status PENDING  Incomplete         Radiology Studies: No results found.      Scheduled Meds:  amiodarone  200 mg Oral Daily   apixaban  5 mg Oral BID   carvedilol  12.5 mg Oral BID   diltiazem  360 mg Oral Daily   hydrALAZINE  100 mg Oral TID   isosorbide dinitrate  10 mg Oral BID   latanoprost  1 drop Both Eyes QHS   levothyroxine  112 mcg Oral QAC breakfast   pantoprazole  40 mg Oral Daily   pilocarpine  1 drop Right Eye BID   Plecanatide  1 tablet Oral Daily   Prucalopride Succinate  2 mg Oral Daily   Continuous Infusions:  meropenem (MERREM) IV 1 g (07/06/23 2131)     LOS: 3 days    Time spent: 35 minutes    Rowin Bayron A Itzelle Gains, MD [facility]   If 7PM-7AM, please contact night-coverage [email or web address]  07/07/2023, 8:17 AM

## 2023-07-07 NOTE — Progress Notes (Signed)
 Mobility Specialist - Progress Note   07/07/23 0937  Mobility  Activity Ambulated with assistance in hallway  Level of Assistance Standby assist, set-up cues, supervision of patient - no hands on  Assistive Device Front wheel walker  Distance Ambulated (ft) 80 ft  Activity Response Tolerated well  Mobility Referral Yes  Mobility visit 1 Mobility  Mobility Specialist Start Time (ACUTE ONLY) [id number]  Mobility Specialist Stop Time (ACUTE ONLY) [id number]  Mobility Specialist Time Calculation (min) (ACUTE ONLY) 11 min   Pt received in bed and agreeable to mobility. No complaints during session. Pt to bed after session with all needs met.    [facility]

## 2023-07-07 NOTE — Plan of Care (Signed)
   Problem: Clinical Measurements: Goal: Will remain free from infection Outcome: Progressing Goal: Diagnostic test results will improve Outcome: Progressing

## 2023-07-08 DIAGNOSIS — B9689 Other specified bacterial agents as the cause of diseases classified elsewhere: Secondary | ICD-10-CM | POA: Diagnosis not present

## 2023-07-08 DIAGNOSIS — B961 Klebsiella pneumoniae [K. pneumoniae] as the cause of diseases classified elsewhere: Secondary | ICD-10-CM | POA: Diagnosis not present

## 2023-07-08 DIAGNOSIS — N390 Urinary tract infection, site not specified: Secondary | ICD-10-CM | POA: Diagnosis not present

## 2023-07-08 LAB — CBC
HCT: 32.7 % — ABNORMAL LOW (ref 36.0–46.0)
Hemoglobin: 10 g/dL — ABNORMAL LOW (ref 12.0–15.0)
MCH: 30.2 pg (ref 26.0–34.0)
MCHC: 30.6 g/dL (ref 30.0–36.0)
MCV: 98.8 fL (ref 80.0–100.0)
Platelets: 226 10*3/uL (ref 150–400)
RBC: 3.31 MIL/uL — ABNORMAL LOW (ref 3.87–5.11)
RDW: 15.9 % — ABNORMAL HIGH (ref 11.5–15.5)
WBC: 5.2 10*3/uL (ref 4.0–10.5)
nRBC: 0 % (ref 0.0–0.2)

## 2023-07-08 LAB — BASIC METABOLIC PANEL
Anion gap: 6 (ref 5–15)
BUN: 35 mg/dL — ABNORMAL HIGH (ref 8–23)
CO2: 23 mmol/L (ref 22–32)
Calcium: 9.1 mg/dL (ref 8.9–10.3)
Chloride: 103 mmol/L (ref 98–111)
Creatinine, Ser: 1.09 mg/dL — ABNORMAL HIGH (ref 0.44–1.00)
GFR, Estimated: 50 mL/min — ABNORMAL LOW (ref >60)
Glucose, Bld: 93 mg/dL (ref 70–99)
Potassium: 4.2 mmol/L (ref 3.5–5.1)
Sodium: 132 mmol/L — ABNORMAL LOW (ref 135–145)

## 2023-07-08 MED ORDER — VITAMIN B-12 1000 MCG PO TABS
500.0000 ug | ORAL_TABLET | Freq: Every day | ORAL | Status: DC
  Administered 2023-07-08 – 2023-07-09 (×2): 500 ug via ORAL
  Filled 2023-07-08 (×2): qty 1

## 2023-07-08 MED ORDER — ESTROGENS CONJUGATED 0.625 MG/GM VA CREA
1.0000 | TOPICAL_CREAM | Freq: Every day | VAGINAL | Status: DC
  Administered 2023-07-08: 1 via VAGINAL
  Filled 2023-07-08: qty 30

## 2023-07-08 MED ORDER — SODIUM CHLORIDE 0.9 % IV SOLN
1.0000 g | Freq: Once | INTRAVENOUS | Status: AC
  Administered 2023-07-08: 1 g via INTRAVENOUS
  Filled 2023-07-08: qty 1000

## 2023-07-08 MED ORDER — ESTROGENS CONJUGATED 0.625 MG/GM VA CREA: 1.0000 | TOPICAL_CREAM | Freq: Every day | VAGINAL | Status: DC

## 2023-07-08 MED ORDER — ESTROGENS CONJUGATED 0.625 MG/GM VA CREA: TOPICAL_CREAM | VAGINAL | 12 refills | Status: AC

## 2023-07-08 MED ORDER — FOLIC ACID 1 MG PO TABS
1.0000 mg | ORAL_TABLET | Freq: Every day | ORAL | Status: DC
  Administered 2023-07-08 – 2023-07-09 (×2): 1 mg via ORAL
  Filled 2023-07-08 (×2): qty 1

## 2023-07-08 MED ORDER — ESTROGENS CONJUGATED 0.625 MG/GM VA CREA
1.0000 | TOPICAL_CREAM | VAGINAL | Status: DC
  Filled 2023-07-08: qty 30

## 2023-07-08 NOTE — Discharge Summary (Signed)
 Physician Discharge Summary   Patient: Amy Hickman MRN: [id number] DOB: February 09, 1936  Admit date:     07/04/2023  Discharge date: 07/08/23  Discharge Physician: Alba Cory   PCP: Cheron Schaumann., MD   Recommendations at discharge:    Follow up with ID [id number]  Resume follow up with urology   Discharge Diagnoses: Principal Problem:   Urinary tract infection due to ESBL Klebsiella Active Problems:   Chronic heart failure with mildly reduced ejection fraction (HFmrEF, 41-49%) (HCC)   Hypothyroidism   Coronary artery disease involving native coronary artery of native heart without angina pectoris   Persistent atrial fibrillation (HCC)   COPD (chronic obstructive pulmonary disease) (HCC)   CKD stage 3b, GFR 30-44 ml/min (HCC)  Resolved Problems:   * No resolved hospital problems. *  Hospital Course: 88 year old with past medical history significant for CAD, A-fib on Eliquis, chronic heart failure preserved ejection fraction, history of recurrent DVTs, interstitial cystitis, recurrent UTIs, hypertension, hypothyroidism, chronic constipation who presented with suprapubic pain, back pain dysuria and fevers.   She has been recently treated with oral antibiotic as an outpatient and intravesicular gentamicin instillation and recent fosfomycin, she presented with persistent fevers, suprapubic pain.  Urine culture on 06/23/2023 was growing ESBL Klebsiella.  CT demonstrated stool retention, colonic distention, gas in the urinary bladder.    Assessment and Plan: 1-ESBL UTI: Urinalysis no significantly positive for infection but she does describe pyuria and presented with persistent fever.  Urine culture was not collected on admission.  She has been started on meropenem and continue. ID consulted, recommended to complete 5 days of antibiotics, she has recievd 4 days Meropenem and one day today of etarnepenem.     A fib : Continue amiodarone Coreg diltiazem and Eliquis   Chronic  heart failure preserved ejection fraction: appears compensated   CAD: Denies chest pain   Hypertension: Continue with hydralazine Coreg diltiazem and isosorbide    Chronic constipation: Describes frequent bowel movement yesterday. Continue with prucalopride, plecanatide.   having BM  Hyponatremia; appears dehydrated. improved   Estimated body mass index is 23.55 kg/m as calculated from the following:   Height as of this encounter: 5\' 3"  (1.6 m).   Weight as of this encounter: 60.3 kg.          Consultants: ID Procedures performed: none  Disposition: Home Diet recommendation:  Discharge Diet Orders (From admission, onward)     Start     Ordered   07/08/23 0000  Diet - low sodium heart healthy        07/08/23 1502           Cardiac diet DISCHARGE MEDICATION: Allergies as of 07/08/2023       Reactions   Cephalexin Other (See Comments)   Abdominal pain , GI distress, Abdominal pain , GI distress   Hydrocodone-acetaminophen Other (See Comments), Rash   Palpitations, flushing, hyperactivity, Chest pain, Chest pain   Ativan [lorazepam] Other (See Comments)   "Knocked her out, woke up 3 days later"   Augmentin [amoxicillin-pot Clavulanate] Nausea And Vomiting   Avelox [moxifloxacin] Itching, Nausea Only   Brimonidine Itching, Swelling   redness   Chlorzoxazone Other (See Comments)   Went crazy/loopy   Ciprofloxacin Hcl Itching, Nausea Only   Clavulanic Acid Other (See Comments)    Unknown   Crestor [rosuvastatin] Other (See Comments)   myalgia   Ezetimibe Diarrhea   Stomach cramps   Gluten Meal Other (See Comments)   Diarrhea, hot/cold  sweat, will sleep for a couple of hours - Celiac disease   Lansoprazole Other (See Comments)   GI intolerance   Nitrofuran Derivatives Itching, Nausea Only   Phenergan [promethazine Hcl] Other (See Comments)   hallucinations   Wheat Other (See Comments)   Gi distress- celiac disease   Codeine Palpitations   Darvon  [propoxyphene Hcl] Palpitations   Doxycycline Itching, Nausea Only        Medication List     STOP taking these medications    cefdinir 300 MG capsule Commonly known as: OMNICEF   estradiol 0.1 MG/GM vaginal cream Commonly known as: ESTRACE   furosemide 40 MG tablet Commonly known as: LASIX       TAKE these medications    acetaminophen 500 MG tablet Commonly known as: TYLENOL Take 1 tablet (500 mg total) by mouth every 6 (six) hours as needed for pain. What changed:  how much to take when to take this reasons to take this   amiodarone 200 MG tablet Commonly known as: PACERONE Take 1 tablet (200 mg total) by mouth daily.   Artificial Tears 1.4 % ophthalmic solution Generic drug: polyvinyl alcohol Place 1 drop into both eyes as needed for dry eyes.   carvedilol 12.5 MG tablet Commonly known as: COREG Take 1 tablet (12.5 mg total) by mouth 2 (two) times daily.   clidinium-chlordiazePOXIDE 5-2.5 MG capsule Commonly known as: LIBRAX Take 2 capsules by mouth daily as needed (cramping).   conjugated estrogens vaginal cream Commonly known as: PREMARIN Place 1 Applicatorful vaginally at bedtime for 7 days, THEN 1 Applicatorful 2 (two) times a week for 7 days. Start taking on: July 08, 2023   CYSTEX PO Take 1 Capful by mouth daily.   diltiazem 360 MG 24 hr capsule Commonly known as: CARDIZEM CD Take 1 capsule (360 mg total) by mouth daily.   Eliquis 5 MG Tabs tablet Generic drug: apixaban TAKE 1 TABLET TWICE A DAY   esomeprazole 40 MG capsule Commonly known as: NEXIUM Take 40 mg by mouth daily before breakfast.   fenofibrate 54 MG tablet Take 54 mg by mouth every evening.   hydrALAZINE 100 MG tablet Commonly known as: APRESOLINE TAKE 1 TABLET THREE TIMES A DAY   isosorbide dinitrate 10 MG tablet Commonly known as: ISORDIL Take 10 mg by mouth 2 (two) times daily.   latanoprost 0.005 % ophthalmic solution Commonly known as: XALATAN Place 1 drop  into both eyes at bedtime.   levothyroxine 112 MCG tablet Commonly known as: SYNTHROID Take 112 mcg by mouth daily before breakfast.   Motegrity 2 MG Tabs Generic drug: Prucalopride Succinate TAKE 1 TABLET(2 MG) BY MOUTH DAILY What changed:  how much to take how to take this when to take this additional instructions   ondansetron 4 MG tablet Commonly known as: ZOFRAN TAKE 1 TABLET EVERY 8 HOURS AS NEEDED FOR NAUSEA OR VOMITING What changed: See the new instructions.   Oscimin 0.125 MG Subl Generic drug: Hyoscyamine Sulfate SL Take 0.125 mg by mouth as needed (cramping).   pilocarpine 1 % ophthalmic solution Commonly known as: PILOCAR Place 1 drop into the right eye 2 (two) times daily.   polyethylene glycol 17 g packet Commonly known as: MIRALAX / GLYCOLAX Take 17 g by mouth 2 (two) times daily.   Trulance 3 MG Tabs Generic drug: Plecanatide Take 1 tablet by mouth daily.   Vitamin D (Ergocalciferol) 1.25 MG (50000 UNIT) Caps capsule Commonly known as: DRISDOL Take 50,000 Units by  mouth every 7 (seven) days.        Discharge Exam: Filed Weights   07/04/23 0948  Weight: 60.3 kg   General; NAD  Condition at discharge: stable  The results of significant diagnostics from this hospitalization (including imaging, microbiology, ancillary and laboratory) are listed below for reference.   Imaging Studies: CT ABDOMEN PELVIS W CONTRAST Result Date: 07/04/2023 CLINICAL DATA:  Abdominal/flank pain with stone suspected EXAM: CT ABDOMEN AND PELVIS WITH CONTRAST TECHNIQUE: Multidetector CT imaging of the abdomen and pelvis was performed using the standard protocol following bolus administration of intravenous contrast. RADIATION DOSE REDUCTION: This exam was performed according to the departmental dose-optimization program which includes automated exposure control, adjustment of the mA and/or kV according to patient size and/or use of iterative reconstruction technique.  CONTRAST:  60mL OMNIPAQUE IOHEXOL 300 MG/ML  SOLN COMPARISON:  Eleven days prior FINDINGS: Lower chest: Cardiomegaly. Coronary and aortic atherosclerosis. Suspect prior anti reflux procedure with clips around the esophageal hiatus. Hepatobiliary: Low density at the portal triads, circumferential and favored periportal edema. This was also seen on prior. There is cholecystectomy without common bile duct dilatation.No focal liver lesion. Pancreas: No acute finding. Spleen: Unremarkable. Adrenals/Urinary Tract: Negative adrenals. No hydronephrosis or stone. Relative right renal atrophy. Air within the urinary bladder which is pronounced in volume, no fistula seen or air on CT from last year. Stomach/Bowel: Diffuse stool distended colon but sparing the rectum, pattern similar to before. No small bowel obstruction or inflammation is seen. There is an anastomosis in the left abdomen involving small bowel. Vascular/Lymphatic: Extensive atheromatous calcification of the aorta and branch vessels. There is an IVC filter in stable position with legs extending beyond the caval wall. No mass or adenopathy. Reproductive:Hysterectomy Other: No ascites or pneumoperitoneum. Musculoskeletal: Advanced lumbar spine degeneration with scoliosis. Advanced bilateral hip osteoarthritis. No acute finding. IMPRESSION: 1. Continued generalized stool retention and colonic distension. 2. Continued prominent volume of bladder gas, correlate with urinalysis. 3. Chronic findings as described. Electronically Signed   By: Tiburcio Pea M.D.   On: 07/04/2023 11:59   CT ABDOMEN PELVIS W CONTRAST Result Date: 06/23/2023 CLINICAL DATA:  Abdominal pain. EXAM: CT ABDOMEN AND PELVIS WITH CONTRAST TECHNIQUE: Multidetector CT imaging of the abdomen and pelvis was performed using the standard protocol following bolus administration of intravenous contrast. RADIATION DOSE REDUCTION: This exam was performed according to the departmental dose-optimization  program which includes automated exposure control, adjustment of the mA and/or kV according to patient size and/or use of iterative reconstruction technique. CONTRAST:  80mL OMNIPAQUE IOHEXOL 300 MG/ML  SOLN COMPARISON:  January 20, 2023 FINDINGS: Lower chest: No acute abnormality. Hepatobiliary: No focal liver abnormality is seen. Status post cholecystectomy. No biliary dilatation. Pancreas: Unremarkable. No pancreatic ductal dilatation or surrounding inflammatory changes. Spleen: Normal in size without focal abnormality. Adrenals/Urinary Tract: Adrenal glands are unremarkable. Kidneys are normal, without renal calculi, focal lesion, or hydronephrosis. A mild-to-moderate amount of air is seen within the urinary bladder lumen. Stomach/Bowel: Surgical clips are seen along the gastroesophageal junction. The appendix is surgically absent. A very large stool burden is noted. A redundant sigmoid colon is seen. Surgically anastomosed bowel is noted within the posterior aspect of the pelvis on the right. No evidence of bowel wall thickening, distention, or inflammatory changes. Vascular/Lymphatic: Aortic atherosclerosis. An inferior vena cava filter is present. No enlarged abdominal or pelvic lymph nodes. Reproductive: Status post hysterectomy. No adnexal masses. Other: No abdominal wall hernia or abnormality. No abdominopelvic ascites. Musculoskeletal: There is  moderate severity levoscoliosis of the lumbar spine with marked severity multilevel degenerative changes. IMPRESSION: 1. Very large stool burden without evidence of bowel obstruction. 2. Mild-to-moderate amount of air within the urinary bladder lumen, which may be secondary to recent instrumentation. 3. Evidence of prior cholecystectomy, appendectomy and hysterectomy. 4. Moderate severity levoscoliosis of the lumbar spine with marked severity multilevel degenerative changes. 5. Aortic atherosclerosis. Aortic Atherosclerosis (ICD10-I70.0). Electronically Signed   By:  Aram Candela M.D.   On: 06/23/2023 21:50    Microbiology: Results for orders placed or performed during the hospital encounter of 07/04/23  Blood culture (routine x 2)     Status: None (Preliminary result)   Collection Time: 07/04/23 10:09 AM   Specimen: BLOOD  Result Value Ref Range Status   Specimen Description   Final    BLOOD RIGHT ANTECUBITAL Performed at [facility], [address] [place], [place] [address]    Special Requests   Final    BOTTLES DRAWN AEROBIC AND ANAEROBIC Blood Culture results may not be optimal due to an inadequate volume of blood received in culture bottles Performed at [facility], [address] [place], [place] [address]    Culture   Final    NO GROWTH 4 DAYS Performed at [facility], [address], [place], [place] [address]    Report Status PENDING  Incomplete    Labs: CBC: Recent Labs  Lab 07/04/23 1003 07/05/23 0406 07/06/23 0448 07/07/23 0517 07/08/23 0502  WBC 6.3 5.2 5.5 5.8 5.2  NEUTROABS 4.4  --   --   --   --   HGB 11.1* 10.3* 11.2* 10.6* 10.0*  HCT 34.4* 33.4* 36.0 33.3* 32.7*  MCV 94.2 98.5 96.8 97.1 98.8  PLT 278 235 241 243 226   Basic Metabolic Panel: Recent Labs  Lab 07/04/23 1003 07/05/23 0406 07/06/23 0448 07/07/23 0517 07/08/23 0502  NA 133* 133* 131* 129* 132*  K 4.3 4.6 4.6 4.9 4.2  CL 102 103 99 101 103  CO2 23 24 24  21* 23  GLUCOSE 94 93 105* 98 93  BUN 29* 24* 20 33* 35*  CREATININE 1.30* 1.24* 1.05* 1.25* 1.09*  CALCIUM 9.3 8.7* 9.4 9.6 9.1   Liver Function Tests: No results for input(s): "AST", "ALT", "ALKPHOS", "BILITOT", "PROT", "ALBUMIN" in the last 168 hours. CBG: No results for input(s): "GLUCAP" in the last 168 hours.  Discharge time spent: greater than 30 minutes.  Signed: Alba Cory, MD [facility] 07/08/2023

## 2023-07-08 NOTE — Consult Note (Addendum)
 Date of Admission:  07/04/2023          Reason for Consult: Recurrent urinary tract infections and colonization currently being treated presumptively for ESBL infection    Referring Provider: Hartley Barefoot, MD   Assessment:  Presumptive of urinary tract infection potentially due to ESBL Klebsiella Colonization with ESBL History of interstitial cystitis CAD CKD  Plan:  Give 1 dose of ertapenem today to complete 5 days of treatment Continue contact precautions in the hospital by have counseled the daughter that there is no requirement for contact precautions at home and that precautions to be taken the hospital are due to the higher requirements for preventing transmission of microbes and that there is low risk of her transmitting this to other family members [facility] Premarin vaginal cream to see if this helps with some of her symptoms this would be given nightly x one week then twice weekly I will schedule her for follow-up in our clinic so that we can potentially help her long-term with decisions with regards to antibiotics and when not to give antibiotics   Amy Hickman has an appointment on 08/10/2023 at 10Am with Dr. Daiva Eves at  [facility], which  is located in the [facility] at  [address] in [place].  [address], which is located to the left of the elevators.  Phone: [phone]  Fax: [phone]  [email or web address]  The patient should arrive 30 minutes prior to their appoitment.  I will sign off for now  Please call with further questions.   Principal Problem:   Urinary tract infection due to ESBL Klebsiella Active Problems:   Hypothyroidism   Coronary artery disease involving native coronary artery of native heart without angina pectoris   Chronic heart failure with mildly reduced ejection fraction (HFmrEF, 41-49%) (HCC)   Persistent atrial fibrillation (HCC)    COPD (chronic obstructive pulmonary disease) (HCC)   CKD stage 3b, GFR 30-44 ml/min (HCC)   Scheduled Meds:  amiodarone  200 mg Oral Daily   apixaban  5 mg Oral BID   carvedilol  12.5 mg Oral BID   diltiazem  360 mg Oral Daily   hydrALAZINE  100 mg Oral TID   isosorbide dinitrate  10 mg Oral BID   latanoprost  1 drop Both Eyes QHS   levothyroxine  112 mcg Oral QAC breakfast   pantoprazole  40 mg Oral Daily   pilocarpine  1 drop Right Eye BID   Plecanatide  1 tablet Oral Daily   Prucalopride Succinate  2 mg Oral Daily   Continuous Infusions:  ertapenem     PRN Meds:.acetaminophen **OR** acetaminophen, clidinium-chlordiazePOXIDE, ondansetron (ZOFRAN) IV, polyethylene glycol  HPI: Amy Hickman is a 88 y.o. female with past medical history significant for coronary artery disease atrial fibrillation chronic heart failure chronic kidney disease interstitial cystitis bacterial colonization of the urine who has been treated repeatedly for urinary tract infections.  The fact that she has interstitial cystitis which I did not know at the moment in time which I came to visit her does complicate interpretation of urinary symptoms.  In talking however to the patient and her daughter who was on cell phone today it does sound like the patient has symptoms of dysuria and at times flank pain and sometimes more serious symptoms such as lethargy.  She has been treated with multiple rounds of antibiotics and been followed closely by [facility]  with instillation of antibiotics into the bladder having been performed as well.  Most recently she grew an ESBL from urine and was given fosfomycin.  She then presented to the hospital 16 March with suprapubic pain low back pain and fevers.  ER CT scan was performed showed some stool retention and colonic distention with some gas in the bladder.  Urine showed some pyuria but cultures were not sent.  Cultures were sent but which fortunately did not  yield any organism she was initially given cefepime but then changed over to meropenem and has gotten through 4 days of treatment so far.  She is improved clinically and has no symptoms at present.  I think she can be treated with 1 dose of ertapenem to complete 5 days of therapy.  Unfortunately there is no clear-cut removable nidus of infection in her bladder such as a kidney stone or a foreign body such as a stent which urology could intervene upon.  There is also unfortunately nothing magical that we can provide from infectious the standpoint in terms of preventing recurrent infections.  We can however provide counsel on when infection is actually happening versus colonization and hopefully help to distinguish between symptoms of interstitial cystitis and actual infection though this latter maneuver is difficult she might benefit from intravaginal Premarin cream and I will put a order in for this.  I will also schedule her with [place] in the outpatient clinic as she may benefit from being seen by [place] in conjunction with urology chronically to help guide antibiotic selection and in particular restraint when antibiotics are not needed restraint with antibiotics would help in terms of the possibility of her flora becoming less resistant.  I have personally spent 82 minutes involved in face-to-face and non-face-to-face activities for this patient on the day of the visit. Professional time spent includes the following activities: Preparing to see the patient (review of tests), Obtaining and/or reviewing separately obtained history (admission/discharge record), Performing a medically appropriate examination and/or evaluation , Ordering medications/tests/procedures, referring and communicating with other health care professionals, Documenting clinical information in the EMR, Independently interpreting results (not separately reported), Communicating results to the patient/family/caregiver, Counseling and  educating the patient/family/caregiver and Care coordination (not separately reported).   Evaluation of this patient requires complex antimicrobial therapy evaluation and counseling + isolation needs for disease transmission risk assessment and mitigation.    Review of Systems: Review of Systems  Constitutional:  Negative for chills, fever, malaise/fatigue and weight loss.  HENT:  Negative for congestion and sore throat.   Eyes:  Negative for blurred vision and photophobia.  Respiratory:  Negative for cough, shortness of breath and wheezing.   Cardiovascular:  Negative for chest pain, palpitations and leg swelling.  Gastrointestinal:  Positive for abdominal pain and constipation. Negative for blood in stool, diarrhea, heartburn, melena, nausea and vomiting.  Genitourinary:  Positive for dysuria. Negative for flank pain and hematuria.  Musculoskeletal:  Negative for back pain, falls, joint pain and myalgias.  Skin:  Negative for itching and rash.  Neurological:  Negative for dizziness, focal weakness, loss of consciousness, weakness and headaches.  Endo/Heme/Allergies:  Does not bruise/bleed easily.  Psychiatric/Behavioral:  Negative for depression and suicidal ideas. The patient does not have insomnia.     Past Medical History:  Diagnosis Date   Atrial fibrillation, chronic (HCC)    s/p multiple cardioversions with sustained NSR, on Eliquis   Celiac disease    Chronic systolic CHF (congestive heart failure) (HCC)  Coronary artery disease involving native coronary artery of native heart without angina pectoris 03/15/2021   GERD (gastroesophageal reflux disease)    HOH (hard of hearing)    Hyperparathyroidism (HCC) 12/12/2020   Hypertension    Interstitial cystitis     Social History   Tobacco Use   Smoking status: Former    Current packs/day: 0.00    Average packs/day: 2.0 packs/day for 33.0 years (66.0 ttl pk-yrs)    Types: Cigarettes    Start date: 89    Quit date: 37     Years since quitting: 44.2   Smokeless tobacco: Never  Vaping Use   Vaping status: Never Used  Substance Use Topics   Alcohol use: Not Currently    Comment: rare   Drug use: No    Family History  Problem Relation Age of Onset   Liver cancer Sister    Bone cancer Sister    Bladder Cancer Sister    Brain cancer Sister    Lung cancer Sister    Heart disease Sister    Hypertension Sister    Pancreatic cancer Brother    Liver cancer Brother    Lung cancer Brother    Hypertension Other    Colon cancer Neg Hx    Colon polyps Neg Hx    Esophageal cancer Neg Hx    Rectal cancer Neg Hx    Stomach cancer Neg Hx    Allergies  Allergen Reactions   Cephalexin Other (See Comments)    Abdominal pain , GI distress, Abdominal pain , GI distress   Hydrocodone-Acetaminophen Other (See Comments) and Rash    Palpitations, flushing, hyperactivity, Chest pain, Chest pain   Ativan [Lorazepam] Other (See Comments)    "Knocked her out, woke up 3 days later"   Augmentin [Amoxicillin-Pot Clavulanate] Nausea And Vomiting   Avelox [Moxifloxacin] Itching and Nausea Only   Brimonidine Itching and Swelling    redness   Chlorzoxazone Other (See Comments)    Went crazy/loopy   Ciprofloxacin Hcl Itching and Nausea Only   Clavulanic Acid Other (See Comments)     Unknown   Crestor [Rosuvastatin] Other (See Comments)    myalgia   Ezetimibe Diarrhea    Stomach cramps   Gluten Meal Other (See Comments)    Diarrhea, hot/cold sweat, will sleep for a couple of hours - Celiac disease   Lansoprazole Other (See Comments)    GI intolerance   Nitrofuran Derivatives Itching and Nausea Only   Phenergan [Promethazine Hcl] Other (See Comments)    hallucinations   Wheat Other (See Comments)    Gi distress- celiac disease   Codeine Palpitations   Darvon [Propoxyphene Hcl] Palpitations   Doxycycline Itching and Nausea Only    OBJECTIVE: Blood pressure (!) 171/73, pulse 63, temperature 97.8 F (36.6 C),  temperature source Oral, resp. rate 18, height 5\' 3"  (1.6 m), weight 60.3 kg, SpO2 96%.  Physical Exam Constitutional:      General: She is not in acute distress.    Appearance: Normal appearance. She is well-developed. She is not ill-appearing or diaphoretic.  HENT:     Head: Normocephalic and atraumatic.     Right Ear: Hearing and external ear normal.     Left Ear: Hearing and external ear normal.     Nose: No nasal deformity or rhinorrhea.  Eyes:     General: No scleral icterus.    Conjunctiva/sclera: Conjunctivae normal.     Right eye: Right conjunctiva is not injected.  Left eye: Left conjunctiva is not injected.     Pupils: Pupils are equal, round, and reactive to light.  Neck:     Vascular: No JVD.  Cardiovascular:     Rate and Rhythm: Normal rate and regular rhythm.     Heart sounds: S1 normal and S2 normal.  Pulmonary:     Effort: No respiratory distress.     Breath sounds: No wheezing.  Abdominal:     General: There is no distension.     Palpations: Abdomen is soft.  Musculoskeletal:        General: Normal range of motion.     Right shoulder: Normal.     Left shoulder: Normal.     Cervical back: Normal range of motion and neck supple.     Right hip: Normal.     Left hip: Normal.     Right knee: Normal.     Left knee: Normal.  Lymphadenopathy:     Head:     Right side of head: No submandibular, preauricular or posterior auricular adenopathy.     Left side of head: No submandibular, preauricular or posterior auricular adenopathy.     Cervical: No cervical adenopathy.     Right cervical: No superficial or deep cervical adenopathy.    Left cervical: No superficial or deep cervical adenopathy.  Skin:    General: Skin is warm and dry.     Coloration: Skin is not pale.     Findings: No abrasion, bruising, ecchymosis, erythema, lesion or rash.     Nails: There is no clubbing.  Neurological:     Mental Status: She is alert and oriented to person, place, and time.      Sensory: No sensory deficit.     Coordination: Coordination normal.     Gait: Gait normal.  Psychiatric:        Attention and Perception: She is attentive.        Mood and Affect: Mood normal.        Speech: Speech normal.        Behavior: Behavior normal. Behavior is cooperative.        Thought Content: Thought content normal.        Judgment: Judgment normal.     Lab Results Lab Results  Component Value Date   WBC 5.2 07/08/2023   HGB 10.0 (L) 07/08/2023   HCT 32.7 (L) 07/08/2023   MCV 98.8 07/08/2023   PLT 226 07/08/2023    Lab Results  Component Value Date   CREATININE 1.09 (H) 07/08/2023   BUN 35 (H) 07/08/2023   NA 132 (L) 07/08/2023   K 4.2 07/08/2023   CL 103 07/08/2023   CO2 23 07/08/2023    Lab Results  Component Value Date   ALT 36 06/23/2023   AST 73 (H) 06/23/2023   ALKPHOS 59 06/23/2023   BILITOT 0.7 06/23/2023     Microbiology: Recent Results (from the past 240 hours)  Blood culture (routine x 2)     Status: None (Preliminary result)   Collection Time: 07/04/23 10:09 AM   Specimen: BLOOD  Result Value Ref Range Status   Specimen Description   Final    BLOOD RIGHT ANTECUBITAL Performed at [facility], [address] [place], [place] [address]    Special Requests   Final    BOTTLES DRAWN AEROBIC AND ANAEROBIC Blood Culture results may not be optimal due to an inadequate volume of blood received in culture bottles Performed at  [facility], [address] [place], [place] [address]    Culture   Final    NO GROWTH 4 DAYS Performed at [facility], [address], [place], [place] [address]    Report Status PENDING  Incomplete    Acey Lav, MD [facility] for Infectious Disease [facility] [phone] pager  07/08/2023, 10:00 AM

## 2023-07-08 NOTE — Plan of Care (Signed)
 ?  Problem: Clinical Measurements: ?Goal: Will remain free from infection ?Outcome: Progressing ?  ?

## 2023-07-08 NOTE — Consult Note (Signed)
 duplicate

## 2023-07-08 NOTE — Progress Notes (Signed)
 Pharmacy Antibiotic Note  Amy Hickman is a 88 y.o. female admitted on 07/04/2023 with suprapubic pain, back pain, and intermittent fever. Patient with history of recurrent Klebsiella UTIs, followed by [facility]. Patient was seen in the ED 3/5 with abdominal pain, given a dose of fosfomycin and discharged. Urine culture from that visit with ESBL K.pneumo. Pharmacy has been consulted for meropenem dosing.  Plan: -Continue meropenem 1 g IV q12h based on current renal function -Continue to follow clinical progress for dose adjustments and de-escalation as indicated -ID consulted for 3/20 - f/u recs -Pharmacy to sign off consult at this time  Height: 5\' 3"  (160 cm) Weight: 60.3 kg (132 lb 15 oz) IBW/kg (Calculated) : 52.4  Temp (24hrs), Avg:97.8 F (36.6 C), Min:97.6 F (36.4 C), Max:98 F (36.7 C)  Recent Labs  Lab 07/04/23 1003 07/05/23 0406 07/06/23 0448 07/07/23 0517 07/08/23 0502  WBC 6.3 5.2 5.5 5.8 5.2  CREATININE 1.30* 1.24* 1.05* 1.25* 1.09*    Estimated Creatinine Clearance: 31.2 mL/min (A) (by C-G formula based on SCr of 1.09 mg/dL (H)).    Allergies  Allergen Reactions   Cephalexin Other (See Comments)    Abdominal pain , GI distress, Abdominal pain , GI distress   Hydrocodone-Acetaminophen Other (See Comments) and Rash    Palpitations, flushing, hyperactivity, Chest pain, Chest pain   Ativan [Lorazepam] Other (See Comments)    "Knocked her out, woke up 3 days later"   Augmentin [Amoxicillin-Pot Clavulanate] Nausea And Vomiting   Avelox [Moxifloxacin] Itching and Nausea Only   Brimonidine Itching and Swelling    redness   Chlorzoxazone Other (See Comments)    Went crazy/loopy   Ciprofloxacin Hcl Itching and Nausea Only   Clavulanic Acid Other (See Comments)     Unknown   Crestor [Rosuvastatin] Other (See Comments)    myalgia   Ezetimibe Diarrhea    Stomach cramps   Gluten Meal Other (See Comments)    Diarrhea, hot/cold sweat, will sleep for a couple  of hours - Celiac disease   Lansoprazole Other (See Comments)    GI intolerance   Nitrofuran Derivatives Itching and Nausea Only   Phenergan [Promethazine Hcl] Other (See Comments)    hallucinations   Wheat Other (See Comments)    Gi distress- celiac disease   Codeine Palpitations   Darvon [Propoxyphene Hcl] Palpitations   Doxycycline Itching and Nausea Only    Antimicrobials this admission: Meropenem 3/16 >> Cefepime 3/16 x 1  Dose adjustments this admission: NA  Microbiology results: 3/16 BCx: ngtd  From ED visit 06/23/23: UCx: >100k ESBL K.pneumo (R-amp, cefazolin, ceftriaxone, cipro, bactrim)    Thank you for allowing pharmacy to be a part of this patient's care.  Pricilla Riffle, PharmD, BCPS Clinical Pharmacist 07/08/2023 7:30 AM

## 2023-07-09 DIAGNOSIS — N390 Urinary tract infection, site not specified: Secondary | ICD-10-CM | POA: Diagnosis not present

## 2023-07-09 DIAGNOSIS — B9689 Other specified bacterial agents as the cause of diseases classified elsewhere: Secondary | ICD-10-CM | POA: Diagnosis not present

## 2023-07-09 LAB — CULTURE, BLOOD (ROUTINE X 2): Culture: NO GROWTH

## 2023-07-09 MED ORDER — CYANOCOBALAMIN 500 MCG PO TABS
500.0000 ug | ORAL_TABLET | Freq: Every day | ORAL | 0 refills | Status: DC
Start: 2023-07-10 — End: 2023-08-25

## 2023-07-09 MED ORDER — FOLIC ACID 1 MG PO TABS: 1.0000 mg | ORAL_TABLET | Freq: Every day | ORAL | 0 refills | Status: DC

## 2023-07-09 NOTE — Progress Notes (Signed)
 Patient's daughter arrived to the hospital to transport her home.

## 2023-07-09 NOTE — Evaluation (Signed)
 Physical Therapy Evaluation Patient Details Name: Amy Hickman MRN: [id number] DOB: 1935-05-24 Today's Date: 07/09/2023  History of Present Illness  88 y/o female admitted on 07/04/23 with suprapubic pain, back pain dysuria and fevers. Past medical history significant for CAD, A-fib on Eliquis, chronic heart failure preserved ejection fraction, history of recurrent DVTs, interstitial cystitis, recurrent UTIs, hypertension, hypothyroidism, chronic constipation  Clinical Impression  Pt admitted with above diagnosis.  Pt currently with functional limitations due to the deficits listed below (see PT Problem List). Pt will benefit from acute skilled PT to increase their independence and safety with mobility to allow discharge.  Pt able to ambulate short distance in hallway and at most requiring CGA for safety.  Pt anticipates d/c home today and agreeable to HHPT.         If plan is discharge home, recommend the following: Assistance with cooking/housework;Assist for transportation;Help with stairs or ramp for entrance   Can travel by private vehicle        Equipment Recommendations None recommended by PT  Recommendations for Other Services       Functional Status Assessment Patient has had a recent decline in their functional status and demonstrates the ability to make significant improvements in function in a reasonable and predictable amount of time.     Precautions / Restrictions Precautions Precautions: Fall      Mobility  Bed Mobility Overal bed mobility: Needs Assistance Bed Mobility: Supine to Sit, Sit to Supine     Supine to sit: Supervision Sit to supine: Supervision        Transfers Overall transfer level: Needs assistance Equipment used: Rolling walker (2 wheels) Transfers: Sit to/from Stand Sit to Stand: Supervision           General transfer comment: keeps hands on RW but pt typically uses rollator and brakes    Ambulation/Gait Ambulation/Gait  assistance: Contact guard assist Gait Distance (Feet): 40 Feet Assistive device: Rolling walker (2 wheels) Gait Pattern/deviations: Step-through pattern, Decreased stride length, Trunk flexed Gait velocity: decr     General Gait Details: cues for posture, slow but steady pace, pt reports fatigue and generalized weakness limiting distance  Stairs            Wheelchair Mobility     Tilt Bed    Modified Rankin (Stroke Patients Only)       Balance Overall balance assessment: Needs assistance         Standing balance support: No upper extremity supported Standing balance-Leahy Scale: Fair                               Pertinent Vitals/Pain Pain Assessment Pain Assessment: 0-10 Pain Score: 5  Pain Location: back Pain Descriptors / Indicators: Aching, Sore Pain Intervention(s): Monitored during session, Repositioned    Home Living Family/patient expects to be discharged to:: Private residence Living Arrangements: Other relatives Available Help at Discharge: Family;Available 24 hours/day (Granddaughter available 24/7.) Type of Home: House Home Access: Stairs to enter Entrance Stairs-Rails: None Entrance Stairs-Number of Steps: 1 Alternate Level Stairs-Number of Steps: Chair lift in between floors and pt usually stays upstairs. Home Layout: Multi-level Home Equipment: Shower seat;Grab bars - tub/shower;Hand held shower head;Grab bars - toilet;Toilet riser;Cane - single point;Rollator (4 wheels);Electric scooter;Adaptive equipment;BSC/3in1 Additional Comments: Pt's bro-in-law is currently using her scooter as she has not needed it. Has an adjustable bed but does not sleep in it.    Prior Function  Prior Level of Function : Independent/Modified Independent;Needs assist       Physical Assist : ADLs (physical)   ADLs (physical): IADLs Mobility Comments: denies falls in the past few months. does not drive due to macular degeneration. very active with  senior group at church.  reports always using rollator ADLs Comments: Ind with all BADLs. Uses the pop-up heel slip on sneakers.  Granddaughter does IADLs.     Extremity/Trunk Assessment   Upper Extremity Assessment Upper Extremity Assessment: Right hand dominant;LUE deficits/detail LUE Deficits / Details: Pt reports chronic hand numbness. LUE Sensation: decreased light touch    Lower Extremity Assessment Lower Extremity Assessment: Generalized weakness       Communication   Communication Communication: Impaired Factors Affecting Communication: Hearing impaired    Cognition Arousal: Alert Behavior During Therapy: WFL for tasks assessed/performed   PT - Cognitive impairments: No apparent impairments                         Following commands: Intact       Cueing       General Comments      Exercises     Assessment/Plan    PT Assessment Patient needs continued PT services  PT Problem List Decreased strength;Decreased activity tolerance;Decreased mobility;Decreased balance;Decreased knowledge of use of DME       PT Treatment Interventions DME instruction;Gait training;Balance training;Stair training;Functional mobility training;Therapeutic exercise;Therapeutic activities;Patient/family education    PT Goals (Current goals can be found in the Care Plan section)  Acute Rehab PT Goals PT Goal Formulation: With patient Time For Goal Achievement: 07/16/23 Potential to Achieve Goals: Good    Frequency Min 2X/week     Co-evaluation               AM-PAC PT "6 Clicks" Mobility  Outcome Measure Help needed turning from your back to your side while in a flat bed without using bedrails?: A Little Help needed moving from lying on your back to sitting on the side of a flat bed without using bedrails?: A Little Help needed moving to and from a bed to a chair (including a wheelchair)?: A Little Help needed standing up from a chair using your arms (e.g.,  wheelchair or bedside chair)?: A Little Help needed to walk in hospital room?: A Little Help needed climbing 3-5 steps with a railing? : A Little 6 Click Score: 18    End of Session Equipment Utilized During Treatment: Gait belt Activity Tolerance: Patient tolerated treatment well Patient left: in bed;with call bell/phone within reach;with bed alarm set   PT Visit Diagnosis: Difficulty in walking, not elsewhere classified (R26.2)    Time: 4098-1191 PT Time Calculation (min) (ACUTE ONLY): 15 min   Charges:   PT Evaluation $PT Eval Low Complexity: 1 Low   PT General Charges $$ ACUTE PT VISIT: 1 Visit        Paulino Door, DPT Physical Therapist Acute Rehabilitation Services Office: [phone]   Janan Halter [place] 07/09/2023, 12:25 PM

## 2023-07-09 NOTE — Discharge Summary (Signed)
 Physician Discharge Summary   Patient: Amy Hickman MRN: [id number] DOB: Oct 31, 1935  Admit date:     07/04/2023  Discharge date: 07/09/23  Discharge Physician: Alba Cory   PCP: Cheron Schaumann., MD   Recommendations at discharge:    Follow up with ID [id number]  Resume follow up with urology   Discharge Diagnoses: Principal Problem:   Urinary tract infection due to ESBL Klebsiella Active Problems:   Chronic heart failure with mildly reduced ejection fraction (HFmrEF, 41-49%) (HCC)   Hypothyroidism   Coronary artery disease involving native coronary artery of native heart without angina pectoris   Persistent atrial fibrillation (HCC)   COPD (chronic obstructive pulmonary disease) (HCC)   CKD stage 3b, GFR 30-44 ml/min (HCC)  Resolved Problems:   * No resolved hospital problems. *  Hospital Course: 88 year old with past medical history significant for CAD, A-fib on Eliquis, chronic heart failure preserved ejection fraction, history of recurrent DVTs, interstitial cystitis, recurrent UTIs, hypertension, hypothyroidism, chronic constipation who presented with suprapubic pain, back pain dysuria and fevers.   She has been recently treated with oral antibiotic as an outpatient and intravesicular gentamicin instillation and recent fosfomycin, she presented with persistent fevers, suprapubic pain.  Urine culture on 06/23/2023 was growing ESBL Klebsiella.  CT demonstrated stool retention, colonic distention, gas in the urinary bladder.    Assessment and Plan: 1-ESBL UTI: Urinalysis no significantly positive for infection but she does describe pyuria and presented with persistent fever.  Urine culture was not collected on admission.  She has been started on meropenem and continue. ID consulted, recommended to complete 5 days of antibiotics, she has recievd 4 days Meropenem and one day today of etarnepenem.     A fib : Continue amiodarone Coreg diltiazem and Eliquis   Chronic  heart failure preserved ejection fraction: appears compensated   CAD: Denies chest pain   Hypertension: Continue with hydralazine Coreg diltiazem and isosorbide    Chronic constipation: Describes frequent bowel movement yesterday. Continue with prucalopride, plecanatide.   having BM  Hyponatremia; appears dehydrated. improved   Estimated body mass index is 23.55 kg/m as calculated from the following:   Height as of this encounter: 5\' 3"  (1.6 m).   Weight as of this encounter: 60.3 kg.   Stable for discharge, no changes in medical condition        Consultants: ID Procedures performed: none  Disposition: Home Diet recommendation:  Discharge Diet Orders (From admission, onward)     Start     Ordered   07/08/23 0000  Diet - low sodium heart healthy        07/08/23 1502           Cardiac diet DISCHARGE MEDICATION: Allergies as of 07/09/2023       Reactions   Cephalexin Other (See Comments)   Abdominal pain , GI distress, Abdominal pain , GI distress   Hydrocodone-acetaminophen Other (See Comments), Rash   Palpitations, flushing, hyperactivity, Chest pain, Chest pain   Ativan [lorazepam] Other (See Comments)   "Knocked her out, woke up 3 days later"   Augmentin [amoxicillin-pot Clavulanate] Nausea And Vomiting   Avelox [moxifloxacin] Itching, Nausea Only   Brimonidine Itching, Swelling   redness   Chlorzoxazone Other (See Comments)   Went crazy/loopy   Ciprofloxacin Hcl Itching, Nausea Only   Clavulanic Acid Other (See Comments)    Unknown   Crestor [rosuvastatin] Other (See Comments)   myalgia   Ezetimibe Diarrhea   Stomach cramps   Gluten  Meal Other (See Comments)   Diarrhea, hot/cold sweat, will sleep for a couple of hours - Celiac disease   Lansoprazole Other (See Comments)   GI intolerance   Nitrofuran Derivatives Itching, Nausea Only   Phenergan [promethazine Hcl] Other (See Comments)   hallucinations   Wheat Other (See Comments)   Gi distress-  celiac disease   Codeine Palpitations   Darvon [propoxyphene Hcl] Palpitations   Doxycycline Itching, Nausea Only        Medication List     STOP taking these medications    cefdinir 300 MG capsule Commonly known as: OMNICEF   estradiol 0.1 MG/GM vaginal cream Commonly known as: ESTRACE   furosemide 40 MG tablet Commonly known as: LASIX       TAKE these medications    acetaminophen 500 MG tablet Commonly known as: TYLENOL Take 1 tablet (500 mg total) by mouth every 6 (six) hours as needed for pain. What changed:  how much to take when to take this reasons to take this   amiodarone 200 MG tablet Commonly known as: PACERONE Take 1 tablet (200 mg total) by mouth daily.   Artificial Tears 1.4 % ophthalmic solution Generic drug: polyvinyl alcohol Place 1 drop into both eyes as needed for dry eyes.   carvedilol 12.5 MG tablet Commonly known as: COREG Take 1 tablet (12.5 mg total) by mouth 2 (two) times daily.   clidinium-chlordiazePOXIDE 5-2.5 MG capsule Commonly known as: LIBRAX Take 2 capsules by mouth daily as needed (cramping).   conjugated estrogens vaginal cream Commonly known as: PREMARIN Place 1 Applicatorful vaginally at bedtime for 7 days, THEN 1 Applicatorful 2 (two) times a week for 7 days. Start taking on: July 08, 2023   cyanocobalamin 500 MCG tablet Commonly known as: VITAMIN B12 Take 1 tablet (500 mcg total) by mouth daily. Start taking on: July 10, 2023   CYSTEX PO Take 1 Capful by mouth daily.   diltiazem 360 MG 24 hr capsule Commonly known as: CARDIZEM CD Take 1 capsule (360 mg total) by mouth daily.   Eliquis 5 MG Tabs tablet Generic drug: apixaban TAKE 1 TABLET TWICE A DAY   esomeprazole 40 MG capsule Commonly known as: NEXIUM Take 40 mg by mouth daily before breakfast.   fenofibrate 54 MG tablet Take 54 mg by mouth every evening.   folic acid 1 MG tablet Commonly known as: FOLVITE Take 1 tablet (1 mg total) by mouth  daily. Start taking on: July 10, 2023   hydrALAZINE 100 MG tablet Commonly known as: APRESOLINE TAKE 1 TABLET THREE TIMES A DAY   isosorbide dinitrate 10 MG tablet Commonly known as: ISORDIL Take 10 mg by mouth 2 (two) times daily.   latanoprost 0.005 % ophthalmic solution Commonly known as: XALATAN Place 1 drop into both eyes at bedtime.   levothyroxine 112 MCG tablet Commonly known as: SYNTHROID Take 112 mcg by mouth daily before breakfast.   Motegrity 2 MG Tabs Generic drug: Prucalopride Succinate TAKE 1 TABLET(2 MG) BY MOUTH DAILY What changed:  how much to take how to take this when to take this additional instructions   ondansetron 4 MG tablet Commonly known as: ZOFRAN TAKE 1 TABLET EVERY 8 HOURS AS NEEDED FOR NAUSEA OR VOMITING What changed: See the new instructions.   Oscimin 0.125 MG Subl Generic drug: Hyoscyamine Sulfate SL Take 0.125 mg by mouth as needed (cramping).   pilocarpine 1 % ophthalmic solution Commonly known as: PILOCAR Place 1 drop into the right eye  2 (two) times daily.   polyethylene glycol 17 g packet Commonly known as: MIRALAX / GLYCOLAX Take 17 g by mouth 2 (two) times daily.   Trulance 3 MG Tabs Generic drug: Plecanatide Take 1 tablet by mouth daily.   Vitamin D (Ergocalciferol) 1.25 MG (50000 UNIT) Caps capsule Commonly known as: DRISDOL Take 50,000 Units by mouth every 7 (seven) days.        Discharge Exam: Filed Weights   07/04/23 0948  Weight: 60.3 kg   General; NAD  Condition at discharge: stable  The results of significant diagnostics from this hospitalization (including imaging, microbiology, ancillary and laboratory) are listed below for reference.   Imaging Studies: CT ABDOMEN PELVIS W CONTRAST Result Date: 07/04/2023 CLINICAL DATA:  Abdominal/flank pain with stone suspected EXAM: CT ABDOMEN AND PELVIS WITH CONTRAST TECHNIQUE: Multidetector CT imaging of the abdomen and pelvis was performed using the standard  protocol following bolus administration of intravenous contrast. RADIATION DOSE REDUCTION: This exam was performed according to the departmental dose-optimization program which includes automated exposure control, adjustment of the mA and/or kV according to patient size and/or use of iterative reconstruction technique. CONTRAST:  60mL OMNIPAQUE IOHEXOL 300 MG/ML  SOLN COMPARISON:  Eleven days prior FINDINGS: Lower chest: Cardiomegaly. Coronary and aortic atherosclerosis. Suspect prior anti reflux procedure with clips around the esophageal hiatus. Hepatobiliary: Low density at the portal triads, circumferential and favored periportal edema. This was also seen on prior. There is cholecystectomy without common bile duct dilatation.No focal liver lesion. Pancreas: No acute finding. Spleen: Unremarkable. Adrenals/Urinary Tract: Negative adrenals. No hydronephrosis or stone. Relative right renal atrophy. Air within the urinary bladder which is pronounced in volume, no fistula seen or air on CT from last year. Stomach/Bowel: Diffuse stool distended colon but sparing the rectum, pattern similar to before. No small bowel obstruction or inflammation is seen. There is an anastomosis in the left abdomen involving small bowel. Vascular/Lymphatic: Extensive atheromatous calcification of the aorta and branch vessels. There is an IVC filter in stable position with legs extending beyond the caval wall. No mass or adenopathy. Reproductive:Hysterectomy Other: No ascites or pneumoperitoneum. Musculoskeletal: Advanced lumbar spine degeneration with scoliosis. Advanced bilateral hip osteoarthritis. No acute finding. IMPRESSION: 1. Continued generalized stool retention and colonic distension. 2. Continued prominent volume of bladder gas, correlate with urinalysis. 3. Chronic findings as described. Electronically Signed   By: Tiburcio Pea M.D.   On: 07/04/2023 11:59   CT ABDOMEN PELVIS W CONTRAST Result Date: 06/23/2023 CLINICAL DATA:   Abdominal pain. EXAM: CT ABDOMEN AND PELVIS WITH CONTRAST TECHNIQUE: Multidetector CT imaging of the abdomen and pelvis was performed using the standard protocol following bolus administration of intravenous contrast. RADIATION DOSE REDUCTION: This exam was performed according to the departmental dose-optimization program which includes automated exposure control, adjustment of the mA and/or kV according to patient size and/or use of iterative reconstruction technique. CONTRAST:  80mL OMNIPAQUE IOHEXOL 300 MG/ML  SOLN COMPARISON:  January 20, 2023 FINDINGS: Lower chest: No acute abnormality. Hepatobiliary: No focal liver abnormality is seen. Status post cholecystectomy. No biliary dilatation. Pancreas: Unremarkable. No pancreatic ductal dilatation or surrounding inflammatory changes. Spleen: Normal in size without focal abnormality. Adrenals/Urinary Tract: Adrenal glands are unremarkable. Kidneys are normal, without renal calculi, focal lesion, or hydronephrosis. A mild-to-moderate amount of air is seen within the urinary bladder lumen. Stomach/Bowel: Surgical clips are seen along the gastroesophageal junction. The appendix is surgically absent. A very large stool burden is noted. A redundant sigmoid colon is seen. Surgically anastomosed  bowel is noted within the posterior aspect of the pelvis on the right. No evidence of bowel wall thickening, distention, or inflammatory changes. Vascular/Lymphatic: Aortic atherosclerosis. An inferior vena cava filter is present. No enlarged abdominal or pelvic lymph nodes. Reproductive: Status post hysterectomy. No adnexal masses. Other: No abdominal wall hernia or abnormality. No abdominopelvic ascites. Musculoskeletal: There is moderate severity levoscoliosis of the lumbar spine with marked severity multilevel degenerative changes. IMPRESSION: 1. Very large stool burden without evidence of bowel obstruction. 2. Mild-to-moderate amount of air within the urinary bladder lumen,  which may be secondary to recent instrumentation. 3. Evidence of prior cholecystectomy, appendectomy and hysterectomy. 4. Moderate severity levoscoliosis of the lumbar spine with marked severity multilevel degenerative changes. 5. Aortic atherosclerosis. Aortic Atherosclerosis (ICD10-I70.0). Electronically Signed   By: Aram Candela M.D.   On: 06/23/2023 21:50    Microbiology: Results for orders placed or performed during the hospital encounter of 07/04/23  Blood culture (routine x 2)     Status: None   Collection Time: 07/04/23 10:09 AM   Specimen: BLOOD  Result Value Ref Range Status   Specimen Description   Final    BLOOD RIGHT ANTECUBITAL Performed at [facility], [address] [place], [place] [address]    Special Requests   Final    BOTTLES DRAWN AEROBIC AND ANAEROBIC Blood Culture results may not be optimal due to an inadequate volume of blood received in culture bottles Performed at [facility], [address] [place], [place] [address]    Culture   Final    NO GROWTH 5 DAYS Performed at [facility], [address], [place], [place] [address]    Report Status 07/09/2023 FINAL  Final    Labs: CBC: Recent Labs  Lab 07/04/23 1003 07/05/23 0406 07/06/23 0448 07/07/23 0517 07/08/23 0502  WBC 6.3 5.2 5.5 5.8 5.2  NEUTROABS 4.4  --   --   --   --   HGB 11.1* 10.3* 11.2* 10.6* 10.0*  HCT 34.4* 33.4* 36.0 33.3* 32.7*  MCV 94.2 98.5 96.8 97.1 98.8  PLT 278 235 241 243 226   Basic Metabolic Panel: Recent Labs  Lab 07/04/23 1003 07/05/23 0406 07/06/23 0448 07/07/23 0517 07/08/23 0502  NA 133* 133* 131* 129* 132*  K 4.3 4.6 4.6 4.9 4.2  CL 102 103 99 101 103  CO2 23 24 24  21* 23  GLUCOSE 94 93 105* 98 93  BUN 29* 24* 20 33* 35*  CREATININE 1.30* 1.24* 1.05* 1.25* 1.09*  CALCIUM 9.3 8.7* 9.4 9.6 9.1   Liver Function Tests: No results for input(s): "AST", "ALT", "ALKPHOS", "BILITOT", "PROT", "ALBUMIN" in the last 168  hours. CBG: No results for input(s): "GLUCAP" in the last 168 hours.  Discharge time spent: greater than 30 minutes.  Signed: Alba Cory, MD [facility] 07/09/2023

## 2023-07-09 NOTE — Evaluation (Signed)
 Occupational Therapy Evaluation Patient Details Name: Amy Hickman MRN: [id number] DOB: 1935-06-14 Today's Date: 07/09/2023   History of Present Illness   88 y/o female who  who presented to [facility] on 07/04/23 with suprapubic pain, back pain dysuria and fevers. Past medical history significant for CAD, A-fib on Eliquis, chronic heart failure preserved ejection fraction, history of recurrent DVTs, interstitial cystitis, recurrent UTIs, hypertension, hypothyroidism, chronic constipation     Clinical Impressions Patient is currently requiring as high as Contact guard assistance with basic ADLs (Other than her baseline assist from granddaughter at home for hair, makeup due to low vision, as well as Contact guard assist with functional transfers to comfort height toilet due to mild eccentric loss of control with use of RW for ambulation and grab bar for toilet transfer.   Current level of function is a little below patient's typical baseline.    During this evaluation, patient was limited by generalized weakness, impaired activity tolerance, and baseline low vision, all of which has the potential to impact patient's and/or caregivers' safety and independence during functional mobility, as well as performance for ADLs.    Patient lives with her granddaughter who  able to provide 24/7 supervision and assistance.  Patient demonstrates very good rehab potential, and should benefit from continued skilled occupational therapy services while in acute care to maximize safety, independence and quality of life at home.  Continued LOW VISION occupational therapy services are recommended for the home. Pt agreeable to this as well as restarting HH PT to work on return to full baseline when she could climb the stairs in her home, going sideways, holding one rail.  ?      If plan is discharge home, recommend the following:   A little help with bathing/dressing/bathroom;Help with stairs or ramp for  entrance;Assist for transportation;Assistance with cooking/housework     Functional Status Assessment   Patient has had a recent decline in their functional status and demonstrates the ability to make significant improvements in function in a reasonable and predictable amount of time.     Equipment Recommendations         Recommendations for Other Services         Precautions/Restrictions   Precautions Precautions: Fall Restrictions Weight Bearing Restrictions Per Provider Order: No     Mobility Bed Mobility Overal bed mobility: Needs Assistance Bed Mobility: Sit to Supine       Sit to supine: Supervision   General bed mobility comments: Some difficulty controlling her trunk with sit to supine, but able to adjust, move to long sitting and scoot self up to [facility].    Transfers                          Balance Overall balance assessment: Needs assistance   Sitting balance-Leahy Scale: Good     Standing balance support: During functional activity, Bilateral upper extremity supported Standing balance-Leahy Scale: Fair (Able to release sink for hand hygiene without LOB.)                             ADL either performed or assessed with clinical judgement   ADL Overall ADL's : Needs assistance/impaired Eating/Feeding: Independent   Grooming: Wash/dry hands;Standing;Supervision/safety   Upper Body Bathing: Supervision/ safety;Sitting   Lower Body Bathing: Supervison/ safety;Sitting/lateral leans   Upper Body Dressing : Set up;Sitting   Lower Body Dressing: Sit to/from stand;Supervision/safety Lower Body  Dressing Details (indicate cue type and reason): Pt able to stand and doff/don pants over hips for toileting with SBA. Doffed socks at EOB without assistance. Toilet Transfer: Contact guard assist;Comfort height toilet;Rolling walker (2 wheels);Ambulation Toilet Transfer Details (indicate cue type and reason): Pt stood from recliner to  RW with CGA. Pt ambulated to/from bathroom ~12' each with RW and CGA. Mild eccentric control impairement lowering to toilet witn use of LT grab bar with CGA for safety. Toileting- Clothing Manipulation and Hygiene: Supervision/safety;Sitting/lateral lean;Sit to/from stand       Functional mobility during ADLs: Supervision/safety;Contact guard assist;Rolling walker (2 wheels)       Vision Baseline Vision/History: 1 Wears glasses;3 Glaucoma;6 Macular Degeneration Additional Comments: LT eye blindness due to macular degeneration. RT eye "okay" but compensates to use peripheral vision.     Perception         Praxis         Pertinent Vitals/Pain Pain Assessment Pain Assessment: 0-10 Pain Score: 5  Pain Location: "Normal". Pt reports chronic back pain. Pain Intervention(s): Limited activity within patient's tolerance, Monitored during session, Premedicated before session, Repositioned     Extremity/Trunk Assessment Upper Extremity Assessment Upper Extremity Assessment: Right hand dominant;LUE deficits/detail LUE Deficits / Details: Pt reports chronic hand numbness. LUE Sensation: decreased light touch           Communication Communication Factors Affecting Communication: Hearing impaired (B hearing aids in place,)   Cognition Arousal: Alert Behavior During Therapy: WFL for tasks assessed/performed Cognition: No apparent impairments                                       Cueing  General Comments          Exercises     Shoulder Instructions      Home Living Family/patient expects to be discharged to:: Private residence Living Arrangements: Other relatives Available Help at Discharge: Family;Available 24 hours/day (Granddaughter available 24/7.) Type of Home: House Home Access: Stairs to enter [employer] of Steps: 1 Entrance Stairs-Rails: None Home Layout: Multi-level Alternate Level Stairs-Number of Steps: Chair lift in between  floors and pt usually stays upstairs.   Bathroom Shower/Tub: Producer, television/film/video: Handicapped height (due to riser) Bathroom Accessibility: Yes How Accessible: Accessible via walker Home Equipment: Shower seat;Grab bars - tub/shower;Hand held shower head;Grab bars - toilet;Toilet riser;Cane - single point;Rollator (4 wheels);Electric scooter;Adaptive equipment;BSC/3in1 Adaptive Equipment: Reacher Additional Comments: Pt's bro-in-law is currently using her scooter as she has not needed it. Has an adjustable bed but does not sleep in it.  Pt keeps on Rollator on one floor and 2nd one on top floor for ease.       Prior Functioning/Environment Prior Level of Function : Independent/Modified Independent;Needs assist       Physical Assist : ADLs (physical)   ADLs (physical): IADLs Mobility Comments: denies falls in the past few months. does not drive due to macular degeneration. very active with senior group at church. Granddaughter does IADLs. ADLs Comments: Ind with all BADLs. Uses the pop-up heel slip on sneakers.    OT Problem List: Pain;Impaired vision/perception;Decreased activity tolerance   OT Treatment/Interventions: Self-care/ADL training;Therapeutic activities;Visual/perceptual remediation/compensation;Therapeutic exercise;Energy conservation;Patient/family education;Balance training      OT Goals(Current goals can be found in the care plan section)   Acute Rehab OT Goals Patient Stated Goal: Increase strength and endurance to be able to go up  and down the stairs again. OT Goal Formulation: With patient Time For Goal Achievement: 07/23/23 Potential to Achieve Goals: Good ADL Goals Pt/caregiver will Perform Home Exercise Program: Increased strength;Both right and left upper extremity;Independently Additional ADL Goal #1: Patient will identify at least 3 energy conservation strategies to employ at home in order to maximize function and quality of life and  decrease caregiver burden while preventing exacerbation of symptoms and rehospitalization. Additional ADL Goal #2: Pt will engage in 20 min functional activities without loss of sitting or standing balance, in order to demonstrate improved activity tolerance and balance needed to perform ADLs safely at home. Additional ADL Goal #3: Pt will complete the 30 s econd sit to stand chair test, modified for use of UEs if needed, and score at least an 8 with VSS, to show a low risk of falls.   OT Frequency:  Min 1X/week    Co-evaluation              AM-PAC OT "6 Clicks" Daily Activity     Outcome Measure Help from another person eating meals?: None Help from another person taking care of personal grooming?: A Little Help from another person toileting, which includes using toliet, bedpan, or urinal?: A Little Help from another person bathing (including washing, rinsing, drying)?: A Little Help from another person to put on and taking off regular upper body clothing?: A Little Help from another person to put on and taking off regular lower body clothing?: A Little 6 Click Score: 19   End of Session Equipment Utilized During Treatment: Rolling walker (2 wheels) Nurse Communication: Mobility status  Activity Tolerance: Patient limited by fatigue Patient left: in bed;with call bell/phone within reach;with bed alarm set  OT Visit Diagnosis: Muscle weakness (generalized) (M62.81);Low vision, both eyes (H54.2);Pain Pain - part of body:  (chronic back pain always a 4-5/10)                Time: 4098-1191 OT Time Calculation (min): 35 min Charges:  OT General Charges $OT Visit: 1 Visit OT Evaluation $OT Eval Low Complexity: 1 Low OT Treatments $Self Care/Home Management : 8-22 mins  Victorino Dike, OT Acute Rehab Services Office: [phone] 07/09/2023   Theodoro Clock 07/09/2023, 9:24 AM

## 2023-07-09 NOTE — Progress Notes (Signed)
 Patient discharged home, IV removed, discharge paperwork completed and explained to patient, patient verbalized understanding, awaiting for patient's daughter to arrive and transport her home.

## 2023-07-09 NOTE — Plan of Care (Signed)
   Problem: Clinical Measurements: Goal: Will remain free from infection Outcome: Progressing Goal: Diagnostic test results will improve Outcome: Progressing

## 2023-07-16 ENCOUNTER — Encounter: Payer: Self-pay | Admitting: Internal Medicine

## 2023-07-27 NOTE — Progress Notes (Deleted)
 Chief Complaint: follow up of chronic constipation Primary GI MD: Dr. Rhea Belton  HPI: 88 year old female with a history of celiac disease, around a year of constipation and obstipation, prior abdominal surgeries including repair of intussusception as a child, appendectomy, hysterectomy, lysis of adhesions, history of CHF, CAD, A-fib on Eliquis, hyperparathyroidism, hypertension and ICD who is here for follow-up   Last seen 12/2022 by Dr. Rhea Belton for ongoing constipation/obstipation.  No obstructive process seen on cross-sectional imaging 06/2022.  Linzess, Amitiza, Trulance incompletely effective.  Patient was given samples of Motegrity 2 Mg daily and recommended to continue MiraLAX twice daily.  CTAP wo contrast 01/2023: Mild gastric wall thickening.  Moderate colonic stool burden.  3 mm left renal calculus. CT abdomen pelvis with contrast 06/23/2023 for abdominal pain: Very large stool burden without evidence of obstruction. CT abdomen pelvis with contrast 07/04/2023: Continued generalized stool retention and colonic distention.  Prominent volume of bladder BX.  TSH 05/27/2023 9.991  Discussed the use of AI scribe software for clinical note transcription with the patient, who gave verbal consent to proceed.  History of Present Illness      PREVIOUS GI WORKUP     Past Medical History:  Diagnosis Date   Atrial fibrillation, chronic (HCC)    s/p multiple cardioversions with sustained NSR, on Eliquis   Celiac disease    Chronic systolic CHF (congestive heart failure) (HCC)    Coronary artery disease involving native coronary artery of native heart without angina pectoris 03/15/2021   GERD (gastroesophageal reflux disease)    HOH (hard of hearing)    Hyperparathyroidism (HCC) 12/12/2020   Hypertension    Interstitial cystitis     Past Surgical History:  Procedure Laterality Date   ABDOMINAL HYSTERECTOMY     APPENDECTOMY     CARDIOVERSION N/A 04/24/2021   Procedure: CARDIOVERSION;   Surgeon: Jake Bathe, MD;  Location: [facility] ENDOSCOPY;  Service: Cardiovascular;  Laterality: N/A;   CARDIOVERSION N/A 06/12/2021   Procedure: CARDIOVERSION;  Surgeon: Jodelle Red, MD;  Location: [facility] ENDOSCOPY;  Service: Cardiovascular;  Laterality: N/A;   CARDIOVERSION N/A 08/11/2021   Procedure: CARDIOVERSION;  Surgeon: Meriam Sprague, MD;  Location: [facility] ENDOSCOPY;  Service: Cardiovascular;  Laterality: N/A;   CHOLECYSTECTOMY     ear drum surgery     REPLACEMENT TOTAL KNEE     ROTATOR CUFF REPAIR Right    TONSILLECTOMY      Current Outpatient Medications  Medication Sig Dispense Refill   acetaminophen (TYLENOL) 500 MG tablet Take 1 tablet (500 mg total) by mouth every 6 (six) hours as needed for pain. (Patient taking differently: Take 1,000 mg by mouth as needed for pain or moderate pain (pain score 4-6).) 30 tablet 0   amiodarone (PACERONE) 200 MG tablet Take 1 tablet (200 mg total) by mouth daily. 90 tablet 2   carvedilol (COREG) 12.5 MG tablet Take 1 tablet (12.5 mg total) by mouth 2 (two) times daily. 180 tablet 3   clidinium-chlordiazePOXIDE (LIBRAX) 5-2.5 MG capsule Take 2 capsules by mouth daily as needed (cramping).     cyanocobalamin (VITAMIN B12) 500 MCG tablet Take 1 tablet (500 mcg total) by mouth daily. 30 tablet 0   diltiazem (CARDIZEM CD) 360 MG 24 hr capsule Take 1 capsule (360 mg total) by mouth daily. 90 capsule 3   ELIQUIS 5 MG TABS tablet TAKE 1 TABLET TWICE A DAY 180 tablet 3   esomeprazole (NEXIUM) 40 MG capsule Take 40 mg by mouth daily before breakfast.  fenofibrate 54 MG tablet Take 54 mg by mouth every evening.     folic acid (FOLVITE) 1 MG tablet Take 1 tablet (1 mg total) by mouth daily. 30 tablet 0   hydrALAZINE (APRESOLINE) 100 MG tablet TAKE 1 TABLET THREE TIMES A DAY 270 tablet 3   Hyoscyamine Sulfate SL (OSCIMIN) 0.125 MG SUBL Take 0.125 mg by mouth as needed (cramping).     isosorbide dinitrate (ISORDIL) 10 MG tablet Take 10 mg by mouth 2  (two) times daily.     latanoprost (XALATAN) 0.005 % ophthalmic solution Place 1 drop into both eyes at bedtime.     levothyroxine (SYNTHROID) 112 MCG tablet Take 112 mcg by mouth daily before breakfast.     Methenamine-Sodium Salicylate (CYSTEX PO) Take 1 Capful by mouth daily.     ondansetron (ZOFRAN) 4 MG tablet TAKE 1 TABLET EVERY 8 HOURS AS NEEDED FOR NAUSEA OR VOMITING (Patient taking differently: Take 4 mg by mouth every 8 (eight) hours as needed for nausea or vomiting.) 20 tablet 51   pilocarpine (PILOCAR) 1 % ophthalmic solution Place 1 drop into the right eye 2 (two) times daily.     polyethylene glycol (MIRALAX / GLYCOLAX) 17 g packet Take 17 g by mouth 2 (two) times daily. 14 each 0   polyvinyl alcohol (ARTIFICIAL TEARS) 1.4 % ophthalmic solution Place 1 drop into both eyes as needed for dry eyes.     Prucalopride Succinate (MOTEGRITY) 2 MG TABS TAKE 1 TABLET(2 MG) BY MOUTH DAILY (Patient taking differently: Take 1 tablet by mouth at bedtime.) 90 tablet 3   TRULANCE 3 MG TABS Take 1 tablet by mouth daily. (Patient not taking: Reported on 07/05/2023)     Vitamin D, Ergocalciferol, (DRISDOL) 1.25 MG (50000 UNIT) CAPS capsule Take 50,000 Units by mouth every 7 (seven) days.     No current facility-administered medications for this visit.    Allergies as of 07/28/2023 - Review Complete 07/05/2023  Allergen Reaction Noted   Cephalexin Other (See Comments) 07/31/2016   Hydrocodone-acetaminophen Other (See Comments) and Rash 10/01/2011   Ativan [lorazepam] Other (See Comments) 05/01/2012   Augmentin [amoxicillin-pot clavulanate] Nausea And Vomiting 03/15/2021   Avelox [moxifloxacin] Itching and Nausea Only 03/15/2021   Brimonidine Itching and Swelling 12/31/2021   Chlorzoxazone Other (See Comments) 03/15/2021   Ciprofloxacin hcl Itching and Nausea Only 05/01/2012   Clavulanic acid Other (See Comments) 12/31/2021   Crestor [rosuvastatin] Other (See Comments) 03/15/2021   Ezetimibe  Diarrhea 12/31/2021   Gluten meal Other (See Comments) 05/01/2012   Lansoprazole Other (See Comments) 04/27/2011   Nitrofuran derivatives Itching and Nausea Only 12/23/2014   Phenergan [promethazine hcl] Other (See Comments) 05/01/2012   Wheat Other (See Comments) 12/31/2021   Codeine Palpitations 05/01/2012   Darvon [propoxyphene hcl] Palpitations 05/01/2012   Doxycycline Itching and Nausea Only 07/29/2016    Family History  Problem Relation Age of Onset   Liver cancer Sister    Bone cancer Sister    Bladder Cancer Sister    Brain cancer Sister    Lung cancer Sister    Heart disease Sister    Hypertension Sister    Pancreatic cancer Brother    Liver cancer Brother    Lung cancer Brother    Hypertension Other    Colon cancer Neg Hx    Colon polyps Neg Hx    Esophageal cancer Neg Hx    Rectal cancer Neg Hx    Stomach cancer Neg Hx     Social  History   Socioeconomic History   Marital status: Widowed    Spouse name: Not on file   Number of children: 5   Years of education: Not on file   Highest education level: Not on file  Occupational History   Occupation: retired  Tobacco Use   Smoking status: Former    Current packs/day: 0.00    Average packs/day: 2.0 packs/day for 33.0 years (66.0 ttl pk-yrs)    Types: Cigarettes    Start date: 36    Quit date: 33    Years since quitting: 44.2   Smokeless tobacco: Never  Vaping Use   Vaping status: Never Used  Substance and Sexual Activity   Alcohol use: Not Currently    Comment: rare   Drug use: No   Sexual activity: Not on file  Other Topics Concern   Not on file  Social History Narrative   Not on file   Social Drivers of Health   Financial Resource Strain: Low Risk  (05/26/2021)   Received from [facility], [facility]   Overall Financial Resource Strain (CARDIA)    Difficulty of Paying Living Expenses: Not hard at all  Food Insecurity: No Food Insecurity (07/04/2023)   Hunger Vital Sign    Worried About  Running Out of Food in the Last Year: Never true    Ran Out of Food in the Last Year: Never true  Transportation Needs: No Transportation Needs (07/04/2023)   PRAPARE - Administrator, Civil Service (Medical): No    Lack of Transportation (Non-Medical): No  Physical Activity: Unknown (05/26/2021)   Received from [facility], [facility]   Exercise Vital Sign    Days of Exercise per Week: 0 days    Minutes of Exercise per Session: Not on file  Stress: No Stress Concern Present (05/26/2021)   Received from [facility], [facility] - Occupational Stress Questionnaire    Feeling of Stress : Not at all  Social Connections: Moderately Isolated (07/04/2023)   Social Connection and Isolation Panel [NHANES]    Frequency of Communication with Friends and Family: More than three times a week    Frequency of Social Gatherings with Friends and Family: More than three times a week    Attends Religious Services: More than 4 times per year    Active Member of [employer] or Organizations: No    Attends Banker Meetings: Never    Marital Status: Widowed  Intimate Partner Violence: Not At Risk (07/04/2023)   Humiliation, Afraid, Rape, and Kick questionnaire    Fear of Current or Ex-Partner: No    Emotionally Abused: No    Physically Abused: No    Sexually Abused: No    Review of Systems:    Constitutional: No weight loss, fever, chills, weakness or fatigue HEENT: Eyes: No change in vision               Ears, Nose, Throat:  No change in hearing or congestion Skin: No rash or itching Cardiovascular: No chest pain, chest pressure or palpitations   Respiratory: No SOB or cough Gastrointestinal: See HPI and otherwise negative Genitourinary: No dysuria or change in urinary frequency Neurological: No headache, dizziness or syncope Musculoskeletal: No new muscle or joint pain Hematologic: No bleeding or bruising Psychiatric: No history of  depression or anxiety    Physical Exam:  Vital signs: There were no vitals taken for this visit.  Constitutional: NAD, Well developed,  Well nourished, alert and cooperative Head:  Normocephalic and atraumatic. Eyes:   PEERL, EOMI. No icterus. Conjunctiva pink. Respiratory: Respirations even and unlabored. Lungs clear to auscultation bilaterally.   No wheezes, crackles, or rhonchi.  Cardiovascular:  Regular rate and rhythm. No peripheral edema, cyanosis or pallor.  Gastrointestinal:  Soft, nondistended, nontender. No rebound or guarding. Normal bowel sounds. No appreciable masses or hepatomegaly. Rectal:  Not performed.  Msk:  Symmetrical without gross deformities. Without edema, no deformity or joint abnormality.  Neurologic:  Alert and  oriented x4;  grossly normal neurologically.  Skin:   Dry and intact without significant lesions or rashes. Psychiatric: Oriented to person, place and time. Demonstrates good judgement and reason without abnormal affect or behaviors.  Physical Exam    RELEVANT LABS AND IMAGING: CBC    Component Value Date/Time   WBC 5.2 07/08/2023 0502   RBC 3.31 (L) 07/08/2023 0502   HGB 10.0 (L) 07/08/2023 0502   HGB 12.4 05/30/2021 1526   HCT 32.7 (L) 07/08/2023 0502   HCT 38.7 05/30/2021 1526   PLT 226 07/08/2023 0502   PLT 261 05/30/2021 1526   MCV 98.8 07/08/2023 0502   MCV 84 05/30/2021 1526   MCH 30.2 07/08/2023 0502   MCHC 30.6 07/08/2023 0502   RDW 15.9 (H) 07/08/2023 0502   RDW 16.4 (H) 05/30/2021 1526   LYMPHSABS 0.8 07/04/2023 1003   MONOABS 0.7 07/04/2023 1003   EOSABS 0.3 07/04/2023 1003   BASOSABS 0.1 07/04/2023 1003    CMP     Component Value Date/Time   NA 132 (L) 07/08/2023 0502   NA 140 05/30/2021 1526   K 4.2 07/08/2023 0502   CL 103 07/08/2023 0502   CO2 23 07/08/2023 0502   GLUCOSE 93 07/08/2023 0502   BUN 35 (H) 07/08/2023 0502   BUN 34 (H) 05/30/2021 1526   CREATININE 1.09 (H) 07/08/2023 0502   CALCIUM 9.1 07/08/2023  0502   PROT 6.7 06/23/2023 1630   ALBUMIN 3.7 06/23/2023 1630   AST 73 (H) 06/23/2023 1630   ALT 36 06/23/2023 1630   ALKPHOS 59 06/23/2023 1630   BILITOT 0.7 06/23/2023 1630   GFRNONAA 50 (L) 07/08/2023 0502   GFRAA 54 (L) 01/22/2019 1405     Assessment/Plan:   88 year old female with a history of celiac disease, around a year of constipation and obstipation, prior abdominal surgeries including repair of intussusception as a child, appendectomy, hysterectomy, lysis of adhesions, history of CHF, CAD, A-fib on Eliquis, hyperparathyroidism, hypertension and ICD who is here for follow-up   Chronic constipation History of abdominal adhesive disease, celiac disease, colonoscopy 3 years ago without significant pathology.  Multiple CT scans over the last years showing prominent stool burden.  No other significant findings.  Linzess, Santizo, Trulance incompletely effective.  Improved with Motegrity 2 Mg daily.  Recent worsening constipation likely secondary to exacerbation of thyroid disease with recent TSH 9.9-0 February 2025    Assessment & Plan        Otis Portal [facility] 07/27/2023, 12:14 PM  Cc: [facility]

## 2023-07-28 ENCOUNTER — Ambulatory Visit: Payer: Medicare Other | Admitting: Gastroenterology

## 2023-08-02 ENCOUNTER — Other Ambulatory Visit (HOSPITAL_BASED_OUTPATIENT_CLINIC_OR_DEPARTMENT_OTHER): Payer: Self-pay | Admitting: Cardiology

## 2023-08-02 DIAGNOSIS — I4819 Other persistent atrial fibrillation: Secondary | ICD-10-CM

## 2023-08-09 ENCOUNTER — Encounter: Payer: Self-pay | Admitting: Infectious Disease

## 2023-08-09 DIAGNOSIS — N390 Urinary tract infection, site not specified: Secondary | ICD-10-CM | POA: Insufficient documentation

## 2023-08-09 HISTORY — DX: Urinary tract infection, site not specified: N39.0

## 2023-08-09 NOTE — Progress Notes (Unsigned)
 Subjective:  No chief complaint on file.  Chief complaint: chronic dysuria   Patient ID: Amy Hickman, female    DOB: 1935-12-07, 88 y.o.   MRN: [id number]  HPI  Discussed the use of AI scribe software for clinical note transcription with the patient, who gave verbal consent to proceed.  History of Present Illness   The patient, with a history of interstitial cystitis and recurrent urinary tract infections, presents with ongoing urinary symptoms. She describes a chronic sensation of pain during urination, which she attributes to her interstitial cystitis. She acknowledges the difficulty in distinguishing between her chronic symptoms and those of an acute urinary tract infection, as both conditions present similarly. She also reports a history of resistant bacteria in her bladder, likely due to frequent antibiotic use.  In addition to her urinary symptoms, the patient also reports issues with bowel movements, specifically constipation. She describes episodes where her bowels "stop" on her, leading to significant constipation, which she refers to as a "poop baby." She is currently working with a GI doctor to manage this issue.  The patient recently had a urine culture performed during a visit to the urologist, which revealed the presence of bacteria. However, she did not have any acute symptoms at the time of the culture taken yesterday, and it was not treated with antibiotics and I DO NOT THINK SHE NEEDS antibiotics at present.. She is scheduled for a cystoscopy in June and pre-operative antibiotics might be needed then and gettting a culture soon before that procedure woudl direct this. She might need an IV carbapenem given with Urology on day of the procedure.        Past Medical History:  Diagnosis Date   Atrial fibrillation, chronic (HCC)    s/p multiple cardioversions with sustained NSR, on Eliquis    Celiac disease    Chronic systolic CHF (congestive heart failure) (HCC)     Coronary artery disease involving native coronary artery of native heart without angina pectoris 03/15/2021   GERD (gastroesophageal reflux disease)    HOH (hard of hearing)    Hyperparathyroidism (HCC) 12/12/2020   Hypertension    Interstitial cystitis    Recurrent UTI 08/09/2023    Past Surgical History:  Procedure Laterality Date   ABDOMINAL HYSTERECTOMY     APPENDECTOMY     CARDIOVERSION N/A 04/24/2021   Procedure: CARDIOVERSION;  Surgeon: Hugh Madura, MD;  Location: [facility] ENDOSCOPY;  Service: Cardiovascular;  Laterality: N/A;   CARDIOVERSION N/A 06/12/2021   Procedure: CARDIOVERSION;  Surgeon: Sheryle Donning, MD;  Location: [facility] ENDOSCOPY;  Service: Cardiovascular;  Laterality: N/A;   CARDIOVERSION N/A 08/11/2021   Procedure: CARDIOVERSION;  Surgeon: Sonny Dust, MD;  Location: [facility] ENDOSCOPY;  Service: Cardiovascular;  Laterality: N/A;   CHOLECYSTECTOMY     ear drum surgery     REPLACEMENT TOTAL KNEE     ROTATOR CUFF REPAIR Right    TONSILLECTOMY      Family History  Problem Relation Age of Onset   Liver cancer Sister    Bone cancer Sister    Bladder Cancer Sister    Brain cancer Sister    Lung cancer Sister    Heart disease Sister    Hypertension Sister    Pancreatic cancer Brother    Liver cancer Brother    Lung cancer Brother    Hypertension Other    Colon cancer Neg Hx    Colon polyps Neg Hx    Esophageal cancer Neg Hx    Rectal  cancer Neg Hx    Stomach cancer Neg Hx       Social History   Socioeconomic History   Marital status: Widowed    Spouse name: Not on file   Number of children: 5   Years of education: Not on file   Highest education level: Not on file  Occupational History   Occupation: retired  Tobacco Use   Smoking status: Former    Current packs/day: 0.00    Average packs/day: 2.0 packs/day for 33.0 years (66.0 ttl pk-yrs)    Types: Cigarettes    Start date: 69    Quit date: 40    Years since quitting: 44.3   Smokeless  tobacco: Never  Vaping Use   Vaping status: Never Used  Substance and Sexual Activity   Alcohol  use: Not Currently    Comment: rare   Drug use: No   Sexual activity: Not on file  Other Topics Concern   Not on file  Social History Narrative   Not on file   Social Drivers of Health   Financial Resource Strain: Low Risk  (05/26/2021)   Received from [facility], [facility]   Overall Financial Resource Strain (CARDIA)    Difficulty of Paying Living Expenses: Not hard at all  Food Insecurity: No Food Insecurity (07/04/2023)   Hunger Vital Sign    Worried About Running Out of Food in the Last Year: Never true    Ran Out of Food in the Last Year: Never true  Transportation Needs: No Transportation Needs (07/04/2023)   PRAPARE - Administrator, Civil Service (Medical): No    Lack of Transportation (Non-Medical): No  Physical Activity: Unknown (05/26/2021)   [facility]   Exercise Vital Sign    Days of Exercise per Week: 0 days    Minutes of Exercise per Session: Not on file  Stress: No Stress Concern Present (05/26/2021)   Received from [facility], [facility] of Occupational Health - Occupational Stress Questionnaire    Feeling of Stress : Not at all  Social Connections: Moderately Isolated (07/04/2023)   Social Connection and Isolation Panel [NHANES]    Frequency of Communication with Friends and Family: More than three times a week    Frequency of Social Gatherings with Friends and Family: More than three times a week    Attends Religious Services: More than 4 times per year    Active Member of [employer] or Organizations: No    Attends Banker Meetings: Never    Marital Status: Widowed    Allergies  Allergen Reactions   Cephalexin Other (See Comments)    Abdominal pain , GI distress, Abdominal pain , GI distress   Hydrocodone-Acetaminophen  Other (See Comments) and Rash    Palpitations, flushing,  hyperactivity, Chest pain, Chest pain   Ativan [Lorazepam] Other (See Comments)    "Knocked her out, woke up 3 days later"   Augmentin  [Amoxicillin -Pot Clavulanate] Nausea And Vomiting   Avelox [Moxifloxacin] Itching and Nausea Only   Brimonidine Itching and Swelling    redness   Chlorzoxazone Other (See Comments)    Went crazy/loopy   Ciprofloxacin Hcl Itching and Nausea Only   Clavulanic Acid Other (See Comments)     Unknown   Crestor [Rosuvastatin] Other (See Comments)    myalgia   Ezetimibe Diarrhea    Stomach cramps   Gluten Meal Other (See Comments)    Diarrhea, hot/cold sweat, will sleep  for a couple of hours - Celiac disease   Lansoprazole Other (See Comments)    GI intolerance   Nitrofuran Derivatives Itching and Nausea Only   Phenergan  [Promethazine  Hcl] Other (See Comments)    hallucinations   Wheat Other (See Comments)    Gi distress- celiac disease   Codeine Palpitations   Darvon [Propoxyphene Hcl] Palpitations   Doxycycline  Itching and Nausea Only     Current Outpatient Medications:    acetaminophen  (TYLENOL ) 500 MG tablet, Take 1 tablet (500 mg total) by mouth every 6 (six) hours as needed for pain. (Patient taking differently: Take 1,000 mg by mouth as needed for pain or moderate pain (pain score 4-6).), Disp: 30 tablet, Rfl: 0   amiodarone  (PACERONE ) 200 MG tablet, TAKE 1 TABLET DAILY, Disp: 90 tablet, Rfl: 3   carvedilol  (COREG ) 12.5 MG tablet, Take 1 tablet (12.5 mg total) by mouth 2 (two) times daily., Disp: 180 tablet, Rfl: 3   clidinium-chlordiazePOXIDE  (LIBRAX) 5-2.5 MG capsule, Take 2 capsules by mouth daily as needed (cramping)., Disp: , Rfl:    cyanocobalamin  (VITAMIN B12) 500 MCG tablet, Take 1 tablet (500 mcg total) by mouth daily., Disp: 30 tablet, Rfl: 0   diltiazem  (CARDIZEM  CD) 360 MG 24 hr capsule, Take 1 capsule (360 mg total) by mouth daily., Disp: 90 capsule, Rfl: 3   ELIQUIS  5 MG TABS tablet, TAKE 1 TABLET TWICE A DAY, Disp: 180 tablet, Rfl:  3   esomeprazole (NEXIUM) 40 MG capsule, Take 40 mg by mouth daily before breakfast., Disp: , Rfl:    fenofibrate  54 MG tablet, Take 54 mg by mouth every evening., Disp: , Rfl:    folic acid  (FOLVITE ) 1 MG tablet, Take 1 tablet (1 mg total) by mouth daily., Disp: 30 tablet, Rfl: 0   hydrALAZINE  (APRESOLINE ) 100 MG tablet, TAKE 1 TABLET THREE TIMES A DAY, Disp: 270 tablet, Rfl: 3   Hyoscyamine  Sulfate SL (OSCIMIN ) 0.125 MG SUBL, Take 0.125 mg by mouth as needed (cramping)., Disp: , Rfl:    isosorbide  dinitrate (ISORDIL ) 10 MG tablet, Take 10 mg by mouth 2 (two) times daily., Disp: , Rfl:    latanoprost  (XALATAN ) 0.005 % ophthalmic solution, Place 1 drop into both eyes at bedtime., Disp: , Rfl:    levothyroxine  (SYNTHROID ) 112 MCG tablet, Take 112 mcg by mouth daily before breakfast., Disp: , Rfl:    Methenamine-Sodium Salicylate (CYSTEX PO), Take 1 Capful by mouth daily., Disp: , Rfl:    ondansetron  (ZOFRAN ) 4 MG tablet, TAKE 1 TABLET EVERY 8 HOURS AS NEEDED FOR NAUSEA OR VOMITING (Patient taking differently: Take 4 mg by mouth every 8 (eight) hours as needed for nausea or vomiting.), Disp: 20 tablet, Rfl: 51   pilocarpine  (PILOCAR) 1 % ophthalmic solution, Place 1 drop into the right eye 2 (two) times daily., Disp: , Rfl:    polyethylene glycol (MIRALAX  / GLYCOLAX ) 17 g packet, Take 17 g by mouth 2 (two) times daily., Disp: 14 each, Rfl: 0   polyvinyl alcohol  (ARTIFICIAL TEARS) 1.4 % ophthalmic solution, Place 1 drop into both eyes as needed for dry eyes., Disp: , Rfl:    Prucalopride Succinate  (MOTEGRITY ) 2 MG TABS, TAKE 1 TABLET(2 MG) BY MOUTH DAILY (Patient taking differently: Take 1 tablet by mouth at bedtime.), Disp: 90 tablet, Rfl: 3   TRULANCE  3 MG TABS, Take 1 tablet by mouth daily. (Patient not taking: Reported on 07/05/2023), Disp: , Rfl:    Vitamin D, Ergocalciferol, (DRISDOL) 1.25 MG (50000 UNIT) CAPS capsule,  Take 50,000 Units by mouth every 7 (seven) days., Disp: , Rfl:    Review of  Systems  Constitutional:  Negative for activity change, appetite change, chills, diaphoresis, fatigue, fever and unexpected weight change.  HENT:  Negative for congestion, rhinorrhea, sinus pressure, sneezing, sore throat and trouble swallowing.   Eyes:  Negative for photophobia and visual disturbance.  Respiratory:  Negative for cough, chest tightness, shortness of breath, wheezing and stridor.   Cardiovascular:  Negative for chest pain, palpitations and leg swelling.  Gastrointestinal:  Negative for abdominal distention, abdominal pain, anal bleeding, blood in stool, constipation, diarrhea, nausea and vomiting.  Genitourinary:  Positive for dysuria. Negative for difficulty urinating, flank pain and hematuria.  Musculoskeletal:  Negative for arthralgias, back pain, gait problem, joint swelling and myalgias.  Skin:  Negative for color change, pallor, rash and wound.  Neurological:  Negative for dizziness, tremors, weakness and light-headedness.  Hematological:  Negative for adenopathy. Does not bruise/bleed easily.  Psychiatric/Behavioral:  Negative for agitation, behavioral problems, confusion, decreased concentration, dysphoric mood and sleep disturbance.        Objective:   Physical Exam Constitutional:      General: She is not in acute distress.    Appearance: Normal appearance. She is well-developed. She is not ill-appearing or diaphoretic.  HENT:     Head: Normocephalic and atraumatic.     Right Ear: Hearing and external ear normal.     Left Ear: Hearing and external ear normal.     Nose: No nasal deformity or rhinorrhea.  Eyes:     General: No scleral icterus.    Conjunctiva/sclera: Conjunctivae normal.     Right eye: Right conjunctiva is not injected.     Left eye: Left conjunctiva is not injected.     Pupils: Pupils are equal, round, and reactive to light.  Neck:     Vascular: No JVD.  Cardiovascular:     Rate and Rhythm: Normal rate and regular rhythm.     Heart sounds:  S1 normal and S2 normal.  Pulmonary:     Effort: Pulmonary effort is normal. No respiratory distress.     Breath sounds: No wheezing.  Abdominal:     General: Bowel sounds are normal. There is no distension.     Palpations: Abdomen is soft.     Tenderness: There is no abdominal tenderness.  Musculoskeletal:        General: Normal range of motion.     Right shoulder: Normal.     Left shoulder: Normal.     Cervical back: Normal range of motion and neck supple.     Right hip: Normal.     Left hip: Normal.     Right knee: Normal.     Left knee: Normal.  Lymphadenopathy:     Head:     Right side of head: No submandibular, preauricular or posterior auricular adenopathy.     Left side of head: No submandibular, preauricular or posterior auricular adenopathy.     Cervical: No cervical adenopathy.     Right cervical: No superficial or deep cervical adenopathy.    Left cervical: No superficial or deep cervical adenopathy.  Skin:    General: Skin is warm and dry.     Coloration: Skin is not pale.     Findings: No abrasion, bruising, ecchymosis, erythema, lesion or rash.     Nails: There is no clubbing.  Neurological:     Mental Status: She is alert and oriented to person, place, and  time.     Sensory: No sensory deficit.     Coordination: Coordination normal.     Gait: Gait normal.  Psychiatric:        Attention and Perception: She is attentive.        Mood and Affect: Mood normal.        Speech: Speech normal.        Behavior: Behavior normal. Behavior is cooperative.        Thought Content: Thought content normal.        Judgment: Judgment normal.           Assessment & Plan:   Assessment and Plan    Recurrent urinary tract infections Chronic UTIs with antibiotic-resistant bacteria colonization (ESBL). No acute infection. Emphasized risk of resistance with unnecessary antibiotics. - Avoid antibiotics unless symptoms worsen or new symptoms develop such as flank pain,  fevers, confusion - Plan for antibiotic prophylaxis prior to cystoscopy on June 3rd.  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 Discussed resistance risks with frequent antibiotic use. - Avoid unnecessary antibiotic use to prevent further resistance. - Consider fosfomycin if treatment for a less serious lower UTI--hard to pin that type of diagnosis down in her with her chronic symptoms that one might have activity vs her ESBL klebsiella  Interstitial cystitis Chronic interstitial cystitis causing persistent dysuria. Symptoms overlap with UTIs, complicating diagnosis and treatment. - Continue methenamine and Premarin    Chronic constipation Chronic constipation causing significant discomfort. Managed with a gastroenterologist. - Continue management with gastroenterologist.      I have personally spent 26 minutes involved in face-to-face and non-face-to-face activities for this patient on the day of the visit. Professional time spent includes the following activities: Preparing to see the patient (review of tests), Obtaining and/or reviewing separately obtained history (admission/discharge record), Performing a medically appropriate examination and/or evaluation , Ordering medications/tests/procedures, referring and communicating with other health care professionals, Documenting clinical information in the EMR, Independently interpreting results (not separately reported), Communicating results to the patient/family/caregiver, Counseling and educating the patient/family/caregiver and Care coordination (not separately reported).

## 2023-08-10 ENCOUNTER — Other Ambulatory Visit: Payer: Self-pay

## 2023-08-10 ENCOUNTER — Ambulatory Visit: Payer: Self-pay | Admitting: Infectious Disease

## 2023-08-10 ENCOUNTER — Encounter: Payer: Self-pay | Admitting: Infectious Disease

## 2023-08-10 VITALS — BP 174/78 | HR 64 | Resp 16 | Ht 63.0 in | Wt 130.2 lb

## 2023-08-10 DIAGNOSIS — Z1620 Resistance to unspecified antibiotic: Secondary | ICD-10-CM

## 2023-08-10 DIAGNOSIS — N3010 Interstitial cystitis (chronic) without hematuria: Secondary | ICD-10-CM

## 2023-08-10 DIAGNOSIS — K5909 Other constipation: Secondary | ICD-10-CM | POA: Diagnosis not present

## 2023-08-10 DIAGNOSIS — N1832 Chronic kidney disease, stage 3b: Secondary | ICD-10-CM

## 2023-08-10 DIAGNOSIS — N390 Urinary tract infection, site not specified: Secondary | ICD-10-CM | POA: Diagnosis present

## 2023-08-10 DIAGNOSIS — B9689 Other specified bacterial agents as the cause of diseases classified elsewhere: Secondary | ICD-10-CM | POA: Diagnosis not present

## 2023-08-11 ENCOUNTER — Encounter: Payer: Self-pay | Admitting: Infectious Disease

## 2023-08-11 NOTE — Telephone Encounter (Signed)
 Patient daughter Amy Hickman aware. She will check with her mom to see if symptoms have worsened since yesterday, if not no changes or antibiotics will be prescribed per Dr.Van Dam.

## 2023-08-14 ENCOUNTER — Encounter (HOSPITAL_BASED_OUTPATIENT_CLINIC_OR_DEPARTMENT_OTHER): Payer: Self-pay | Admitting: Emergency Medicine

## 2023-08-14 ENCOUNTER — Emergency Department (HOSPITAL_BASED_OUTPATIENT_CLINIC_OR_DEPARTMENT_OTHER)
Admission: EM | Admit: 2023-08-14 | Discharge: 2023-08-15 | Disposition: A | Discharge status: Home / Self Care | Source: Home / Self Care | Attending: Emergency Medicine | Admitting: Emergency Medicine

## 2023-08-14 ENCOUNTER — Other Ambulatory Visit: Payer: Self-pay

## 2023-08-14 DIAGNOSIS — I5020 Unspecified systolic (congestive) heart failure: Secondary | ICD-10-CM | POA: Insufficient documentation

## 2023-08-14 DIAGNOSIS — N390 Urinary tract infection, site not specified: Secondary | ICD-10-CM | POA: Insufficient documentation

## 2023-08-14 DIAGNOSIS — D500 Iron deficiency anemia secondary to blood loss (chronic): Secondary | ICD-10-CM | POA: Insufficient documentation

## 2023-08-14 DIAGNOSIS — N289 Disorder of kidney and ureter, unspecified: Secondary | ICD-10-CM | POA: Insufficient documentation

## 2023-08-14 DIAGNOSIS — Z7901 Long term (current) use of anticoagulants: Secondary | ICD-10-CM | POA: Insufficient documentation

## 2023-08-14 DIAGNOSIS — I110 Hypertensive heart disease with heart failure: Secondary | ICD-10-CM | POA: Insufficient documentation

## 2023-08-14 DIAGNOSIS — I4891 Unspecified atrial fibrillation: Secondary | ICD-10-CM | POA: Insufficient documentation

## 2023-08-14 DIAGNOSIS — D649 Anemia, unspecified: Secondary | ICD-10-CM

## 2023-08-14 DIAGNOSIS — N3000 Acute cystitis without hematuria: Secondary | ICD-10-CM | POA: Insufficient documentation

## 2023-08-14 LAB — COMPREHENSIVE METABOLIC PANEL WITH GFR
ALT: 38 U/L (ref 0–44)
AST: 97 U/L — ABNORMAL HIGH (ref 15–41)
Albumin: 3.7 g/dL (ref 3.5–5.0)
Alkaline Phosphatase: 118 U/L (ref 38–126)
Anion gap: 13 (ref 5–15)
BUN: 23 mg/dL (ref 8–23)
CO2: 21 mmol/L — ABNORMAL LOW (ref 22–32)
Calcium: 10.3 mg/dL (ref 8.9–10.3)
Chloride: 102 mmol/L (ref 98–111)
Creatinine, Ser: 1.35 mg/dL — ABNORMAL HIGH (ref 0.44–1.00)
GFR, Estimated: 38 mL/min — ABNORMAL LOW (ref >60)
Glucose, Bld: 98 mg/dL (ref 70–99)
Potassium: 5.2 mmol/L — ABNORMAL HIGH (ref 3.5–5.1)
Sodium: 136 mmol/L (ref 135–145)
Total Bilirubin: 0.3 mg/dL (ref 0.0–1.2)
Total Protein: 6.1 g/dL — ABNORMAL LOW (ref 6.5–8.1)

## 2023-08-14 LAB — CBC WITH DIFFERENTIAL/PLATELET
Abs Immature Granulocytes: 0.04 10*3/uL (ref 0.00–0.07)
Basophils Absolute: 0 10*3/uL (ref 0.0–0.1)
Basophils Relative: 0 %
Eosinophils Absolute: 0.2 10*3/uL (ref 0.0–0.5)
Eosinophils Relative: 3 %
HCT: 32.4 % — ABNORMAL LOW (ref 36.0–46.0)
Hemoglobin: 10.4 g/dL — ABNORMAL LOW (ref 12.0–15.0)
Immature Granulocytes: 1 %
Lymphocytes Relative: 9 %
Lymphs Abs: 0.7 10*3/uL (ref 0.7–4.0)
MCH: 30.3 pg (ref 26.0–34.0)
MCHC: 32.1 g/dL (ref 30.0–36.0)
MCV: 94.5 fL (ref 80.0–100.0)
Monocytes Absolute: 0.7 10*3/uL (ref 0.1–1.0)
Monocytes Relative: 9 %
Neutro Abs: 5.9 10*3/uL (ref 1.7–7.7)
Neutrophils Relative %: 78 %
Platelets: 276 10*3/uL (ref 150–400)
RBC: 3.43 MIL/uL — ABNORMAL LOW (ref 3.87–5.11)
RDW: 16.3 % — ABNORMAL HIGH (ref 11.5–15.5)
WBC: 7.5 10*3/uL (ref 4.0–10.5)
nRBC: 0 % (ref 0.0–0.2)

## 2023-08-14 NOTE — ED Triage Notes (Signed)
 Pt with recurrent UTIs, here for dysuria; feeling generally weak

## 2023-08-15 ENCOUNTER — Encounter: Payer: Self-pay | Admitting: Infectious Disease

## 2023-08-15 LAB — URINALYSIS, MICROSCOPIC (REFLEX): RBC / HPF: >50 RBC/hpf (ref 0–5)

## 2023-08-15 LAB — URINALYSIS, ROUTINE W REFLEX MICROSCOPIC
Bilirubin Urine: NEGATIVE
Glucose, UA: NEGATIVE mg/dL
Ketones, ur: NEGATIVE mg/dL
Nitrite: NEGATIVE
Protein, ur: 100 mg/dL — AB
Specific Gravity, Urine: 1.025 (ref 1.005–1.030)
pH: 5.5 (ref 5.0–8.0)

## 2023-08-15 MED ORDER — SULFAMETHOXAZOLE-TRIMETHOPRIM 800-160 MG PO TABS: 1.0000 | ORAL_TABLET | Freq: Two times a day (BID) | ORAL | 0 refills | Status: DC

## 2023-08-15 MED ORDER — SULFAMETHOXAZOLE-TRIMETHOPRIM 800-160 MG PO TABS
1.0000 | ORAL_TABLET | Freq: Once | ORAL | Status: AC
  Administered 2023-08-15: 1 via ORAL
  Filled 2023-08-15: qty 1

## 2023-08-15 NOTE — Discharge Instructions (Addendum)
 Return if you start running a fever, pain is getting worse, or if you start vomiting.

## 2023-08-15 NOTE — ED Provider Notes (Signed)
 [place] [facility] Provider Note   CSN: [id number] Arrival date & time: 08/14/23  2218     History  Chief Complaint  Patient presents with   Dysuria    Amy Hickman is a 88 y.o. female.  The history is provided by the patient.  Dysuria She has history of hypertension, coronary artery disease, celiac disease, systolic heart failure, atrial fibrillation anticoagulated on apixaban  and frequent urinary tract infections and comes in complaining of dysuria and urinary urgency and frequency which started yesterday and got worse today.  She did have a urine culture done about 1 week ago.  She denies fever or chills and denies nausea or vomiting.   Home Medications Prior to Admission medications   Medication Sig Start Date End Date Taking? Authorizing Provider  sulfamethoxazole -trimethoprim  (BACTRIM  DS) 800-160 MG tablet Take 1 tablet by mouth 2 (two) times daily for 7 days. 08/15/23 08/22/23 Yes Alissa April, MD  acetaminophen  (TYLENOL ) 500 MG tablet Take 1 tablet (500 mg total) by mouth every 6 (six) hours as needed for pain. Patient taking differently: Take 1,000 mg by mouth as needed for pain or moderate pain (pain score 4-6). 06/24/12   Dorenda Gandy, MD  amiodarone  (PACERONE ) 200 MG tablet TAKE 1 TABLET DAILY 08/02/23   Sheryle Donning, MD  carvedilol  (COREG ) 12.5 MG tablet Take 1 tablet (12.5 mg total) by mouth 2 (two) times daily. 12/08/22   Sheryle Donning, MD  clidinium-chlordiazePOXIDE  (LIBRAX) 5-2.5 MG capsule Take 2 capsules by mouth daily as needed (cramping). 02/28/21   [provider]  cyanocobalamin  (VITAMIN B12) 500 MCG tablet Take 1 tablet (500 mcg total) by mouth daily. 07/10/23   Regalado, Belkys A, MD  diltiazem  (CARDIZEM  CD) 360 MG 24 hr capsule Take 1 capsule (360 mg total) by mouth daily. 06/12/21   Sheryle Donning, MD  ELIQUIS  5 MG TABS tablet TAKE 1 TABLET TWICE A DAY 06/15/23   Sheryle Donning,  MD  esomeprazole (NEXIUM) 40 MG capsule Take 40 mg by mouth daily before breakfast.    [provider]  fenofibrate  54 MG tablet Take 54 mg by mouth every evening. 07/15/22   [provider]  folic acid  (FOLVITE ) 1 MG tablet Take 1 tablet (1 mg total) by mouth daily. 07/10/23   Regalado, Belkys A, MD  hydrALAZINE  (APRESOLINE ) 100 MG tablet TAKE 1 TABLET THREE TIMES A DAY 06/25/23   Sheryle Donning, MD  Hyoscyamine  Sulfate SL (OSCIMIN ) 0.125 MG SUBL Take 0.125 mg by mouth as needed (cramping). 12/05/21   [provider]  isosorbide  dinitrate (ISORDIL ) 10 MG tablet Take 10 mg by mouth 2 (two) times daily. 12/31/21   [provider]  latanoprost  (XALATAN ) 0.005 % ophthalmic solution Place 1 drop into both eyes at bedtime. 08/31/22   [provider]  levothyroxine  (SYNTHROID ) 112 MCG tablet Take 112 mcg by mouth daily before breakfast. 07/24/22   [provider]  Methenamine-Sodium Salicylate (CYSTEX PO) Take 1 Capful by mouth daily.    [provider]  ondansetron  (ZOFRAN ) 4 MG tablet TAKE 1 TABLET EVERY 8 HOURS AS NEEDED FOR NAUSEA OR VOMITING Patient taking differently: Take 4 mg by mouth every 8 (eight) hours as needed for nausea or vomiting. 12/07/22   Daina Drum, MD  pilocarpine  (PILOCAR) 1 % ophthalmic solution Place 1 drop into the right eye 2 (two) times daily. 08/31/22   [provider]  polyethylene glycol (MIRALAX  / GLYCOLAX ) 17 g packet Take 17 g by  mouth 2 (two) times daily. 07/13/22   Danford, Willis Harter, MD  polyvinyl alcohol  (ARTIFICIAL TEARS) 1.4 % ophthalmic solution Place 1 drop into both eyes as needed for dry eyes.    [provider]  Prucalopride Succinate  (MOTEGRITY ) 2 MG TABS TAKE 1 TABLET(2 MG) BY MOUTH DAILY Patient taking differently: Take 1 tablet by mouth at bedtime. 04/08/23   Pyrtle, Amber Bail, MD  TRULANCE  3 MG TABS Take 1 tablet by mouth daily. 04/02/23   [provider]  Vitamin D,  Ergocalciferol, (DRISDOL) 1.25 MG (50000 UNIT) CAPS capsule Take 50,000 Units by mouth every 7 (seven) days. 05/22/21   [provider]      Allergies    Cephalexin, Hydrocodone-acetaminophen , Ativan [lorazepam], Augmentin  [amoxicillin -pot clavulanate], Avelox [moxifloxacin], Brimonidine, Chlorzoxazone, Ciprofloxacin hcl, Clavulanic acid, Crestor [rosuvastatin], Ezetimibe, Gluten meal, Lansoprazole, Nitrofuran derivatives, Phenergan  [promethazine  hcl], Wheat, Codeine, Darvon [propoxyphene hcl], and Doxycycline     Review of Systems   Review of Systems  Genitourinary:  Positive for dysuria.  All other systems reviewed and are negative.   Physical Exam Updated Vital Signs BP (!) 166/67   Pulse (!) 59   Temp 97.7 F (36.5 C) (Oral)   Resp 18   Ht 5\' 3"  (1.6 m)   Wt 59.1 kg   SpO2 96%   BMI 23.08 kg/m  Physical Exam Vitals and nursing note reviewed.   88 year old female, resting comfortably and in no acute distress. Vital signs are significant for elevated blood pressure. Oxygen saturation is 96%, which is normal. Head is normocephalic and atraumatic. PERRLA, EOMI.  Back is nontender and there is no CVA tenderness. Lungs are clear without rales, wheezes, or rhonchi. Chest is nontender. Heart has regular rate and rhythm without murmur. Abdomen is soft, flat, with mild suprapubic tenderness.  There is no rebound or guarding. Neurologic: Awake and alert, moves all extremities equally.  ED Results / Procedures / Treatments   Labs (all labs ordered are listed, but only abnormal results are displayed) Labs Reviewed  URINALYSIS, ROUTINE W REFLEX MICROSCOPIC - Abnormal; Notable for the following components:      Result Value   APPearance CLOUDY (*)    Hgb urine dipstick LARGE (*)    Protein, ur 100 (*)    Leukocytes,Ua SMALL (*)    All other components within normal limits  CBC WITH DIFFERENTIAL/PLATELET - Abnormal; Notable for the following components:   RBC 3.43 (*)     Hemoglobin 10.4 (*)    HCT 32.4 (*)    RDW 16.3 (*)    All other components within normal limits  COMPREHENSIVE METABOLIC PANEL WITH GFR - Abnormal; Notable for the following components:   Potassium 5.2 (*)    CO2 21 (*)    Creatinine, Ser 1.35 (*)    Total Protein 6.1 (*)    AST 97 (*)    GFR, Estimated 38 (*)    All other components within normal limits  URINALYSIS, MICROSCOPIC (REFLEX) - Abnormal; Notable for the following components:   Bacteria, UA MANY (*)    All other components within normal limits   Procedures Procedures    Medications Ordered in ED Medications  sulfamethoxazole -trimethoprim  (BACTRIM  DS) 800-160 MG per tablet 1 tablet (1 tablet Oral Given 08/15/23 0227)    ED Course/ Medical Decision Making/ A&P  Medical Decision Making Amount and/or Complexity of Data Reviewed Labs: ordered.   Dysuria likely secondary to UTI.  I have reviewed her laboratory tests, and my interpretation is stable renal insufficiency, stable anemia, UTI manifested by pyuria and bacteria.  I reviewed her past records, and urine culture from 08/09/2023 shows Klebsiella which was sensitive to trimethoprim -sulfamethoxazole .  She has an extensive allergy list, but she is not allergic to sulfa .  I have ordered a dose of trimethoprim -sulfamethoxazole  and I am discharging her with a 1 week prescription for same.  Follow-up with primary care provider after completing course of antibiotics.  Final Clinical Impression(s) / ED Diagnoses Final diagnoses:  Acute cystitis without hematuria  Renal insufficiency  Normochromic normocytic anemia    Rx / DC Orders ED Discharge Orders          Ordered    sulfamethoxazole -trimethoprim  (BACTRIM  DS) 800-160 MG tablet  2 times daily        08/15/23 0220              Alissa April, MD 08/15/23 0231

## 2023-08-16 ENCOUNTER — Inpatient Hospital Stay (HOSPITAL_COMMUNITY)
Admission: EM | Admit: 2023-08-16 | Discharge: 2023-08-19 | DRG: 690 | Disposition: A | Discharge status: Home / Self Care | Attending: Family Medicine | Admitting: Family Medicine

## 2023-08-16 ENCOUNTER — Emergency Department (HOSPITAL_COMMUNITY)

## 2023-08-16 DIAGNOSIS — Z8052 Family history of malignant neoplasm of bladder: Secondary | ICD-10-CM

## 2023-08-16 DIAGNOSIS — Z91018 Allergy to other foods: Secondary | ICD-10-CM

## 2023-08-16 DIAGNOSIS — N390 Urinary tract infection, site not specified: Secondary | ICD-10-CM | POA: Diagnosis not present

## 2023-08-16 DIAGNOSIS — B9689 Other specified bacterial agents as the cause of diseases classified elsewhere: Secondary | ICD-10-CM | POA: Diagnosis present

## 2023-08-16 DIAGNOSIS — Z79899 Other long term (current) drug therapy: Secondary | ICD-10-CM

## 2023-08-16 DIAGNOSIS — Z885 Allergy status to narcotic agent status: Secondary | ICD-10-CM

## 2023-08-16 DIAGNOSIS — N3000 Acute cystitis without hematuria: Principal | ICD-10-CM | POA: Diagnosis present

## 2023-08-16 DIAGNOSIS — Z79890 Hormone replacement therapy (postmenopausal): Secondary | ICD-10-CM

## 2023-08-16 DIAGNOSIS — Z7901 Long term (current) use of anticoagulants: Secondary | ICD-10-CM

## 2023-08-16 DIAGNOSIS — I482 Chronic atrial fibrillation, unspecified: Secondary | ICD-10-CM | POA: Diagnosis present

## 2023-08-16 DIAGNOSIS — K581 Irritable bowel syndrome with constipation: Secondary | ICD-10-CM | POA: Diagnosis present

## 2023-08-16 DIAGNOSIS — N1832 Chronic kidney disease, stage 3b: Secondary | ICD-10-CM | POA: Diagnosis present

## 2023-08-16 DIAGNOSIS — Z90710 Acquired absence of both cervix and uterus: Secondary | ICD-10-CM

## 2023-08-16 DIAGNOSIS — I5022 Chronic systolic (congestive) heart failure: Secondary | ICD-10-CM | POA: Diagnosis present

## 2023-08-16 DIAGNOSIS — R54 Age-related physical debility: Secondary | ICD-10-CM | POA: Diagnosis present

## 2023-08-16 DIAGNOSIS — I480 Paroxysmal atrial fibrillation: Secondary | ICD-10-CM | POA: Diagnosis present

## 2023-08-16 DIAGNOSIS — N3010 Interstitial cystitis (chronic) without hematuria: Secondary | ICD-10-CM | POA: Diagnosis present

## 2023-08-16 DIAGNOSIS — I2510 Atherosclerotic heart disease of native coronary artery without angina pectoris: Secondary | ICD-10-CM | POA: Diagnosis present

## 2023-08-16 DIAGNOSIS — Z880 Allergy status to penicillin: Secondary | ICD-10-CM

## 2023-08-16 DIAGNOSIS — Z800 Family history of malignant neoplasm of digestive organs: Secondary | ICD-10-CM

## 2023-08-16 DIAGNOSIS — Z883 Allergy status to other anti-infective agents status: Secondary | ICD-10-CM

## 2023-08-16 DIAGNOSIS — I130 Hypertensive heart and chronic kidney disease with heart failure and stage 1 through stage 4 chronic kidney disease, or unspecified chronic kidney disease: Secondary | ICD-10-CM | POA: Diagnosis present

## 2023-08-16 DIAGNOSIS — Z881 Allergy status to other antibiotic agents status: Secondary | ICD-10-CM

## 2023-08-16 DIAGNOSIS — E039 Hypothyroidism, unspecified: Secondary | ICD-10-CM | POA: Diagnosis present

## 2023-08-16 DIAGNOSIS — I10 Essential (primary) hypertension: Secondary | ICD-10-CM | POA: Diagnosis present

## 2023-08-16 DIAGNOSIS — Z8249 Family history of ischemic heart disease and other diseases of the circulatory system: Secondary | ICD-10-CM

## 2023-08-16 DIAGNOSIS — Z87891 Personal history of nicotine dependence: Secondary | ICD-10-CM

## 2023-08-16 DIAGNOSIS — G4733 Obstructive sleep apnea (adult) (pediatric): Secondary | ICD-10-CM | POA: Diagnosis present

## 2023-08-16 DIAGNOSIS — Z808 Family history of malignant neoplasm of other organs or systems: Secondary | ICD-10-CM

## 2023-08-16 DIAGNOSIS — Z1611 Resistance to penicillins: Secondary | ICD-10-CM | POA: Diagnosis present

## 2023-08-16 DIAGNOSIS — I350 Nonrheumatic aortic (valve) stenosis: Secondary | ICD-10-CM | POA: Diagnosis present

## 2023-08-16 DIAGNOSIS — Z888 Allergy status to other drugs, medicaments and biological substances status: Secondary | ICD-10-CM

## 2023-08-16 DIAGNOSIS — B961 Klebsiella pneumoniae [K. pneumoniae] as the cause of diseases classified elsewhere: Secondary | ICD-10-CM | POA: Diagnosis present

## 2023-08-16 DIAGNOSIS — Z801 Family history of malignant neoplasm of trachea, bronchus and lung: Secondary | ICD-10-CM

## 2023-08-16 LAB — URINALYSIS, W/ REFLEX TO CULTURE (INFECTION SUSPECTED)
Bilirubin Urine: NEGATIVE
Glucose, UA: NEGATIVE mg/dL
Ketones, ur: NEGATIVE mg/dL
Nitrite: NEGATIVE
Protein, ur: 100 mg/dL — AB
RBC / HPF: >50 RBC/hpf (ref 0–5)
Specific Gravity, Urine: 1.012 (ref 1.005–1.030)
pH: 6 (ref 5.0–8.0)

## 2023-08-16 LAB — COMPREHENSIVE METABOLIC PANEL WITH GFR
ALT: 33 U/L (ref 0–44)
AST: 80 U/L — ABNORMAL HIGH (ref 15–41)
Albumin: 3.3 g/dL — ABNORMAL LOW (ref 3.5–5.0)
Alkaline Phosphatase: 101 U/L (ref 38–126)
Anion gap: 10 (ref 5–15)
BUN: 21 mg/dL (ref 8–23)
CO2: 23 mmol/L (ref 22–32)
Calcium: 9.9 mg/dL (ref 8.9–10.3)
Chloride: 101 mmol/L (ref 98–111)
Creatinine, Ser: 1.25 mg/dL — ABNORMAL HIGH (ref 0.44–1.00)
GFR, Estimated: 42 mL/min — ABNORMAL LOW (ref >60)
Glucose, Bld: 93 mg/dL (ref 70–99)
Potassium: 4.2 mmol/L (ref 3.5–5.1)
Sodium: 134 mmol/L — ABNORMAL LOW (ref 135–145)
Total Bilirubin: 0.8 mg/dL (ref 0.0–1.2)
Total Protein: 6.4 g/dL — ABNORMAL LOW (ref 6.5–8.1)

## 2023-08-16 LAB — CBC WITH DIFFERENTIAL/PLATELET
Abs Immature Granulocytes: 0.03 10*3/uL (ref 0.00–0.07)
Basophils Absolute: 0 10*3/uL (ref 0.0–0.1)
Basophils Relative: 1 %
Eosinophils Absolute: 0.2 10*3/uL (ref 0.0–0.5)
Eosinophils Relative: 3 %
HCT: 35.7 % — ABNORMAL LOW (ref 36.0–46.0)
Hemoglobin: 11.2 g/dL — ABNORMAL LOW (ref 12.0–15.0)
Immature Granulocytes: 1 %
Lymphocytes Relative: 10 %
Lymphs Abs: 0.6 10*3/uL — ABNORMAL LOW (ref 0.7–4.0)
MCH: 30.4 pg (ref 26.0–34.0)
MCHC: 31.4 g/dL (ref 30.0–36.0)
MCV: 97 fL (ref 80.0–100.0)
Monocytes Absolute: 0.6 10*3/uL (ref 0.1–1.0)
Monocytes Relative: 9 %
Neutro Abs: 4.5 10*3/uL (ref 1.7–7.7)
Neutrophils Relative %: 76 %
Platelets: 232 10*3/uL (ref 150–400)
RBC: 3.68 MIL/uL — ABNORMAL LOW (ref 3.87–5.11)
RDW: 15.9 % — ABNORMAL HIGH (ref 11.5–15.5)
WBC: 5.9 10*3/uL (ref 4.0–10.5)
nRBC: 0 % (ref 0.0–0.2)

## 2023-08-16 LAB — I-STAT CG4 LACTIC ACID, ED
Lactic Acid, Venous: 0.4 mmol/L — ABNORMAL LOW (ref 0.5–1.9)
Lactic Acid, Venous: 0.4 mmol/L — ABNORMAL LOW (ref 0.5–1.9)

## 2023-08-16 LAB — PROTIME-INR
INR: 1.7 — ABNORMAL HIGH (ref 0.8–1.2)
Prothrombin Time: 20.2 s — ABNORMAL HIGH (ref 11.4–15.2)

## 2023-08-16 MED ORDER — LEVOTHYROXINE SODIUM 125 MCG PO TABS
125.0000 ug | ORAL_TABLET | Freq: Every day | ORAL | Status: DC
  Administered 2023-08-17: 125 ug via ORAL
  Filled 2023-08-16: qty 1

## 2023-08-16 MED ORDER — ACETAMINOPHEN 650 MG RE SUPP: 650.0000 mg | Freq: Four times a day (QID) | RECTAL | Status: DC | PRN

## 2023-08-16 MED ORDER — CARVEDILOL 12.5 MG PO TABS
12.5000 mg | ORAL_TABLET | Freq: Two times a day (BID) | ORAL | Status: DC
  Administered 2023-08-17 – 2023-08-19 (×5): 12.5 mg via ORAL
  Filled 2023-08-16 (×5): qty 1

## 2023-08-16 MED ORDER — ONDANSETRON HCL 4 MG/2ML IJ SOLN: 4.0000 mg | Freq: Four times a day (QID) | INTRAMUSCULAR | Status: DC | PRN

## 2023-08-16 MED ORDER — HYDRALAZINE HCL 25 MG PO TABS
100.0000 mg | ORAL_TABLET | Freq: Three times a day (TID) | ORAL | Status: DC
  Administered 2023-08-17 – 2023-08-19 (×8): 100 mg via ORAL
  Filled 2023-08-16 (×8): qty 4

## 2023-08-16 MED ORDER — SODIUM CHLORIDE 0.9% FLUSH
3.0000 mL | Freq: Two times a day (BID) | INTRAVENOUS | Status: DC
  Administered 2023-08-17 – 2023-08-19 (×6): 3 mL via INTRAVENOUS

## 2023-08-16 MED ORDER — ONDANSETRON HCL 4 MG PO TABS: 4.0000 mg | ORAL_TABLET | Freq: Four times a day (QID) | ORAL | Status: DC | PRN

## 2023-08-16 MED ORDER — DILTIAZEM HCL ER COATED BEADS 120 MG PO CP24
360.0000 mg | ORAL_CAPSULE | Freq: Every day | ORAL | Status: DC
  Administered 2023-08-17 – 2023-08-19 (×3): 360 mg via ORAL
  Filled 2023-08-16 (×3): qty 3

## 2023-08-16 MED ORDER — ACETAMINOPHEN 325 MG PO TABS: 650.0000 mg | ORAL_TABLET | Freq: Four times a day (QID) | ORAL | Status: DC | PRN

## 2023-08-16 MED ORDER — APIXABAN 5 MG PO TABS
5.0000 mg | ORAL_TABLET | Freq: Two times a day (BID) | ORAL | Status: DC
  Administered 2023-08-17 – 2023-08-19 (×6): 5 mg via ORAL
  Filled 2023-08-16 (×6): qty 1

## 2023-08-16 MED ORDER — ISOSORBIDE DINITRATE 10 MG PO TABS
10.0000 mg | ORAL_TABLET | Freq: Two times a day (BID) | ORAL | Status: DC
  Administered 2023-08-17 – 2023-08-19 (×6): 10 mg via ORAL
  Filled 2023-08-16 (×6): qty 1

## 2023-08-16 MED ORDER — SODIUM CHLORIDE 0.9 % IV SOLN
1.0000 g | INTRAVENOUS | Status: DC
  Administered 2023-08-16: 1 g via INTRAVENOUS
  Filled 2023-08-16: qty 10

## 2023-08-16 MED ORDER — SENNOSIDES-DOCUSATE SODIUM 8.6-50 MG PO TABS: 1.0000 | ORAL_TABLET | Freq: Every evening | ORAL | Status: DC | PRN

## 2023-08-16 MED ORDER — SODIUM CHLORIDE 0.9 % IV SOLN
1.0000 g | INTRAVENOUS | Status: DC
  Administered 2023-08-17 – 2023-08-18 (×2): 1 g via INTRAVENOUS
  Filled 2023-08-16 (×2): qty 10

## 2023-08-16 MED ORDER — AMIODARONE HCL 200 MG PO TABS
200.0000 mg | ORAL_TABLET | Freq: Every day | ORAL | Status: DC
  Administered 2023-08-17 – 2023-08-19 (×3): 200 mg via ORAL
  Filled 2023-08-16 (×3): qty 1

## 2023-08-16 MED ORDER — BISACODYL 5 MG PO TBEC: 5.0000 mg | DELAYED_RELEASE_TABLET | Freq: Every day | ORAL | Status: DC | PRN

## 2023-08-16 NOTE — ED Triage Notes (Signed)
 Pt with recurrent UTIs, here for Flank pain, both sides. Daughter states patient is  feeling generally weak and is worse. Was treated about a month ago for UTI and was admitted to hospital for 5 days. Daughter states on Saturday they went to med center and Bactrim  prescribed. States when they called MD  he advised to go to the hospital. Patient still experiencing dysuria, lethargy and chills. Afebrile in triage, no fever reported

## 2023-08-16 NOTE — Hospital Course (Signed)
 Amy Hickman is a 88 y.o. female with medical history significant for paroxysmal atrial fibrillation on Eliquis , chronic HFmrEF, nonobstructive CAD, CKD stage IIIb, HTN, hypothyroidism, OSA on CPAP, recurrent UTI with chronic interstitial cystitis who is admitted with UTI.

## 2023-08-16 NOTE — ED Provider Notes (Signed)
 [place] EMERGENCY DEPARTMENT AT [facility] Provider Note   CSN: [id number] Arrival date & time: 08/16/23  1451     History  Chief Complaint  Patient presents with   Urinary Tract Infection   Abdominal Pain   Flank Pain   Chills    Amy Hickman is a 88 y.o. female.  Is an 88 year old female presenting emergency department for dysuria, flank pain, chills and generalized weakness.  Has had recurrent urinary tract infections with hospitalizations.  Recently on Bactrim .  Reports history of Klebsiella UTI.  Spoke with infectious disease doctor recommended reevaluation in the emergency department as her symptoms have continued.  No nausea no vomiting.   Urinary Tract Infection Associated symptoms: abdominal pain and flank pain   Abdominal Pain Flank Pain Associated symptoms include abdominal pain.       Home Medications Prior to Admission medications   Medication Sig Start Date End Date Taking? Authorizing Provider  acetaminophen  (TYLENOL ) 500 MG tablet Take 1 tablet (500 mg total) by mouth every 6 (six) hours as needed for pain. Patient taking differently: Take 1,000 mg by mouth as needed for pain or moderate pain (pain score 4-6). 06/24/12   Dorenda Gandy, MD  amiodarone  (PACERONE ) 200 MG tablet TAKE 1 TABLET DAILY 08/02/23   Sheryle Donning, MD  carvedilol  (COREG ) 12.5 MG tablet Take 1 tablet (12.5 mg total) by mouth 2 (two) times daily. 12/08/22   Sheryle Donning, MD  clidinium-chlordiazePOXIDE  (LIBRAX) 5-2.5 MG capsule Take 2 capsules by mouth daily as needed (cramping). 02/28/21   [provider]  cyanocobalamin  (VITAMIN B12) 500 MCG tablet Take 1 tablet (500 mcg total) by mouth daily. 07/10/23   Regalado, Belkys A, MD  diltiazem  (CARDIZEM  CD) 360 MG 24 hr capsule Take 1 capsule (360 mg total) by mouth daily. 06/12/21   Sheryle Donning, MD  ELIQUIS  5 MG TABS tablet TAKE 1 TABLET TWICE A DAY 06/15/23   Sheryle Donning, MD   esomeprazole (NEXIUM) 40 MG capsule Take 40 mg by mouth daily before breakfast.    [provider]  fenofibrate  54 MG tablet Take 54 mg by mouth every evening. 07/15/22   [provider]  folic acid  (FOLVITE ) 1 MG tablet Take 1 tablet (1 mg total) by mouth daily. 07/10/23   Regalado, Clifford Dam A, MD  hydrALAZINE  (APRESOLINE ) 100 MG tablet TAKE 1 TABLET THREE TIMES A DAY 06/25/23   Sheryle Donning, MD  Hyoscyamine  Sulfate SL (OSCIMIN ) 0.125 MG SUBL Take 0.125 mg by mouth as needed (cramping). 12/05/21   [provider]  isosorbide  dinitrate (ISORDIL ) 10 MG tablet Take 10 mg by mouth 2 (two) times daily. 12/31/21   [provider]  latanoprost  (XALATAN ) 0.005 % ophthalmic solution Place 1 drop into both eyes at bedtime. 08/31/22   [provider]  levothyroxine  (SYNTHROID ) 112 MCG tablet Take 112 mcg by mouth daily before breakfast. 07/24/22   [provider]  Methenamine-Sodium Salicylate (CYSTEX PO) Take 1 Capful by mouth daily.    [provider]  ondansetron  (ZOFRAN ) 4 MG tablet TAKE 1 TABLET EVERY 8 HOURS AS NEEDED FOR NAUSEA OR VOMITING Patient taking differently: Take 4 mg by mouth every 8 (eight) hours as needed for nausea or vomiting. 12/07/22   Daina Drum, MD  pilocarpine  (PILOCAR) 1 % ophthalmic solution Place 1 drop into the right eye 2 (two) times daily. 08/31/22   [provider]  polyethylene glycol (MIRALAX  / GLYCOLAX ) 17 g packet Take 17 g by mouth 2 (  two) times daily. 07/13/22   Danford, Willis Harter, MD  polyvinyl alcohol  (ARTIFICIAL TEARS) 1.4 % ophthalmic solution Place 1 drop into both eyes as needed for dry eyes.    [provider]  Prucalopride Succinate  (MOTEGRITY ) 2 MG TABS TAKE 1 TABLET(2 MG) BY MOUTH DAILY Patient taking differently: Take 1 tablet by mouth at bedtime. 04/08/23   Pyrtle, Amber Bail, MD  sulfamethoxazole -trimethoprim  (BACTRIM  DS) 800-160 MG tablet Take 1 tablet by mouth 2 (two) times  daily for 7 days. 08/15/23 08/22/23  Alissa April, MD  TRULANCE  3 MG TABS Take 1 tablet by mouth daily. 04/02/23   [provider]  Vitamin D, Ergocalciferol, (DRISDOL) 1.25 MG (50000 UNIT) CAPS capsule Take 50,000 Units by mouth every 7 (seven) days. 05/22/21   [provider]      Allergies    Cephalexin, Hydrocodone-acetaminophen , Ativan [lorazepam], Augmentin  [amoxicillin -pot clavulanate], Avelox [moxifloxacin], Brimonidine, Chlorzoxazone, Ciprofloxacin hcl, Clavulanic acid, Crestor [rosuvastatin], Ezetimibe, Gluten meal, Lansoprazole, Nitrofuran derivatives, Phenergan  [promethazine  hcl], Wheat, Codeine, Darvon [propoxyphene hcl], and Doxycycline     Review of Systems   Review of Systems  Gastrointestinal:  Positive for abdominal pain.  Genitourinary:  Positive for flank pain.    Physical Exam Updated Vital Signs BP (!) 154/126   Pulse 60   Temp 98 F (36.7 C)   Resp 18   SpO2 98%  Physical Exam Vitals and nursing note reviewed.  Constitutional:      Comments: Frail-appearing  HENT:     Nose: Nose normal.     Mouth/Throat:     Mouth: Mucous membranes are moist.  Eyes:     Conjunctiva/sclera: Conjunctivae normal.  Cardiovascular:     Rate and Rhythm: Normal rate and regular rhythm.  Pulmonary:     Effort: Pulmonary effort is normal.     Breath sounds: Normal breath sounds.  Abdominal:     General: Abdomen is flat. There is no distension.     Palpations: Abdomen is soft.     Tenderness: There is no abdominal tenderness. There is no guarding or rebound.  Musculoskeletal:     Right lower leg: No edema.     Left lower leg: No edema.  Skin:    General: Skin is warm.     Capillary Refill: Capillary refill takes less than 2 seconds.  Neurological:     Mental Status: She is alert and oriented to person, place, and time.  Psychiatric:        Mood and Affect: Mood normal.        Behavior: Behavior normal.     ED Results / Procedures / Treatments    Labs (all labs ordered are listed, but only abnormal results are displayed) Labs Reviewed  COMPREHENSIVE METABOLIC PANEL WITH GFR - Abnormal; Notable for the following components:      Result Value   Sodium 134 (*)    Creatinine, Ser 1.25 (*)    Total Protein 6.4 (*)    Albumin 3.3 (*)    AST 80 (*)    GFR, Estimated 42 (*)    All other components within normal limits  CBC WITH DIFFERENTIAL/PLATELET - Abnormal; Notable for the following components:   RBC 3.68 (*)    Hemoglobin 11.2 (*)    HCT 35.7 (*)    RDW 15.9 (*)    Lymphs Abs 0.6 (*)    All other components within normal limits  PROTIME-INR - Abnormal; Notable for the following components:   Prothrombin Time 20.2 (*)  INR 1.7 (*)    All other components within normal limits  URINALYSIS, W/ REFLEX TO CULTURE (INFECTION SUSPECTED) - Abnormal; Notable for the following components:   APPearance HAZY (*)    Hgb urine dipstick MODERATE (*)    Protein, ur 100 (*)    Leukocytes,Ua SMALL (*)    Bacteria, UA MANY (*)    All other components within normal limits  I-STAT CG4 LACTIC ACID, ED - Abnormal; Notable for the following components:   Lactic Acid, Venous 0.4 (*)    All other components within normal limits  I-STAT CG4 LACTIC ACID, ED - Abnormal; Notable for the following components:   Lactic Acid, Venous 0.4 (*)    All other components within normal limits  CULTURE, BLOOD (ROUTINE X 2)  CULTURE, BLOOD (ROUTINE X 2)  URINE CULTURE  CBC  BASIC METABOLIC PANEL WITH GFR    EKG EKG Interpretation Date/Time:  Monday August 16 2023 16:52:39 EDT Ventricular Rate:  57 PR Interval:    QRS Duration:  110 QT Interval:  474 QTC Calculation: 462 R Axis:   -19  Text Interpretation: Normal sinus rhythm Borderline left axis deviation RSR' in V1 or V2, probably normal variant Confirmed by Elise Guile [phone]) on 08/16/2023 6:11:40 PM  Radiology DG Chest Port 1 View Result Date: 08/16/2023 CLINICAL DATA:  Questionable sepsis.  EXAM: PORTABLE CHEST 1 VIEW COMPARISON:  04/12/2023 FINDINGS: Stable enlarged cardiac silhouette. No effusion, infiltrate, pneumothorax. Bilateral shoulder prosthetics. Surgical clips through the gastric cardiac region. IMPRESSION: 1. No acute cardiopulmonary process. 2. Cardiomegaly. Electronically Signed   By: Deboraha Fallow M.D.   On: 08/16/2023 17:24    Procedures Procedures    Medications Ordered in ED Medications  cefTRIAXone  (ROCEPHIN ) 1 g in sodium chloride  0.9 % 100 mL IVPB (has no administration in time range)  sodium chloride  flush (NS) 0.9 % injection 3 mL (has no administration in time range)  acetaminophen  (TYLENOL ) tablet 650 mg (has no administration in time range)    Or  acetaminophen  (TYLENOL ) suppository 650 mg (has no administration in time range)  senna-docusate (Senokot-S) tablet 1 tablet (has no administration in time range)  bisacodyl  (DULCOLAX) EC tablet 5 mg (has no administration in time range)  ondansetron  (ZOFRAN ) tablet 4 mg (has no administration in time range)    Or  ondansetron  (ZOFRAN ) injection 4 mg (has no administration in time range)    ED Course/ Medical Decision Making/ A&P Clinical Course as of 08/16/23 2315  Mon Aug 16, 2023  2314 Patient workup is largely reassuring.  Does have urinary tract infection.  Other than initial hypothermia, does not meet markers of sepsis with no white count no elevated lactate.  No significant metabolic derangements.  Normal kidney function.  Chest x-ray without pneumonia pneumothorax on my independent interpretation.  Per chart review last urine culture resistant to essentially all oral medications.  Does have complicated history per chart review with recurrent infections.  Patient is already having worsening of symptoms despite being treated with Bactrim .  Given her age and comorbid medical conditions is at high risk for decompensation and warrants admission for observation and IV antibiotics until urine cultures  result. [TY]    Clinical Course User Index [TY] Rolinda Climes, DO                                 Medical Decision Making This is a 88 year old female with complex past  medical history to include hypertension, atrial fibrillation, CHF, CAD and recurrent urinary tract infections presenting emergency department with generalized malaise, and dysuria.  Per chart review he was recently placed on Bactrim , further chart review appears that she had urine culture on 3/5 with Klebsiella; seemingly resistant to Bactrim  at that time.  She is borderline hypothermic, but nontachycardic normotensive.  Nontoxic-appearing on exam.  Will get screening labs and urine for sepsis.  See ED course for further MDM disposition.  Amount and/or Complexity of Data Reviewed Independent Historian:     Details: Family member noted that patient with complicated history of urinary tract infections and follows with infectious disease and urology. External Data Reviewed:     Details: See above Labs: ordered. Decision-making details documented in ED Course. Radiology: ordered. Decision-making details documented in ED Course. ECG/medicine tests:  Decision-making details documented in ED Course.  Risk Decision regarding hospitalization. Diagnosis or treatment significantly limited by social determinants of health. Risk Details: Poor health literacy.  Advanced age with HOH.          Final Clinical Impression(s) / ED Diagnoses Final diagnoses:  Acute UTI    Rx / DC Orders ED Discharge Orders     None         Rolinda Climes, DO 08/16/23 2315

## 2023-08-16 NOTE — H&P (Signed)
 History and Physical    [name] Hickman:[id number] DOB: Jan 02, 1936 DOA: 08/16/2023  PCP: Lieutenant Reese., MD  Patient coming from: Home  I have personally briefly reviewed patient's old medical records in [facility]  Chief Complaint: Dysuria, chills, flank pain  HPI: Amy Hickman is a 88 y.o. female with medical history significant for paroxysmal atrial fibrillation on Eliquis , chronic HFmrEF, nonobstructive CAD, CKD stage IIIb, HTN, hypothyroidism, OSA on CPAP, recurrent UTI with chronic interstitial cystitis who presented to the ED for evaluation of persistent dysuria with new bilateral flank pain and chills.  Patient with recurrent UTI and chronic interstitial cystitis who follows with infectious disease and urology closely.  She was seen by [facility] on 4/21.  Urine culture was obtained and grew Klebsiella pneumoniae resistant to ampicillin, intermediate resistance to nitrofurantoin, otherwise susceptible.  Per family she was not started on antibiotics at the time other than continuing on methenamine.  She was seen in the ED on 4/27 for persistent dysuria.  She was started on Bactrim  based on recent urine culture sensitivities.  Patient states that she has been having persistent dysuria and increased urinary frequency.  She is having suprapubic as well as new bilateral flank pain.  She reports chronic chills, seems to be more notable recently.  She has been lethargic and generally weak.  ED Course  Labs/Imaging on admission: I have personally reviewed following labs and imaging studies.  Initial vitals showed BP 149/71, pulse 56, RR 18, temp 96.4 F, SpO2 98% on room air.  Labs showed WBC 5.9, hemoglobin 11.2, platelets 232,000, sodium 134, potassium 4.2, bicarb 23, BUN 21, creatinine 1.25, serum glucose 93, lactic acid 0.4 x 2.  UA showed negative nitrites, small leukocytes, >50 RBCs, 21-50 WBCs, many bacteria.  Urine and blood cultures in  process.  Clinical chest x-ray negative for focal consolidation, edema, effusion.  Patient was given 1 g IV cefepime .  The hospitalist service was consulted to admit.  Review of Systems: All systems reviewed and are negative except as documented in history of present illness above.   Past Medical History:  Diagnosis Date   Atrial fibrillation, chronic (HCC)    s/p multiple cardioversions with sustained NSR, on Eliquis    Celiac disease    Chronic systolic CHF (congestive heart failure) (HCC)    Coronary artery disease involving native coronary artery of native heart without angina pectoris 03/15/2021   GERD (gastroesophageal reflux disease)    HOH (hard of hearing)    Hyperparathyroidism (HCC) 12/12/2020   Hypertension    Interstitial cystitis    Recurrent UTI 08/09/2023    Past Surgical History:  Procedure Laterality Date   ABDOMINAL HYSTERECTOMY     APPENDECTOMY     CARDIOVERSION N/A 04/24/2021   Procedure: CARDIOVERSION;  Surgeon: Hugh Madura, MD;  Location: [facility] ENDOSCOPY;  Service: Cardiovascular;  Laterality: N/A;   CARDIOVERSION N/A 06/12/2021   Procedure: CARDIOVERSION;  Surgeon: Sheryle Donning, MD;  Location: [facility] ENDOSCOPY;  Service: Cardiovascular;  Laterality: N/A;   CARDIOVERSION N/A 08/11/2021   Procedure: CARDIOVERSION;  Surgeon: Sonny Dust, MD;  Location: [facility] ENDOSCOPY;  Service: Cardiovascular;  Laterality: N/A;   CHOLECYSTECTOMY     ear drum surgery     REPLACEMENT TOTAL KNEE     ROTATOR CUFF REPAIR Right    TONSILLECTOMY      Social History: Social History   Tobacco Use   Smoking status: Former    Current packs/day: 0.00    Average packs/day:  2.0 packs/day for 33.0 years (66.0 ttl pk-yrs)    Types: Cigarettes    Start date: 53    Quit date: 42    Years since quitting: 44.3   Smokeless tobacco: Never  Vaping Use   Vaping status: Never Used  Substance Use Topics   Alcohol  use: Not Currently    Comment: rare   Drug use: No     Allergies  Allergen Reactions   Cephalexin Other (See Comments)    Abdominal pain , GI distress, Abdominal pain , GI distress   Hydrocodone-Acetaminophen  Other (See Comments) and Rash    Palpitations, flushing, hyperactivity, Chest pain, Chest pain   Ativan [Lorazepam] Other (See Comments)    "Knocked her out, woke up 3 days later"   Augmentin  [Amoxicillin -Pot Clavulanate] Nausea And Vomiting   Avelox [Moxifloxacin] Itching and Nausea Only   Brimonidine Itching and Swelling    redness   Chlorzoxazone Other (See Comments)    Went crazy/loopy   Ciprofloxacin Hcl Itching and Nausea Only   Clavulanic Acid Other (See Comments)     Unknown   Crestor [Rosuvastatin] Other (See Comments)    myalgia   Ezetimibe Diarrhea    Stomach cramps   Gluten Meal Other (See Comments)    Diarrhea, hot/cold sweat, will sleep for a couple of hours - Celiac disease   Lansoprazole Other (See Comments)    GI intolerance   Nitrofuran Derivatives Itching and Nausea Only   Phenergan  [Promethazine  Hcl] Other (See Comments)    hallucinations   Wheat Other (See Comments)    Gi distress- celiac disease   Codeine Palpitations   Darvon [Propoxyphene Hcl] Palpitations   Doxycycline  Itching and Nausea Only    Family History  Problem Relation Age of Onset   Liver cancer Sister    Bone cancer Sister    Bladder Cancer Sister    Brain cancer Sister    Lung cancer Sister    Heart disease Sister    Hypertension Sister    Pancreatic cancer Brother    Liver cancer Brother    Lung cancer Brother    Hypertension Other    Colon cancer Neg Hx    Colon polyps Neg Hx    Esophageal cancer Neg Hx    Rectal cancer Neg Hx    Stomach cancer Neg Hx      Prior to Admission medications   Medication Sig Start Date End Date Taking? Authorizing Provider  acetaminophen  (TYLENOL ) 500 MG tablet Take 1 tablet (500 mg total) by mouth every 6 (six) hours as needed for pain. Patient taking differently: Take 1,000 mg by  mouth as needed for pain or moderate pain (pain score 4-6). 06/24/12   Dorenda Gandy, MD  amiodarone  (PACERONE ) 200 MG tablet TAKE 1 TABLET DAILY 08/02/23   Sheryle Donning, MD  carvedilol  (COREG ) 12.5 MG tablet Take 1 tablet (12.5 mg total) by mouth 2 (two) times daily. 12/08/22   Sheryle Donning, MD  clidinium-chlordiazePOXIDE  (LIBRAX) 5-2.5 MG capsule Take 2 capsules by mouth daily as needed (cramping). 02/28/21   [provider]  cyanocobalamin  (VITAMIN B12) 500 MCG tablet Take 1 tablet (500 mcg total) by mouth daily. 07/10/23   Regalado, Belkys A, MD  diltiazem  (CARDIZEM  CD) 360 MG 24 hr capsule Take 1 capsule (360 mg total) by mouth daily. 06/12/21   Sheryle Donning, MD  ELIQUIS  5 MG TABS tablet TAKE 1 TABLET TWICE A DAY 06/15/23   Sheryle Donning, MD  esomeprazole (NEXIUM) 40 MG capsule Take 40  mg by mouth daily before breakfast.    [provider]  fenofibrate  54 MG tablet Take 54 mg by mouth every evening. 07/15/22   [provider]  folic acid  (FOLVITE ) 1 MG tablet Take 1 tablet (1 mg total) by mouth daily. 07/10/23   Regalado, Clifford Dam A, MD  hydrALAZINE  (APRESOLINE ) 100 MG tablet TAKE 1 TABLET THREE TIMES A DAY 06/25/23   Sheryle Donning, MD  Hyoscyamine  Sulfate SL (OSCIMIN ) 0.125 MG SUBL Take 0.125 mg by mouth as needed (cramping). 12/05/21   [provider]  isosorbide  dinitrate (ISORDIL ) 10 MG tablet Take 10 mg by mouth 2 (two) times daily. 12/31/21   [provider]  latanoprost  (XALATAN ) 0.005 % ophthalmic solution Place 1 drop into both eyes at bedtime. 08/31/22   [provider]  levothyroxine  (SYNTHROID ) 112 MCG tablet Take 112 mcg by mouth daily before breakfast. 07/24/22   [provider]  Methenamine-Sodium Salicylate (CYSTEX PO) Take 1 Capful by mouth daily.    [provider]  ondansetron  (ZOFRAN ) 4 MG tablet TAKE 1 TABLET EVERY 8 HOURS AS NEEDED FOR NAUSEA OR VOMITING Patient taking  differently: Take 4 mg by mouth every 8 (eight) hours as needed for nausea or vomiting. 12/07/22   Daina Drum, MD  pilocarpine  (PILOCAR) 1 % ophthalmic solution Place 1 drop into the right eye 2 (two) times daily. 08/31/22   [provider]  polyethylene glycol (MIRALAX  / GLYCOLAX ) 17 g packet Take 17 g by mouth 2 (two) times daily. 07/13/22   Danford, Willis Harter, MD  polyvinyl alcohol  (ARTIFICIAL TEARS) 1.4 % ophthalmic solution Place 1 drop into both eyes as needed for dry eyes.    [provider]  Prucalopride Succinate  (MOTEGRITY ) 2 MG TABS TAKE 1 TABLET(2 MG) BY MOUTH DAILY Patient taking differently: Take 1 tablet by mouth at bedtime. 04/08/23   Pyrtle, Amber Bail, MD  sulfamethoxazole -trimethoprim  (BACTRIM  DS) 800-160 MG tablet Take 1 tablet by mouth 2 (two) times daily for 7 days. 08/15/23 08/22/23  Alissa April, MD  TRULANCE  3 MG TABS Take 1 tablet by mouth daily. 04/02/23   [provider]  Vitamin D, Ergocalciferol, (DRISDOL) 1.25 MG (50000 UNIT) CAPS capsule Take 50,000 Units by mouth every 7 (seven) days. 05/22/21   [provider]    Physical Exam: Vitals:   08/16/23 2000 08/16/23 2215 08/16/23 2245 08/16/23 2300  BP: (!) 156/70 (!) 163/73 (!) 169/74 (!) 154/126  Pulse: 61 60 60   Resp: 18 18  18   Temp: 97.7 F (36.5 C)   98 F (36.7 C)  TempSrc:      SpO2: 96% 96% 96% 98%   Constitutional: Resting supine in bed, NAD, calm, comfortable Eyes: EOMI, lids and conjunctivae normal ENMT: Mucous membranes are moist. Posterior pharynx clear of any exudate or lesions.Normal dentition.  Hearing aid in place. Neck: normal, supple, no masses. Respiratory: clear to auscultation bilaterally, no wheezing, no crackles. Normal respiratory effort. No accessory muscle use.  Cardiovascular: Regular rate and rhythm, systolic murmur present. No extremity edema. 2+ pedal pulses. Abdomen: Suprapubic tenderness, no masses palpated. Musculoskeletal: no clubbing /  cyanosis. No joint deformity upper and lower extremities. Good ROM, no contractures. Normal muscle tone.  Skin: no rashes, lesions, ulcers. No induration Neurologic: Sensation intact. Strength 5/5 in all 4.  Psychiatric: Normal judgment and insight. Alert and oriented x 3. Normal mood.   EKG: Personally reviewed. Sinus rhythm, rate 57, no acute ischemic changes.  Motion artifact.  Assessment/Plan Principal Problem:  Urinary tract infection due to Klebsiella species Active Problems:   Chronic heart failure with mildly reduced ejection fraction (HFmrEF, 41-49%) (HCC)   Hypothyroidism   Essential hypertension   Paroxysmal atrial fibrillation (HCC)   Chronic kidney disease, stage 3b (HCC)   OSA (obstructive sleep apnea)   Amy Hickman is a 88 y.o. female with medical history significant for paroxysmal atrial fibrillation on Eliquis , chronic HFmrEF, nonobstructive CAD, CKD stage IIIb, HTN, hypothyroidism, OSA on CPAP, recurrent UTI with chronic interstitial cystitis who is admitted with UTI.  Assessment and Plan: Recurrent UTI with chronic interstitial cystitis History of ESBL with colonization: Presenting with persistent dysuria, suprapubic pain, worsening lethargy and new bilateral flank pain suggestive of potential pyelonephritis.  Urine culture 4/21 in Care Everywhere grew Klebsiella pneumoniae resistant to ampicillin and intermediate to nitrofurantoin otherwise sensitive.  Patient received IV cefepime  in the ED. - Narrow antibiotics to IV ceftriaxone  based on most recent urine culture - Follow repeat urine and blood cultures  Paroxysmal atrial fibrillation: Stable in sinus rhythm on admission.  Continue Coreg , amiodarone , diltiazem , Eliquis .  Chronic HFmrEF: Appears euvolemic on admission.  Not on diuretics as an outpatient.  Last EF 40-45% in 2023.  Continue Coreg .  CKD stage IIIb: Renal function stable.  Hypertension: Continue Coreg , diltiazem , Isordil ,  hydralazine .  CAD: Stable, denies chest pain.  Continue Eliquis .  Not currently on statin.  Hypothyroidism: Continue Synthroid .  OSA: Continue CPAP nightly.   DVT prophylaxis:  apixaban  (ELIQUIS ) tablet 5 mg   Code Status: Full code, confirmed with patient on admission Family Communication: Daughter at bedside Disposition Plan: From home and likely discharge to home pending clinical progress Consults called: None Severity of Illness: The appropriate patient status for this patient is OBSERVATION. Observation status is judged to be reasonable and necessary in order to provide the required intensity of service to ensure the patient's safety. The patient's presenting symptoms, physical exam findings, and initial radiographic and laboratory data in the context of their medical condition is felt to place them at decreased risk for further clinical deterioration. Furthermore, it is anticipated that the patient will be medically stable for discharge from the hospital within 2 midnights of admission.   Edith Gores MD [facility]  If 7PM-7AM, please contact night-coverage [email or web address]  08/16/2023, 11:42 PM

## 2023-08-16 NOTE — Telephone Encounter (Signed)
 Patient's daughter Betty Bruckner called for Dr. Sanjuan Crumbly opinion regarding recent hospital visit to [facility] and symptoms.  Advised she go to ED (/ [facility]). Verbalized understanding.  Julien Odor, RMA

## 2023-08-17 ENCOUNTER — Encounter (HOSPITAL_COMMUNITY): Payer: Self-pay | Admitting: Internal Medicine

## 2023-08-17 ENCOUNTER — Ambulatory Visit: Admitting: Physical Therapy

## 2023-08-17 ENCOUNTER — Other Ambulatory Visit: Payer: Self-pay

## 2023-08-17 DIAGNOSIS — Z888 Allergy status to other drugs, medicaments and biological substances status: Secondary | ICD-10-CM | POA: Diagnosis not present

## 2023-08-17 DIAGNOSIS — Z800 Family history of malignant neoplasm of digestive organs: Secondary | ICD-10-CM | POA: Diagnosis not present

## 2023-08-17 DIAGNOSIS — Z7901 Long term (current) use of anticoagulants: Secondary | ICD-10-CM | POA: Diagnosis not present

## 2023-08-17 DIAGNOSIS — G4733 Obstructive sleep apnea (adult) (pediatric): Secondary | ICD-10-CM | POA: Diagnosis present

## 2023-08-17 DIAGNOSIS — I5022 Chronic systolic (congestive) heart failure: Secondary | ICD-10-CM | POA: Diagnosis present

## 2023-08-17 DIAGNOSIS — Z880 Allergy status to penicillin: Secondary | ICD-10-CM | POA: Diagnosis not present

## 2023-08-17 DIAGNOSIS — Z8249 Family history of ischemic heart disease and other diseases of the circulatory system: Secondary | ICD-10-CM | POA: Diagnosis not present

## 2023-08-17 DIAGNOSIS — I350 Nonrheumatic aortic (valve) stenosis: Secondary | ICD-10-CM | POA: Diagnosis present

## 2023-08-17 DIAGNOSIS — I480 Paroxysmal atrial fibrillation: Secondary | ICD-10-CM | POA: Diagnosis present

## 2023-08-17 DIAGNOSIS — N390 Urinary tract infection, site not specified: Secondary | ICD-10-CM | POA: Diagnosis not present

## 2023-08-17 DIAGNOSIS — Z883 Allergy status to other anti-infective agents status: Secondary | ICD-10-CM | POA: Diagnosis not present

## 2023-08-17 DIAGNOSIS — Z79899 Other long term (current) drug therapy: Secondary | ICD-10-CM | POA: Diagnosis not present

## 2023-08-17 DIAGNOSIS — I2510 Atherosclerotic heart disease of native coronary artery without angina pectoris: Secondary | ICD-10-CM | POA: Diagnosis present

## 2023-08-17 DIAGNOSIS — I130 Hypertensive heart and chronic kidney disease with heart failure and stage 1 through stage 4 chronic kidney disease, or unspecified chronic kidney disease: Secondary | ICD-10-CM | POA: Diagnosis present

## 2023-08-17 DIAGNOSIS — B9689 Other specified bacterial agents as the cause of diseases classified elsewhere: Secondary | ICD-10-CM | POA: Diagnosis not present

## 2023-08-17 DIAGNOSIS — E039 Hypothyroidism, unspecified: Secondary | ICD-10-CM | POA: Diagnosis present

## 2023-08-17 DIAGNOSIS — B961 Klebsiella pneumoniae [K. pneumoniae] as the cause of diseases classified elsewhere: Secondary | ICD-10-CM | POA: Diagnosis present

## 2023-08-17 DIAGNOSIS — Z1611 Resistance to penicillins: Secondary | ICD-10-CM | POA: Diagnosis present

## 2023-08-17 DIAGNOSIS — Z885 Allergy status to narcotic agent status: Secondary | ICD-10-CM | POA: Diagnosis not present

## 2023-08-17 DIAGNOSIS — Z87891 Personal history of nicotine dependence: Secondary | ICD-10-CM | POA: Diagnosis not present

## 2023-08-17 DIAGNOSIS — I482 Chronic atrial fibrillation, unspecified: Secondary | ICD-10-CM | POA: Diagnosis present

## 2023-08-17 DIAGNOSIS — N3000 Acute cystitis without hematuria: Secondary | ICD-10-CM | POA: Diagnosis present

## 2023-08-17 DIAGNOSIS — Z79890 Hormone replacement therapy (postmenopausal): Secondary | ICD-10-CM | POA: Diagnosis not present

## 2023-08-17 DIAGNOSIS — Z801 Family history of malignant neoplasm of trachea, bronchus and lung: Secondary | ICD-10-CM | POA: Diagnosis not present

## 2023-08-17 DIAGNOSIS — Z881 Allergy status to other antibiotic agents status: Secondary | ICD-10-CM | POA: Diagnosis not present

## 2023-08-17 DIAGNOSIS — N1832 Chronic kidney disease, stage 3b: Secondary | ICD-10-CM | POA: Diagnosis present

## 2023-08-17 LAB — CBC
HCT: 34.3 % — ABNORMAL LOW (ref 36.0–46.0)
Hemoglobin: 10.7 g/dL — ABNORMAL LOW (ref 12.0–15.0)
MCH: 30.3 pg (ref 26.0–34.0)
MCHC: 31.2 g/dL (ref 30.0–36.0)
MCV: 97.2 fL (ref 80.0–100.0)
Platelets: 227 10*3/uL (ref 150–400)
RBC: 3.53 MIL/uL — ABNORMAL LOW (ref 3.87–5.11)
RDW: 16.1 % — ABNORMAL HIGH (ref 11.5–15.5)
WBC: 7.1 10*3/uL (ref 4.0–10.5)
nRBC: 0 % (ref 0.0–0.2)

## 2023-08-17 LAB — TROPONIN I (HIGH SENSITIVITY): Troponin I (High Sensitivity): 9 ng/L (ref <18)

## 2023-08-17 LAB — BASIC METABOLIC PANEL WITH GFR
Anion gap: 12 (ref 5–15)
BUN: 19 mg/dL (ref 8–23)
CO2: 20 mmol/L — ABNORMAL LOW (ref 22–32)
Calcium: 9.5 mg/dL (ref 8.9–10.3)
Chloride: 103 mmol/L (ref 98–111)
Creatinine, Ser: 0.88 mg/dL (ref 0.44–1.00)
GFR, Estimated: >60 mL/min (ref >60)
Glucose, Bld: 82 mg/dL (ref 70–99)
Potassium: 3.9 mmol/L (ref 3.5–5.1)
Sodium: 135 mmol/L (ref 135–145)

## 2023-08-17 LAB — BRAIN NATRIURETIC PEPTIDE: B Natriuretic Peptide: 468.6 pg/mL — ABNORMAL HIGH (ref 0.0–100.0)

## 2023-08-17 MED ORDER — HYOSCYAMINE SULFATE 0.125 MG SL SUBL: 0.1250 mg | SUBLINGUAL_TABLET | SUBLINGUAL | Status: DC | PRN

## 2023-08-17 MED ORDER — POLYETHYLENE GLYCOL 3350 17 G PO PACK
17.0000 g | PACK | Freq: Two times a day (BID) | ORAL | Status: DC
  Filled 2023-08-17 (×3): qty 1

## 2023-08-17 MED ORDER — LEVOTHYROXINE SODIUM 112 MCG PO TABS
112.0000 ug | ORAL_TABLET | Freq: Every day | ORAL | Status: DC
  Administered 2023-08-18 – 2023-08-19 (×2): 112 ug via ORAL
  Filled 2023-08-17 (×2): qty 1

## 2023-08-17 MED ORDER — VITAMIN B-12 1000 MCG PO TABS
500.0000 ug | ORAL_TABLET | Freq: Every day | ORAL | Status: DC
  Administered 2023-08-17 – 2023-08-19 (×3): 500 ug via ORAL
  Filled 2023-08-17 (×3): qty 1

## 2023-08-17 MED ORDER — LATANOPROST 0.005 % OP SOLN
1.0000 [drp] | Freq: Every day | OPHTHALMIC | Status: DC
  Administered 2023-08-17 – 2023-08-18 (×2): 1 [drp] via OPHTHALMIC
  Filled 2023-08-17: qty 2.5

## 2023-08-17 MED ORDER — PILOCARPINE HCL 1 % OP SOLN
1.0000 [drp] | Freq: Two times a day (BID) | OPHTHALMIC | Status: DC
  Administered 2023-08-17 – 2023-08-19 (×5): 1 [drp] via OPHTHALMIC
  Filled 2023-08-17 (×2): qty 15

## 2023-08-17 MED ORDER — POLYVINYL ALCOHOL 1.4 % OP SOLN: 1.0000 [drp] | OPHTHALMIC | Status: DC | PRN

## 2023-08-17 MED ORDER — FENOFIBRATE 54 MG PO TABS
54.0000 mg | ORAL_TABLET | Freq: Every evening | ORAL | Status: DC
  Administered 2023-08-17 – 2023-08-18 (×2): 54 mg via ORAL
  Filled 2023-08-17 (×2): qty 1

## 2023-08-17 MED ORDER — PANTOPRAZOLE SODIUM 40 MG PO TBEC
80.0000 mg | DELAYED_RELEASE_TABLET | Freq: Every day | ORAL | Status: DC
  Administered 2023-08-17 – 2023-08-19 (×3): 80 mg via ORAL
  Filled 2023-08-17 (×3): qty 2

## 2023-08-17 MED ORDER — PRUCALOPRIDE SUCCINATE 2 MG PO TABS: 1.0000 | ORAL_TABLET | Freq: Every day | ORAL | Status: DC

## 2023-08-17 NOTE — Plan of Care (Signed)

## 2023-08-17 NOTE — Progress Notes (Signed)
   08/17/23 2123  BiPAP/CPAP/SIPAP  BiPAP/CPAP/SIPAP Pt Type Adult (Patient using her home equipment and prefers self placement when ready for bed.  mask and machine within reach)  BiPAP/CPAP/SIPAP Resmed  Mask Type Nasal mask  Dentures removed? Yes - Placed in denture cup  FiO2 (%) 21 %  Heater Temperature  (sterile water added to humidifier)  Patient Home Machine Yes  Safety Check Completed by RT for Home Unit Yes, no issues noted  Patient Home Mask Yes  Patient Home Tubing Yes  CPAP/SIPAP surface wiped down Yes  Device Plugged into RED Power Outlet Yes

## 2023-08-17 NOTE — Progress Notes (Signed)
   08/17/23 1103  TOC Brief Assessment  Insurance and Status Reviewed  Patient has primary care physician Yes  Home environment has been reviewed single family home  Prior level of function: independent  Prior/Current Home Services No current home services  Social Drivers of Health Review SDOH reviewed no interventions necessary  Readmission risk has been reviewed Yes  Transition of care needs no transition of care needs at this time    [place], LCSW 08/17/2023 11:04 AM

## 2023-08-17 NOTE — Progress Notes (Signed)
 PROGRESS NOTE  Amy Hickman:[id number] DOB: February 19, 1936 DOA: 08/16/2023 PCP: Lieutenant Reese., MD  HPI/Recap of past 24 hours: Amy Hickman is a 88 y.o. female with medical history significant for paroxysmal atrial fibrillation on Eliquis , chronic HFmrEF, nonobstructive CAD, CKD stage IIIb, HTN, hypothyroidism, OSA on CPAP, recurrent UTI with chronic interstitial cystitis who presented to the ED for evaluation of persistent dysuria with new bilateral flank pain, chills and generalized weakness for the past few days. Patient with recurrent UTI and chronic interstitial cystitis who follows with infectious disease and urology closely.  She was seen by [facility] on 4/21.  Urine culture was obtained and grew Klebsiella pneumoniae, not started on antibiotics at the time other than continuing on methenamine. She was seen in the ED on 4/27 for persistent dysuria.  She was started on Bactrim  based on recent urine culture sensitivities.  In the ED, vital signs fairly stable, labs fairly unremarkable. UA showed negative nitrites, small leukocytes, >50 RBCs, 21-50 WBCs, many bacteria. Chest x-ray negative for focal consolidation, edema, effusion.  Patient admitted for further management.    Today, patient reporting some reproducible left-sided chest pain, generalized weakness.  Denies any shortness of breath, nausea/vomiting, fever.     Assessment/Plan: Principal Problem:   Urinary tract infection due to Klebsiella species Active Problems:   Chronic heart failure with mildly reduced ejection fraction (HFmrEF, 41-49%) (HCC)   Hypothyroidism   Essential hypertension   Paroxysmal atrial fibrillation (HCC)   Chronic kidney disease, stage 3b (HCC)   OSA (obstructive sleep apnea)   Recurrent UTI with chronic interstitial cystitis History of ESBL with colonization Currently afebrile, with no leukocytosis UA showed negative nitrites, small leukocytes, >50 RBCs, 21-50 WBCs, many  bacteria Urine culture, blood culture x 2 pending Continue IV ceftriaxone  (urine culture on 4/21 from outside report was sensitive to ceftriaxone ), for now pending results  ??Reproducible left-sided chest pain Chronic HFrEF CAD Troponin negative BNP elevated at 468 Chest x-ray unremarkable Echo done in 2023 showed EF of 40 to 45%, noted global hypokinesis, left ventricular diastolic parameters indeterminate, repeat pending Not currently on statin. Telemetry   Paroxysmal atrial fibrillation Stable in sinus rhythm on admission Continue Coreg , amiodarone , diltiazem , Eliquis    CKD stage IIIb Renal function stable.   Hypertension Continue Coreg , diltiazem , Isordil , hydralazine    Hypothyroidism Continue Synthroid   IBS with constipation Bowel regimen   OSA Continue CPAP nightly.    Estimated body mass index is 23.08 kg/m as calculated from the following:   Height as of 08/14/23: 5\' 3"  (1.6 m).   Weight as of 08/14/23: 59.1 kg.     Code Status: Full  Family Communication: Daughter at bedside  Disposition Plan: Status is: Inpatient Remains inpatient appropriate because: level of care      Consultants: None  Procedures: None  Antimicrobials: Ceftriaxone   DVT prophylaxis: Eliquis    Objective: Vitals:   08/17/23 0024 08/17/23 0255 08/17/23 0837 08/17/23 1341  BP: (!) 187/72 (!) 143/65 120/68 119/66  Pulse:  65 66 64  Resp:  18 16 15   Temp:  98.3 F (36.8 C) 98.4 F (36.9 C) 98.3 F (36.8 C)  TempSrc:  Oral Oral   SpO2:  95% 93% 95%    Intake/Output Summary (Last 24 hours) at 08/17/2023 1621 Last data filed at 08/17/2023 1524 Gross per 24 hour  Intake 460 ml  Output --  Net 460 ml   There were no vitals filed for this visit.  Exam: General: NAD  Cardiovascular:  S1, S2 present Respiratory: CTAB Abdomen: Soft, nontender, distended, bowel sounds present Musculoskeletal: No bilateral pedal edema noted Skin: Normal Psychiatry: Normal mood      Data Reviewed: CBC: Recent Labs  Lab 08/14/23 2231 08/16/23 1920 08/17/23 0059  WBC 7.5 5.9 7.1  NEUTROABS 5.9 4.5  --   HGB 10.4* 11.2* 10.7*  HCT 32.4* 35.7* 34.3*  MCV 94.5 97.0 97.2  PLT 276 232 227   Basic Metabolic Panel: Recent Labs  Lab 08/14/23 2231 08/16/23 1920 08/17/23 0059  NA 136 134* 135  K 5.2* 4.2 3.9  CL 102 101 103  CO2 21* 23 20*  GLUCOSE 98 93 82  BUN 23 21 19   CREATININE 1.35* 1.25* 0.88  CALCIUM  10.3 9.9 9.5   GFR: Estimated Creatinine Clearance: 38.7 mL/min (by C-G formula based on SCr of 0.88 mg/dL). Liver Function Tests: Recent Labs  Lab 08/14/23 2231 08/16/23 1920  AST 97* 80*  ALT 38 33  ALKPHOS 118 101  BILITOT 0.3 0.8  PROT 6.1* 6.4*  ALBUMIN 3.7 3.3*   No results for input(s): "LIPASE", "AMYLASE" in the last 168 hours. No results for input(s): "AMMONIA" in the last 168 hours. Coagulation Profile: Recent Labs  Lab 08/16/23 1920  INR 1.7*   Cardiac Enzymes: No results for input(s): "CKTOTAL", "CKMB", "CKMBINDEX", "TROPONINI" in the last 168 hours. BNP (last 3 results) No results for input(s): "PROBNP" in the last 8760 hours. HbA1C: No results for input(s): "HGBA1C" in the last 72 hours. CBG: No results for input(s): "GLUCAP" in the last 168 hours. Lipid Profile: No results for input(s): "CHOL", "HDL", "LDLCALC", "TRIG", "CHOLHDL", "LDLDIRECT" in the last 72 hours. Thyroid  Function Tests: No results for input(s): "TSH", "T4TOTAL", "FREET4", "T3FREE", "THYROIDAB" in the last 72 hours. Anemia Panel: No results for input(s): "VITAMINB12", "FOLATE", "FERRITIN", "TIBC", "IRON", "RETICCTPCT" in the last 72 hours. Urine analysis:    Component Value Date/Time   COLORURINE YELLOW 08/16/2023 1840   APPEARANCEUR HAZY (A) 08/16/2023 1840   LABSPEC 1.012 08/16/2023 1840   PHURINE 6.0 08/16/2023 1840   GLUCOSEU NEGATIVE 08/16/2023 1840   HGBUR MODERATE (A) 08/16/2023 1840   BILIRUBINUR NEGATIVE 08/16/2023 1840    KETONESUR NEGATIVE 08/16/2023 1840   PROTEINUR 100 (A) 08/16/2023 1840   UROBILINOGEN 0.2 03/27/2014 2129   NITRITE NEGATIVE 08/16/2023 1840   LEUKOCYTESUR SMALL (A) 08/16/2023 1840   Sepsis Labs: @LABRCNTIP (procalcitonin:4,lacticidven:4)  ) Recent Results (from the past 240 hours)  Blood Culture (routine x 2)     Status: None (Preliminary result)   Collection Time: 08/16/23  7:20 PM   Specimen: BLOOD  Result Value Ref Range Status   Specimen Description   Final    BLOOD SITE NOT SPECIFIED Performed at [facility], [address] [place], [place] [address]    Special Requests   Final    BOTTLES DRAWN AEROBIC AND ANAEROBIC Blood Culture adequate volume Performed at [facility], [address] [place], [place] [address]    Culture   Final    NO GROWTH < 12 HOURS Performed at [facility], [address] [place], [place] [address]    Report Status PENDING  Incomplete  Blood Culture (routine x 2)     Status: None (Preliminary result)   Collection Time: 08/17/23 12:59 AM   Specimen: BLOOD RIGHT ARM  Result Value Ref Range Status   Specimen Description   Final    BLOOD RIGHT ARM Performed at [facility], [address] [place], [place] [address]    Special Requests   Final    BOTTLES DRAWN AEROBIC ONLY Blood Culture results may not be optimal due to an inadequate volume of blood received in culture bottles Performed at [facility], [address] [place], [place] [address]    Culture   Final    NO GROWTH <12 HOURS Performed at [facility], [address] [place], [place] [address]    Report Status PENDING  Incomplete      Studies: DG Chest Port 1 View Result Date: 08/16/2023 CLINICAL DATA:  Questionable sepsis. EXAM: PORTABLE CHEST 1 VIEW COMPARISON:  04/12/2023 FINDINGS: Stable enlarged cardiac silhouette. No effusion, infiltrate, pneumothorax. Bilateral shoulder  prosthetics. Surgical clips through the gastric cardiac region. IMPRESSION: 1. No acute cardiopulmonary process. 2. Cardiomegaly. Electronically Signed   By: Deboraha Fallow M.D.   On: 08/16/2023 17:24    Scheduled Meds:  amiodarone   200 mg Oral Daily   apixaban   5 mg Oral BID   carvedilol   12.5 mg Oral BID   cyanocobalamin   500 mcg Oral Daily   diltiazem   360 mg Oral Daily   fenofibrate   54 mg Oral QPM   hydrALAZINE   100 mg Oral TID   isosorbide  dinitrate  10 mg Oral BID   latanoprost   1 drop Both Eyes QHS   [START ON 08/18/2023] levothyroxine   112 mcg Oral Q0600   pantoprazole   80 mg Oral Q1200   pilocarpine   1 drop Right Eye BID   polyethylene glycol  17 g Oral BID   Prucalopride Succinate   1 tablet Oral QHS   sodium chloride  flush  3 mL Intravenous Q12H    Continuous Infusions:  cefTRIAXone  (ROCEPHIN )  IV 1 g (08/17/23 0512)     LOS: 0 days     Veronica Gordon, MD [facility]  If 7PM-7AM, please contact night-coverage [email or web address] 08/17/2023, 4:21 PM

## 2023-08-17 NOTE — Plan of Care (Signed)
  Problem: Education: Goal: Knowledge of General Education information will improve Description: Including pain rating scale, medication(s)/side effects and non-pharmacologic comfort measures Outcome: Progressing   Problem: Clinical Measurements: Goal: Diagnostic test results will improve Outcome: Progressing Goal: Respiratory complications will improve Outcome: Progressing   Problem: Nutrition: Goal: Adequate nutrition will be maintained Outcome: Progressing   Problem: Elimination: Goal: Will not experience complications related to bowel motility Outcome: Progressing Goal: Will not experience complications related to urinary retention Outcome: Progressing   Problem: Safety: Goal: Ability to remain free from injury will improve Outcome: Progressing   Problem: Skin Integrity: Goal: Risk for impaired skin integrity will decrease Outcome: Progressing

## 2023-08-18 ENCOUNTER — Inpatient Hospital Stay (HOSPITAL_COMMUNITY)

## 2023-08-18 DIAGNOSIS — B9689 Other specified bacterial agents as the cause of diseases classified elsewhere: Secondary | ICD-10-CM | POA: Diagnosis not present

## 2023-08-18 DIAGNOSIS — N390 Urinary tract infection, site not specified: Secondary | ICD-10-CM | POA: Diagnosis not present

## 2023-08-18 LAB — ECHOCARDIOGRAM COMPLETE
AR max vel: 0.87 cm2
AV Area VTI: 0.89 cm2
AV Area mean vel: 0.85 cm2
AV Mean grad: 10.8 mmHg
AV Peak grad: 20.7 mmHg
Ao pk vel: 2.27 m/s
Area-P 1/2: 4.04 cm2
Calc EF: 49.5 %
MV VTI: 1.75 cm2
P 1/2 time: 510 ms
S' Lateral: 3.5 cm
Single Plane A2C EF: 51.4 %
Single Plane A4C EF: 51.6 %
Weight: 2885.38 [oz_av]

## 2023-08-18 LAB — CBC WITH DIFFERENTIAL/PLATELET
Abs Immature Granulocytes: 0.02 10*3/uL (ref 0.00–0.07)
Basophils Absolute: 0 10*3/uL (ref 0.0–0.1)
Basophils Relative: 0 %
Eosinophils Absolute: 0.2 10*3/uL (ref 0.0–0.5)
Eosinophils Relative: 2 %
HCT: 30.6 % — ABNORMAL LOW (ref 36.0–46.0)
Hemoglobin: 9.8 g/dL — ABNORMAL LOW (ref 12.0–15.0)
Immature Granulocytes: 0 %
Lymphocytes Relative: 9 %
Lymphs Abs: 0.6 10*3/uL — ABNORMAL LOW (ref 0.7–4.0)
MCH: 30.4 pg (ref 26.0–34.0)
MCHC: 32 g/dL (ref 30.0–36.0)
MCV: 95 fL (ref 80.0–100.0)
Monocytes Absolute: 0.7 10*3/uL (ref 0.1–1.0)
Monocytes Relative: 11 %
Neutro Abs: 4.9 10*3/uL (ref 1.7–7.7)
Neutrophils Relative %: 78 %
Platelets: 215 10*3/uL (ref 150–400)
RBC: 3.22 MIL/uL — ABNORMAL LOW (ref 3.87–5.11)
RDW: 16.2 % — ABNORMAL HIGH (ref 11.5–15.5)
WBC: 6.4 10*3/uL (ref 4.0–10.5)
nRBC: 0 % (ref 0.0–0.2)

## 2023-08-18 LAB — BASIC METABOLIC PANEL WITH GFR
Anion gap: 9 (ref 5–15)
BUN: 23 mg/dL (ref 8–23)
CO2: 24 mmol/L (ref 22–32)
Calcium: 9.4 mg/dL (ref 8.9–10.3)
Chloride: 100 mmol/L (ref 98–111)
Creatinine, Ser: 1.15 mg/dL — ABNORMAL HIGH (ref 0.44–1.00)
GFR, Estimated: 47 mL/min — ABNORMAL LOW (ref >60)
Glucose, Bld: 105 mg/dL — ABNORMAL HIGH (ref 70–99)
Potassium: 3.9 mmol/L (ref 3.5–5.1)
Sodium: 133 mmol/L — ABNORMAL LOW (ref 135–145)

## 2023-08-18 LAB — URINE CULTURE: Culture: >=100000 — AB

## 2023-08-18 MED ORDER — LINEZOLID 600 MG PO TABS
600.0000 mg | ORAL_TABLET | Freq: Two times a day (BID) | ORAL | Status: DC
  Administered 2023-08-18 – 2023-08-19 (×3): 600 mg via ORAL
  Filled 2023-08-18 (×3): qty 1

## 2023-08-18 NOTE — Progress Notes (Signed)
   08/18/23 2328  BiPAP/CPAP/SIPAP  BiPAP/CPAP/SIPAP Pt Type Adult  BiPAP/CPAP/SIPAP Resmed  Mask Type Full face mask  Dentures removed? Yes - Placed in denture cup  Mask Size Medium  FiO2 (%) 21 %  Patient Home Machine Yes  Safety Check Completed by RT for Home Unit Yes, no issues noted  Patient Home Mask Yes  Patient Home Tubing Yes  Auto Titrate  (home settings)  Device Plugged into RED Power Outlet Yes

## 2023-08-18 NOTE — Progress Notes (Signed)
  Echocardiogram 2D Echocardiogram has been performed.  Rokia Bosket L Dessie Tatem RDCS 08/18/2023, 10:58 AM

## 2023-08-18 NOTE — Progress Notes (Signed)
  Progress Note   Patient: Amy Hickman ZOX:[id number] DOB: [birth year] DOA: 08/16/2023     1 DOS: the patient was seen and examined on 08/18/2023 at 9:50 AM and 2:20 PM      Brief hospital course: 88 y.o. F with hx CHF EF 40%, AS, pAF on ELIquis , CAD, CKD IIIb baseline 1.2, HTN, hypothyroidism, OSA on CPAP and chronic interstitial cystitis who presented with dysuria, malaise, fatigue and subjective fever.  Admitted on antibiotics for acute cystitis.        Assessment and Plan: Acute infectious cystitis Chronic interstitial cystitis History ESBL colonization Urine culture 3/25 with ESBL Klebsiella Urine 08/09/23 (in [facility]) with pan-sensitive Klebsiella Urine this admission with E faecium only - Stop Rocephin  - Start linezolid -Outpatient follow-up with infectious disease and urology  Chest pain Troponin negative.  ECG unremarkable.  No exertional component to this chest pain, so at present I do not suspect it is from aortic stenosis.  Aortic stenosis Echocardiogram during this hospitalization shows no regional wall motion abnormalities, shows global hypokinesis, similar EF to previous.  Valve area has decreased.  With ambulation she has no symptoms of exertional dyspnea or chest pain.  She has no syncope. - Outpatient follow-up with Dr. Veryl Gottron is pending in 1 week  Paroxysmal atrial fibrillation Chronic systolic congestive heart failure Coronary artery disease Hypertension  Rates controlled, blood pressure normal, does not appear fluid overloaded. - Continue amiodarone , carvedilol , Eliquis , hydralazine , Isordil   Hypothyroidism -Continue levothyroxine   Chronic kidney disease stage IIIb Creatinine stable within baseline range  Sleep apnea -CPAP at night        Subjective: Patient still feels very tired, very weak.  No fever, no confusion.  No vomiting.     Physical Exam: BP 126/63 (BP Location: Right Arm)   Pulse (!) 58   Temp 98 F (36.7 C)   Resp  17   Wt 81.8 kg   SpO2 96%   BMI 31.95 kg/m   Thin frail elderly female, lying in bed, interactive and appropriate RRR, I do not appreciate a systolic murmur, no peripheral edema, no JVD Respiratory rate normal, lungs clear without rales or wheezes Abdomen soft no tenderness palpation or guarding, no ascites or distention Attention normal, affect normal, judgment and insight appear normal   Data Reviewed: Basic metabolic panel shows creatinine stable at 1.1 BNP 468 Hemoglobin 9.8, no change, white blood cell count normal Troponin negative    Family Communication: Daughter at the bedside    Disposition: Status is: Inpatient         Author: Ephriam Hashimoto, MD 08/18/2023 5:28 PM  For on call review [email or web address].

## 2023-08-19 ENCOUNTER — Other Ambulatory Visit (HOSPITAL_COMMUNITY): Payer: Self-pay

## 2023-08-19 DIAGNOSIS — B9689 Other specified bacterial agents as the cause of diseases classified elsewhere: Secondary | ICD-10-CM | POA: Diagnosis not present

## 2023-08-19 DIAGNOSIS — N390 Urinary tract infection, site not specified: Secondary | ICD-10-CM | POA: Diagnosis not present

## 2023-08-19 LAB — COMPREHENSIVE METABOLIC PANEL WITH GFR
ALT: 31 U/L (ref 0–44)
AST: 82 U/L — ABNORMAL HIGH (ref 15–41)
Albumin: 2.9 g/dL — ABNORMAL LOW (ref 3.5–5.0)
Alkaline Phosphatase: 93 U/L (ref 38–126)
Anion gap: 10 (ref 5–15)
BUN: 25 mg/dL — ABNORMAL HIGH (ref 8–23)
CO2: 21 mmol/L — ABNORMAL LOW (ref 22–32)
Calcium: 9.2 mg/dL (ref 8.9–10.3)
Chloride: 100 mmol/L (ref 98–111)
Creatinine, Ser: 1.05 mg/dL — ABNORMAL HIGH (ref 0.44–1.00)
GFR, Estimated: 52 mL/min — ABNORMAL LOW (ref >60)
Glucose, Bld: 92 mg/dL (ref 70–99)
Potassium: 4 mmol/L (ref 3.5–5.1)
Sodium: 131 mmol/L — ABNORMAL LOW (ref 135–145)
Total Bilirubin: 0.4 mg/dL (ref 0.0–1.2)
Total Protein: 5.5 g/dL — ABNORMAL LOW (ref 6.5–8.1)

## 2023-08-19 LAB — CBC
HCT: 31.6 % — ABNORMAL LOW (ref 36.0–46.0)
Hemoglobin: 9.9 g/dL — ABNORMAL LOW (ref 12.0–15.0)
MCH: 30.7 pg (ref 26.0–34.0)
MCHC: 31.3 g/dL (ref 30.0–36.0)
MCV: 97.8 fL (ref 80.0–100.0)
Platelets: 197 10*3/uL (ref 150–400)
RBC: 3.23 MIL/uL — ABNORMAL LOW (ref 3.87–5.11)
RDW: 15.9 % — ABNORMAL HIGH (ref 11.5–15.5)
WBC: 6.2 10*3/uL (ref 4.0–10.5)
nRBC: 0 % (ref 0.0–0.2)

## 2023-08-19 MED ORDER — LINEZOLID 600 MG PO TABS
600.0000 mg | ORAL_TABLET | Freq: Two times a day (BID) | ORAL | 0 refills | Status: DC
  Filled 2023-08-19: qty 7, 4d supply, fill #0

## 2023-08-19 NOTE — Discharge Summary (Signed)
 Physician Discharge Summary   Patient: Amy Hickman MRN: [id number] DOB: 1935/12/20  Admit date:     86/28/2025  Discharge date: 08/19/23  Discharge Physician: Ephriam Hashimoto   PCP: Lieutenant Reese., MD     Recommendations at discharge:  Follow up with Urology and Infectious Disease for interstitial cystitis with recent infectious cystitis Follow up with PCP Dr. Laymon Priest in 1 week Dr. Laymon Priest:  Please obtain CBC and BMP (discharge Na 131, Cr 1.05, Hgb 9.9) Follow up with Cardiology Dr. Veryl Gottron for progressing aortic stenosis  Dr. Veryl Gottron: Please see repeat echo report 4/30       Discharge Diagnoses: Principal Problem:   Urinary tract infection due to Klebsiella species Active Problems:   Chronic heart failure with mildly reduced ejection fraction (HFmrEF, 41-49%) (HCC)   Hypothyroidism   Essential hypertension   Paroxysmal atrial fibrillation (HCC)   Chronic kidney disease, stage 3b (HCC)   OSA (obstructive sleep apnea)      Hospital Course: 88 y.o. F with hx CHF EF 40%, AS, pAF on ELIquis , CAD, CKD IIIb baseline 1.2, HTN, hypothyroidism, OSA on CPAP and chronic interstitial cystitis who presented with dysuria, malaise, fatigue and subjective fever.  Admitted on antibiotics for acute cystitis.         Acute infectious cystitis superimposed on chronic interstitial cystitis History of ESBL colonization  Patient had 3 recent cultures: 3/25 ESBL Klebsiella 4/21 (at [facility]) with pansensitive Klebsiella 4/28 (this hospitalization) with E faecium  Here she was transitioned to linezolid , discharged to complete 5 days.  Recommend follow-up with infectious disease and urology.    Left rib pain The patient did have some left-sided chest discomfort, worse with laying on her left side, or palpation of the left precordium.  Troponins are negative, ECG unremarkable.  Given provocation on exam, do not suspect this is from AS.   Severe  aortic stenosis Echocardiogram this hospitalization showed progression aortic stenosis, valve area by VTI nation 1.  No regional wall motion abnormalities, stable global hypokinesis with similar EF to previous.  Patient had no symptoms of exertional dyspnea or chest pain with exertion here and has had no syncope. -Follow-up with Dr. Veryl Gottron in 1 week   Paroxysmal atrial fibrillation Chronic systolic congestive heart failure Coronary artery disease without angina Hypertension Here her rates well-controlled, blood pressure was normal, she does appear fluid overloaded.  She is stable on amiodarone , Eliquis , Coreg , hydralazine , and Isordil .   Hypothyroidism Stable on levothyroxine   Chronic kidney disease stage IIIb Creatinine stable within her baseline range            The [place]  Controlled Substances Registry was reviewed for this patient prior to discharge.   Consultants: None   Disposition: Home Diet recommendation:  Cardiac diet  DISCHARGE MEDICATION: Allergies as of 08/19/2023       Reactions   Cephalexin Other (See Comments)   Abdominal pain , GI distress, Abdominal pain , GI distress   Hydrocodone-acetaminophen  Other (See Comments), Rash   Palpitations, flushing, hyperactivity, Chest pain, Chest pain   Ativan [lorazepam] Other (See Comments)   "Knocked her out, woke up 3 days later"   Augmentin  [amoxicillin -pot Clavulanate] Nausea And Vomiting   Avelox [moxifloxacin] Itching, Nausea Only   Brimonidine Itching, Swelling   redness   Chlorzoxazone Other (See Comments)   Went crazy/loopy   Ciprofloxacin Hcl Itching, Nausea Only   Clavulanic Acid Other (See Comments)    Unknown   Crestor [rosuvastatin] Other (See Comments)  myalgia   Ezetimibe Diarrhea   Stomach cramps   Gluten Meal Other (See Comments)   Diarrhea, hot/cold sweat, will sleep for a couple of hours - Celiac disease   Lansoprazole Other (See Comments)   GI intolerance   Nitrofuran  Derivatives Itching, Nausea Only   Phenergan  [promethazine  Hcl] Other (See Comments)   hallucinations   Wheat Other (See Comments)   Gi distress- celiac disease   Codeine Palpitations   Darvon [propoxyphene Hcl] Palpitations   Doxycycline  Itching, Nausea Only        Medication List     PAUSE taking these medications    CYSTEX PO Wait to take this until your doctor or other care provider tells you to start again. Take 1 Capful by mouth daily.       STOP taking these medications    sulfamethoxazole -trimethoprim  800-160 MG tablet Commonly known as: BACTRIM  DS       TAKE these medications    acetaminophen  500 MG tablet Commonly known as: TYLENOL  Take 1 tablet (500 mg total) by mouth every 6 (six) hours as needed for pain. What changed:  how much to take when to take this reasons to take this   amiodarone  200 MG tablet Commonly known as: PACERONE  TAKE 1 TABLET DAILY   Artificial Tears 1.4 % ophthalmic solution Generic drug: polyvinyl alcohol  Place 1 drop into both eyes as needed for dry eyes.   carvedilol  12.5 MG tablet Commonly known as: COREG  Take 1 tablet (12.5 mg total) by mouth 2 (two) times daily.   clidinium-chlordiazePOXIDE  5-2.5 MG capsule Commonly known as: LIBRAX Take 2 capsules by mouth daily as needed (cramping).   cyanocobalamin  500 MCG tablet Commonly known as: VITAMIN B12 Take 1 tablet (500 mcg total) by mouth daily.   diltiazem  360 MG 24 hr capsule Commonly known as: CARDIZEM  CD Take 1 capsule (360 mg total) by mouth daily.   Eliquis  5 MG Tabs tablet Generic drug: apixaban  TAKE 1 TABLET TWICE A DAY   esomeprazole 40 MG capsule Commonly known as: NEXIUM Take 40 mg by mouth daily before breakfast.   fenofibrate  54 MG tablet Take 54 mg by mouth every evening.   folic acid  1 MG tablet Commonly known as: FOLVITE  Take 1 tablet (1 mg total) by mouth daily.   hydrALAZINE  100 MG tablet Commonly known as: APRESOLINE  TAKE 1 TABLET  THREE TIMES A DAY   isosorbide  dinitrate 10 MG tablet Commonly known as: ISORDIL  Take 10 mg by mouth 2 (two) times daily.   latanoprost  0.005 % ophthalmic solution Commonly known as: XALATAN  Place 1 drop into both eyes at bedtime.   levothyroxine  125 MCG tablet Commonly known as: SYNTHROID  Take 125 mcg by mouth daily.   linezolid  600 MG tablet Commonly known as: ZYVOX  Take 1 tablet (600 mg total) by mouth every 12 (twelve) hours.   Motegrity  2 MG Tabs Generic drug: Prucalopride Succinate  TAKE 1 TABLET(2 MG) BY MOUTH DAILY What changed:  how much to take how to take this when to take this additional instructions   ondansetron  4 MG tablet Commonly known as: ZOFRAN  TAKE 1 TABLET EVERY 8 HOURS AS NEEDED FOR NAUSEA OR VOMITING What changed: See the new instructions.   Oscimin  0.125 MG Subl Generic drug: Hyoscyamine  Sulfate SL Take 0.125 mg by mouth as needed (cramping).   pilocarpine  1 % ophthalmic solution Commonly known as: PILOCAR Place 1 drop into the right eye 2 (two) times daily.   polyethylene glycol 17 g packet Commonly known as:  MIRALAX  / GLYCOLAX  Take 17 g by mouth 2 (two) times daily.   Trulance  3 MG Tabs Generic drug: Plecanatide  Take 3 mg by mouth daily.   Vitamin D (Ergocalciferol) 1.25 MG (50000 UNIT) Caps capsule Commonly known as: DRISDOL Take 50,000 Units by mouth every 7 (seven) days.        Follow-up Information     Velazquez, Ardean Beat., MD. Schedule an appointment as soon as possible for a visit in 1 week(s).   Specialty: Internal Medicine Contact information: [address]  [address] [place] [address] [phone]                 Discharge Instructions     Discharge instructions   Complete by: As directed    **IMPORTANT DISCHARGE INSTRUCTIONS**   From Dr. Darlyn Eke: You were admitted for weakness.  Here, we found that your urine may have infection.    We evaluated for signs of heart failure, heart attack, and these were  normal.  Your urine culture was growing Enteroccus faecium, which is susceptible to linezolid   Complete 5 days of Linezolid  twice daily (next dose tonight)  Go see Dr. Veryl Gottron on May 7 for your heart valve  Go see your primary doctor in 1 week and have labs checked   Increase activity slowly   Complete by: As directed        Discharge Exam: Filed Weights   08/18/23 0621 08/19/23 0500  Weight: 81.8 kg 54.7 kg    General: Pt is alert, awake, not in acute distress Cardiovascular: RRR, nl S1-S2, no murmurs appreciated.   No LE edema.   Respiratory: Normal respiratory rate and rhythm.  CTAB without rales or wheezes. Abdominal: Abdomen soft and non-tender.  No distension or HSM.   Neuro/Psych: Strength symmetric in upper and lower extremities.  Judgment and insight appear normal.   Condition at discharge: good  The results of significant diagnostics from this hospitalization (including imaging, microbiology, ancillary and laboratory) are listed below for reference.   Imaging Studies: ECHOCARDIOGRAM COMPLETE Result Date: 08/18/2023    ECHOCARDIOGRAM REPORT   Patient Name:   Amy Hickman Date of Exam: 08/18/2023 Medical Rec #:  [id number]       Height:       63.0 in Accession #:    [id number]      Weight:       180.3 lb Date of Birth:  [birth year]      BSA:          1.850 m Patient Age:    87 years        BP:           157/67 mmHg Patient Gender: F               HR:           57 bpm. Exam Location:  Inpatient Procedure: 2D Echo, Cardiac Doppler and Color Doppler (Both Spectral and Color            Flow Doppler were utilized during procedure). Indications:     Chest pain  History:         Patient has prior history of Echocardiogram examinations, most                  recent 12/24/2021. CHF, CAD, COPD, Arrythmias:Atrial                  Fibrillation, Signs/Symptoms:Chest Pain; Risk Factors:Sleep  Apnea, Hypertension and Dyslipidemia. DVT.  Sonographer:     Juanita Shaw  Referring Phys:  [id number] Elijio [facility] Diagnosing Phys: Pasqual Bone MD IMPRESSIONS  1. Left ventricular ejection fraction, by estimation, is 50 to 55%. The left ventricle has low normal function. The left ventricle demonstrates global hypokinesis. Left ventricular diastolic parameters are consistent with Grade I diastolic dysfunction (impaired relaxation).  2. Right ventricular systolic function is normal. The right ventricular size is normal. There is normal pulmonary artery systolic pressure.  3. Left atrial size was moderately dilated.  4. Right atrial size was mildly dilated.  5. The mitral valve is degenerative. Moderate mitral valve regurgitation. Mild mitral stenosis.  6. Tricuspid valve regurgitation is mild to moderate.  7. The aortic valve is tricuspid. There is moderate calcification of the aortic valve. There is moderate thickening of the aortic valve. Aortic valve regurgitation is mild. Moderate to severe aortic valve stenosis.  8. There is mild (Grade II) atheroma plaque involving the ascending aorta and aortic root.  9. The inferior vena cava is dilated in size with <50% respiratory variability, suggesting right atrial pressure of 15 mmHg. Conclusion(s)/Recommendation(s): Findings consistent with severe valvular heart disease. FINDINGS  Left Ventricle: Left ventricular ejection fraction, by estimation, is 50 to 55%. The left ventricle has low normal function. The left ventricle demonstrates global hypokinesis. The left ventricular internal cavity size was normal in size. There is borderline concentric left ventricular hypertrophy. Left ventricular diastolic parameters are consistent with Grade I diastolic dysfunction (impaired relaxation). Right Ventricle: The right ventricular size is normal. No increase in right ventricular wall thickness. Right ventricular systolic function is normal. There is normal pulmonary artery systolic pressure. The tricuspid regurgitant velocity is 1.66 m/s, and   with an assumed right atrial pressure of 3 mmHg, the estimated right ventricular systolic pressure is 14.0 mmHg. Left Atrium: Left atrial size was moderately dilated. Right Atrium: Right atrial size was mildly dilated. Pericardium: There is no evidence of pericardial effusion. Mitral Valve: The mitral valve is degenerative in appearance. Moderate mitral valve regurgitation. Mild mitral valve stenosis. MV peak gradient, 4.2 mmHg. The mean mitral valve gradient is 1.0 mmHg. Tricuspid Valve: The tricuspid valve is normal in structure. Tricuspid valve regurgitation is mild to moderate. Aortic Valve: The aortic valve is tricuspid. There is moderate calcification of the aortic valve. There is moderate thickening of the aortic valve. There is moderate aortic valve annular calcification. Aortic valve regurgitation is mild. Aortic regurgitation PHT measures 510 msec. Moderate to severe aortic stenosis is present. Aortic valve mean gradient measures 10.8 mmHg. Aortic valve peak gradient measures 20.7 mmHg. Aortic valve area, by VTI measures 0.89 cm. Pulmonic Valve: The pulmonic valve was normal in structure. Pulmonic valve regurgitation is trivial. Aorta: The aortic root is normal in size and structure. There is mild (Grade II) atheroma plaque involving the ascending aorta and aortic root. Venous: The inferior vena cava is dilated in size with less than 50% respiratory variability, suggesting right atrial pressure of 15 mmHg. IAS/Shunts: The atrial septum is grossly normal.  LEFT VENTRICLE PLAX 2D LVIDd:         4.80 cm      Diastology LVIDs:         3.50 cm      LV e' medial:    3.92 cm/s LV PW:         0.90 cm      LV E/e' medial:  23.2 LV IVS:  0.90 cm      LV e' lateral:   7.29 cm/s LVOT diam:     2.10 cm      LV E/e' lateral: 12.5 LV SV:         50 LV SV Index:   27 LVOT Area:     3.46 cm  LV Volumes (MOD) LV vol d, MOD A2C: 74.9 ml LV vol d, MOD A4C: 169.0 ml LV vol s, MOD A2C: 36.4 ml LV vol s, MOD A4C: 81.8 ml  LV SV MOD A2C:     38.5 ml LV SV MOD A4C:     169.0 ml LV SV MOD BP:      58.8 ml RIGHT VENTRICLE             IVC RV Basal diam:  3.50 cm     IVC diam: 0.80 cm RV Mid diam:    2.50 cm RV S prime:     11.70 cm/s TAPSE (M-mode): 2.4 cm LEFT ATRIUM             Index        RIGHT ATRIUM           Index LA diam:        4.20 cm 2.27 cm/m   RA Area:     11.30 cm LA Vol (A2C):   49.9 ml 26.97 ml/m  RA Volume:   20.00 ml  10.81 ml/m LA Vol (A4C):   45.7 ml 24.70 ml/m LA Biplane Vol: 47.8 ml 25.83 ml/m  AORTIC VALVE                     PULMONIC VALVE AV Area (Vmax):    0.87 cm      PV Vmax:       1.14 m/s AV Area (Vmean):   0.85 cm      PV Peak grad:  5.2 mmHg AV Area (VTI):     0.89 cm AV Vmax:           227.40 cm/s AV Vmean:          149.200 cm/s AV VTI:            0.559 m AV Peak Grad:      20.7 mmHg AV Mean Grad:      10.8 mmHg LVOT Vmax:         57.40 cm/s LVOT Vmean:        36.700 cm/s LVOT VTI:          0.144 m LVOT/AV VTI ratio: 0.26 AI PHT:            510 msec  AORTA Ao Root diam: 3.20 cm Ao Asc diam:  2.50 cm MITRAL VALVE               TRICUSPID VALVE MV Area (PHT): 4.04 cm    TR Peak grad:   11.0 mmHg MV Area VTI:   1.75 cm    TR Vmax:        166.00 cm/s MV Peak grad:  4.2 mmHg MV Mean grad:  1.0 mmHg    SHUNTS MV Vmax:       1.03 m/s    Systemic VTI:  0.14 m MV Vmean:      52.9 cm/s   Systemic Diam: 2.10 cm MV Decel Time: 188 msec MV E velocity: 90.80 cm/s MV A velocity: 51.80 cm/s MV E/A ratio:  1.75 Pasqual Bone MD Electronically signed by Pasqual Bone MD  Signature Date/Time: 08/18/2023/1:29:37 PM    Final    DG Chest Port 1 View Result Date: 08/16/2023 CLINICAL DATA:  Questionable sepsis. EXAM: PORTABLE CHEST 1 VIEW COMPARISON:  04/12/2023 FINDINGS: Stable enlarged cardiac silhouette. No effusion, infiltrate, pneumothorax. Bilateral shoulder prosthetics. Surgical clips through the gastric cardiac region. IMPRESSION: 1. No acute cardiopulmonary process. 2. Cardiomegaly. Electronically Signed   By:  Deboraha Fallow M.D.   On: 08/16/2023 17:24    Microbiology: Results for orders placed or performed during the hospital encounter of 08/16/23  Urine Culture     Status: Abnormal   Collection Time: 08/16/23  6:40 PM   Specimen: Urine, Clean Catch  Result Value Ref Range Status   Specimen Description   Final    URINE, CLEAN CATCH Performed at [facility], [address] [place], [place] [address]    Special Requests   Final    NONE Performed at [facility], [address] [place], [place] [address]    Culture >=100,000 COLONIES/mL ENTEROCOCCUS FAECIUM (A)  Final   Report Status 08/18/2023 FINAL  Final   Organism ID, Bacteria ENTEROCOCCUS FAECIUM (A)  Final      Susceptibility   Enterococcus faecium - MIC*    AMPICILLIN >=32 RESISTANT Resistant     NITROFURANTOIN 32 SENSITIVE Sensitive     VANCOMYCIN <=0.5 SENSITIVE Sensitive     * >=100,000 COLONIES/mL ENTEROCOCCUS FAECIUM  Blood Culture (routine x 2)     Status: None (Preliminary result)   Collection Time: 08/16/23  7:20 PM   Specimen: BLOOD  Result Value Ref Range Status   Specimen Description   Final    BLOOD SITE NOT SPECIFIED Performed at [facility], [address] [place], [place] [address]    Special Requests   Final    BOTTLES DRAWN AEROBIC AND ANAEROBIC Blood Culture adequate volume Performed at [facility], [address] [place], [place] [address]    Culture   Final    NO GROWTH 3 DAYS Performed at [facility], [address] [place], [place] [address]    Report Status PENDING  Incomplete  Blood Culture (routine x 2)     Status: None (Preliminary result)   Collection Time: 08/17/23 12:59 AM   Specimen: BLOOD RIGHT ARM  Result Value Ref Range Status   Specimen Description   Final    BLOOD RIGHT ARM Performed at [facility], [address] [place], [place] [address]    Special Requests   Final     BOTTLES DRAWN AEROBIC ONLY Blood Culture results may not be optimal due to an inadequate volume of blood received in culture bottles Performed at [facility], [address] [place], [place] [address]    Culture   Final    NO GROWTH 2 DAYS Performed at [facility], [address] [place], [place] [address]    Report Status PENDING  Incomplete    Labs: CBC: Recent Labs  Lab 08/14/23 2231 08/16/23 1920 08/17/23 0059 08/18/23 0541 08/19/23 0533  WBC 7.5 5.9 7.1 6.4 6.2  NEUTROABS 5.9 4.5  --  4.9  --   HGB 10.4* 11.2* 10.7* 9.8* 9.9*  HCT 32.4* 35.7* 34.3* 30.6* 31.6*  MCV 94.5 97.0 97.2 95.0 97.8  PLT 276 232 227 215 197   Basic Metabolic Panel: Recent Labs  Lab 08/14/23 2231 08/16/23 1920 08/17/23 0059 08/18/23 0541 08/19/23 0533  NA 136 134* 135  133* 131*  K 5.2* 4.2 3.9 3.9 4.0  CL 102 101 103 100 100  CO2 21* 23 20* 24 21*  GLUCOSE 98 93 82 105* 92  BUN 23 21 19 23  25*  CREATININE 1.35* 1.25* 0.88 1.15* 1.05*  CALCIUM  10.3 9.9 9.5 9.4 9.2   Liver Function Tests: Recent Labs  Lab 08/14/23 2231 08/16/23 1920 08/19/23 0533  AST 97* 80* 82*  ALT 38 33 31  ALKPHOS 118 101 93  BILITOT 0.3 0.8 0.4  PROT 6.1* 6.4* 5.5*  ALBUMIN 3.7 3.3* 2.9*   CBG: No results for input(s): "GLUCAP" in the last 168 hours.  Discharge time spent: approximately 45 minutes spent on discharge counseling, evaluation of patient on day of discharge, and coordination of discharge planning with nursing, social work, pharmacy and case management  Signed: Ephriam Hashimoto, MD [facility] 08/19/2023

## 2023-08-19 NOTE — Plan of Care (Signed)

## 2023-08-21 LAB — CULTURE, BLOOD (ROUTINE X 2)
Culture: NO GROWTH
Special Requests: ADEQUATE

## 2023-08-22 LAB — CULTURE, BLOOD (ROUTINE X 2): Culture: NO GROWTH

## 2023-08-23 ENCOUNTER — Other Ambulatory Visit (HOSPITAL_COMMUNITY): Payer: Self-pay

## 2023-08-24 ENCOUNTER — Emergency Department (HOSPITAL_BASED_OUTPATIENT_CLINIC_OR_DEPARTMENT_OTHER)

## 2023-08-24 ENCOUNTER — Encounter (HOSPITAL_BASED_OUTPATIENT_CLINIC_OR_DEPARTMENT_OTHER): Payer: Self-pay

## 2023-08-24 ENCOUNTER — Other Ambulatory Visit: Payer: Self-pay

## 2023-08-24 ENCOUNTER — Emergency Department (HOSPITAL_BASED_OUTPATIENT_CLINIC_OR_DEPARTMENT_OTHER): Admission: EM | Admit: 2023-08-24 | Discharge: 2023-08-25 | Disposition: A | Discharge status: Home / Self Care

## 2023-08-24 DIAGNOSIS — I2510 Atherosclerotic heart disease of native coronary artery without angina pectoris: Secondary | ICD-10-CM | POA: Insufficient documentation

## 2023-08-24 DIAGNOSIS — Z79899 Other long term (current) drug therapy: Secondary | ICD-10-CM | POA: Insufficient documentation

## 2023-08-24 DIAGNOSIS — I130 Hypertensive heart and chronic kidney disease with heart failure and stage 1 through stage 4 chronic kidney disease, or unspecified chronic kidney disease: Secondary | ICD-10-CM | POA: Diagnosis not present

## 2023-08-24 DIAGNOSIS — K5909 Other constipation: Secondary | ICD-10-CM | POA: Insufficient documentation

## 2023-08-24 DIAGNOSIS — N1832 Chronic kidney disease, stage 3b: Secondary | ICD-10-CM | POA: Diagnosis not present

## 2023-08-24 DIAGNOSIS — R1084 Generalized abdominal pain: Secondary | ICD-10-CM | POA: Diagnosis present

## 2023-08-24 DIAGNOSIS — E039 Hypothyroidism, unspecified: Secondary | ICD-10-CM | POA: Insufficient documentation

## 2023-08-24 DIAGNOSIS — I5022 Chronic systolic (congestive) heart failure: Secondary | ICD-10-CM | POA: Diagnosis not present

## 2023-08-24 DIAGNOSIS — Z7901 Long term (current) use of anticoagulants: Secondary | ICD-10-CM | POA: Diagnosis not present

## 2023-08-24 HISTORY — DX: Other constipation: K59.09

## 2023-08-24 LAB — CBC WITH DIFFERENTIAL/PLATELET
Abs Immature Granulocytes: 0.02 10*3/uL (ref 0.00–0.07)
Basophils Absolute: 0 10*3/uL (ref 0.0–0.1)
Basophils Relative: 0 %
Eosinophils Absolute: 0.2 10*3/uL (ref 0.0–0.5)
Eosinophils Relative: 3 %
HCT: 34.3 % — ABNORMAL LOW (ref 36.0–46.0)
Hemoglobin: 11.2 g/dL — ABNORMAL LOW (ref 12.0–15.0)
Immature Granulocytes: 0 %
Lymphocytes Relative: 7 %
Lymphs Abs: 0.5 10*3/uL — ABNORMAL LOW (ref 0.7–4.0)
MCH: 30.6 pg (ref 26.0–34.0)
MCHC: 32.7 g/dL (ref 30.0–36.0)
MCV: 93.7 fL (ref 80.0–100.0)
Monocytes Absolute: 0.4 10*3/uL (ref 0.1–1.0)
Monocytes Relative: 5 %
Neutro Abs: 6.2 10*3/uL (ref 1.7–7.7)
Neutrophils Relative %: 85 %
Platelets: 235 10*3/uL (ref 150–400)
RBC: 3.66 MIL/uL — ABNORMAL LOW (ref 3.87–5.11)
RDW: 16.1 % — ABNORMAL HIGH (ref 11.5–15.5)
WBC: 7.4 10*3/uL (ref 4.0–10.5)
nRBC: 0 % (ref 0.0–0.2)

## 2023-08-24 LAB — URINALYSIS, ROUTINE W REFLEX MICROSCOPIC
Bilirubin Urine: NEGATIVE
Glucose, UA: NEGATIVE mg/dL
Hgb urine dipstick: NEGATIVE
Ketones, ur: NEGATIVE mg/dL
Leukocytes,Ua: NEGATIVE
Nitrite: NEGATIVE
Protein, ur: 100 mg/dL — AB
Specific Gravity, Urine: 1.025 (ref 1.005–1.030)
pH: 6 (ref 5.0–8.0)

## 2023-08-24 LAB — COMPREHENSIVE METABOLIC PANEL WITH GFR
ALT: 43 U/L (ref 0–44)
AST: 119 U/L — ABNORMAL HIGH (ref 15–41)
Albumin: 3.8 g/dL (ref 3.5–5.0)
Alkaline Phosphatase: 156 U/L — ABNORMAL HIGH (ref 38–126)
Anion gap: 11 (ref 5–15)
BUN: 20 mg/dL (ref 8–23)
CO2: 22 mmol/L (ref 22–32)
Calcium: 9.3 mg/dL (ref 8.9–10.3)
Chloride: 99 mmol/L (ref 98–111)
Creatinine, Ser: 1.22 mg/dL — ABNORMAL HIGH (ref 0.44–1.00)
GFR, Estimated: 43 mL/min — ABNORMAL LOW (ref >60)
Glucose, Bld: 100 mg/dL — ABNORMAL HIGH (ref 70–99)
Potassium: 5 mmol/L (ref 3.5–5.1)
Sodium: 131 mmol/L — ABNORMAL LOW (ref 135–145)
Total Bilirubin: 0.4 mg/dL (ref 0.0–1.2)
Total Protein: 6.2 g/dL — ABNORMAL LOW (ref 6.5–8.1)

## 2023-08-24 LAB — URINALYSIS, MICROSCOPIC (REFLEX): WBC, UA: NONE SEEN WBC/hpf (ref 0–5)

## 2023-08-24 MED ORDER — SODIUM CHLORIDE 0.9 % IV BOLUS
1000.0000 mL | Freq: Once | INTRAVENOUS | Status: AC
  Administered 2023-08-24: 1000 mL via INTRAVENOUS

## 2023-08-24 MED ORDER — IOHEXOL 300 MG/ML  SOLN
80.0000 mL | Freq: Once | INTRAMUSCULAR | Status: AC | PRN
  Administered 2023-08-24: 80 mL via INTRAVENOUS

## 2023-08-24 MED ORDER — FLEET ENEMA RE ENEM: 1.0000 | ENEMA | Freq: Once | RECTAL | Status: DC

## 2023-08-24 MED ORDER — SMOG ENEMA: 960.0000 mL | Freq: Once | RECTAL | Status: DC

## 2023-08-24 MED ORDER — BISACODYL 10 MG RE SUPP
10.0000 mg | Freq: Once | RECTAL | Status: AC
  Administered 2023-08-25: 10 mg via RECTAL
  Filled 2023-08-24: qty 1

## 2023-08-24 NOTE — ED Notes (Signed)
 Soap Suds enema done on the Pt. With little return of stool.  Pt. Is on her side on the stretcher with pads underneath her incase she has a stool.

## 2023-08-24 NOTE — ED Notes (Signed)
 Pt. Reports she has not had a BM since last Wed.  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

## 2023-08-24 NOTE — Discharge Instructions (Addendum)
 You were seen for constipation. Your CT scan showed a large stool burden but was negative for obstruction or perforation. You were treated with IV fluids, dulcolax suppository, and soap suds enemas. Through our discussion and shared decision making you decided to wait at home to produce a larger bowel movement rather than being admitted to the hospital. We discussed the risks and benefits. We recommend continued regular follow up with your gastroenterologist and primary care provider.   If you experience ongoing nausea, vomiting, or new sudden abdominal pain please be sure to seek medical assistance as you may be experiencing a medical emergency.

## 2023-08-24 NOTE — ED Provider Notes (Signed)
 [place] [facility] Provider Note   CSN: [id number] Arrival date & time: 08/24/23  1820     History  Chief Complaint  Patient presents with   Constipation   Abdominal Pain    Amy Hickman is a 88 y.o. female.  Patient is a 27 F with a past medical history of pAfib on anticoagulation, chronic HF (EF 41-49%), CAD, CKD stage 3b, HTN, hypothyroidism, OSA, recurrent UTI, and chronic constipation who presents for worsening constipation. She is accompanied by her granddaughter Amy Hickman. Patient's granddaughter was able to supplement patient's HPI as needed as patient is hard of hearing. Patient has had difficulty with bowel movements since last Wednesday when she was hospitalized for UTI. Has experienced bloating, burping, nausea, and abdominal pain since. Denies any fever, vomiting, dysuria, or confusion. Has been passing some gas and even had a small, more liquid stool this morning but it provided no relief. Patient's abdomen continues to feel distended. Patient has been following with GI for many years as a result of the constipation. Has tried numerous prescription medications, more recently her regimen includes Montegrity, Miralax  BID, Dulcolax, magnesium  citrate PRN, and prune juice. Has been seen in ED previously for similar complaint and has had success with enemas in the past.    Constipation Associated symptoms: abdominal pain   Abdominal Pain Associated symptoms: constipation        Home Medications Prior to Admission medications   Medication Sig Start Date End Date Taking? Authorizing Provider  acetaminophen  (TYLENOL ) 500 MG tablet Take 1 tablet (500 mg total) by mouth every 6 (six) hours as needed for pain. Patient taking differently: Take 1,000 mg by mouth as needed for pain or moderate pain (pain score 4-6). 06/24/12   Amy Gandy, MD  amiodarone  (PACERONE ) 200 MG tablet TAKE 1 TABLET DAILY 08/02/23   Amy Donning, MD   carvedilol  (COREG ) 12.5 MG tablet Take 1 tablet (12.5 mg total) by mouth 2 (two) times daily. 12/08/22   Amy Donning, MD  clidinium-chlordiazePOXIDE  (LIBRAX) 5-2.5 MG capsule Take 2 capsules by mouth daily as needed (cramping). 02/28/21   [provider]  cyanocobalamin  (VITAMIN B12) 500 MCG tablet Take 1 tablet (500 mcg total) by mouth daily. 07/10/23   Hickman, Amy A, MD  diltiazem  (CARDIZEM  CD) 360 MG 24 hr capsule Take 1 capsule (360 mg total) by mouth daily. 06/12/21   Amy Donning, MD  ELIQUIS  5 MG TABS tablet TAKE 1 TABLET TWICE A DAY 06/15/23   Amy Donning, MD  esomeprazole (NEXIUM) 40 MG capsule Take 40 mg by mouth daily before breakfast.    [provider]  fenofibrate  54 MG tablet Take 54 mg by mouth every evening. 07/15/22   [provider]  folic acid  (FOLVITE ) 1 MG tablet Take 1 tablet (1 mg total) by mouth daily. 07/10/23   Hickman, Amy A, MD  hydrALAZINE  (APRESOLINE ) 100 MG tablet TAKE 1 TABLET THREE TIMES A DAY 06/25/23   Amy Donning, MD  Hyoscyamine  Sulfate SL (OSCIMIN ) 0.125 MG SUBL Take 0.125 mg by mouth as needed (cramping). 12/05/21   [provider]  isosorbide  dinitrate (ISORDIL ) 10 MG tablet Take 10 mg by mouth 2 (two) times daily. 12/31/21   [provider]  latanoprost  (XALATAN ) 0.005 % ophthalmic solution Place 1 drop into both eyes at bedtime. 08/31/22   [provider]  levothyroxine  (SYNTHROID ) 125 MCG tablet Take 125 mcg by mouth daily. 07/15/23   [provider]  linezolid  (ZYVOX ) 600  MG tablet Take 1 tablet (600 mg total) by mouth every 12 (twelve) hours. 08/19/23   Hickman, Amy Harter, MD  Methenamine-Sodium Salicylate (CYSTEX PO) Take 1 Capful by mouth daily.    [provider]  ondansetron  (ZOFRAN ) 4 MG tablet TAKE 1 TABLET EVERY 8 HOURS AS NEEDED FOR NAUSEA OR VOMITING Patient taking differently: Take 4 mg by mouth every 8 (eight) hours as needed for  nausea or vomiting. 12/07/22   Amy Drum, MD  pilocarpine  (PILOCAR) 1 % ophthalmic solution Place 1 drop into the right eye 2 (two) times daily. 08/31/22   [provider]  polyethylene glycol (MIRALAX  / GLYCOLAX ) 17 g packet Take 17 g by mouth 2 (two) times daily. 07/13/22   Hickman, Amy Harter, MD  polyvinyl alcohol  (ARTIFICIAL TEARS) 1.4 % ophthalmic solution Place 1 drop into both eyes as needed for dry eyes.    [provider]  Prucalopride Succinate  (MOTEGRITY ) 2 MG TABS TAKE 1 TABLET(2 MG) BY MOUTH DAILY Patient taking differently: Take 1 tablet by mouth at bedtime. 04/08/23   Hickman, Amy Bail, MD  TRULANCE  3 MG TABS Take 3 mg by mouth daily. 04/02/23   [provider]  Vitamin D, Ergocalciferol, (DRISDOL) 1.25 MG (50000 UNIT) CAPS capsule Take 50,000 Units by mouth every 7 (seven) days. 05/22/21   [provider]      Allergies    Cephalexin, Hydrocodone-acetaminophen , Ativan [lorazepam], Augmentin  [amoxicillin -pot clavulanate], Avelox [moxifloxacin], Brimonidine, Chlorzoxazone, Ciprofloxacin hcl, Clavulanic acid, Crestor [rosuvastatin], Ezetimibe, Gluten meal, Lansoprazole, Nitrofuran derivatives, Phenergan  [promethazine  hcl], Wheat, Codeine, Darvon [propoxyphene hcl], and Doxycycline     Review of Systems   Review of Systems  Gastrointestinal:  Positive for abdominal pain and constipation.    Physical Exam Updated Vital Signs BP (!) 149/67   Pulse 64   Temp 98 F (36.7 C) (Oral)   Resp 12   SpO2 95%  Physical Exam HENT:     Head: Normocephalic and atraumatic.  Abdominal:     General: Bowel sounds are normal. There is distension.     Tenderness: There is generalized abdominal tenderness. There is no guarding.  Skin:    General: Skin is warm.  Neurological:     Mental Status: She is alert.     Comments: Seems to be at baseline  Psychiatric:        Mood and Affect: Mood normal.     ED Results / Procedures / Treatments   Labs (all  labs ordered are listed, but only abnormal results are displayed) Labs Reviewed  CBC WITH DIFFERENTIAL/PLATELET - Abnormal; Notable for the following components:      Result Value   RBC 3.66 (*)    Hemoglobin 11.2 (*)    HCT 34.3 (*)    RDW 16.1 (*)    Lymphs Abs 0.5 (*)    All other components within normal limits  COMPREHENSIVE METABOLIC PANEL WITH GFR - Abnormal; Notable for the following components:   Sodium 131 (*)    Glucose, Bld 100 (*)    Creatinine, Ser 1.22 (*)    Total Protein 6.2 (*)    AST 119 (*)    Alkaline Phosphatase 156 (*)    GFR, Estimated 43 (*)    All other components within normal limits  URINALYSIS, ROUTINE W REFLEX MICROSCOPIC - Abnormal; Notable for the following components:   Protein, ur 100 (*)    All other components within normal limits  URINALYSIS, MICROSCOPIC (REFLEX) - Abnormal; Notable for the following components:  Bacteria, UA RARE (*)    All other components within normal limits    EKG None  Radiology CT ABDOMEN PELVIS W CONTRAST Result Date: 08/24/2023 CLINICAL DATA:  Abdominal pain, constipation. No bowel movement since last Wednesday. Colonized got infection, abdominal distention and nausea EXAM: CT ABDOMEN AND PELVIS WITH CONTRAST TECHNIQUE: Multidetector CT imaging of the abdomen and pelvis was performed using the standard protocol following bolus administration of intravenous contrast. RADIATION DOSE REDUCTION: This exam was performed according to the departmental dose-optimization program which includes automated exposure control, adjustment of the mA and/or kV according to patient size and/or use of iterative reconstruction technique. CONTRAST:  80mL OMNIPAQUE  IOHEXOL  300 MG/ML  SOLN COMPARISON:  CT abdomen pelvis 07/04/2023 FINDINGS: Lower chest: Trace bilateral pleural effusions.  Cardiomegaly. Hepatobiliary: Similar periportal edema. No focal hepatic lesion. Cholecystectomy. No biliary dilation. Pancreas: No acute abnormality. Spleen:  Unremarkable. Adrenals/Urinary Tract: Normal adrenal glands. No urinary calculi or hydronephrosis. Large amount of gas in the bladder. Stomach/Bowel: Stomach is within normal limits. Bowel anastomosis in the pelvis. Large colonic stool burden. Marked dilation of the transverse colon measuring up to 11.7 cm. This is increased compared to 07/04/2023. Smooth tapering of the colon distal to the dilated segment without discrete transition point. No bowel wall thickening. Vascular/Lymphatic: Aortic atherosclerosis. No enlarged abdominal or pelvic lymph nodes. IVC filter. Reproductive: Hysterectomy.  No adnexal mass. Other: Small volume free fluid in the abdomen and pelvis. No free intraperitoneal air. Musculoskeletal: No acute fracture.  Body wall edema. IMPRESSION: 1. Large colonic stool burden. Marked dilation of the transverse colon measuring up to 11.7 cm. This is increased compared to 07/04/2023. Smooth tapering of the colon distal to the dilated segment without discrete transition point. Findings are favored to represent constipation and ileus. 2. Constellation of findings suggestive of right heart failure/volume overload including marked periportal edema, small volume abdominopelvic ascites, and body wall edema. 3. Large amount of gas in the bladder. Correlate for recent instrumentation. 4. Trace bilateral pleural effusions. 5. Aortic Atherosclerosis (ICD10-I70.0). Electronically Signed   By: Rozell Cornet M.D.   On: 08/24/2023 21:51    Procedures Procedures    Medications Ordered in ED Medications  bisacodyl  (DULCOLAX) suppository 10 mg (has no administration in time range)  sodium chloride  0.9 % bolus 1,000 mL (0 mLs Intravenous Stopped 08/24/23 2255)  iohexol  (OMNIPAQUE ) 300 MG/ML solution 80 mL (80 mLs Intravenous Contrast Given 08/24/23 2122)    ED Course/ Medical Decision Making/ A&P                                 Medical Decision Making Patient has been stable throughout admission. Does not  appear to be in acute distress. Has been oriented and is able to answer questions appropriately. Physical exam is notable for distended abdomen that is diffusely tender to palpation. CT abdomen shows large colonic stool burden, dilation of the transverse colon (11.7 cm) compared to imaging in March. Overall findings consistent for constipation and ileus. Gas was observed in bladder but did have recent instrumentation. UA unremarkable, CBC stable, and BMP with mildly elevated Alk phos likely in the setting of constipation. Initially given a liter of fluids given recent low intake, soap suds enema, and dulcolax suppository with some relief in symptoms. She was able to produce a small amount of stool following first soap suds enema. Reordered soap suds enema given that SMOG enema not available in ED. Discussed possible  admission at [facility] or [facility] for additional management such as fecal disimpaction should patient not produce further stool with relief of symptoms. If able to pass stool with symptom improvement recommended follow up with PCP outpatient. After shared decision making patient decided to wait at home for additional bowel movements. States the small bowel movement she passed after the first suds enema did provide some relief. Discussed risks and benefits of staying in the ED vs going home vs being admitted. Patient prefers waiting at home and coming back to the ED should symptoms worsen or stay the same over the next few days. She did not want to be admitted at this time as she feels some relief and she was recently discharged from the hospital. Would like to try an additional enema at this time, otherwise will go home and resume home regimen for constipation. Return precautions provided.   Amount and/or Complexity of Data Reviewed Labs: ordered. Radiology: ordered.  Risk OTC drugs. Prescription drug management.         Final Clinical Impression(s) / ED Diagnoses Final diagnoses:   Other constipation  Generalized abdominal pain    Rx / DC Orders ED Discharge Orders     None         Arellano Zameza, Quintell Bonnin, MD 08/25/23 0001    Carin Charleston, MD 08/25/23 2239

## 2023-08-24 NOTE — ED Triage Notes (Addendum)
 Grand-Dtg states she has not had a BM in over a week. +nausea and belching Motegrity  and miralax  2x day and stool softners with no results. Today gave her Magnesium  citrate, not able to eat, just sips on water Abdomen is distended Dc'd from hospital last Thursday with UTI

## 2023-08-25 ENCOUNTER — Ambulatory Visit (HOSPITAL_BASED_OUTPATIENT_CLINIC_OR_DEPARTMENT_OTHER): Payer: Medicare Other | Admitting: Cardiology

## 2023-08-25 ENCOUNTER — Encounter (HOSPITAL_BASED_OUTPATIENT_CLINIC_OR_DEPARTMENT_OTHER): Payer: Self-pay | Admitting: Cardiology

## 2023-08-25 VITALS — BP 98/48 | HR 95 | Ht 60.0 in | Wt 133.3 lb

## 2023-08-25 DIAGNOSIS — Z7901 Long term (current) use of anticoagulants: Secondary | ICD-10-CM | POA: Diagnosis not present

## 2023-08-25 DIAGNOSIS — K5909 Other constipation: Secondary | ICD-10-CM | POA: Diagnosis not present

## 2023-08-25 DIAGNOSIS — I480 Paroxysmal atrial fibrillation: Secondary | ICD-10-CM | POA: Diagnosis not present

## 2023-08-25 DIAGNOSIS — I428 Other cardiomyopathies: Secondary | ICD-10-CM

## 2023-08-25 DIAGNOSIS — D6859 Other primary thrombophilia: Secondary | ICD-10-CM | POA: Diagnosis not present

## 2023-08-25 DIAGNOSIS — N1832 Chronic kidney disease, stage 3b: Secondary | ICD-10-CM

## 2023-08-25 MED ORDER — LACTULOSE 10 GM/15ML PO SOLN
10.0000 g | Freq: Once | ORAL | Status: AC
  Administered 2023-08-25: 10 g via ORAL
  Filled 2023-08-25: qty 30

## 2023-08-25 MED ORDER — DILTIAZEM HCL ER COATED BEADS 120 MG PO CP24: ORAL_CAPSULE | ORAL | 0 refills | Status: AC

## 2023-08-25 MED ORDER — LACTULOSE 10 GM/15ML PO SOLN: 10.0000 g | Freq: Two times a day (BID) | ORAL | 0 refills | Status: AC | PRN

## 2023-08-25 NOTE — Patient Instructions (Addendum)
 Medication Instructions:  Your physician has recommended you make the following change in your medication:   We are going to try to go down on the diltiazem  and see if this helps the constipation. However, this also affects heart rate and blood pressure. I'm ok with the heart rate going up slightly, but if your heart rate is consistently >100 bpm at rest, please let me know -decrease to 240 mg (2 pills of 120 mg dose) for one week, then -decrease to 120 mg (1 pill) for one week, then -stop  If blood pressure stays low (less than 110 on the stop), skip the hydralazine  dose you are next due for. If you are skipping most doses of hydralazine , please let us  know.  Follow-Up: Please follow up in 4 to 6 weeks with Slater Duncan, NP or Neomi Banks, NP

## 2023-08-25 NOTE — ED Notes (Signed)
 RN attempted enema- fluid is running right back out clear. There is no blockage of stool. Pt tolerated well. DUcolax given instead.

## 2023-08-25 NOTE — ED Provider Notes (Signed)
 Prior to discharge patient's daughter had questions about her home bowel regimen which already includes 4 caps of Miralax  BID and mag citrate. Will add lactulose , she has GI follow up scheduled. All other questions answered.    Charmayne Cooper, MD 08/25/23 [phone]

## 2023-08-25 NOTE — Progress Notes (Signed)
 Cardiology Office Note:  .    Date:  08/25/2023  ID:  Amy Hickman, DOB [birth year], MRN [id number] PCP: Lieutenant Amy Hickman., MD  [facility] Providers Cardiologist:  Sheryle Donning, MD     History of Present Illness: .    Amy Hickman is a 88 y.o. female with a hx of persistent atrial fibrillation, hypertension, OSA on CPAP, CAD, CKD, GERD, COPD, and celiac disease, who is seen for follow up today. I met her during her admission 03/17/21 for the evaluation and management of atrial fibrillation.   History: Admission 03/15/21 for sepsis in the setting of influenza A, found to have afib RVR. EF slightly reduced. Difficult to rate control, required both metoprolol  and diltiazem . Cardioversion 04/24/2021 got her into sinus rhythm, but she then returned to atrial fibrillation. Started on amiodarone , repeat DCCV 06/12/21 with restoration of sinus rhythm.   Today: Here for post hospital follow up. Hospital course reviewed, full medication reconciliation done today.  Was in ER yesterday for constipation. Given enemas, mag citrate, lactulose . Discharged from the hospital on 5/1 for UTI. Sees Dr.Pyrtle. I reviewed her medications. She has been on diltiazem  for a long time, but we discussed the potential   Reviewed her recent echo. EF 50-55%. Read as moderate to severe AS. Mean gradient 10.8, but she is low flow by stroke volume. DI 0.26, AVA 0.89. We discussed TAVR in general today.  No bleeding issues. Remains on apixaban  5 mg BID, just on border for dose reduction.   ROS:  generally tired, bloated/distended from constipation. No syncope, no chest pain, no LE edema.  Studies Reviewed: Aaron Aas          Physical Exam:    VS:  BP (!) 98/48 (BP Location: Right Arm, Patient Position: Sitting, Cuff Size: Normal)   Pulse 95   Ht 5' (1.524 m)   Wt 133 lb 4.8 oz (60.5 kg)   SpO2 95%   BMI 26.03 kg/m    Wt Readings from Last 3 Encounters:  08/25/23 133 lb 4.8 oz (60.5 kg)   08/19/23 120 lb 9.5 oz (54.7 kg)  08/14/23 130 lb 4.7 oz (59.1 kg)    GEN: Well nourished, well developed in no acute distress HEENT: Normal, moist mucous membranes NECK: No JVD CARDIAC: regular rhythm, normal S1 and S2, no rubs or gallops. 2/6 systolic murmur and 1/4 diastolic murmur. VASCULAR: Radial and DP pulses 2+ bilaterally. No carotid bruits RESPIRATORY:  Clear to auscultation without rales, wheezing or rhonchi  ABDOMEN: Soft, non-tender, non-distended MUSCULOSKELETAL:  Ambulates independently SKIN: Warm and dry, no edema NEUROLOGIC:  Alert and oriented x 3. No focal neuro deficits noted. PSYCHIATRIC:  Normal affect   ASSESSMENT AND PLAN: .    Recent hospitalization Constipation, UTI -discharged within the last seven days, seen for transition of care/post hospital discharge follow up. Reviewed her echo she received while admitted, see below. -in ER yesterday for constipation, which has been a chronic issue for her. No new meds. We did discuss that in some cases, diltiazem  can be associated with constipation. This has been a chronic medication for her, but given severity of her constipation, is worth trialing off of this to see if symptoms improve. Did discuss that this may result in tachycardia, they will monitor.  Chronic kidney disease, stage 3b vs stage 4 Hypertension -continue hydralazine  100 mg TID, carvedilol , imdur -weaning diltiazem  as below -clonidine would be last step   Paroxysmal atrial fibrillation/atrial flutter Chronic systolic and diastolic heart failure  Nonischemic cardiomyopathy -EF 40-45%. Not significantly changed with holding sinus rhythm -Cath with nonobstructive CAD -tolerating carvedilol  -as above, will wean diltiazem  to see if this improves constipation, watch for tachycardia -CHA2DS2/VAS Stroke Risk Points=8  -continue apixaban  5 mg BID; Cr right at border for dose reduction, meets age criteria but not weight. If Cr is >1.5 or weight  consistently <60 kg, would dose reduce to 2.5 mg BID -had long discussion re: amiodarone  with thyroid  history. She is followed by endocrinology. Thyroid  has been stable, she had remained in sinus rhythm, so continue amiodarone .   OSA: continue CPAP   Moderate-severe aortic stenosis Mild-moderate MR -discussed today. Reviewed monitoring, symptoms to watch for. Discussed TAVR procedure if stenosis becomes severe. They are not sure they would want to pursue TAVR if stenosis progresses; they will discuss as a family, but we are not yet at a point where we need to make this decision. -recheck echo 6 mos or sooner based on symptoms   CAD, nonobstructive, without angina -given age, continue apixaban  and no aspirin  -continue atorvastatin   Dispo: Follow-up in 4-6 weeks  Signed, Sheryle Donning, MD

## 2023-08-25 NOTE — ED Notes (Addendum)
 Granddaughter called and notified pt ready for discharge.

## 2023-08-25 NOTE — ED Notes (Signed)
 Family at bedside came and notified this tech of questions they had regarding discharge and previous shift provider. This tech notified RN of family request to speak with someone. This tech also notified family that RN was currently with pt and will be there momentarily

## 2023-08-31 ENCOUNTER — Other Ambulatory Visit (INDEPENDENT_AMBULATORY_CARE_PROVIDER_SITE_OTHER)

## 2023-08-31 ENCOUNTER — Ambulatory Visit (INDEPENDENT_AMBULATORY_CARE_PROVIDER_SITE_OTHER): Admitting: Gastroenterology

## 2023-08-31 ENCOUNTER — Encounter: Payer: Self-pay | Admitting: Gastroenterology

## 2023-08-31 ENCOUNTER — Telehealth: Payer: Self-pay

## 2023-08-31 ENCOUNTER — Other Ambulatory Visit (HOSPITAL_COMMUNITY): Payer: Self-pay

## 2023-08-31 VITALS — BP 122/62 | HR 67 | Ht 60.0 in | Wt 133.0 lb

## 2023-08-31 DIAGNOSIS — D649 Anemia, unspecified: Secondary | ICD-10-CM

## 2023-08-31 DIAGNOSIS — I480 Paroxysmal atrial fibrillation: Secondary | ICD-10-CM

## 2023-08-31 DIAGNOSIS — I4891 Unspecified atrial fibrillation: Secondary | ICD-10-CM

## 2023-08-31 DIAGNOSIS — Z87891 Personal history of nicotine dependence: Secondary | ICD-10-CM

## 2023-08-31 DIAGNOSIS — K5909 Other constipation: Secondary | ICD-10-CM | POA: Diagnosis not present

## 2023-08-31 DIAGNOSIS — Z7901 Long term (current) use of anticoagulants: Secondary | ICD-10-CM

## 2023-08-31 DIAGNOSIS — I2510 Atherosclerotic heart disease of native coronary artery without angina pectoris: Secondary | ICD-10-CM

## 2023-08-31 DIAGNOSIS — I824Y9 Acute embolism and thrombosis of unspecified deep veins of unspecified proximal lower extremity: Secondary | ICD-10-CM

## 2023-08-31 DIAGNOSIS — I509 Heart failure, unspecified: Secondary | ICD-10-CM | POA: Diagnosis not present

## 2023-08-31 LAB — COMPREHENSIVE METABOLIC PANEL WITH GFR
ALT: 28 U/L (ref 0–35)
AST: 81 U/L — ABNORMAL HIGH (ref 0–37)
Albumin: 3.6 g/dL (ref 3.5–5.2)
Alkaline Phosphatase: 130 U/L — ABNORMAL HIGH (ref 39–117)
BUN: 15 mg/dL (ref 6–23)
CO2: 26 meq/L (ref 19–32)
Calcium: 9.3 mg/dL (ref 8.4–10.5)
Chloride: 101 meq/L (ref 96–112)
Creatinine, Ser: 1.23 mg/dL — ABNORMAL HIGH (ref 0.40–1.20)
GFR: 40.1 mL/min — ABNORMAL LOW (ref >60.00)
Glucose, Bld: 97 mg/dL (ref 70–99)
Potassium: 4.3 meq/L (ref 3.5–5.1)
Sodium: 136 meq/L (ref 135–145)
Total Bilirubin: 0.5 mg/dL (ref 0.2–1.2)
Total Protein: 5.5 g/dL — ABNORMAL LOW (ref 6.0–8.3)

## 2023-08-31 LAB — CBC WITH DIFFERENTIAL/PLATELET
Basophils Absolute: 0 10*3/uL (ref 0.0–0.1)
Basophils Relative: 0.6 % (ref 0.0–3.0)
Eosinophils Absolute: 0.2 10*3/uL (ref 0.0–0.7)
Eosinophils Relative: 2.1 % (ref 0.0–5.0)
HCT: 31.3 % — ABNORMAL LOW (ref 36.0–46.0)
Hemoglobin: 10.3 g/dL — ABNORMAL LOW (ref 12.0–15.0)
Lymphocytes Relative: 7.4 % — ABNORMAL LOW (ref 12.0–46.0)
Lymphs Abs: 0.6 10*3/uL — ABNORMAL LOW (ref 0.7–4.0)
MCHC: 32.9 g/dL (ref 30.0–36.0)
MCV: 93.4 fl (ref 78.0–100.0)
Monocytes Absolute: 0.9 10*3/uL (ref 0.1–1.0)
Monocytes Relative: 11 % (ref 3.0–12.0)
Neutro Abs: 6.2 10*3/uL (ref 1.4–7.7)
Neutrophils Relative %: 78.9 % — ABNORMAL HIGH (ref 43.0–77.0)
Platelets: 190 10*3/uL (ref 150.0–400.0)
RBC: 3.35 Mil/uL — ABNORMAL LOW (ref 3.87–5.11)
RDW: 16.5 % — ABNORMAL HIGH (ref 11.5–15.5)
WBC: 7.8 10*3/uL (ref 4.0–10.5)

## 2023-08-31 LAB — IBC + FERRITIN
Ferritin: 117.3 ng/mL (ref 10.0–291.0)
Iron: 35 ug/dL — ABNORMAL LOW (ref 42–145)
Saturation Ratios: 11.6 % — ABNORMAL LOW (ref 20.0–50.0)
TIBC: 301 ug/dL (ref 250.0–450.0)
Transferrin: 215 mg/dL (ref 212.0–360.0)

## 2023-08-31 LAB — VITAMIN B12: Vitamin B-12: 602 pg/mL (ref 211–911)

## 2023-08-31 LAB — FOLATE: Folate: 10.5 ng/mL (ref >5.9)

## 2023-08-31 LAB — TSH: TSH: 19.62 u[IU]/mL — ABNORMAL HIGH (ref 0.35–5.50)

## 2023-08-31 MED ORDER — LINACLOTIDE 290 MCG PO CAPS: 290.0000 ug | ORAL_CAPSULE | Freq: Every day | ORAL | 3 refills | Status: AC

## 2023-08-31 NOTE — Telephone Encounter (Signed)
 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

## 2023-08-31 NOTE — Patient Instructions (Addendum)
 Your provider has requested that you go to the basement level for lab work before leaving today. Press "B" on the elevator. The lab is located at the first door on the left as you exit the elevator.  We have sent the following medications to your pharmacy for you to pick up at your convenience:  Linzess  Please follow up with Dr. Bridgett Camps in 3 months

## 2023-08-31 NOTE — Telephone Encounter (Signed)
 There is contradictory information in current chart note stating that patient has tried the Linzess previously with no success. Please provide clinical reasoning for retrying

## 2023-08-31 NOTE — Progress Notes (Signed)
 Chief Complaint: follow up of constipation Primary GI MD: Dr. Bridgett Camps  HPI: 88 year old female with a history of celiac disease, around a year of constipation and obstipation, prior abdominal surgeries including repair of intussusception as a child, appendectomy, hysterectomy, lysis of adhesions, history of CHF, CAD, A-fib on Eliquis , hyperparathyroidism, hypertension and ICD who is here for follow-up   Last seen 12/2022 by Dr. Bridgett Camps for ongoing constipation/obstipation.  No obstructive process seen on cross-sectional imaging 06/2022.  Linzess, Amitiza, Trulance  incompletely effective.  Patient was given samples of Motegrity  2 Mg daily and recommended to continue MiraLAX  twice daily.  CTAP wo contrast 01/2023: Mild gastric wall thickening.  Moderate colonic stool burden.  3 mm left renal calculus. CT abdomen pelvis with contrast 06/23/2023 for abdominal pain: Very large stool burden without evidence of obstruction. CT abdomen pelvis with contrast 07/04/2023: Continued generalized stool retention and colonic distention.  Prominent volume of bladder BX. CTAP w contrast 08/2023 with large stool burden and dilation of transverse colon measuring 11.7cm and smooth tapering of colon distal to dilated segment without transition point.  TSH 05/27/2023 9.991.    Discussed the use of AI scribe software for clinical note transcription with the patient, who gave verbal consent to proceed.  History of Present Illness She has a long-standing history of chronic constipation, resistant to various treatments. She has previously tried Linzess, Amitiza, and Trulance  without success. Currently, she is on Motegrity  2 mg daily, but continues to experience significant constipation. She supplements this with four capfuls of Miralax  both morning and night, yet experiences no improvement. Bowel movements occur infrequently, about twice a month, and are explosive in nature. Significant abdominal distension and weight gain of  about ten pounds from stool retention are noted.   She also has a history of heart issues and is currently being weaned off Cardizem  by her cardiologist to assess if it contributes to her constipation.  Additionally, she has a history of hypothyroidism, with her thyroid  levels last checked in February showing a TSH of nine, indicating significant hypothyroidism. Her thyroid  medication was adjusted from 100 mcg to 125 mcg. She is scheduled to see her endocrinologist in August.  She experiences difficulty urinating and frequent burping, which she associates with her constipation. Her condition has impacted her daily life significantly, including the cancellation of a planned trip to [place] due to health concerns.   PREVIOUS GI HISTORY  colonoscopy and EGD January 2021- The EGD was normal. The colonoscopy showed a 2 mm adenoma and internal hemorrhoids.  Duodenal and stomach biopsies were negative.   2011 egd SB path w/o changes of celiac dz.   Past Medical History:  Diagnosis Date   Atrial fibrillation, chronic (HCC)    s/p multiple cardioversions with sustained NSR, on Eliquis    Celiac disease    Chronic constipation    Chronic systolic CHF (congestive heart failure) (HCC)    Coronary artery disease involving native coronary artery of native heart without angina pectoris 03/15/2021   GERD (gastroesophageal reflux disease)    HOH (hard of hearing)    Hyperparathyroidism (HCC) 12/12/2020   Hypertension    Interstitial cystitis    Recurrent UTI 08/09/2023    Past Surgical History:  Procedure Laterality Date   ABDOMINAL HYSTERECTOMY     APPENDECTOMY     CARDIOVERSION N/A 04/24/2021   Procedure: CARDIOVERSION;  Surgeon: Hugh Madura, MD;  Location: [facility] ENDOSCOPY;  Service: Cardiovascular;  Laterality: N/A;   CARDIOVERSION N/A 06/12/2021   Procedure: CARDIOVERSION;  Surgeon:  Sheryle Donning, MD;  Location: [facility] ENDOSCOPY;  Service: Cardiovascular;  Laterality: N/A;   CARDIOVERSION  N/A 08/11/2021   Procedure: CARDIOVERSION;  Surgeon: Sonny Dust, MD;  Location: [facility] ENDOSCOPY;  Service: Cardiovascular;  Laterality: N/A;   CHOLECYSTECTOMY     ear drum surgery     REPLACEMENT TOTAL KNEE     ROTATOR CUFF REPAIR Right    TONSILLECTOMY      Current Outpatient Medications  Medication Sig Dispense Refill   acetaminophen  (TYLENOL ) 500 MG tablet Take 1 tablet (500 mg total) by mouth every 6 (six) hours as needed for pain. (Patient taking differently: Take 1,000 mg by mouth as needed for pain or moderate pain (pain score 4-6).) 30 tablet 0   amiodarone  (PACERONE ) 200 MG tablet TAKE 1 TABLET DAILY 90 tablet 3   carvedilol  (COREG ) 12.5 MG tablet Take 1 tablet (12.5 mg total) by mouth 2 (two) times daily. 180 tablet 3   clidinium-chlordiazePOXIDE  (LIBRAX) 5-2.5 MG capsule Take 2 capsules by mouth daily as needed (cramping).     diltiazem  (CARDIZEM  CD) 120 MG 24 hr capsule Take 2 capsules (240 mg total) by mouth daily for 7 days, THEN 1 capsule (120 mg total) daily for 7 days. 21 capsule 0   ELIQUIS  5 MG TABS tablet TAKE 1 TABLET TWICE A DAY 180 tablet 3   esomeprazole (NEXIUM) 40 MG capsule Take 40 mg by mouth daily before breakfast.     fenofibrate  54 MG tablet Take 54 mg by mouth every evening.     hydrALAZINE  (APRESOLINE ) 100 MG tablet TAKE 1 TABLET THREE TIMES A DAY 270 tablet 3   Hyoscyamine  Sulfate SL (OSCIMIN ) 0.125 MG SUBL Take 0.125 mg by mouth as needed (cramping).     isosorbide  dinitrate (ISORDIL ) 10 MG tablet Take 10 mg by mouth 2 (two) times daily.     lactulose  (CHRONULAC ) 10 GM/15ML solution Take 15 mLs (10 g total) by mouth 2 (two) times daily as needed for up to 10 days for severe constipation. 300 mL 0   latanoprost  (XALATAN ) 0.005 % ophthalmic solution Place 1 drop into both eyes at bedtime.     levothyroxine  (SYNTHROID ) 125 MCG tablet Take 125 mcg by mouth daily.     linaclotide (LINZESS) 290 MCG CAPS capsule Take 1 capsule (290 mcg total) by mouth daily  before breakfast. 30 capsule 3   linaclotide (LINZESS) 290 MCG CAPS capsule Take 1 capsule (290 mcg total) by mouth daily before breakfast. 30 capsule 3   [Paused] Methenamine-Sodium Salicylate (CYSTEX PO) Take 1 Capful by mouth daily.     ondansetron  (ZOFRAN ) 4 MG tablet TAKE 1 TABLET EVERY 8 HOURS AS NEEDED FOR NAUSEA OR VOMITING (Patient taking differently: Take 4 mg by mouth every 8 (eight) hours as needed for nausea or vomiting.) 20 tablet 51   pilocarpine  (PILOCAR) 1 % ophthalmic solution Place 1 drop into the right eye 2 (two) times daily.     polyethylene glycol (MIRALAX  / GLYCOLAX ) 17 g packet Take 17 g by mouth 2 (two) times daily. 14 each 0   polyvinyl alcohol  (ARTIFICIAL TEARS) 1.4 % ophthalmic solution Place 1 drop into both eyes as needed for dry eyes.     Prucalopride Succinate  (MOTEGRITY ) 2 MG TABS TAKE 1 TABLET(2 MG) BY MOUTH DAILY (Patient taking differently: Take 1 tablet by mouth at bedtime.) 90 tablet 3   Vitamin D, Ergocalciferol, (DRISDOL) 1.25 MG (50000 UNIT) CAPS capsule Take 50,000 Units by mouth every 7 (seven) days.  No current facility-administered medications for this visit.    Allergies as of 08/31/2023 - Review Complete 08/31/2023  Allergen Reaction Noted   Cephalexin Other (See Comments) 07/31/2016   Hydrocodone-acetaminophen  Other (See Comments) and Rash 10/01/2011   Ativan [lorazepam] Other (See Comments) 05/01/2012   Augmentin  [amoxicillin -pot clavulanate] Nausea And Vomiting 03/15/2021   Avelox [moxifloxacin] Itching and Nausea Only 03/15/2021   Brimonidine Itching and Swelling 12/31/2021   Chlorzoxazone Other (See Comments) 03/15/2021   Ciprofloxacin hcl Itching and Nausea Only 05/01/2012   Clavulanic acid Other (See Comments) 12/31/2021   Crestor [rosuvastatin] Other (See Comments) 03/15/2021   Ezetimibe Diarrhea 12/31/2021   Gluten meal Other (See Comments) 05/01/2012   Lansoprazole Other (See Comments) 04/27/2011   Nitrofuran derivatives Itching  and Nausea Only 12/23/2014   Phenergan  [promethazine  hcl] Other (See Comments) 05/01/2012   Wheat Other (See Comments) 12/31/2021   Codeine Palpitations 05/01/2012   Darvon [propoxyphene hcl] Palpitations 05/01/2012   Doxycycline  Itching and Nausea Only 07/29/2016    Family History  Problem Relation Age of Onset   Liver cancer Sister    Bone cancer Sister    Bladder Cancer Sister    Brain cancer Sister    Lung cancer Sister    Heart disease Sister    Hypertension Sister    Pancreatic cancer Brother    Liver cancer Brother    Lung cancer Brother    Hypertension Other    Colon cancer Neg Hx    Colon polyps Neg Hx    Esophageal cancer Neg Hx    Rectal cancer Neg Hx    Stomach cancer Neg Hx     Social History   Socioeconomic History   Marital status: Widowed    Spouse name: Not on file   Number of children: 5   Years of education: Not on file   Highest education level: Not on file  Occupational History   Occupation: retired  Tobacco Use   Smoking status: Former    Current packs/day: 0.00    Average packs/day: 2.0 packs/day for 33.0 years (66.0 ttl pk-yrs)    Types: Cigarettes    Start date: 38    Quit date: 44    Years since quitting: 44.3   Smokeless tobacco: Never  Vaping Use   Vaping status: Never Used  Substance and Sexual Activity   Alcohol  use: Not Currently    Comment: rare   Drug use: No   Sexual activity: Not on file  Other Topics Concern   Not on file  Social History Narrative   Not on file   Social Drivers of Health   Financial Resource Strain: Low Risk  (05/26/2021)   Received from [facility], [facility]   Overall Financial Resource Strain (CARDIA)    Difficulty of Paying Living Expenses: Not hard at all  Food Insecurity: No Food Insecurity (08/17/2023)   Hunger Vital Sign    Worried About Running Out of Food in the Last Year: Never true    Ran Out of Food in the Last Year: Never true  Transportation Needs: No Transportation Needs  (08/17/2023)   PRAPARE - Administrator, Civil Service (Medical): No    Lack of Transportation (Non-Medical): No  Physical Activity: Unknown (05/26/2021)   [facility]   Exercise Vital Sign    Days of Exercise per Week: 0 days    Minutes of Exercise per Session: Not on file  Stress: No Stress Concern Present (05/26/2021)  Received from [facility], [facility]   [employer] of Occupational Health - Occupational Stress Questionnaire    Feeling of Stress : Not at all  Social Connections: Moderately Isolated (08/17/2023)   Social Connection and Isolation Panel [NHANES]    Frequency of Communication with Friends and Family: More than three times a week    Frequency of Social Gatherings with Friends and Family: More than three times a week    Attends Religious Services: More than 4 times per year    Active Member of [employer] or Organizations: No    Attends Banker Meetings: Never    Marital Status: Widowed  Intimate Partner Violence: Not At Risk (08/17/2023)   Humiliation, Afraid, Rape, and Kick questionnaire    Fear of Current or Ex-Partner: No    Emotionally Abused: No    Physically Abused: No    Sexually Abused: No    Review of Systems:    Constitutional: No weight loss, fever, chills, weakness or fatigue HEENT: Eyes: No change in vision               Ears, Nose, Throat:  No change in hearing or congestion Skin: No rash or itching Cardiovascular: No chest pain, chest pressure or palpitations   Respiratory: No SOB or cough Gastrointestinal: See HPI and otherwise negative Genitourinary: No dysuria or change in urinary frequency Neurological: No headache, dizziness or syncope Musculoskeletal: No new muscle or joint pain Hematologic: No bleeding or bruising Psychiatric: No history of depression or anxiety    Physical Exam:  Vital signs: BP 122/62   Pulse 67   Ht 5' (1.524 m)   Wt 133 lb (60.3 kg)   BMI 25.97 kg/m    Constitutional: NAD, alert and cooperative. Appears younger than stated age. Walks with 4 roller walker. Head:  Normocephalic and atraumatic. Eyes:   PEERL, EOMI. No icterus. Conjunctiva pink. Respiratory: Respirations even and unlabored. Lungs clear to auscultation bilaterally.   No wheezes, crackles, or rhonchi.  Cardiovascular:  Regular rate and rhythm. No peripheral edema, cyanosis or pallor.  Gastrointestinal:  soft, distended, mild generalized tenderness, normoactive bowel sounds. Rectal:  Declines Msk:  Symmetrical without gross deformities. Without edema, no deformity or joint abnormality.  Neurologic:  Alert and  oriented x4;  grossly normal neurologically.  Skin:   Dry and intact without significant lesions or rashes. Psychiatric: Oriented to person, place and time. Demonstrates good judgement and reason without abnormal affect or behaviors.   RELEVANT LABS AND IMAGING: CBC    Component Value Date/Time   WBC 7.4 08/24/2023 1852   RBC 3.66 (L) 08/24/2023 1852   HGB 11.2 (L) 08/24/2023 1852   HGB 12.4 05/30/2021 1526   HCT 34.3 (L) 08/24/2023 1852   HCT 38.7 05/30/2021 1526   PLT 235 08/24/2023 1852   PLT 261 05/30/2021 1526   MCV 93.7 08/24/2023 1852   MCV 84 05/30/2021 1526   MCH 30.6 08/24/2023 1852   MCHC 32.7 08/24/2023 1852   RDW 16.1 (H) 08/24/2023 1852   RDW 16.4 (H) 05/30/2021 1526   LYMPHSABS 0.5 (L) 08/24/2023 1852   MONOABS 0.4 08/24/2023 1852   EOSABS 0.2 08/24/2023 1852   BASOSABS 0.0 08/24/2023 1852    CMP     Component Value Date/Time   NA 131 (L) 08/24/2023 1852   NA 140 05/30/2021 1526   K 5.0 08/24/2023 1852   CL 99 08/24/2023 1852   CO2 22 08/24/2023 1852   GLUCOSE 100 (H) 08/24/2023 1852  BUN 20 08/24/2023 1852   BUN 34 (H) 05/30/2021 1526   CREATININE 1.22 (H) 08/24/2023 1852   CALCIUM  9.3 08/24/2023 1852   PROT 6.2 (L) 08/24/2023 1852   ALBUMIN 3.8 08/24/2023 1852   AST 119 (H) 08/24/2023 1852   ALT 43 08/24/2023 1852   ALKPHOS  156 (H) 08/24/2023 1852   BILITOT 0.4 08/24/2023 1852   GFRNONAA 43 (L) 08/24/2023 1852   GFRAA 54 (L) 01/22/2019 1405     Assessment/Plan:   88 year old female with a history of celiac disease, around a year of constipation and obstipation, prior abdominal surgeries including repair of intussusception as a child, appendectomy, hysterectomy, lysis of adhesions, history of CHF, CAD, A-fib on Eliquis , hyperparathyroidism, hypertension and ICD who is here for follow-up   Chronic constipation History of abdominal adhesive disease, celiac disease, colonoscopy 3 years ago without significant pathology.  Multiple CT scans over the last years showing prominent stool burden.  No other significant findings.  Linzess, Amitiza, Trulance  incompletely effective.  Improved with Motegrity  2 Mg daily.  Recent worsening constipation likely secondary to exacerbation of thyroid  disease with recent TSH 9.991 February 2025 with no recheck. - Recheck TSH today - Add on Linzess 290 mcg in addition to Motegrity  (samples provided) - Continue to increase water, increase fiber, increase exercise - Patient will call us  in 2 weeks with an update on her symptoms - Follow-up 6 to 8 weeks   Normocytic anemia Daughter expresses concerned about patient's continued anemia.  Appears her baseline hemoglobin is 10-11.  She does have CKD likely contributing and has a previous history of vitamin B12 deficiency.  No overt bleeding. - CBC, CMP, B12/folate, IBC/ferritin  CHF Afib EF 40-45%, on apixaban   Moderate-severe aortic stenosis  CAD With heart history as above patient would be high risk to undergo repeat procedure in addition to her age.  She had a reassuring colonoscopy in 2021.  Will hold off on procedures as source outweigh benefits at this time  This visit required 38 minutes of patient care (this includes precharting, chart review, review of results, face-to-face time used for counseling as well as treatment plan  and follow-up. The patient was provided an opportunity to ask questions and all were answered. The patient agreed with the plan and demonstrated an understanding of the instructions.   Gigi Kyle [facility] 08/31/2023, 9:40 AM  Cc: Lieutenant Reese.,*

## 2023-08-31 NOTE — Telephone Encounter (Signed)
 Pharmacy Patient Advocate Encounter   Received notification from Patient Pharmacy that prior authorization for Linzess 290MCG capsules is required/requested.   Insurance verification completed.   The patient is insured through [employer] .   Per test claim: Please clarify patient diagnosis as chronic constipation (K59.09) is not an FDA-approved diagnosis for this medication

## 2023-08-31 NOTE — Telephone Encounter (Signed)
 Amy Hickman- Can you please clarify? Does patient have IBS-C or CIC?

## 2023-09-01 ENCOUNTER — Ambulatory Visit: Payer: Self-pay

## 2023-09-01 NOTE — Telephone Encounter (Signed)
 Pharmacy Patient Advocate Encounter   Received notification from Patient Pharmacy that prior authorization for Linzess 290MCG capsules is required/requested.   Insurance verification completed.   The patient is insured through [employer] .   Prior Authorization for Linzess 290MCG capsules has been APPROVED from 08-02-2023 to 08-31-2024   PA #/Case ID/Reference #: [id number]

## 2023-09-01 NOTE — Telephone Encounter (Signed)
===  View-only below this line=== ----- Message ----- From: Silver Dross Sent: 09/01/2023   9:54 AM EDT To: Glennette Lanius, RN  Yes! (More importantly she needs her thyroid  fixed as this is the source of her constipation but to control her constipation while she is getting worked up by PCP/endocrine we plan on using BOTH linzess and motegrity  to help her bowels). ----- Message ----- From: Glennette Lanius, RN Sent: 09/01/2023   9:40 AM EDT To: Garr Kalata, PA-C  To further clarify, she will be using BOTH Linzess and Motegrity  for her constipation, correct? ----- Message ----- From: Silver Dross Sent: 09/01/2023   8:08 AM EDT To: Glennette Lanius, RN  She has tried linzess alone with no success. We are adding it on to motegrity  since they have different MOAs to help with her constipation.

## 2023-09-01 NOTE — Telephone Encounter (Signed)
 Lm on vm

## 2023-09-01 NOTE — Telephone Encounter (Signed)
-----   Message from Garr Kalata sent at 08/31/2023 12:23 PM EDT ----- Regarding: FW: Please let patient and daughter know her TSH has gone from 9 in Feb to now 19. She needs to get with PCP or endocrinologist to get this controlled or she will continue to struggle with severe constipation. Otherwise her labs remain stable. Slight iron deficiency that we will monitor at follow up as an iron supplement will make her constipatjon worse. B12 is normal. ----- Message ----- From: Interface, Lab In Three Zero One Sent: 08/31/2023  11:58 AM EDT To: Garr Kalata, PA-C

## 2023-09-02 NOTE — Telephone Encounter (Signed)
-----   Message from Garr Kalata sent at 08/31/2023 12:23 PM EDT ----- Regarding: FW: Please let patient and daughter know her TSH has gone from 9 in Feb to now 19. She needs to get with PCP or endocrinologist to get this controlled or she will continue to struggle with severe constipation. Otherwise her labs remain stable. Slight iron deficiency that we will monitor at follow up as an iron supplement will make her constipatjon worse. B12 is normal. ----- Message ----- From: Interface, Lab In Three Zero One Sent: 08/31/2023  11:58 AM EDT To: Garr Kalata, PA-C

## 2023-09-05 ENCOUNTER — Telehealth: Payer: Self-pay | Admitting: Infectious Diseases

## 2023-09-05 NOTE — Telephone Encounter (Signed)
 Son called on behalf of patient  Got a call from patient Tired, lethargic, when she stands up she has fecal incontinence Appetite poor No fever or chills Some cough No phlegm They called because she is a patient of Dr.Van dam and she thought she could have UTI Temp 97, Bp 140 She has some burning urination but that has been intermittent and there is a question of interstital cystitis Explained to her and son to stay well hydrated. To contact  her  PCP tomorrow to discuss her symptoms  and address whether any of her meds was causing that. also tiredness which I did not think was due to UTI. ( cardizem , amiodarone , eliquis , hydralazine ,prucalopride,nexium, carvedilol ) Told them that post menopausal genitourinary syndrome can be a contributing factor Some tips to prevent colonization of stool bacteria in the vagina was discussed To report to ED if any symptoms like dizziness, chest pain sob Will send this message to her PCP and Dr.Van Dam

## 2023-09-05 NOTE — Telephone Encounter (Signed)
 Got a call from patient Tired, lethargic, when she stands up she has fecal incontinence Appetite poor No fever or chills Some cough No phlegm They called because she is a patient

## 2023-09-09 NOTE — Progress Notes (Signed)
 Addendum: Reviewed and agree with assessment and management plan. Asha Grumbine, Carie Caddy, MD

## 2023-09-14 ENCOUNTER — Ambulatory Visit: Payer: Self-pay | Admitting: Infectious Disease

## 2023-09-15 ENCOUNTER — Telehealth: Payer: Self-pay | Admitting: Cardiology

## 2023-09-15 NOTE — Telephone Encounter (Signed)
 Returned call to pt-   Daughter states they have been in [place] since last Sunday with possible bowel obstructions. She also has infection in her bladder, which was preventing her from urinating. She needs a colonoscopy, but they are concerned about her aortic stenosis- making them question if it is possible. Daughter wanted to speak with Dr. Veryl Gottron about this. Advised daughter that while patient hospitalized in a different hospital system we aren't able to consult. If they are having concerns about heart interfering with treatment plan they need to consult cardiology.

## 2023-09-15 NOTE — Telephone Encounter (Signed)
 Pt has been in the hospital in [facility] since last Sunday with a possible bowel obstructions. Daughter is concerned about her having heart trouble and the doctors at the hospital are also concerned.

## 2023-09-17 ENCOUNTER — Telehealth: Payer: Self-pay | Admitting: Infectious Disease

## 2023-09-17 NOTE — Telephone Encounter (Signed)
 Amy Hickman's daughter, Betty Bruckner, requested to cancel pt's upcoming appt due to hospitalization. Betty Bruckner wanted Dr. Ernie Heal to know she was taken by ambulance and is facing major surgery. Tamara will reschedule in the future, as needed.

## 2023-09-20 ENCOUNTER — Ambulatory Visit: Admitting: Infectious Disease

## 2023-10-07 IMAGING — CR DG CHEST 2V
2 series · 2 of 2 positions shown · non-contrast
Comparison: 03/15/2021

CLINICAL DATA: Shortness of breath

EXAM:
CHEST - 2 VIEW

[w chest pa]
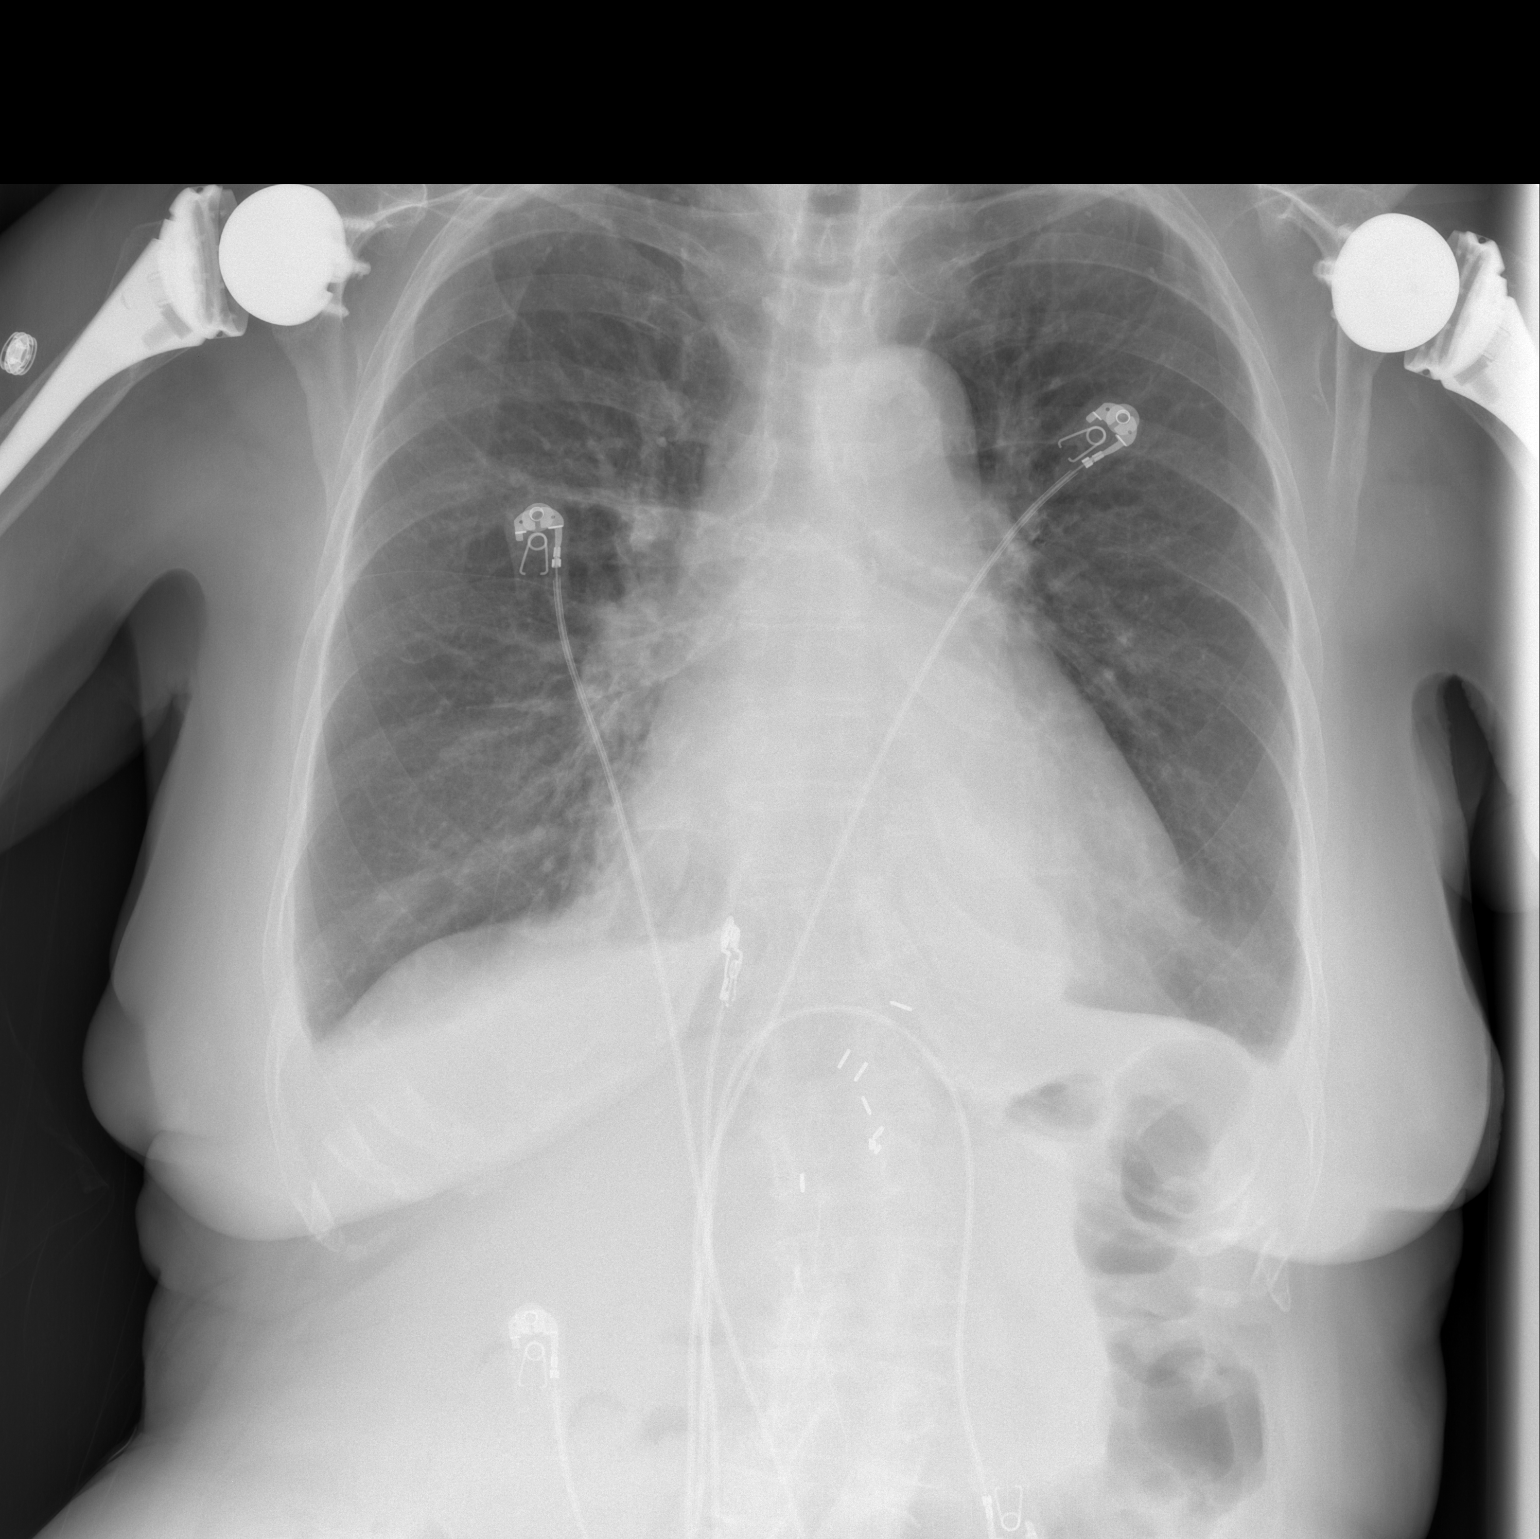

[w chest lat]
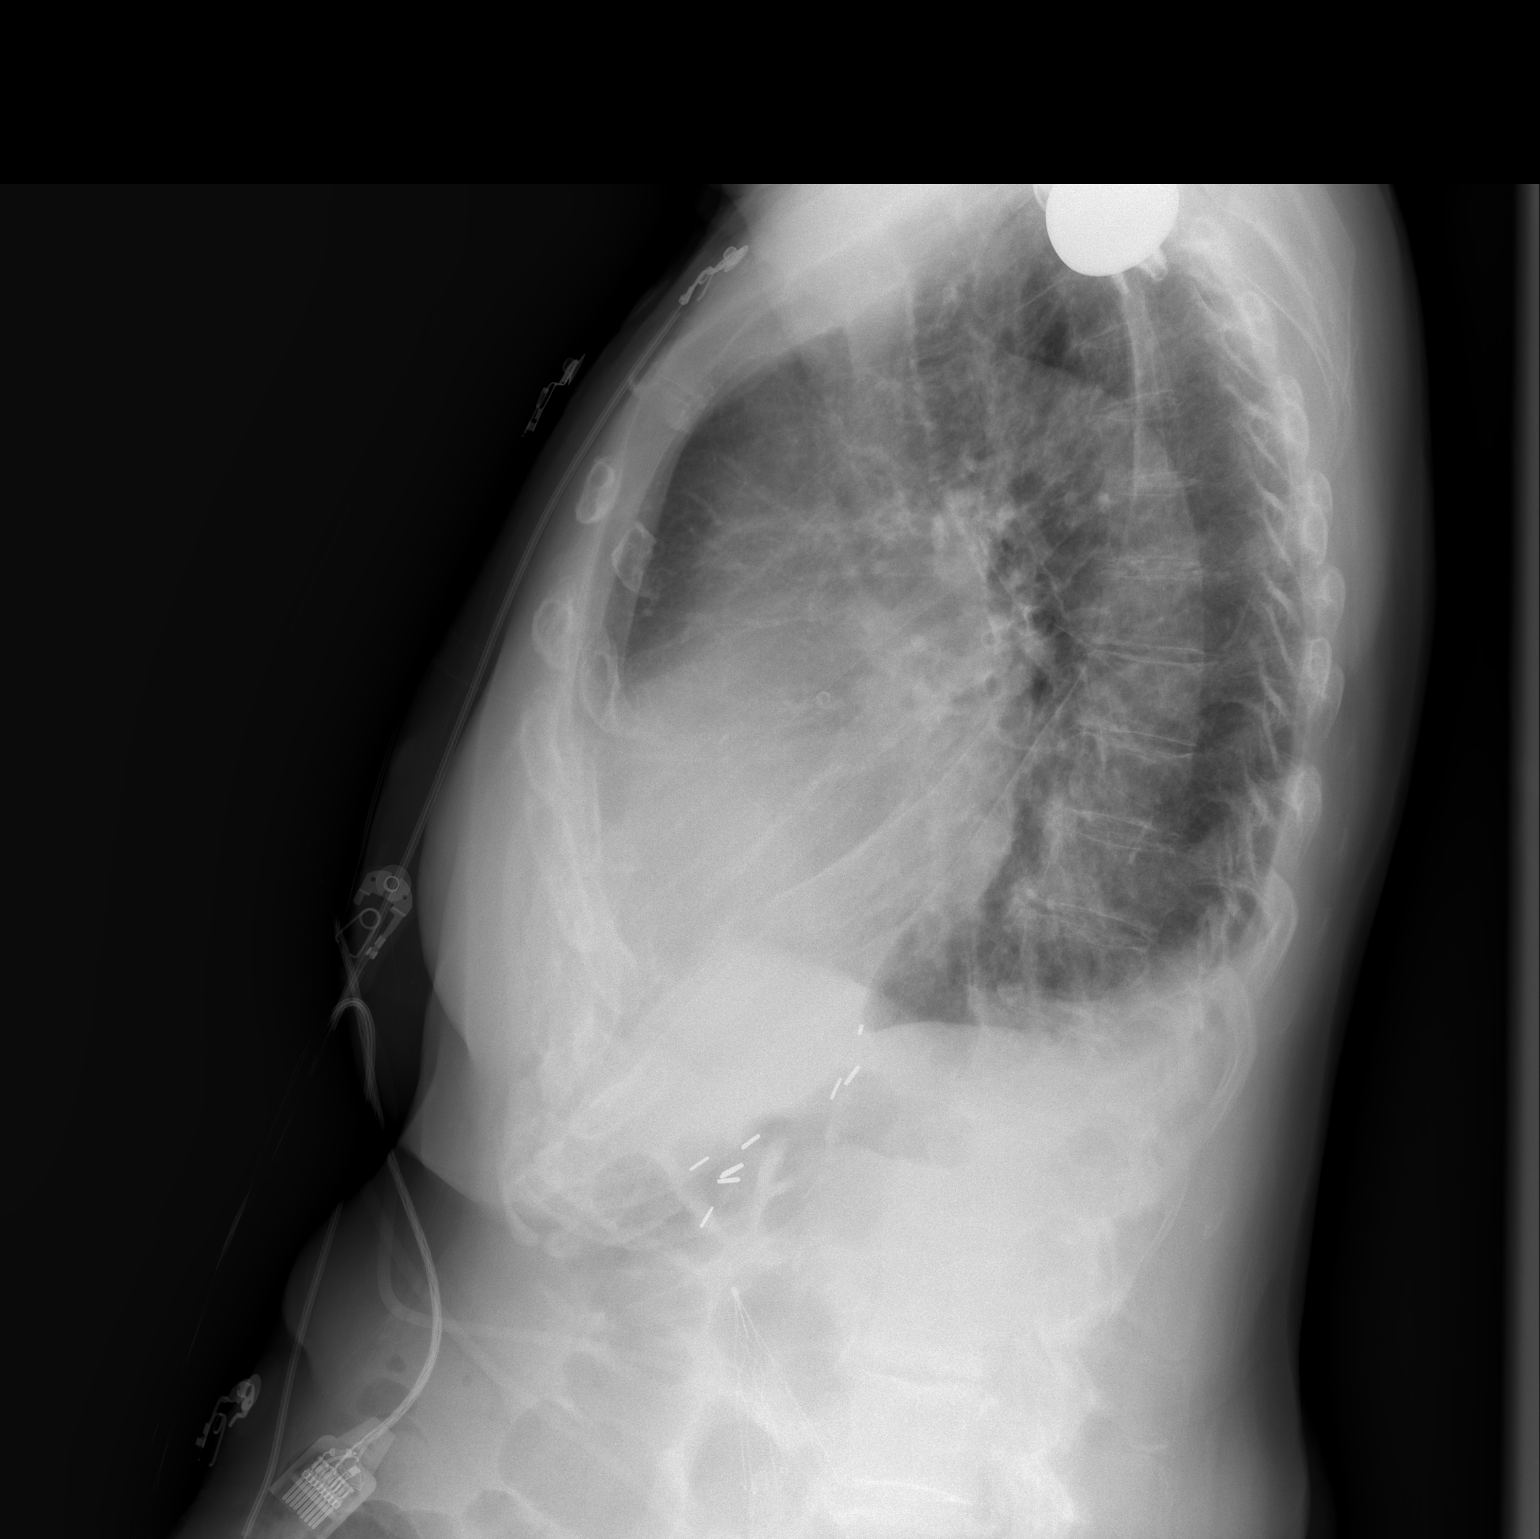

[2 of 2 positions shown; findings below may reference images not displayed]

FINDINGS: Enlargement of cardiac silhouette with pulmonary vascular
congestion.

Question small hiatal hernia.

Minimal LEFT basilar atelectasis and small bibasilar effusions.

No acute infiltrate or pneumothorax.

Osseous demineralization with BILATERAL shoulder prostheses.
IMPRESSION: Enlargement of cardiac silhouette with pulmonary vascular
congestion.

Small BIBASILAR effusions and minimal LEFT basilar atelectasis.

## 2023-10-14 ENCOUNTER — Ambulatory Visit: Admitting: Nurse Practitioner

## 2023-10-14 ENCOUNTER — Other Ambulatory Visit (HOSPITAL_COMMUNITY): Payer: Self-pay

## 2023-10-19 DEATH — deceased

## 2024-03-17 ENCOUNTER — Other Ambulatory Visit (HOSPITAL_BASED_OUTPATIENT_CLINIC_OR_DEPARTMENT_OTHER): Payer: Self-pay

## 2024-04-10 ENCOUNTER — Other Ambulatory Visit (HOSPITAL_COMMUNITY): Payer: Self-pay
